# Patient Record
Sex: Female | Born: 1950 | Race: White | Hispanic: No | Marital: Married | Smoking: Former smoker
Health system: Southern US, Community
[De-identification: ages and names within clinical notes are randomized; demographics above are authoritative.]

## PROBLEM LIST (undated history)

## (undated) DIAGNOSIS — N179 Acute kidney failure, unspecified: Secondary | ICD-10-CM

## (undated) DIAGNOSIS — I10 Essential (primary) hypertension: Secondary | ICD-10-CM

## (undated) DIAGNOSIS — F419 Anxiety disorder, unspecified: Secondary | ICD-10-CM

## (undated) DIAGNOSIS — F32A Depression, unspecified: Secondary | ICD-10-CM

## (undated) DIAGNOSIS — N1330 Unspecified hydronephrosis: Secondary | ICD-10-CM

## (undated) DIAGNOSIS — C801 Malignant (primary) neoplasm, unspecified: Secondary | ICD-10-CM

## (undated) DIAGNOSIS — F329 Major depressive disorder, single episode, unspecified: Secondary | ICD-10-CM

## (undated) DIAGNOSIS — D649 Anemia, unspecified: Secondary | ICD-10-CM

## (undated) DIAGNOSIS — C50919 Malignant neoplasm of unspecified site of unspecified female breast: Secondary | ICD-10-CM

## (undated) HISTORY — DX: Anxiety disorder, unspecified: F41.9

## (undated) HISTORY — PX: TONSILLECTOMY: SUR1361

## (undated) HISTORY — DX: Acute kidney failure, unspecified: N17.9

## (undated) HISTORY — PX: APPENDECTOMY: SHX54

## (undated) HISTORY — DX: Malignant neoplasm of unspecified site of unspecified female breast: C50.919

## (undated) HISTORY — PX: SKIN BIOPSY: SHX1

## (undated) HISTORY — DX: Anemia, unspecified: D64.9

---

## 1975-06-18 HISTORY — PX: CHOLECYSTECTOMY: SHX55

## 2005-12-12 ENCOUNTER — Encounter: Admission: RE | Admit: 2005-12-12 | Discharge: 2005-12-12 | Payer: Self-pay | Admitting: Family Medicine

## 2005-12-12 ENCOUNTER — Encounter (INDEPENDENT_AMBULATORY_CARE_PROVIDER_SITE_OTHER): Payer: Self-pay | Admitting: Specialist

## 2005-12-12 ENCOUNTER — Encounter (INDEPENDENT_AMBULATORY_CARE_PROVIDER_SITE_OTHER): Payer: Self-pay | Admitting: Diagnostic Radiology

## 2005-12-20 ENCOUNTER — Encounter: Admission: RE | Admit: 2005-12-20 | Discharge: 2005-12-20 | Payer: Self-pay | Admitting: General Surgery

## 2005-12-27 ENCOUNTER — Encounter (INDEPENDENT_AMBULATORY_CARE_PROVIDER_SITE_OTHER): Payer: Self-pay | Admitting: Diagnostic Radiology

## 2005-12-27 ENCOUNTER — Encounter: Admission: RE | Admit: 2005-12-27 | Discharge: 2005-12-27 | Payer: Self-pay | Admitting: General Surgery

## 2005-12-30 ENCOUNTER — Ambulatory Visit (HOSPITAL_COMMUNITY): Admission: RE | Admit: 2005-12-30 | Discharge: 2005-12-30 | Payer: Self-pay | Admitting: General Surgery

## 2006-01-15 ENCOUNTER — Encounter (INDEPENDENT_AMBULATORY_CARE_PROVIDER_SITE_OTHER): Payer: Self-pay | Admitting: *Deleted

## 2006-01-15 ENCOUNTER — Ambulatory Visit (HOSPITAL_COMMUNITY): Admission: RE | Admit: 2006-01-15 | Discharge: 2006-01-16 | Payer: Self-pay | Admitting: General Surgery

## 2006-01-15 HISTORY — PX: MASTECTOMY MODIFIED RADICAL: SUR848

## 2006-01-29 ENCOUNTER — Ambulatory Visit: Payer: Self-pay | Admitting: Oncology

## 2006-01-29 LAB — COMPREHENSIVE METABOLIC PANEL
ALT: 13 U/L (ref 0–40)
AST: 11 U/L (ref 0–37)
Albumin: 4.4 g/dL (ref 3.5–5.2)
Alkaline Phosphatase: 83 U/L (ref 39–117)
Potassium: 3.9 mEq/L (ref 3.5–5.3)
Sodium: 138 mEq/L (ref 135–145)
Total Bilirubin: 0.7 mg/dL (ref 0.3–1.2)
Total Protein: 6.6 g/dL (ref 6.0–8.3)

## 2006-01-29 LAB — CBC WITH DIFFERENTIAL/PLATELET
EOS%: 1.9 % (ref 0.0–7.0)
HGB: 12 g/dL (ref 11.6–15.9)
MCH: 30.2 pg (ref 26.0–34.0)
MCV: 89 fL (ref 81.0–101.0)
MONO%: 6.9 % (ref 0.0–13.0)
NEUT#: 5.3 10*3/uL (ref 1.5–6.5)
RBC: 3.98 10*6/uL (ref 3.70–5.32)
RDW: 13.1 % (ref 11.3–14.5)
lymph#: 1.5 10*3/uL (ref 0.9–3.3)

## 2006-01-29 LAB — CANCER ANTIGEN 27.29: CA 27.29: 25 U/mL (ref 0–39)

## 2006-02-11 LAB — COMPREHENSIVE METABOLIC PANEL
Alkaline Phosphatase: 75 U/L (ref 39–117)
BUN: 14 mg/dL (ref 6–23)
Creatinine, Ser: 0.97 mg/dL (ref 0.40–1.20)
Glucose, Bld: 111 mg/dL — ABNORMAL HIGH (ref 70–99)
Sodium: 139 mEq/L (ref 135–145)
Total Bilirubin: 0.7 mg/dL (ref 0.3–1.2)

## 2006-02-11 LAB — CBC WITH DIFFERENTIAL/PLATELET
Basophils Absolute: 0 10*3/uL (ref 0.0–0.1)
Eosinophils Absolute: 0.1 10*3/uL (ref 0.0–0.5)
HCT: 35 % (ref 34.8–46.6)
LYMPH%: 25.6 % (ref 14.0–48.0)
MCV: 88.7 fL (ref 81.0–101.0)
MONO%: 9.4 % (ref 0.0–13.0)
NEUT#: 4 10*3/uL (ref 1.5–6.5)
NEUT%: 62.1 % (ref 39.6–76.8)
Platelets: 265 10*3/uL (ref 145–400)
RBC: 3.95 10*6/uL (ref 3.70–5.32)

## 2006-02-11 LAB — CANCER ANTIGEN 27.29: CA 27.29: 19 U/mL (ref 0–39)

## 2006-02-11 LAB — LACTATE DEHYDROGENASE: LDH: 142 U/L (ref 94–250)

## 2006-02-13 ENCOUNTER — Ambulatory Visit (HOSPITAL_COMMUNITY): Admission: RE | Admit: 2006-02-13 | Discharge: 2006-02-13 | Payer: Self-pay | Admitting: Oncology

## 2006-02-20 ENCOUNTER — Encounter (INDEPENDENT_AMBULATORY_CARE_PROVIDER_SITE_OTHER): Payer: Self-pay | Admitting: *Deleted

## 2006-02-20 ENCOUNTER — Ambulatory Visit: Admission: RE | Admit: 2006-02-20 | Discharge: 2006-02-20 | Payer: Self-pay | Admitting: Oncology

## 2006-02-24 LAB — CBC WITH DIFFERENTIAL/PLATELET
Basophils Absolute: 0.1 10*3/uL (ref 0.0–0.1)
HCT: 37.1 % (ref 34.8–46.6)
HGB: 12.7 g/dL (ref 11.6–15.9)
MONO#: 0.5 10*3/uL (ref 0.1–0.9)
NEUT#: 3.3 10*3/uL (ref 1.5–6.5)
NEUT%: 61.1 % (ref 39.6–76.8)
RDW: 12.1 % (ref 11.3–14.5)
WBC: 5.4 10*3/uL (ref 3.9–10.0)
lymph#: 1.4 10*3/uL (ref 0.9–3.3)

## 2006-02-25 ENCOUNTER — Ambulatory Visit: Payer: Self-pay | Admitting: Oncology

## 2006-02-28 ENCOUNTER — Ambulatory Visit (HOSPITAL_COMMUNITY): Admission: RE | Admit: 2006-02-28 | Discharge: 2006-02-28 | Payer: Self-pay | Admitting: Oncology

## 2006-03-04 LAB — CBC WITH DIFFERENTIAL/PLATELET
Basophils Absolute: 0.1 10*3/uL (ref 0.0–0.1)
Eosinophils Absolute: 0.1 10*3/uL (ref 0.0–0.5)
HCT: 36.1 % (ref 34.8–46.6)
HGB: 13 g/dL (ref 11.6–15.9)
LYMPH%: 8.4 % — ABNORMAL LOW (ref 14.0–48.0)
MCV: 85.8 fL (ref 81.0–101.0)
MONO#: 0.4 10*3/uL (ref 0.1–0.9)
MONO%: 1.6 % (ref 0.0–13.0)
NEUT#: 23.7 10*3/uL — ABNORMAL HIGH (ref 1.5–6.5)
NEUT%: 89.4 % — ABNORMAL HIGH (ref 39.6–76.8)
Platelets: 242 10*3/uL (ref 145–400)
WBC: 26.6 10*3/uL — ABNORMAL HIGH (ref 3.9–10.0)

## 2006-03-13 ENCOUNTER — Ambulatory Visit: Payer: Self-pay | Admitting: Oncology

## 2006-03-17 LAB — CBC WITH DIFFERENTIAL/PLATELET
BASO%: 1.6 % (ref 0.0–2.0)
Basophils Absolute: 0.1 10*3/uL (ref 0.0–0.1)
EOS%: 0.8 % (ref 0.0–7.0)
HCT: 32.9 % — ABNORMAL LOW (ref 34.8–46.6)
HGB: 11.4 g/dL — ABNORMAL LOW (ref 11.6–15.9)
LYMPH%: 24.8 % (ref 14.0–48.0)
MCH: 30.7 pg (ref 26.0–34.0)
MCHC: 34.7 g/dL (ref 32.0–36.0)
MCV: 88.6 fL (ref 81.0–101.0)
NEUT%: 62.7 % (ref 39.6–76.8)
Platelets: 290 10*3/uL (ref 145–400)

## 2006-04-14 LAB — CBC WITH DIFFERENTIAL/PLATELET
BASO%: 2.1 % — ABNORMAL HIGH (ref 0.0–2.0)
Basophils Absolute: 0.1 10*3/uL (ref 0.0–0.1)
EOS%: 0.8 % (ref 0.0–7.0)
HGB: 11.3 g/dL — ABNORMAL LOW (ref 11.6–15.9)
MCH: 30.9 pg (ref 26.0–34.0)
MCHC: 35.7 g/dL (ref 32.0–36.0)
MCV: 86.4 fL (ref 81.0–101.0)
MONO%: 25.5 % — ABNORMAL HIGH (ref 0.0–13.0)
NEUT%: 43.8 % (ref 39.6–76.8)
RDW: 13.2 % (ref 11.3–14.5)
lymph#: 1 10*3/uL (ref 0.9–3.3)

## 2006-04-24 ENCOUNTER — Ambulatory Visit: Payer: Self-pay | Admitting: Oncology

## 2006-04-28 LAB — CBC WITH DIFFERENTIAL/PLATELET
BASO%: 1.2 % (ref 0.0–2.0)
EOS%: 0.4 % (ref 0.0–7.0)
HCT: 27.6 % — ABNORMAL LOW (ref 34.8–46.6)
LYMPH%: 19.8 % (ref 14.0–48.0)
MCH: 31.5 pg (ref 26.0–34.0)
MCHC: 35.3 g/dL (ref 32.0–36.0)
NEUT%: 64.9 % (ref 39.6–76.8)
lymph#: 0.9 10*3/uL (ref 0.9–3.3)

## 2006-04-28 LAB — COMPREHENSIVE METABOLIC PANEL
ALT: 35 U/L (ref 0–35)
AST: 26 U/L (ref 0–37)
Chloride: 103 mEq/L (ref 96–112)
Creatinine, Ser: 0.96 mg/dL (ref 0.40–1.20)
Total Bilirubin: 0.8 mg/dL (ref 0.3–1.2)

## 2006-04-29 ENCOUNTER — Ambulatory Visit: Payer: Self-pay | Admitting: Oncology

## 2006-05-05 LAB — CBC WITH DIFFERENTIAL/PLATELET
Basophils Absolute: 0 10*3/uL (ref 0.0–0.1)
Eosinophils Absolute: 0 10*3/uL (ref 0.0–0.5)
HCT: 27.2 % — ABNORMAL LOW (ref 34.8–46.6)
LYMPH%: 28.3 % (ref 14.0–48.0)
MCV: 92.4 fL (ref 81.0–101.0)
MONO#: 1 10*3/uL — ABNORMAL HIGH (ref 0.1–0.9)
NEUT#: 2.7 10*3/uL (ref 1.5–6.5)
NEUT%: 51.4 % (ref 39.6–76.8)
Platelets: 147 10*3/uL (ref 145–400)
WBC: 5.2 10*3/uL (ref 3.9–10.0)

## 2006-05-12 LAB — CBC WITH DIFFERENTIAL/PLATELET
BASO%: 0.8 % (ref 0.0–2.0)
HCT: 28.1 % — ABNORMAL LOW (ref 34.8–46.6)
LYMPH%: 16.7 % (ref 14.0–48.0)
MCH: 31.9 pg (ref 26.0–34.0)
MCHC: 33.6 g/dL (ref 32.0–36.0)
MCV: 95 fL (ref 81.0–101.0)
MONO#: 0.7 10*3/uL (ref 0.1–0.9)
MONO%: 10 % (ref 0.0–13.0)
NEUT%: 72.2 % (ref 39.6–76.8)
Platelets: 126 10*3/uL — ABNORMAL LOW (ref 145–400)

## 2006-05-19 LAB — CBC WITH DIFFERENTIAL/PLATELET
BASO%: 0.6 % (ref 0.0–2.0)
Eosinophils Absolute: 0 10*3/uL (ref 0.0–0.5)
HCT: 29.7 % — ABNORMAL LOW (ref 34.8–46.6)
MCHC: 33.7 g/dL (ref 32.0–36.0)
MONO#: 0.3 10*3/uL (ref 0.1–0.9)
NEUT#: 3.3 10*3/uL (ref 1.5–6.5)
Platelets: 122 10*3/uL — ABNORMAL LOW (ref 145–400)
RBC: 3.04 10*6/uL — ABNORMAL LOW (ref 3.70–5.32)
WBC: 4.9 10*3/uL (ref 3.9–10.0)
lymph#: 1.2 10*3/uL (ref 0.9–3.3)

## 2006-05-19 LAB — COMPREHENSIVE METABOLIC PANEL
ALT: 14 U/L (ref 0–35)
Albumin: 3.8 g/dL (ref 3.5–5.2)
CO2: 24 mEq/L (ref 19–32)
Calcium: 8.4 mg/dL (ref 8.4–10.5)
Chloride: 101 mEq/L (ref 96–112)
Glucose, Bld: 133 mg/dL — ABNORMAL HIGH (ref 70–99)
Sodium: 138 mEq/L (ref 135–145)
Total Protein: 5.7 g/dL — ABNORMAL LOW (ref 6.0–8.3)

## 2006-05-19 LAB — VITAMIN B12: Vitamin B-12: 1987 pg/mL — ABNORMAL HIGH (ref 211–911)

## 2006-05-26 LAB — CBC WITH DIFFERENTIAL/PLATELET
BASO%: 0.5 % (ref 0.0–2.0)
EOS%: 2 % (ref 0.0–7.0)
HCT: 29.4 % — ABNORMAL LOW (ref 34.8–46.6)
MCH: 33.8 pg (ref 26.0–34.0)
MCHC: 33.8 g/dL (ref 32.0–36.0)
MONO#: 0.4 10*3/uL (ref 0.1–0.9)
NEUT%: 58.2 % (ref 39.6–76.8)
RBC: 2.94 10*6/uL — ABNORMAL LOW (ref 3.70–5.32)
RDW: 21.3 % — ABNORMAL HIGH (ref 11.3–14.5)
WBC: 3.5 10*3/uL — ABNORMAL LOW (ref 3.9–10.0)
lymph#: 1 10*3/uL (ref 0.9–3.3)

## 2006-06-04 ENCOUNTER — Ambulatory Visit: Admission: RE | Admit: 2006-06-04 | Discharge: 2006-08-14 | Payer: Self-pay | Admitting: Radiation Oncology

## 2006-06-11 ENCOUNTER — Ambulatory Visit: Payer: Self-pay | Admitting: Oncology

## 2006-06-12 LAB — CBC WITH DIFFERENTIAL/PLATELET
Basophils Absolute: 0 10*3/uL (ref 0.0–0.1)
Eosinophils Absolute: 0.1 10*3/uL (ref 0.0–0.5)
HGB: 10.7 g/dL — ABNORMAL LOW (ref 11.6–15.9)
MONO#: 0.4 10*3/uL (ref 0.1–0.9)
NEUT#: 2 10*3/uL (ref 1.5–6.5)
Platelets: 237 10*3/uL (ref 145–400)
RBC: 3.27 10*6/uL — ABNORMAL LOW (ref 3.70–5.32)
RDW: 13 % (ref 11.3–14.5)
WBC: 3.9 10*3/uL (ref 3.9–10.0)

## 2006-06-12 LAB — COMPREHENSIVE METABOLIC PANEL
ALT: 36 U/L — ABNORMAL HIGH (ref 0–35)
AST: 26 U/L (ref 0–37)
Calcium: 8.5 mg/dL (ref 8.4–10.5)
Chloride: 104 mEq/L (ref 96–112)
Creatinine, Ser: 0.88 mg/dL (ref 0.40–1.20)
Sodium: 142 mEq/L (ref 135–145)
Total Protein: 5.8 g/dL — ABNORMAL LOW (ref 6.0–8.3)

## 2006-07-03 LAB — CBC WITH DIFFERENTIAL/PLATELET
BASO%: 1.1 % (ref 0.0–2.0)
HCT: 34 % — ABNORMAL LOW (ref 34.8–46.6)
LYMPH%: 20.7 % (ref 14.0–48.0)
MCHC: 34.8 g/dL (ref 32.0–36.0)
MCV: 94.4 fL (ref 81.0–101.0)
MONO#: 0.4 10*3/uL (ref 0.1–0.9)
MONO%: 8.2 % (ref 0.0–13.0)
NEUT%: 66.6 % (ref 39.6–76.8)
Platelets: 259 10*3/uL (ref 145–400)
RBC: 3.6 10*6/uL — ABNORMAL LOW (ref 3.70–5.32)
WBC: 4.7 10*3/uL (ref 3.9–10.0)

## 2006-07-18 LAB — COMPREHENSIVE METABOLIC PANEL
ALT: 22 U/L (ref 0–35)
AST: 18 U/L (ref 0–37)
Alkaline Phosphatase: 82 U/L (ref 39–117)
BUN: 15 mg/dL (ref 6–23)
Creatinine, Ser: 1.02 mg/dL (ref 0.40–1.20)
Total Bilirubin: 0.8 mg/dL (ref 0.3–1.2)

## 2006-07-18 LAB — CANCER ANTIGEN 27.29: CA 27.29: 29 U/mL (ref 0–39)

## 2006-07-18 LAB — CBC WITH DIFFERENTIAL/PLATELET
BASO%: 0.7 % (ref 0.0–2.0)
EOS%: 5.5 % (ref 0.0–7.0)
HCT: 33.1 % — ABNORMAL LOW (ref 34.8–46.6)
LYMPH%: 18 % (ref 14.0–48.0)
MCH: 33.2 pg (ref 26.0–34.0)
MCHC: 35.5 g/dL (ref 32.0–36.0)
NEUT%: 62.6 % (ref 39.6–76.8)
Platelets: 192 10*3/uL (ref 145–400)
RBC: 3.55 10*6/uL — ABNORMAL LOW (ref 3.70–5.32)
WBC: 2.9 10*3/uL — ABNORMAL LOW (ref 3.9–10.0)
lymph#: 0.5 10*3/uL — ABNORMAL LOW (ref 0.9–3.3)

## 2006-07-21 ENCOUNTER — Ambulatory Visit: Payer: Self-pay | Admitting: Oncology

## 2006-07-22 ENCOUNTER — Ambulatory Visit (HOSPITAL_COMMUNITY): Admission: RE | Admit: 2006-07-22 | Discharge: 2006-07-22 | Payer: Self-pay | Admitting: Oncology

## 2006-07-29 ENCOUNTER — Ambulatory Visit: Admission: RE | Admit: 2006-07-29 | Discharge: 2006-07-29 | Payer: Self-pay | Admitting: Oncology

## 2006-07-29 ENCOUNTER — Encounter (INDEPENDENT_AMBULATORY_CARE_PROVIDER_SITE_OTHER): Payer: Self-pay | Admitting: Cardiology

## 2006-08-01 LAB — ESTRADIOL, ULTRA SENS: Estradiol, Ultra Sensitive: 13 pg/mL

## 2006-09-02 ENCOUNTER — Ambulatory Visit: Payer: Self-pay | Admitting: Oncology

## 2006-09-25 LAB — CBC WITH DIFFERENTIAL/PLATELET
BASO%: 0.9 % (ref 0.0–2.0)
EOS%: 4.5 % (ref 0.0–7.0)
HCT: 32.2 % — ABNORMAL LOW (ref 34.8–46.6)
LYMPH%: 19 % (ref 14.0–48.0)
MCH: 31 pg (ref 26.0–34.0)
MCHC: 34.9 g/dL (ref 32.0–36.0)
MONO#: 0.3 10*3/uL (ref 0.1–0.9)
NEUT%: 66.7 % (ref 39.6–76.8)
Platelets: 280 10*3/uL (ref 145–400)
RBC: 3.62 10*6/uL — ABNORMAL LOW (ref 3.70–5.32)
WBC: 3.8 10*3/uL — ABNORMAL LOW (ref 3.9–10.0)

## 2006-09-25 LAB — FOLLICLE STIMULATING HORMONE: FSH: 75.9 m[IU]/mL

## 2006-10-14 ENCOUNTER — Ambulatory Visit: Payer: Self-pay | Admitting: Oncology

## 2006-10-16 LAB — CBC WITH DIFFERENTIAL/PLATELET
BASO%: 0.9 % (ref 0.0–2.0)
EOS%: 6.8 % (ref 0.0–7.0)
HCT: 33.3 % — ABNORMAL LOW (ref 34.8–46.6)
MCH: 30.5 pg (ref 26.0–34.0)
MCHC: 34.3 g/dL (ref 32.0–36.0)
NEUT%: 46.7 % (ref 39.6–76.8)
RBC: 3.75 10*6/uL (ref 3.70–5.32)
lymph#: 0.7 10*3/uL — ABNORMAL LOW (ref 0.9–3.3)

## 2006-11-11 ENCOUNTER — Ambulatory Visit: Admission: RE | Admit: 2006-11-11 | Discharge: 2006-11-11 | Payer: Self-pay | Admitting: Oncology

## 2006-11-11 ENCOUNTER — Encounter: Payer: Self-pay | Admitting: Oncology

## 2006-12-15 ENCOUNTER — Encounter: Admission: RE | Admit: 2006-12-15 | Discharge: 2006-12-15 | Payer: Self-pay | Admitting: Oncology

## 2006-12-16 ENCOUNTER — Ambulatory Visit: Payer: Self-pay | Admitting: Oncology

## 2006-12-18 LAB — FOLLICLE STIMULATING HORMONE: FSH: 90.8 m[IU]/mL

## 2007-01-28 ENCOUNTER — Ambulatory Visit: Payer: Self-pay | Admitting: Oncology

## 2007-01-29 LAB — CBC WITH DIFFERENTIAL/PLATELET
Eosinophils Absolute: 0.2 10*3/uL (ref 0.0–0.5)
HCT: 33.7 % — ABNORMAL LOW (ref 34.8–46.6)
LYMPH%: 25.2 % (ref 14.0–48.0)
MONO#: 0.4 10*3/uL (ref 0.1–0.9)
NEUT#: 2.7 10*3/uL (ref 1.5–6.5)
NEUT%: 60.8 % (ref 39.6–76.8)
Platelets: 241 10*3/uL (ref 145–400)
RBC: 3.82 10*6/uL (ref 3.70–5.32)
WBC: 4.4 10*3/uL (ref 3.9–10.0)
lymph#: 1.1 10*3/uL (ref 0.9–3.3)

## 2007-01-30 LAB — FOLLICLE STIMULATING HORMONE: FSH: 99.5 m[IU]/mL

## 2007-02-09 ENCOUNTER — Ambulatory Visit (HOSPITAL_COMMUNITY): Admission: RE | Admit: 2007-02-09 | Discharge: 2007-02-09 | Payer: Self-pay | Admitting: Oncology

## 2007-02-09 LAB — CBC WITH DIFFERENTIAL/PLATELET
Basophils Absolute: 0 10*3/uL (ref 0.0–0.1)
Eosinophils Absolute: 0.2 10*3/uL (ref 0.0–0.5)
HGB: 11.9 g/dL (ref 11.6–15.9)
MCV: 89.1 fL (ref 81.0–101.0)
MONO#: 0.4 10*3/uL (ref 0.1–0.9)
MONO%: 8.3 % (ref 0.0–13.0)
NEUT#: 2.8 10*3/uL (ref 1.5–6.5)
RBC: 3.72 10*6/uL (ref 3.70–5.32)
RDW: 13.6 % (ref 11.3–14.5)
WBC: 4.6 10*3/uL (ref 3.9–10.0)
lymph#: 1.3 10*3/uL (ref 0.9–3.3)

## 2007-02-09 LAB — COMPREHENSIVE METABOLIC PANEL
Albumin: 4.5 g/dL (ref 3.5–5.2)
BUN: 16 mg/dL (ref 6–23)
CO2: 25 mEq/L (ref 19–32)
Calcium: 10 mg/dL (ref 8.4–10.5)
Chloride: 100 mEq/L (ref 96–112)
Glucose, Bld: 99 mg/dL (ref 70–99)
Potassium: 4.1 mEq/L (ref 3.5–5.3)
Sodium: 139 mEq/L (ref 135–145)
Total Protein: 7.3 g/dL (ref 6.0–8.3)

## 2007-02-09 LAB — CANCER ANTIGEN 27.29: CA 27.29: 25 U/mL (ref 0–39)

## 2007-02-09 LAB — FOLLICLE STIMULATING HORMONE: FSH: 96.1 m[IU]/mL

## 2007-02-10 ENCOUNTER — Ambulatory Visit: Admission: RE | Admit: 2007-02-10 | Discharge: 2007-02-10 | Payer: Self-pay | Admitting: Oncology

## 2007-02-10 ENCOUNTER — Encounter: Payer: Self-pay | Admitting: Oncology

## 2007-02-19 LAB — ESTRADIOL, ULTRA SENS: Estradiol, Ultra Sensitive: 5 pg/mL

## 2007-04-06 ENCOUNTER — Ambulatory Visit (HOSPITAL_BASED_OUTPATIENT_CLINIC_OR_DEPARTMENT_OTHER): Admission: RE | Admit: 2007-04-06 | Discharge: 2007-04-06 | Payer: Self-pay | Admitting: General Surgery

## 2007-08-05 ENCOUNTER — Ambulatory Visit: Payer: Self-pay | Admitting: Oncology

## 2007-08-10 LAB — CBC WITH DIFFERENTIAL/PLATELET
Basophils Absolute: 0 10*3/uL (ref 0.0–0.1)
EOS%: 3.7 % (ref 0.0–7.0)
Eosinophils Absolute: 0.2 10*3/uL (ref 0.0–0.5)
HGB: 12.9 g/dL (ref 11.6–15.9)
MONO#: 0.5 10*3/uL (ref 0.1–0.9)
NEUT#: 2.9 10*3/uL (ref 1.5–6.5)
RDW: 13.4 % (ref 11.3–14.5)
WBC: 5.3 10*3/uL (ref 3.9–10.0)
lymph#: 1.7 10*3/uL (ref 0.9–3.3)

## 2007-08-10 LAB — COMPREHENSIVE METABOLIC PANEL
AST: 20 U/L (ref 0–37)
Albumin: 4.4 g/dL (ref 3.5–5.2)
BUN: 14 mg/dL (ref 6–23)
CO2: 22 mEq/L (ref 19–32)
Calcium: 9.4 mg/dL (ref 8.4–10.5)
Chloride: 105 mEq/L (ref 96–112)
Glucose, Bld: 121 mg/dL — ABNORMAL HIGH (ref 70–99)
Potassium: 4 mEq/L (ref 3.5–5.3)

## 2007-08-25 ENCOUNTER — Ambulatory Visit: Payer: Self-pay | Admitting: Oncology

## 2007-09-07 LAB — ESTRADIOL, ULTRA SENS: Estradiol, Ultra Sensitive: 8 pg/mL

## 2007-12-17 ENCOUNTER — Encounter: Admission: RE | Admit: 2007-12-17 | Discharge: 2007-12-17 | Payer: Self-pay | Admitting: Oncology

## 2008-02-19 ENCOUNTER — Ambulatory Visit: Payer: Self-pay | Admitting: Oncology

## 2008-02-25 ENCOUNTER — Ambulatory Visit (HOSPITAL_COMMUNITY): Admission: RE | Admit: 2008-02-25 | Discharge: 2008-02-25 | Payer: Self-pay | Admitting: Oncology

## 2008-02-25 LAB — CBC WITH DIFFERENTIAL/PLATELET
Basophils Absolute: 0 10*3/uL (ref 0.0–0.1)
Eosinophils Absolute: 0.1 10*3/uL (ref 0.0–0.5)
HGB: 12.4 g/dL (ref 11.6–15.9)
MCV: 89.9 fL (ref 81.0–101.0)
MONO#: 0.3 10*3/uL (ref 0.1–0.9)
NEUT#: 2.3 10*3/uL (ref 1.5–6.5)
RDW: 13.9 % (ref 11.3–14.5)
WBC: 4 10*3/uL (ref 3.9–10.0)
lymph#: 1.2 10*3/uL (ref 0.9–3.3)

## 2008-02-25 LAB — COMPREHENSIVE METABOLIC PANEL
Albumin: 3.5 g/dL (ref 3.5–5.2)
BUN: 11 mg/dL (ref 6–23)
Calcium: 9.6 mg/dL (ref 8.4–10.5)
Chloride: 106 mEq/L (ref 96–112)
Glucose, Bld: 115 mg/dL — ABNORMAL HIGH (ref 70–99)
Potassium: 4.3 mEq/L (ref 3.5–5.3)
Sodium: 140 mEq/L (ref 135–145)
Total Protein: 6.5 g/dL (ref 6.0–8.3)

## 2008-02-25 LAB — CANCER ANTIGEN 27.29: CA 27.29: 21 U/mL (ref 0–39)

## 2008-09-01 ENCOUNTER — Ambulatory Visit: Payer: Self-pay | Admitting: Oncology

## 2008-09-23 LAB — CBC WITH DIFFERENTIAL/PLATELET
BASO%: 0.6 % (ref 0.0–2.0)
EOS%: 2.8 % (ref 0.0–7.0)
HCT: 39.2 % (ref 34.8–46.6)
MCH: 30.4 pg (ref 25.1–34.0)
MCHC: 34.3 g/dL (ref 31.5–36.0)
MONO#: 0.2 10*3/uL (ref 0.1–0.9)
RBC: 4.43 10*6/uL (ref 3.70–5.45)
RDW: 13.4 % (ref 11.2–14.5)
WBC: 4.9 10*3/uL (ref 3.9–10.3)
lymph#: 1.4 10*3/uL (ref 0.9–3.3)

## 2008-09-26 LAB — COMPREHENSIVE METABOLIC PANEL
ALT: 25 U/L (ref 0–35)
AST: 19 U/L (ref 0–37)
CO2: 25 mEq/L (ref 19–32)
Calcium: 9.8 mg/dL (ref 8.4–10.5)
Chloride: 101 mEq/L (ref 96–112)
Creatinine, Ser: 1.04 mg/dL (ref 0.40–1.20)
Potassium: 4.3 mEq/L (ref 3.5–5.3)
Sodium: 138 mEq/L (ref 135–145)
Total Protein: 6.9 g/dL (ref 6.0–8.3)

## 2008-10-02 LAB — ESTRADIOL, ULTRA SENS

## 2008-12-20 ENCOUNTER — Encounter: Admission: RE | Admit: 2008-12-20 | Discharge: 2008-12-20 | Payer: Self-pay | Admitting: Oncology

## 2009-02-21 ENCOUNTER — Ambulatory Visit: Payer: Self-pay | Admitting: Oncology

## 2009-02-23 LAB — CBC WITH DIFFERENTIAL/PLATELET
Basophils Absolute: 0 10*3/uL (ref 0.0–0.1)
EOS%: 1.8 % (ref 0.0–7.0)
HCT: 36.6 % (ref 34.8–46.6)
HGB: 12.6 g/dL (ref 11.6–15.9)
LYMPH%: 23.7 % (ref 14.0–49.7)
MCH: 30.8 pg (ref 25.1–34.0)
MCV: 89.6 fL (ref 79.5–101.0)
MONO%: 6.4 % (ref 0.0–14.0)
NEUT%: 67.4 % (ref 38.4–76.8)
Platelets: 214 10*3/uL (ref 145–400)
RDW: 13.3 % (ref 11.2–14.5)

## 2009-02-24 LAB — COMPREHENSIVE METABOLIC PANEL
AST: 16 U/L (ref 0–37)
Alkaline Phosphatase: 140 U/L — ABNORMAL HIGH (ref 39–117)
BUN: 20 mg/dL (ref 6–23)
Creatinine, Ser: 1.24 mg/dL — ABNORMAL HIGH (ref 0.40–1.20)
Total Bilirubin: 0.8 mg/dL (ref 0.3–1.2)

## 2009-02-24 LAB — VITAMIN D 25 HYDROXY (VIT D DEFICIENCY, FRACTURES): Vit D, 25-Hydroxy: 26 ng/mL — ABNORMAL LOW (ref 30–89)

## 2009-02-24 LAB — FOLLICLE STIMULATING HORMONE: FSH: 82.8 m[IU]/mL

## 2009-02-24 LAB — CANCER ANTIGEN 27.29: CA 27.29: 30 U/mL (ref 0–39)

## 2009-03-03 LAB — ESTRADIOL, ULTRA SENS

## 2009-08-24 ENCOUNTER — Ambulatory Visit: Payer: Self-pay | Admitting: Oncology

## 2009-08-28 LAB — CBC WITH DIFFERENTIAL/PLATELET
BASO%: 0.8 % (ref 0.0–2.0)
EOS%: 2.9 % (ref 0.0–7.0)
HCT: 39.1 % (ref 34.8–46.6)
LYMPH%: 38.1 % (ref 14.0–49.7)
MCH: 31.4 pg (ref 25.1–34.0)
MCHC: 34.4 g/dL (ref 31.5–36.0)
MONO%: 9.7 % (ref 0.0–14.0)
NEUT%: 48.5 % (ref 38.4–76.8)
Platelets: 243 10*3/uL (ref 145–400)
RBC: 4.28 10*6/uL (ref 3.70–5.45)

## 2009-08-28 LAB — COMPREHENSIVE METABOLIC PANEL
ALT: 21 U/L (ref 0–35)
AST: 17 U/L (ref 0–37)
Alkaline Phosphatase: 120 U/L — ABNORMAL HIGH (ref 39–117)
Creatinine, Ser: 1.01 mg/dL (ref 0.40–1.20)
Sodium: 139 mEq/L (ref 135–145)
Total Bilirubin: 0.8 mg/dL (ref 0.3–1.2)

## 2009-08-28 LAB — CANCER ANTIGEN 27.29: CA 27.29: 29 U/mL (ref 0–39)

## 2010-01-08 ENCOUNTER — Encounter: Admission: RE | Admit: 2010-01-08 | Discharge: 2010-01-08 | Payer: Self-pay | Admitting: Oncology

## 2010-02-23 ENCOUNTER — Ambulatory Visit: Payer: Self-pay | Admitting: Oncology

## 2010-07-08 ENCOUNTER — Encounter: Payer: Self-pay | Admitting: Oncology

## 2010-07-09 ENCOUNTER — Encounter: Payer: Self-pay | Admitting: Oncology

## 2010-08-29 ENCOUNTER — Encounter (HOSPITAL_BASED_OUTPATIENT_CLINIC_OR_DEPARTMENT_OTHER): Payer: PRIVATE HEALTH INSURANCE | Admitting: Oncology

## 2010-08-29 ENCOUNTER — Other Ambulatory Visit: Payer: Self-pay | Admitting: Oncology

## 2010-08-29 DIAGNOSIS — C50919 Malignant neoplasm of unspecified site of unspecified female breast: Secondary | ICD-10-CM

## 2010-08-29 DIAGNOSIS — Z170 Estrogen receptor positive status [ER+]: Secondary | ICD-10-CM

## 2010-08-29 LAB — CBC WITH DIFFERENTIAL/PLATELET
Basophils Absolute: 0 10*3/uL (ref 0.0–0.1)
Eosinophils Absolute: 0.2 10*3/uL (ref 0.0–0.5)
HCT: 36.4 % (ref 34.8–46.6)
HGB: 12.5 g/dL (ref 11.6–15.9)
MCV: 88.6 fL (ref 79.5–101.0)
MONO%: 11.3 % (ref 0.0–14.0)
NEUT#: 2.7 10*3/uL (ref 1.5–6.5)
NEUT%: 49.2 % (ref 38.4–76.8)
Platelets: 218 10*3/uL (ref 145–400)
RDW: 14.2 % (ref 11.2–14.5)

## 2010-08-30 LAB — COMPREHENSIVE METABOLIC PANEL
Albumin: 4.5 g/dL (ref 3.5–5.2)
Alkaline Phosphatase: 113 U/L (ref 39–117)
BUN: 11 mg/dL (ref 6–23)
Calcium: 9 mg/dL (ref 8.4–10.5)
Glucose, Bld: 72 mg/dL (ref 70–99)
Potassium: 3.7 mEq/L (ref 3.5–5.3)

## 2010-08-30 LAB — VITAMIN D 25 HYDROXY (VIT D DEFICIENCY, FRACTURES): Vit D, 25-Hydroxy: 11 ng/mL — ABNORMAL LOW (ref 30–89)

## 2010-09-04 ENCOUNTER — Other Ambulatory Visit: Payer: Self-pay | Admitting: Oncology

## 2010-09-04 ENCOUNTER — Encounter (HOSPITAL_BASED_OUTPATIENT_CLINIC_OR_DEPARTMENT_OTHER): Payer: PRIVATE HEALTH INSURANCE | Admitting: Oncology

## 2010-09-04 DIAGNOSIS — Z170 Estrogen receptor positive status [ER+]: Secondary | ICD-10-CM

## 2010-09-04 DIAGNOSIS — C50919 Malignant neoplasm of unspecified site of unspecified female breast: Secondary | ICD-10-CM

## 2010-09-04 DIAGNOSIS — Z853 Personal history of malignant neoplasm of breast: Secondary | ICD-10-CM

## 2010-09-04 DIAGNOSIS — Z9010 Acquired absence of unspecified breast and nipple: Secondary | ICD-10-CM

## 2010-10-01 ENCOUNTER — Other Ambulatory Visit: Payer: Self-pay | Admitting: Dermatology

## 2010-10-18 ENCOUNTER — Encounter (HOSPITAL_BASED_OUTPATIENT_CLINIC_OR_DEPARTMENT_OTHER): Payer: PRIVATE HEALTH INSURANCE | Admitting: Oncology

## 2010-10-18 ENCOUNTER — Other Ambulatory Visit: Payer: Self-pay | Admitting: Oncology

## 2010-10-18 DIAGNOSIS — C50919 Malignant neoplasm of unspecified site of unspecified female breast: Secondary | ICD-10-CM

## 2010-10-18 DIAGNOSIS — Z170 Estrogen receptor positive status [ER+]: Secondary | ICD-10-CM

## 2010-10-18 LAB — COMPREHENSIVE METABOLIC PANEL
ALT: 23 U/L (ref 0–35)
AST: 20 U/L (ref 0–37)
Albumin: 4.5 g/dL (ref 3.5–5.2)
Alkaline Phosphatase: 105 U/L (ref 39–117)
BUN: 14 mg/dL (ref 6–23)
CO2: 23 mEq/L (ref 19–32)
Calcium: 9 mg/dL (ref 8.4–10.5)
Chloride: 105 mEq/L (ref 96–112)
Creatinine, Ser: 0.91 mg/dL (ref 0.40–1.20)
Glucose, Bld: 111 mg/dL — ABNORMAL HIGH (ref 70–99)
Potassium: 4.1 mEq/L (ref 3.5–5.3)
Sodium: 139 mEq/L (ref 135–145)
Total Bilirubin: 0.7 mg/dL (ref 0.3–1.2)
Total Protein: 6.9 g/dL (ref 6.0–8.3)

## 2010-10-22 ENCOUNTER — Other Ambulatory Visit: Payer: Self-pay | Admitting: Oncology

## 2010-10-22 DIAGNOSIS — C50919 Malignant neoplasm of unspecified site of unspecified female breast: Secondary | ICD-10-CM

## 2010-10-30 NOTE — Op Note (Signed)
[name], Pugh               ACCOUNT NO.:  [id number]   MEDICAL RECORD NO.:  [id number]          PATIENT TYPE:  AMB   LOCATION:  DSC                          FACILITY:  [facility]   PHYSICIAN:  Angelia Mould. Derrell Lolling, M.D.DATE OF BIRTH:  July 28, 1950   DATE OF PROCEDURE:  04/06/2007  DATE OF DISCHARGE:  04/06/2007                               OPERATIVE REPORT   PREOPERATIVE DIAGNOSIS:  Cancer of the left breast.   POSTOPERATIVE DIAGNOSIS:  Cancer of the left breast.   OPERATION PERFORMED:  Removal of Port-A-Cath.   SURGEON:  Dr. [name].   OPERATIVE INDICATIONS:  This is a 60 year old white female who underwent  left modified radical mastectomy and insertion of Port-A-Cath on January 15, 2006.  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  She has had chemotherapy as well as adjuvant  radiation therapy.  She was referred back to me by [name] for  removal of port.  She has no evidence of recurrence at this time.  The  patient has been counseled as an outpatient regarding this procedure.   OPERATIVE TECHNIQUE:  The patient was brought to the operating room.  She was sedated and monitored by anesthesia department.  The right upper  anterior chest was prepped and draped in sterile fashion.  The patient  was identified as to correct patient and correct procedure and correct  site.  Intravenous antibiotics were given.  1% Xylocaine with  epinephrine was used as local infiltration anesthetic.  Transverse  incision was made in the right infraclavicular area, through the  previous scar.  Dissection was carried down through subcutaneous tissue  until I encountered the capsule around the port.  The capsule was  dissected away from the port and the catheter.  Two Prolene sutures  holding the port in place were cut and removed.  The port and the  catheter was then dissected away and removed intact.  The entire  catheter looked good.  The tip  of the catheter was smoothly rounded  suggesting complete removal.  The wound was irrigated with saline.  There  was no bleeding.  Subcutaneous tissue was closed with interrupted  sutures of 3-0 Vicryl.  Skin closed with a running subcuticular suture  of 4-0 Monocryl and Dermabond dressing.  The patient was taken to  recovery room in stable condition.  Estimated blood loss was about 5-10  mL.  Complications none.  Sponge and needle and instrument counts were  correct.      Angelia Mould. Derrell Lolling, M.D.  Electronically Signed     HMI/MEDQ  D:  04/06/2007  T:  04/07/2007  Job:  [id number]   cc:   Valentino Hue. Magrinat, M.D.

## 2010-11-01 ENCOUNTER — Encounter (HOSPITAL_COMMUNITY)
Admission: RE | Admit: 2010-11-01 | Discharge: 2010-11-01 | Disposition: A | Discharge status: Home / Self Care | Payer: PRIVATE HEALTH INSURANCE | Source: Ambulatory Visit | Attending: Oncology | Admitting: Oncology

## 2010-11-01 ENCOUNTER — Ambulatory Visit (HOSPITAL_COMMUNITY): Payer: PRIVATE HEALTH INSURANCE

## 2010-11-01 ENCOUNTER — Other Ambulatory Visit: Payer: Self-pay | Admitting: Oncology

## 2010-11-01 ENCOUNTER — Encounter (HOSPITAL_COMMUNITY): Payer: Self-pay

## 2010-11-01 ENCOUNTER — Ambulatory Visit (HOSPITAL_COMMUNITY)
Admission: RE | Admit: 2010-11-01 | Discharge: 2010-11-01 | Disposition: A | Discharge status: Home / Self Care | Payer: PRIVATE HEALTH INSURANCE | Source: Ambulatory Visit | Attending: Oncology | Admitting: Oncology

## 2010-11-01 DIAGNOSIS — J069 Acute upper respiratory infection, unspecified: Secondary | ICD-10-CM | POA: Insufficient documentation

## 2010-11-01 DIAGNOSIS — K7689 Other specified diseases of liver: Secondary | ICD-10-CM | POA: Insufficient documentation

## 2010-11-01 DIAGNOSIS — C7970 Secondary malignant neoplasm of unspecified adrenal gland: Secondary | ICD-10-CM | POA: Insufficient documentation

## 2010-11-01 DIAGNOSIS — C50919 Malignant neoplasm of unspecified site of unspecified female breast: Secondary | ICD-10-CM

## 2010-11-01 DIAGNOSIS — C786 Secondary malignant neoplasm of retroperitoneum and peritoneum: Secondary | ICD-10-CM | POA: Insufficient documentation

## 2010-11-01 HISTORY — DX: Malignant (primary) neoplasm, unspecified: C80.1

## 2010-11-01 MED ORDER — IOHEXOL 300 MG/ML  SOLN
80.0000 mL | Freq: Once | INTRAMUSCULAR | Status: AC | PRN
  Administered 2010-11-01: 80 mL via INTRAVENOUS

## 2010-11-01 MED ORDER — TECHNETIUM TC 99M MEDRONATE IV KIT: 25.0000 | PACK | Freq: Once | INTRAVENOUS | Status: DC | PRN

## 2010-11-05 ENCOUNTER — Other Ambulatory Visit: Payer: Self-pay | Admitting: Oncology

## 2010-11-05 ENCOUNTER — Encounter (HOSPITAL_COMMUNITY): Payer: Self-pay

## 2010-11-05 ENCOUNTER — Encounter (HOSPITAL_BASED_OUTPATIENT_CLINIC_OR_DEPARTMENT_OTHER): Payer: PRIVATE HEALTH INSURANCE | Admitting: Oncology

## 2010-11-05 ENCOUNTER — Encounter (HOSPITAL_COMMUNITY)
Admission: RE | Admit: 2010-11-05 | Discharge: 2010-11-05 | Disposition: A | Discharge status: Home / Self Care | Payer: PRIVATE HEALTH INSURANCE | Source: Ambulatory Visit | Attending: Oncology | Admitting: Oncology

## 2010-11-05 DIAGNOSIS — C50919 Malignant neoplasm of unspecified site of unspecified female breast: Secondary | ICD-10-CM

## 2010-11-05 DIAGNOSIS — E559 Vitamin D deficiency, unspecified: Secondary | ICD-10-CM

## 2010-11-05 DIAGNOSIS — Z170 Estrogen receptor positive status [ER+]: Secondary | ICD-10-CM

## 2010-11-05 LAB — CBC WITH DIFFERENTIAL/PLATELET
BASO%: 0.7 % (ref 0.0–2.0)
Basophils Absolute: 0 10*3/uL (ref 0.0–0.1)
HCT: 36.5 % (ref 34.8–46.6)
HGB: 12.5 g/dL (ref 11.6–15.9)
MCHC: 34.3 g/dL (ref 31.5–36.0)
MONO#: 0.4 10*3/uL (ref 0.1–0.9)
NEUT#: 2.5 10*3/uL (ref 1.5–6.5)
NEUT%: 56.4 % (ref 38.4–76.8)
WBC: 4.5 10*3/uL (ref 3.9–10.3)
lymph#: 1.4 10*3/uL (ref 0.9–3.3)

## 2010-11-05 LAB — CMP (CANCER CENTER ONLY)
ALT(SGPT): 35 U/L (ref 10–47)
CO2: 26 mEq/L (ref 18–33)
Calcium: 8.8 mg/dL (ref 8.0–10.3)
Chloride: 99 mEq/L (ref 98–108)
Creat: 0.8 mg/dl (ref 0.6–1.2)

## 2010-11-05 LAB — CA 125: CA 125: 9.2 U/mL (ref 0.0–30.2)

## 2010-11-05 LAB — CANCER ANTIGEN 27.29: CA 27.29: 54 U/mL — ABNORMAL HIGH (ref 0–39)

## 2010-11-05 MED ORDER — FLUDEOXYGLUCOSE F - 18 (FDG) INJECTION
17.6000 | Freq: Once | INTRAVENOUS | Status: AC | PRN
  Administered 2010-11-05: 17.6 via INTRAVENOUS

## 2010-11-05 MED ORDER — IOHEXOL 300 MG/ML  SOLN
100.0000 mL | Freq: Once | INTRAMUSCULAR | Status: AC | PRN
  Administered 2010-11-05: 100 mL via INTRAVENOUS

## 2010-11-06 NOTE — Op Note (Signed)
Rebecca Pugh, Rebecca Pugh               ACCOUNT NO.:  [id number]   MEDICAL RECORD NO.:  [id number]          PATIENT TYPE:  OIB   LOCATION:  2550                         FACILITY:  [facility]   PHYSICIAN:  Angelia Mould. Derrell Lolling, M.D.DATE OF BIRTH:  1950/07/18   DATE OF PROCEDURE:  01/15/2006  DATE OF DISCHARGE:                                 OPERATIVE REPORT   PREOPERATIVE DIAGNOSIS:  Carcinoma of the left breast.   POSTOPERATIVE DIAGNOSIS:  Carcinoma of the left breast.   OPERATION PERFORMED:  1.  Left modified radical mastectomy.  2.  Insertion of Bard port  venous vascular access device.   SURGEON:  Angelia Mould. Derrell Lolling, M.D.   FIRST ASSISTANT:  Leonie Man, M.D.   OPERATIVE INDICATIONS:  This is a 60 year old white female who was seen  recently because of a progressive mass in the lateral left breast or  anterior left axilla.  She has had mammograms, ultrasounds MRI and biopsies.  The imaging shows a 6 centimeter left axillary mass anteriorly, which has  been biopsied and shows metastatic mammary carcinoma ER and PR positive and  HER-2 negative.  MRI suggested a 7 mm left supraclavicular node and clumped  nodular enhancement in the central and superior breast consistent with  fairly diffuse breast cancer.  The right breast and right axilla looked  normal.  A PET scan showed that there was no abnormality outside of the  breast and left axilla, specifically the supraclavicular area looked normal.  Her case has been discussed in breast multidisciplinary conference and  specifically I have discussed it with Raymond Gurney C. Magrinat, M.D.  We do not  feel that neoadjuvant therapy will offer any benefit and [name] and I  felt that the best course at this time was to proceed with modified radical  mastectomy and insertion of a Port-A-Cath for the intended chemotherapy.  The patient has been told that she will probably need radiation therapy as  well.   OPERATIVE TECHNIQUE:  Following  induction of general endotracheal  anesthesia, the neck and left breast and left chest wall and left axilla  were prepped and draped in a sterile fashion.  A large mobile palpable mass  in the anterior left axilla or perhaps in the tail of Spence was palpable.  A transverse elliptical incision was made to excise the nipple and areolar  complex.  Skin flaps were raised superiorly to just below the clavicle,  medially to the parasternal area, inferiorly to the rectus sheath, and  laterally to the latissimus dorsi muscle.  In the left axilla we traversed  in the skin while raising the flaps where we we close to the palpable mass,  creating about a 2.0 cm skin incision which had to be closed with staples  later.  After all skin flaps were raised, we dissected the breast off of the  pectoralis major and minor muscle with electrocautery.  We then carefully  incised the clavipectoral fascia laterally.  Venous channels and a couple of  sensory nerves were controlled with metal clips and divided.  We were very  careful to get around  the large mass in the anterior left axilla, and we  were able to get all the way around this without involving any of the  margins of resection.  Dissection was carried up in the axilla until we  identified the left axillary vein.  Some venous tributaries to the axillary  vein were controlled with multiple metal clips and divided.  We dissected  out most of the axillary contents below the axillary vein between the chest  wall and the thoracodorsal neurovascular bundle.  We identified the long  thoracic nerve and we identified the thoracodorsal neurovascular bundle, and  these structures were preserved.  I felt that there were some enlarged,  probably involved lymph nodes in the axilla in addition to the large mass.  We swept all of the axillary contents out, removed them en bloc with the  breast specimen and sent it to the pathology lab for routine histology.  The  wound  was copiously irrigated with saline.  Hemostasis was excellent and it  had been achieved with electrocautery and metal clips.  Two 19-French Blake  drains were placed, one up into the axilla and anteriorly across the skin  flaps.  These were brought out through stab wounds inferolaterally, sutured  to the skin with nylon sutures and connected to suction bulbs.  The skin  incision was closed with skin staples.  The wound was cleansed, dried and  covered with a sterile towel.   We then changed our gowns and gloves and instruments and reprepped and  redraped the patient for the Port-A-Cath with the right arm at the side.   A right subclavian venipuncture was attempted.  I made 4 or 5 attempts and  got a little bit of blood return and one point but could not thread the  wire.  After 5 attempts I felt that I should abandon this approach.  I then  performed a right internal jugular venipuncture without difficulty, inserted  the guidewire into the superior vena cava under fluoroscopic guidance.  A  small transverse incision was made in the right neck at the wire insertion  site.  I made a transverse incision in the right anterior chest about 2.5 cm  below the right clavicle.  Dissection was carried down through subcutaneous  tissue to the pectoralis fascia.  I debrided a little bit of the  subcutaneous fat.  Using a tunneling device, I drew the port catheter  retrograde from the neck incision to the infraclavicular incision.  Using  the fluoroscopy I measured the length of the catheter and then cut the  catheter at an appropriate length, secured it to the port with a locking  device and then flushed it with heparinized saline.  The port was sutured to  the right pectoralis fascia with two interrupted sutures of 2-0 Prolene.  The port flushed easily.   With the patient back in Trendelenburg position, the dilator and peel-away sheath assembly was inserted over the guidewire into the central  venous  circulation.  The guidewire and dilator were removed and the catheter  inserted through the peel-away sheath into the superior vena cava and the  peel-away sheath removed.  The catheter flushed easily and had excellent  blood return and was flushed with heparinized saline.  Fluoroscopy was then  performed and confirmed that the tip of the catheter was in the superior  vena cava just above the right atrium.  There was no kinking or crimping of  the catheter anywhere along its course.  We flushed the port and the  catheter with concentrated heparin solution at this point.  The wounds were  irrigated with saline.  Hemostasis was excellent.  The subcutaneous tissue  was closed with interrupted sutures of 3-0 Vicryl.  Both the neck and the  right infraclavicular incision were closed with running sutures of 4-0  Monocryl and Steri-Strips.   Clean bandages were placed on the mastectomy incision, the Port-A-Cath  incision, and then a large bulky compressive bandage was placed using 6-inch  Ace wraps.  The patient tolerated the procedure well and was taken to the  recovery room in stable condition.  Estimated blood loss was about 200-250  mL.  Complications:  None.  Sponge, needle and instrument counts were  correct.      Angelia Mould. Derrell Lolling, M.D.  Electronically Signed     HMI/MEDQ  D:  01/15/2006  T:  01/15/2006  Job:  [id number]   cc:   Valentino Hue. Magrinat, M.D.  Norva Pavlov, M.D.

## 2010-11-08 ENCOUNTER — Encounter (HOSPITAL_BASED_OUTPATIENT_CLINIC_OR_DEPARTMENT_OTHER): Payer: PRIVATE HEALTH INSURANCE | Admitting: Oncology

## 2010-11-08 DIAGNOSIS — C50419 Malignant neoplasm of upper-outer quadrant of unspecified female breast: Secondary | ICD-10-CM

## 2010-11-08 DIAGNOSIS — Z170 Estrogen receptor positive status [ER+]: Secondary | ICD-10-CM

## 2010-11-13 ENCOUNTER — Other Ambulatory Visit: Payer: Self-pay | Admitting: Oncology

## 2010-11-13 DIAGNOSIS — E278 Other specified disorders of adrenal gland: Secondary | ICD-10-CM

## 2010-11-19 ENCOUNTER — Other Ambulatory Visit: Payer: Self-pay | Admitting: Oncology

## 2010-11-19 ENCOUNTER — Other Ambulatory Visit: Payer: Self-pay | Admitting: Diagnostic Radiology

## 2010-11-19 ENCOUNTER — Ambulatory Visit (HOSPITAL_COMMUNITY)
Admission: RE | Admit: 2010-11-19 | Discharge: 2010-11-19 | Disposition: A | Discharge status: Home / Self Care | Payer: PRIVATE HEALTH INSURANCE | Source: Ambulatory Visit | Attending: Oncology | Admitting: Oncology

## 2010-11-19 ENCOUNTER — Ambulatory Visit (HOSPITAL_COMMUNITY): Payer: PRIVATE HEALTH INSURANCE

## 2010-11-19 ENCOUNTER — Encounter (HOSPITAL_COMMUNITY): Payer: Self-pay

## 2010-11-19 DIAGNOSIS — C7970 Secondary malignant neoplasm of unspecified adrenal gland: Secondary | ICD-10-CM | POA: Insufficient documentation

## 2010-11-19 DIAGNOSIS — Z853 Personal history of malignant neoplasm of breast: Secondary | ICD-10-CM | POA: Insufficient documentation

## 2010-11-19 DIAGNOSIS — E278 Other specified disorders of adrenal gland: Secondary | ICD-10-CM

## 2010-11-19 DIAGNOSIS — Z79899 Other long term (current) drug therapy: Secondary | ICD-10-CM | POA: Insufficient documentation

## 2010-11-19 LAB — CBC
Hemoglobin: 12.6 g/dL (ref 12.0–15.0)
MCH: 28.3 pg (ref 26.0–34.0)
MCV: 87.4 fL (ref 78.0–100.0)
WBC: 5.3 10*3/uL (ref 4.0–10.5)

## 2010-11-29 ENCOUNTER — Encounter (HOSPITAL_BASED_OUTPATIENT_CLINIC_OR_DEPARTMENT_OTHER): Payer: PRIVATE HEALTH INSURANCE | Admitting: Oncology

## 2010-11-29 ENCOUNTER — Other Ambulatory Visit: Payer: Self-pay | Admitting: Oncology

## 2010-11-29 DIAGNOSIS — C50919 Malignant neoplasm of unspecified site of unspecified female breast: Secondary | ICD-10-CM

## 2010-11-29 DIAGNOSIS — Z170 Estrogen receptor positive status [ER+]: Secondary | ICD-10-CM

## 2010-11-29 DIAGNOSIS — C7970 Secondary malignant neoplasm of unspecified adrenal gland: Secondary | ICD-10-CM

## 2010-11-30 ENCOUNTER — Other Ambulatory Visit: Payer: Self-pay | Admitting: Oncology

## 2010-11-30 DIAGNOSIS — C50919 Malignant neoplasm of unspecified site of unspecified female breast: Secondary | ICD-10-CM

## 2010-12-04 ENCOUNTER — Ambulatory Visit (HOSPITAL_COMMUNITY)
Admission: RE | Admit: 2010-12-04 | Discharge: 2010-12-04 | Disposition: A | Discharge status: Home / Self Care | Payer: PRIVATE HEALTH INSURANCE | Source: Ambulatory Visit | Attending: Oncology | Admitting: Oncology

## 2010-12-04 DIAGNOSIS — C50919 Malignant neoplasm of unspecified site of unspecified female breast: Secondary | ICD-10-CM

## 2010-12-04 DIAGNOSIS — Z01818 Encounter for other preprocedural examination: Secondary | ICD-10-CM | POA: Insufficient documentation

## 2010-12-04 MED ORDER — GADOBENATE DIMEGLUMINE 529 MG/ML IV SOLN
20.0000 mL | Freq: Once | INTRAVENOUS | Status: AC | PRN
  Administered 2010-12-04: 20 mL via INTRAVENOUS

## 2010-12-05 ENCOUNTER — Ambulatory Visit (HOSPITAL_COMMUNITY)
Admission: RE | Admit: 2010-12-05 | Discharge: 2010-12-05 | Disposition: A | Discharge status: Home / Self Care | Payer: PRIVATE HEALTH INSURANCE | Source: Ambulatory Visit | Attending: Oncology | Admitting: Oncology

## 2010-12-05 ENCOUNTER — Other Ambulatory Visit: Payer: Self-pay | Admitting: Oncology

## 2010-12-05 DIAGNOSIS — C50919 Malignant neoplasm of unspecified site of unspecified female breast: Secondary | ICD-10-CM | POA: Insufficient documentation

## 2010-12-05 DIAGNOSIS — Z79899 Other long term (current) drug therapy: Secondary | ICD-10-CM | POA: Insufficient documentation

## 2010-12-05 HISTORY — PX: OTHER SURGICAL HISTORY: SHX169

## 2010-12-05 LAB — CBC
HCT: 39.5 % (ref 36.0–46.0)
MCV: 87.4 fL (ref 78.0–100.0)
RBC: 4.52 MIL/uL (ref 3.87–5.11)
WBC: 5.9 10*3/uL (ref 4.0–10.5)

## 2010-12-12 ENCOUNTER — Other Ambulatory Visit: Payer: Self-pay | Admitting: Oncology

## 2010-12-12 ENCOUNTER — Encounter (HOSPITAL_BASED_OUTPATIENT_CLINIC_OR_DEPARTMENT_OTHER): Payer: PRIVATE HEALTH INSURANCE | Admitting: Oncology

## 2010-12-12 DIAGNOSIS — Z170 Estrogen receptor positive status [ER+]: Secondary | ICD-10-CM

## 2010-12-12 DIAGNOSIS — C50419 Malignant neoplasm of upper-outer quadrant of unspecified female breast: Secondary | ICD-10-CM

## 2010-12-12 DIAGNOSIS — Z5111 Encounter for antineoplastic chemotherapy: Secondary | ICD-10-CM

## 2010-12-12 DIAGNOSIS — C50919 Malignant neoplasm of unspecified site of unspecified female breast: Secondary | ICD-10-CM

## 2010-12-12 LAB — CBC WITH DIFFERENTIAL/PLATELET
Basophils Absolute: 0 10*3/uL (ref 0.0–0.1)
EOS%: 3.1 % (ref 0.0–7.0)
HCT: 37.5 % (ref 34.8–46.6)
HGB: 12.6 g/dL (ref 11.6–15.9)
MCH: 29.3 pg (ref 25.1–34.0)
MCV: 87.2 fL (ref 79.5–101.0)
MONO%: 7.6 % (ref 0.0–14.0)
NEUT%: 54.6 % (ref 38.4–76.8)
Platelets: 212 10*3/uL (ref 145–400)

## 2011-01-02 ENCOUNTER — Encounter (HOSPITAL_BASED_OUTPATIENT_CLINIC_OR_DEPARTMENT_OTHER): Payer: PRIVATE HEALTH INSURANCE | Admitting: Oncology

## 2011-01-02 ENCOUNTER — Other Ambulatory Visit: Payer: Self-pay | Admitting: Oncology

## 2011-01-02 DIAGNOSIS — C50919 Malignant neoplasm of unspecified site of unspecified female breast: Secondary | ICD-10-CM

## 2011-01-02 DIAGNOSIS — C50419 Malignant neoplasm of upper-outer quadrant of unspecified female breast: Secondary | ICD-10-CM

## 2011-01-02 DIAGNOSIS — Z5111 Encounter for antineoplastic chemotherapy: Secondary | ICD-10-CM

## 2011-01-02 DIAGNOSIS — Z170 Estrogen receptor positive status [ER+]: Secondary | ICD-10-CM

## 2011-01-02 LAB — COMPREHENSIVE METABOLIC PANEL
ALT: 32 U/L (ref 0–35)
BUN: 14 mg/dL (ref 6–23)
CO2: 21 mEq/L (ref 19–32)
Creatinine, Ser: 1.08 mg/dL (ref 0.50–1.10)
Total Bilirubin: 0.8 mg/dL (ref 0.3–1.2)

## 2011-01-02 LAB — CBC WITH DIFFERENTIAL/PLATELET
Eosinophils Absolute: 0.2 10*3/uL (ref 0.0–0.5)
LYMPH%: 33 % (ref 14.0–49.7)
MCHC: 33.8 g/dL (ref 31.5–36.0)
MCV: 87.4 fL (ref 79.5–101.0)
MONO%: 5.1 % (ref 0.0–14.0)
NEUT#: 2.9 10*3/uL (ref 1.5–6.5)
Platelets: 213 10*3/uL (ref 145–400)
RBC: 4.3 10*6/uL (ref 3.70–5.45)
nRBC: 0 % (ref 0–0)

## 2011-01-02 LAB — CANCER ANTIGEN 27.29: CA 27.29: 33 U/mL (ref 0–39)

## 2011-01-10 ENCOUNTER — Ambulatory Visit: Payer: PRIVATE HEALTH INSURANCE

## 2011-01-10 ENCOUNTER — Other Ambulatory Visit: Payer: PRIVATE HEALTH INSURANCE

## 2011-01-16 ENCOUNTER — Ambulatory Visit
Admission: RE | Admit: 2011-01-16 | Discharge: 2011-01-16 | Disposition: A | Discharge status: Home / Self Care | Payer: PRIVATE HEALTH INSURANCE | Source: Ambulatory Visit | Attending: Oncology | Admitting: Oncology

## 2011-01-16 DIAGNOSIS — Z9010 Acquired absence of unspecified breast and nipple: Secondary | ICD-10-CM

## 2011-01-16 DIAGNOSIS — Z853 Personal history of malignant neoplasm of breast: Secondary | ICD-10-CM

## 2011-01-23 ENCOUNTER — Other Ambulatory Visit: Payer: Self-pay | Admitting: Oncology

## 2011-01-23 ENCOUNTER — Encounter (HOSPITAL_BASED_OUTPATIENT_CLINIC_OR_DEPARTMENT_OTHER): Payer: PRIVATE HEALTH INSURANCE | Admitting: Oncology

## 2011-01-23 DIAGNOSIS — Z5112 Encounter for antineoplastic immunotherapy: Secondary | ICD-10-CM

## 2011-01-23 DIAGNOSIS — Z170 Estrogen receptor positive status [ER+]: Secondary | ICD-10-CM

## 2011-01-23 DIAGNOSIS — R030 Elevated blood-pressure reading, without diagnosis of hypertension: Secondary | ICD-10-CM

## 2011-01-23 DIAGNOSIS — C50419 Malignant neoplasm of upper-outer quadrant of unspecified female breast: Secondary | ICD-10-CM

## 2011-01-23 DIAGNOSIS — C50919 Malignant neoplasm of unspecified site of unspecified female breast: Secondary | ICD-10-CM

## 2011-01-23 DIAGNOSIS — R7309 Other abnormal glucose: Secondary | ICD-10-CM

## 2011-01-23 LAB — CBC WITH DIFFERENTIAL/PLATELET
Basophils Absolute: 0 10*3/uL (ref 0.0–0.1)
Eosinophils Absolute: 0.2 10*3/uL (ref 0.0–0.5)
HCT: 37.7 % (ref 34.8–46.6)
HGB: 12.5 g/dL (ref 11.6–15.9)
MCH: 29.8 pg (ref 25.1–34.0)
MONO#: 0.2 10*3/uL (ref 0.1–0.9)
NEUT#: 3.9 10*3/uL (ref 1.5–6.5)
NEUT%: 75.7 % (ref 38.4–76.8)
WBC: 5.2 10*3/uL (ref 3.9–10.3)
lymph#: 0.8 10*3/uL — ABNORMAL LOW (ref 0.9–3.3)

## 2011-01-23 LAB — COMPREHENSIVE METABOLIC PANEL
ALT: 41 U/L — ABNORMAL HIGH (ref 0–35)
AST: 35 U/L (ref 0–37)
Calcium: 9.1 mg/dL (ref 8.4–10.5)
Chloride: 104 mEq/L (ref 96–112)
Creatinine, Ser: 1.13 mg/dL — ABNORMAL HIGH (ref 0.50–1.10)
Potassium: 4 mEq/L (ref 3.5–5.3)

## 2011-02-13 ENCOUNTER — Other Ambulatory Visit: Payer: Self-pay | Admitting: Oncology

## 2011-02-13 ENCOUNTER — Encounter (HOSPITAL_BASED_OUTPATIENT_CLINIC_OR_DEPARTMENT_OTHER): Payer: PRIVATE HEALTH INSURANCE | Admitting: Oncology

## 2011-02-13 DIAGNOSIS — Z170 Estrogen receptor positive status [ER+]: Secondary | ICD-10-CM

## 2011-02-13 DIAGNOSIS — C50919 Malignant neoplasm of unspecified site of unspecified female breast: Secondary | ICD-10-CM

## 2011-02-13 DIAGNOSIS — Z5112 Encounter for antineoplastic immunotherapy: Secondary | ICD-10-CM

## 2011-02-13 DIAGNOSIS — C50419 Malignant neoplasm of upper-outer quadrant of unspecified female breast: Secondary | ICD-10-CM

## 2011-02-13 LAB — CBC WITH DIFFERENTIAL/PLATELET
BASO%: 0.7 % (ref 0.0–2.0)
EOS%: 4.5 % (ref 0.0–7.0)
HCT: 36.9 % (ref 34.8–46.6)
LYMPH%: 27.9 % (ref 14.0–49.7)
MCH: 29.5 pg (ref 25.1–34.0)
MCHC: 33.6 g/dL (ref 31.5–36.0)
MONO#: 0.4 10*3/uL (ref 0.1–0.9)
NEUT%: 59.7 % (ref 38.4–76.8)
RBC: 4.2 10*6/uL (ref 3.70–5.45)
WBC: 5.6 10*3/uL (ref 3.9–10.3)
lymph#: 1.6 10*3/uL (ref 0.9–3.3)
nRBC: 0 % (ref 0–0)

## 2011-02-13 LAB — COMPREHENSIVE METABOLIC PANEL
ALT: 32 U/L (ref 0–35)
AST: 22 U/L (ref 0–37)
Alkaline Phosphatase: 98 U/L (ref 39–117)
BUN: 14 mg/dL (ref 6–23)
Chloride: 102 mEq/L (ref 96–112)
Creatinine, Ser: 0.91 mg/dL (ref 0.50–1.10)
Potassium: 4.1 mEq/L (ref 3.5–5.3)

## 2011-02-13 LAB — CANCER ANTIGEN 27.29: CA 27.29: 35 U/mL (ref 0–39)

## 2011-02-13 LAB — HEMOGLOBIN A1C: Hgb A1c MFr Bld: 6.3 % — ABNORMAL HIGH (ref <5.7)

## 2011-02-14 ENCOUNTER — Ambulatory Visit (HOSPITAL_COMMUNITY): Payer: PRIVATE HEALTH INSURANCE | Attending: Oncology

## 2011-02-20 ENCOUNTER — Other Ambulatory Visit: Payer: Self-pay | Admitting: Oncology

## 2011-02-20 ENCOUNTER — Encounter (HOSPITAL_BASED_OUTPATIENT_CLINIC_OR_DEPARTMENT_OTHER): Payer: PRIVATE HEALTH INSURANCE | Admitting: Oncology

## 2011-02-20 DIAGNOSIS — C50419 Malignant neoplasm of upper-outer quadrant of unspecified female breast: Secondary | ICD-10-CM

## 2011-02-20 DIAGNOSIS — C50919 Malignant neoplasm of unspecified site of unspecified female breast: Secondary | ICD-10-CM

## 2011-02-20 DIAGNOSIS — C773 Secondary and unspecified malignant neoplasm of axilla and upper limb lymph nodes: Secondary | ICD-10-CM

## 2011-02-20 DIAGNOSIS — R030 Elevated blood-pressure reading, without diagnosis of hypertension: Secondary | ICD-10-CM

## 2011-03-06 ENCOUNTER — Other Ambulatory Visit: Payer: Self-pay | Admitting: Oncology

## 2011-03-06 ENCOUNTER — Encounter (HOSPITAL_BASED_OUTPATIENT_CLINIC_OR_DEPARTMENT_OTHER): Payer: PRIVATE HEALTH INSURANCE | Admitting: Oncology

## 2011-03-06 DIAGNOSIS — Z170 Estrogen receptor positive status [ER+]: Secondary | ICD-10-CM

## 2011-03-06 DIAGNOSIS — Z5111 Encounter for antineoplastic chemotherapy: Secondary | ICD-10-CM

## 2011-03-06 DIAGNOSIS — C50419 Malignant neoplasm of upper-outer quadrant of unspecified female breast: Secondary | ICD-10-CM

## 2011-03-06 DIAGNOSIS — C50919 Malignant neoplasm of unspecified site of unspecified female breast: Secondary | ICD-10-CM

## 2011-03-06 LAB — COMPREHENSIVE METABOLIC PANEL
ALT: 31 U/L (ref 0–35)
AST: 26 U/L (ref 0–37)
Albumin: 3.9 g/dL (ref 3.5–5.2)
Alkaline Phosphatase: 85 U/L (ref 39–117)
BUN: 13 mg/dL (ref 6–23)
CO2: 27 mEq/L (ref 19–32)
Calcium: 9.2 mg/dL (ref 8.4–10.5)
Chloride: 103 mEq/L (ref 96–112)
Creatinine, Ser: 1 mg/dL (ref 0.50–1.10)
Glucose, Bld: 107 mg/dL — ABNORMAL HIGH (ref 70–99)
Potassium: 4.2 mEq/L (ref 3.5–5.3)
Sodium: 140 mEq/L (ref 135–145)
Total Bilirubin: 0.6 mg/dL (ref 0.3–1.2)
Total Protein: 6.5 g/dL (ref 6.0–8.3)

## 2011-03-06 LAB — CBC WITH DIFFERENTIAL/PLATELET
EOS%: 3.3 % (ref 0.0–7.0)
LYMPH%: 31.4 % (ref 14.0–49.7)
MCH: 29.6 pg (ref 25.1–34.0)
MCV: 88.3 fL (ref 79.5–101.0)
MONO%: 9.1 % (ref 0.0–14.0)
Platelets: 216 10*3/uL (ref 145–400)
RBC: 4.02 10*6/uL (ref 3.70–5.45)
RDW: 13.4 % (ref 11.2–14.5)
nRBC: 0 % (ref 0–0)

## 2011-03-06 LAB — CANCER ANTIGEN 27.29: CA 27.29: 44 U/mL — ABNORMAL HIGH (ref 0–39)

## 2011-03-27 ENCOUNTER — Other Ambulatory Visit: Payer: Self-pay | Admitting: Oncology

## 2011-03-27 ENCOUNTER — Encounter (HOSPITAL_BASED_OUTPATIENT_CLINIC_OR_DEPARTMENT_OTHER): Payer: PRIVATE HEALTH INSURANCE | Admitting: Oncology

## 2011-03-27 DIAGNOSIS — C50919 Malignant neoplasm of unspecified site of unspecified female breast: Secondary | ICD-10-CM

## 2011-03-27 DIAGNOSIS — Z5112 Encounter for antineoplastic immunotherapy: Secondary | ICD-10-CM

## 2011-03-27 DIAGNOSIS — Z5111 Encounter for antineoplastic chemotherapy: Secondary | ICD-10-CM

## 2011-03-27 DIAGNOSIS — C50419 Malignant neoplasm of upper-outer quadrant of unspecified female breast: Secondary | ICD-10-CM

## 2011-03-27 LAB — CBC WITH DIFFERENTIAL/PLATELET
Basophils Absolute: 0.1 10*3/uL (ref 0.0–0.1)
Eosinophils Absolute: 0.2 10*3/uL (ref 0.0–0.5)
HCT: 35 % (ref 34.8–46.6)
LYMPH%: 29 % (ref 14.0–49.7)
MCV: 88.2 fL (ref 79.5–101.0)
MONO%: 6.9 % (ref 0.0–14.0)
NEUT#: 2.3 10*3/uL (ref 1.5–6.5)
NEUT%: 58.5 % (ref 38.4–76.8)
Platelets: 193 10*3/uL (ref 145–400)
RBC: 3.97 10*6/uL (ref 3.70–5.45)

## 2011-03-27 LAB — COMPREHENSIVE METABOLIC PANEL
AST: 27 U/L (ref 0–37)
Albumin: 3.9 g/dL (ref 3.5–5.2)
BUN: 15 mg/dL (ref 6–23)
CO2: 23 mEq/L (ref 19–32)
Calcium: 9 mg/dL (ref 8.4–10.5)
Chloride: 103 mEq/L (ref 96–112)
Glucose, Bld: 189 mg/dL — ABNORMAL HIGH (ref 70–99)
Potassium: 3.9 mEq/L (ref 3.5–5.3)

## 2011-03-27 LAB — HEMOGLOBIN A1C: Hgb A1c MFr Bld: 6.5 % — ABNORMAL HIGH (ref <5.7)

## 2011-03-27 LAB — CANCER ANTIGEN 27.29: CA 27.29: 50 U/mL — ABNORMAL HIGH (ref 0–39)

## 2011-03-27 LAB — POCT HEMOGLOBIN-HEMACUE
Hemoglobin: 12.6
Operator id: 239491

## 2011-04-10 ENCOUNTER — Ambulatory Visit (HOSPITAL_COMMUNITY)
Admission: RE | Admit: 2011-04-10 | Discharge: 2011-04-10 | Disposition: A | Discharge status: Home / Self Care | Payer: PRIVATE HEALTH INSURANCE | Source: Ambulatory Visit | Attending: Oncology | Admitting: Oncology

## 2011-04-10 ENCOUNTER — Other Ambulatory Visit: Payer: Self-pay | Admitting: Oncology

## 2011-04-10 DIAGNOSIS — C801 Malignant (primary) neoplasm, unspecified: Secondary | ICD-10-CM

## 2011-04-10 DIAGNOSIS — Z9221 Personal history of antineoplastic chemotherapy: Secondary | ICD-10-CM | POA: Insufficient documentation

## 2011-04-10 DIAGNOSIS — K7689 Other specified diseases of liver: Secondary | ICD-10-CM | POA: Insufficient documentation

## 2011-04-10 DIAGNOSIS — C7970 Secondary malignant neoplasm of unspecified adrenal gland: Secondary | ICD-10-CM | POA: Insufficient documentation

## 2011-04-10 DIAGNOSIS — C772 Secondary and unspecified malignant neoplasm of intra-abdominal lymph nodes: Secondary | ICD-10-CM | POA: Insufficient documentation

## 2011-04-10 DIAGNOSIS — Z09 Encounter for follow-up examination after completed treatment for conditions other than malignant neoplasm: Secondary | ICD-10-CM

## 2011-04-10 DIAGNOSIS — Z923 Personal history of irradiation: Secondary | ICD-10-CM | POA: Insufficient documentation

## 2011-04-10 DIAGNOSIS — Z9089 Acquired absence of other organs: Secondary | ICD-10-CM | POA: Insufficient documentation

## 2011-04-10 DIAGNOSIS — Z79899 Other long term (current) drug therapy: Secondary | ICD-10-CM | POA: Insufficient documentation

## 2011-04-10 DIAGNOSIS — C50919 Malignant neoplasm of unspecified site of unspecified female breast: Secondary | ICD-10-CM | POA: Insufficient documentation

## 2011-04-10 DIAGNOSIS — N9489 Other specified conditions associated with female genital organs and menstrual cycle: Secondary | ICD-10-CM | POA: Insufficient documentation

## 2011-04-10 MED ORDER — IOHEXOL 300 MG/ML  SOLN
100.0000 mL | Freq: Once | INTRAMUSCULAR | Status: AC | PRN
  Administered 2011-04-10: 100 mL via INTRAVENOUS

## 2011-04-17 ENCOUNTER — Encounter (HOSPITAL_BASED_OUTPATIENT_CLINIC_OR_DEPARTMENT_OTHER): Payer: PRIVATE HEALTH INSURANCE | Admitting: Oncology

## 2011-04-17 ENCOUNTER — Other Ambulatory Visit: Payer: Self-pay | Admitting: Oncology

## 2011-04-17 DIAGNOSIS — Z5111 Encounter for antineoplastic chemotherapy: Secondary | ICD-10-CM

## 2011-04-17 DIAGNOSIS — C773 Secondary and unspecified malignant neoplasm of axilla and upper limb lymph nodes: Secondary | ICD-10-CM

## 2011-04-17 DIAGNOSIS — C7970 Secondary malignant neoplasm of unspecified adrenal gland: Secondary | ICD-10-CM

## 2011-04-17 DIAGNOSIS — C50419 Malignant neoplasm of upper-outer quadrant of unspecified female breast: Secondary | ICD-10-CM

## 2011-04-17 DIAGNOSIS — C50919 Malignant neoplasm of unspecified site of unspecified female breast: Secondary | ICD-10-CM

## 2011-04-17 DIAGNOSIS — Z5112 Encounter for antineoplastic immunotherapy: Secondary | ICD-10-CM

## 2011-04-17 LAB — CBC WITH DIFFERENTIAL/PLATELET
Basophils Absolute: 0.1 10*3/uL (ref 0.0–0.1)
HCT: 38.8 % (ref 34.8–46.6)
HGB: 13 g/dL (ref 11.6–15.9)
MONO#: 0.5 10*3/uL (ref 0.1–0.9)
NEUT%: 63.9 % (ref 38.4–76.8)
Platelets: 234 10*3/uL (ref 145–400)
WBC: 6.1 10*3/uL (ref 3.9–10.3)
lymph#: 1.5 10*3/uL (ref 0.9–3.3)

## 2011-04-18 ENCOUNTER — Other Ambulatory Visit: Payer: Self-pay | Admitting: Oncology

## 2011-04-18 DIAGNOSIS — C801 Malignant (primary) neoplasm, unspecified: Secondary | ICD-10-CM | POA: Insufficient documentation

## 2011-04-24 ENCOUNTER — Other Ambulatory Visit: Payer: Self-pay | Admitting: Oncology

## 2011-04-24 ENCOUNTER — Ambulatory Visit (HOSPITAL_BASED_OUTPATIENT_CLINIC_OR_DEPARTMENT_OTHER): Payer: PRIVATE HEALTH INSURANCE

## 2011-04-24 ENCOUNTER — Other Ambulatory Visit (HOSPITAL_BASED_OUTPATIENT_CLINIC_OR_DEPARTMENT_OTHER): Payer: PRIVATE HEALTH INSURANCE | Admitting: Lab

## 2011-04-24 VITALS — BP 158/72 | HR 74 | Temp 97.3°F

## 2011-04-24 DIAGNOSIS — C50919 Malignant neoplasm of unspecified site of unspecified female breast: Secondary | ICD-10-CM

## 2011-04-24 DIAGNOSIS — Z5111 Encounter for antineoplastic chemotherapy: Secondary | ICD-10-CM

## 2011-04-24 DIAGNOSIS — C7970 Secondary malignant neoplasm of unspecified adrenal gland: Secondary | ICD-10-CM

## 2011-04-24 DIAGNOSIS — C773 Secondary and unspecified malignant neoplasm of axilla and upper limb lymph nodes: Secondary | ICD-10-CM

## 2011-04-24 DIAGNOSIS — C801 Malignant (primary) neoplasm, unspecified: Secondary | ICD-10-CM

## 2011-04-24 LAB — COMPREHENSIVE METABOLIC PANEL
AST: 29 U/L (ref 0–37)
Albumin: 3.9 g/dL (ref 3.5–5.2)
Alkaline Phosphatase: 76 U/L (ref 39–117)
Glucose, Bld: 116 mg/dL — ABNORMAL HIGH (ref 70–99)
Potassium: 4.3 mEq/L (ref 3.5–5.3)
Sodium: 139 mEq/L (ref 135–145)
Total Bilirubin: 0.5 mg/dL (ref 0.3–1.2)
Total Protein: 6.6 g/dL (ref 6.0–8.3)

## 2011-04-24 LAB — CBC WITH DIFFERENTIAL/PLATELET
Basophils Absolute: 0.1 10*3/uL (ref 0.0–0.1)
Eosinophils Absolute: 0.2 10*3/uL (ref 0.0–0.5)
HCT: 35.8 % (ref 34.8–46.6)
HGB: 11.7 g/dL (ref 11.6–15.9)
LYMPH%: 23.6 % (ref 14.0–49.7)
MCV: 89.3 fL (ref 79.5–101.0)
MONO%: 5.8 % (ref 0.0–14.0)
NEUT#: 2.7 10*3/uL (ref 1.5–6.5)
NEUT%: 64.6 % (ref 38.4–76.8)
Platelets: 199 10*3/uL (ref 145–400)

## 2011-04-24 MED ORDER — SODIUM CHLORIDE 0.9 % IV SOLN
Freq: Once | INTRAVENOUS | Status: AC
  Administered 2011-04-24: 11:00:00 via INTRAVENOUS

## 2011-04-24 MED ORDER — LACTATED RINGERS IV SOLN
40.0000 mg/m2 | Freq: Once | INTRAVENOUS | Status: AC
  Administered 2011-04-24: 78 mg via INTRAVENOUS
  Filled 2011-04-24: qty 39

## 2011-04-24 MED ORDER — ONDANSETRON 16 MG/50ML IVPB (CHCC)
16.0000 mg | Freq: Once | INTRAVENOUS | Status: AC
  Administered 2011-04-24 (×2): 16 mg via INTRAVENOUS

## 2011-04-24 MED ORDER — HEPARIN SOD (PORK) LOCK FLUSH 100 UNIT/ML IV SOLN
500.0000 [IU] | Freq: Once | INTRAVENOUS | Status: AC | PRN
  Administered 2011-04-24: 500 [IU]
  Filled 2011-04-24: qty 5

## 2011-04-24 MED ORDER — FAMOTIDINE IN NACL 20-0.9 MG/50ML-% IV SOLN
20.0000 mg | Freq: Once | INTRAVENOUS | Status: AC
  Administered 2011-04-24: 20 mg via INTRAVENOUS

## 2011-04-24 MED ORDER — DEXAMETHASONE SODIUM PHOSPHATE 4 MG/ML IJ SOLN
20.0000 mg | Freq: Once | INTRAMUSCULAR | Status: AC
  Administered 2011-04-24 (×2): 20 mg via INTRAVENOUS

## 2011-04-24 MED ORDER — SODIUM CHLORIDE 0.9 % IJ SOLN
10.0000 mL | INTRAMUSCULAR | Status: DC | PRN
  Administered 2011-04-24: 10 mL
  Filled 2011-04-24: qty 10

## 2011-04-24 MED ORDER — DIPHENHYDRAMINE HCL 50 MG/ML IJ SOLN
50.0000 mg | Freq: Once | INTRAMUSCULAR | Status: AC
  Administered 2011-04-24 (×2): 50 mg via INTRAVENOUS

## 2011-04-25 ENCOUNTER — Telehealth: Payer: Self-pay | Admitting: *Deleted

## 2011-04-25 NOTE — Telephone Encounter (Signed)
Message copied by Augusto Garbe on Thu Apr 25, 2011  1:42 PM ------      Message from: Barbara Cower      Created: Wed Apr 24, 2011  4:46 PM      Regarding: FW: chemo follow-up call      Contact: [phone]                   ----- Message -----         From: Tylene Fantasia         Sent: 04/24/2011   4:13 PM           To: Arvilla Meres, RN, Trinidad Curet      Subject: chemo follow-up call                                     First time Ixempra.  [name]            Thanks            Morrie Sheldon

## 2011-04-25 NOTE — Telephone Encounter (Signed)
Called patient to f/u after first Ixempra chemotherapy.  Message left requesting a return call to [facility] Triage.

## 2011-04-29 ENCOUNTER — Telehealth: Payer: Self-pay | Admitting: *Deleted

## 2011-04-29 NOTE — Telephone Encounter (Signed)
Rec. Call from pt reporting that she is experiencing "bone pain" after her first tx with  ixabepilone. Pt states that she has been using Tylenol but it is not helping much. Will review this note with MD

## 2011-04-29 NOTE — Telephone Encounter (Signed)
Per MD, notified pt to "try Advil or Aleve" for pain. The pt knows to call this desk with if this does not work

## 2011-05-03 ENCOUNTER — Telehealth: Payer: Self-pay | Admitting: Oncology

## 2011-05-03 NOTE — Telephone Encounter (Addendum)
04/30/11 Faxed records to Rene Paci, RN @ [employer] [phone] with the change in treatment to Ixempra A2130.  Her phone number is [phone] ext. [phone]. 05/02/11 Fax from [employer] requesting additional information.  I left a message for someone to call me and let me know what they needed.  Ref #[id number]. 05/03/11 Call from Rise Paganini @ [employer] stating Ixempra [phone] every 2 weeks has been approved from 04/24/11 with no end date.

## 2011-05-06 ENCOUNTER — Other Ambulatory Visit: Payer: Self-pay | Admitting: Oncology

## 2011-05-06 ENCOUNTER — Other Ambulatory Visit (HOSPITAL_BASED_OUTPATIENT_CLINIC_OR_DEPARTMENT_OTHER): Payer: PRIVATE HEALTH INSURANCE | Admitting: Lab

## 2011-05-06 DIAGNOSIS — Z5111 Encounter for antineoplastic chemotherapy: Secondary | ICD-10-CM

## 2011-05-06 DIAGNOSIS — C50919 Malignant neoplasm of unspecified site of unspecified female breast: Secondary | ICD-10-CM

## 2011-05-06 DIAGNOSIS — C50419 Malignant neoplasm of upper-outer quadrant of unspecified female breast: Secondary | ICD-10-CM

## 2011-05-06 DIAGNOSIS — R030 Elevated blood-pressure reading, without diagnosis of hypertension: Secondary | ICD-10-CM

## 2011-05-06 LAB — CBC WITH DIFFERENTIAL/PLATELET
Eosinophils Absolute: 0.2 10*3/uL (ref 0.0–0.5)
HCT: 33.7 % — ABNORMAL LOW (ref 34.8–46.6)
LYMPH%: 37.7 % (ref 14.0–49.7)
MCHC: 32.9 g/dL (ref 31.5–36.0)
MCV: 88.7 fL (ref 79.5–101.0)
MONO#: 0.3 10*3/uL (ref 0.1–0.9)
MONO%: 10.8 % (ref 0.0–14.0)
NEUT#: 1.4 10*3/uL — ABNORMAL LOW (ref 1.5–6.5)
NEUT%: 45.2 % (ref 38.4–76.8)
Platelets: 235 10*3/uL (ref 145–400)
WBC: 3.2 10*3/uL — ABNORMAL LOW (ref 3.9–10.3)

## 2011-05-08 ENCOUNTER — Ambulatory Visit: Payer: PRIVATE HEALTH INSURANCE

## 2011-05-08 ENCOUNTER — Other Ambulatory Visit: Payer: PRIVATE HEALTH INSURANCE | Admitting: Lab

## 2011-05-08 NOTE — Telephone Encounter (Signed)
° °  ° °  ° ° ','<  More Detail >>       Emilee Hero         Sent:  Wed May 08, 2011 1:34 PM   To:  Emilee Hero            User: Emilee Hero             Patient: Rebecca Pugh  Type: Telephone Encounter            Encounter: 05/03/2011  04/30/11 Faxed records to Rene Paci, RN @ [employer] [phone] with the change in treatment to Ixempra U9811. Her phone number is [phone] ext. [phone]. 05/02/11 Fax from [employer] requesting additional information. I left a message for someone to call me and let me know what they needed. Ref #[id number]. 05/03/11 Call from Rise Paganini @ [employer] stating it has been approved.                Select Font Size      Small Medium Large Extra Extra Large             Rebecca Pugh  Description:  60 year old female  05/03/2011 11:26 AM Telephone Provider:  Lowella Dell, MD  MRN: [id number] Department:  [facility]                Call Documentation     Emilee Hero 05/03/2011 12:16 PM Signed  04/30/11 Faxed records to Rene Paci, RN @ [employer] [phone] with the change in treatment to Ixempra J9207. Her phone number is [phone] ext. [phone].  05/02/11 Fax from [employer] requesting addition information. I left a message for someone to call me and let me know what they needed. Ref #[id number].  05/03/11 Call  Emilee Hero 05/08/2011 1:34 PM Incomplete Revision  04/30/11 Faxed records to Rene Paci, RN @ [facility] [phone] with the change in treatment to Ixempra Z6010. Her phone number is [phone] ext. [phone].  05/02/11 Fax from [employer] requesting additional information. I left a message for someone to call me and let me know what they needed. Ref #[id number].  05/03/11 Call from Rise Paganini @ [employer] stating it has been approved.          Created by     Emilee Hero on 05/03/2011 11:26 AM

## 2011-05-10 ENCOUNTER — Telehealth: Payer: Self-pay | Admitting: *Deleted

## 2011-05-10 NOTE — Telephone Encounter (Signed)
PT. WAS SEEN AT AN URGENT CARE. SHE GAVE AN UPDATE AND A LIST OF MEDICATIONS PRESCRIBE. LEVAQUIN, PRO AIR INHALER, PREDNISONE, AND COUGH SYRUP. PT. WILL CALL Monday WITH ANOTHER UPDATE.

## 2011-05-10 NOTE — Telephone Encounter (Signed)
Pt called and stated she is out of town in [place], [place] and has head cold with cough that is unrelieved with OTC medications.  Pt is requesting rx for cough medication.   RN advised patient that MD is unable to call out of state medication.  Advised that patient visit urgent care.  Pt verbalized understanding.

## 2011-05-14 ENCOUNTER — Encounter: Payer: Self-pay | Admitting: *Deleted

## 2011-05-15 ENCOUNTER — Ambulatory Visit (HOSPITAL_BASED_OUTPATIENT_CLINIC_OR_DEPARTMENT_OTHER): Payer: PRIVATE HEALTH INSURANCE

## 2011-05-15 ENCOUNTER — Telehealth: Payer: Self-pay | Admitting: Oncology

## 2011-05-15 ENCOUNTER — Other Ambulatory Visit: Payer: Self-pay | Admitting: Oncology

## 2011-05-15 ENCOUNTER — Other Ambulatory Visit (HOSPITAL_BASED_OUTPATIENT_CLINIC_OR_DEPARTMENT_OTHER): Payer: PRIVATE HEALTH INSURANCE | Admitting: Lab

## 2011-05-15 ENCOUNTER — Ambulatory Visit (HOSPITAL_BASED_OUTPATIENT_CLINIC_OR_DEPARTMENT_OTHER): Payer: PRIVATE HEALTH INSURANCE | Admitting: Oncology

## 2011-05-15 VITALS — BP 130/71 | HR 81 | Temp 98.1°F | Ht 65.0 in | Wt 183.8 lb

## 2011-05-15 DIAGNOSIS — C50919 Malignant neoplasm of unspecified site of unspecified female breast: Secondary | ICD-10-CM

## 2011-05-15 DIAGNOSIS — C7970 Secondary malignant neoplasm of unspecified adrenal gland: Secondary | ICD-10-CM

## 2011-05-15 DIAGNOSIS — Z5111 Encounter for antineoplastic chemotherapy: Secondary | ICD-10-CM

## 2011-05-15 DIAGNOSIS — C801 Malignant (primary) neoplasm, unspecified: Secondary | ICD-10-CM

## 2011-05-15 DIAGNOSIS — C773 Secondary and unspecified malignant neoplasm of axilla and upper limb lymph nodes: Secondary | ICD-10-CM

## 2011-05-15 LAB — CBC WITH DIFFERENTIAL/PLATELET
Basophils Absolute: 0 10*3/uL (ref 0.0–0.1)
Eosinophils Absolute: 0 10*3/uL (ref 0.0–0.5)
HGB: 10.8 g/dL — ABNORMAL LOW (ref 11.6–15.9)
NEUT#: 9.7 10*3/uL — ABNORMAL HIGH (ref 1.5–6.5)
RDW: 14.5 % (ref 11.2–14.5)
lymph#: 2 10*3/uL (ref 0.9–3.3)

## 2011-05-15 LAB — COMPREHENSIVE METABOLIC PANEL
AST: 27 U/L (ref 0–37)
Albumin: 3.5 g/dL (ref 3.5–5.2)
Alkaline Phosphatase: 74 U/L (ref 39–117)
Potassium: 4.4 mEq/L (ref 3.5–5.3)
Sodium: 138 mEq/L (ref 135–145)
Total Protein: 6 g/dL (ref 6.0–8.3)

## 2011-05-15 MED ORDER — FAMOTIDINE IN NACL 20-0.9 MG/50ML-% IV SOLN
20.0000 mg | Freq: Once | INTRAVENOUS | Status: AC
  Administered 2011-05-15: 20 mg via INTRAVENOUS

## 2011-05-15 MED ORDER — HEPARIN SOD (PORK) LOCK FLUSH 100 UNIT/ML IV SOLN
500.0000 [IU] | Freq: Once | INTRAVENOUS | Status: AC | PRN
  Administered 2011-05-15: 500 [IU]
  Filled 2011-05-15: qty 5

## 2011-05-15 MED ORDER — DEXAMETHASONE SODIUM PHOSPHATE 4 MG/ML IJ SOLN
20.0000 mg | Freq: Once | INTRAMUSCULAR | Status: AC
  Administered 2011-05-15: 20 mg via INTRAVENOUS

## 2011-05-15 MED ORDER — ONDANSETRON 16 MG/50ML IVPB (CHCC)
16.0000 mg | Freq: Once | INTRAVENOUS | Status: AC
  Administered 2011-05-15: 16 mg via INTRAVENOUS

## 2011-05-15 MED ORDER — SODIUM CHLORIDE 0.9 % IV SOLN
Freq: Once | INTRAVENOUS | Status: AC
  Administered 2011-05-15: 12:00:00 via INTRAVENOUS

## 2011-05-15 MED ORDER — IXABEPILONE CHEMO INJECTION 45 MG
40.0000 mg/m2 | Freq: Once | INTRAVENOUS | Status: AC
  Administered 2011-05-15: 78 mg via INTRAVENOUS
  Filled 2011-05-15: qty 39

## 2011-05-15 MED ORDER — DIPHENHYDRAMINE HCL 25 MG PO CAPS: 25.0000 mg | ORAL_CAPSULE | Freq: Once | ORAL | Status: DC

## 2011-05-15 MED ORDER — SODIUM CHLORIDE 0.9 % IJ SOLN
10.0000 mL | INTRAMUSCULAR | Status: DC | PRN
  Administered 2011-05-15: 10 mL
  Filled 2011-05-15: qty 10

## 2011-05-15 MED ORDER — DIPHENHYDRAMINE HCL 50 MG/ML IJ SOLN
50.0000 mg | Freq: Once | INTRAMUSCULAR | Status: AC
  Administered 2011-05-15: 50 mg via INTRAVENOUS

## 2011-05-15 MED ORDER — TRASTUZUMAB CHEMO INJECTION 440 MG
6.0000 mg/kg | Freq: Once | INTRAVENOUS | Status: AC
  Administered 2011-05-15: 504 mg via INTRAVENOUS
  Filled 2011-05-15: qty 24

## 2011-05-15 MED ORDER — ACETAMINOPHEN 325 MG PO TABS
650.0000 mg | ORAL_TABLET | Freq: Once | ORAL | Status: AC
  Administered 2011-05-15: 650 mg via ORAL

## 2011-05-15 NOTE — Telephone Encounter (Signed)
gv pt appt for dec-jan2013 °

## 2011-05-15 NOTE — Progress Notes (Signed)
ID: [name]   Interval History: Rebecca Pugh returns today with her husband Rebecca Pugh for followup of her breast cancer. Today is day 1 cycle 2 of ixabepilone. She did fairly well with cycle 1, with minimal nausea, some achiness lasting up to 5 days (relieved with naproxen), and minimal peripheral neuropathy, with a little feeling of dullness in her fingertips which has since completely resolved. She N. Rebecca Pugh visited her relatives in [place] over the Thanksgiving holidays and enjoyed that.  ROS:  A detailed review of systems was otherwise stable. She does describe herself is moderately fatigued. She has had some runny nose and sinus issues. She has had a productive cough and received antibiotics for this while visiting [place] over the holidays. She has had no diarrhea or constipation problems, no rash fever or or bleeding. The detailed review of systems was otherwise noncontributory.  Medications: I have reviewed the patient's current medications.  Current Outpatient Prescriptions  Medication Sig Dispense Refill  . calcium citrate (CALCITRATE - DOSED IN MG ELEMENTAL CALCIUM) 950 MG tablet Take 1 tablet by mouth daily.        . cholecalciferol (VITAMIN D) 1000 UNITS tablet Take 3,000 Units by mouth daily.        . lapatinib (TYKERB) 250 MG tablet Take 1,500 mg by mouth daily.        Marland Kitchen LORazepam (ATIVAN) 0.5 MG tablet Take 0.5 mg by mouth 2 (two) times daily as needed.        . tamoxifen (NOLVADEX) 20 MG tablet Take 20 mg by mouth daily.        Marland Kitchen venlafaxine (EFFEXOR-XR) 75 MG 24 hr capsule Take 75 mg by mouth daily.         No current facility-administered medications for this visit.   Facility-Administered Medications Ordered in Other Visits  Medication Dose Route Frequency Provider Last Rate Last Dose  . 0.9 %  sodium chloride infusion   Intravenous Once Lowella Dell, MD      . acetaminophen (TYLENOL) tablet 650 mg  650 mg Oral Once Lowella Dell, MD   650 mg at 05/15/11 1215  .  dexamethasone (DECADRON) injection 20 mg  20 mg Intravenous Once Lowella Dell, MD   20 mg at 05/15/11 1215  . diphenhydrAMINE (BENADRYL) capsule 25 mg  25 mg Oral Once Lowella Dell, MD      . diphenhydrAMINE (BENADRYL) injection 50 mg  50 mg Intravenous Once Lowella Dell, MD   50 mg at 05/15/11 1215  . famotidine (PEPCID) IVPB 20 mg  20 mg Intravenous Once Lowella Dell, MD   20 mg at 05/15/11 1245  . heparin lock flush 100 unit/mL  500 Units Intracatheter Once PRN Lowella Dell, MD   500 Units at 05/15/11 1658  . ixabepilone (IXEMPRA) 78 mg in lactated ringers 250 mL chemo infusion  40 mg/m2 (Order-Specific) Intravenous Once Lowella Dell, MD   78 mg at 05/15/11 1355  . ondansetron (ZOFRAN) IVPB 16 mg  16 mg Intravenous Once Lowella Dell, MD   16 mg at 05/15/11 1214  . sodium chloride 0.9 % injection 10 mL  10 mL Intracatheter PRN Lowella Dell, MD   10 mL at 05/15/11 1658  . trastuzumab (HERCEPTIN) 504 mg in sodium chloride 0.9 % 250 mL chemo infusion  6 mg/kg (Order-Specific) Intravenous Once Lowella Dell, MD   504 mg at 05/15/11 1316     Objective: Vital signs in last 24 hours: BP  130/71  Pulse 81  Temp(Src) 98.1 F (36.7 C) (Oral)  Ht 5\' 5"  (1.651 m)  Wt 183 lb 12.8 oz (83.371 kg)  BMI 30.59 kg/m2   Physical Exam:    Sclerae unicteric  Oropharynx clear  No peripheral adenopathy  Lungs clear -- no rales or rhonchi  Heart regular rate and rhythm  Abdomen benign  MSK no focal spinal tenderness, no peripheral edema  Neuro nonfocal  Breast exam: Right breast no suspicious findings. Left breast status post modified radical mastectomy. No evidence of local recurrence.  Lab Results: Her CA 27-29 which had continued to rise despite the earlier treatment and peaked at 49 early November, has started to drop and is now 45.  CMP   Lab 05/15/11 1004  NA 138  K 4.4  CL 102  CO2 27  BUN 25*  CREATININE 1.04  CALCIUM 8.8  PROT 6.0  BILITOT  0.5  ALKPHOS 74  ALT 36*  AST 27  GLUCOSE 102*     CBC Lab Results  Component Value Date   WBC 11.8* 05/15/2011   HGB 10.8* 05/15/2011   HCT 32.0* 05/15/2011   MCV 89.9 05/15/2011   PLT 274 05/15/2011    Studies/Results:     No results found.  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   Plan: She will receive cycle 2 of ixabepilone today. I am not making any changes to her doses but did suggest she start nonsteroidals earlier instead of waiting until the fourth or fifth day of treatment, to taking better care of the aches and pains associated with this therapy. We will see her on a Q. three-week basis, on the day of treatment, and when I see her in January I will set her up for restaging studies after her fourth cycle of ixabepilone. She will need a repeat echo at that time as well.  Rebecca Pugh continues to be somewhat discouraged. I have explained to her we have many treatments for her including of course the new Tandem anti- HER-2 combination drug  which  we expect will be approved early next year. This is not a curable malignancy and there will be remissions and relapses. If we do get a good response from the current treatment, however, I would vote for an addition four cycles (6 months in all) before switching to fulvestrant (with continuing anti-HER-2 therapy.  )MAGRINAT,GUSTAV C 05/15/2011

## 2011-05-20 ENCOUNTER — Other Ambulatory Visit: Payer: Self-pay | Admitting: Oncology

## 2011-05-20 DIAGNOSIS — C50919 Malignant neoplasm of unspecified site of unspecified female breast: Secondary | ICD-10-CM

## 2011-05-21 ENCOUNTER — Telehealth: Payer: Self-pay | Admitting: *Deleted

## 2011-05-21 DIAGNOSIS — C50919 Malignant neoplasm of unspecified site of unspecified female breast: Secondary | ICD-10-CM

## 2011-05-21 MED ORDER — HYDROCODONE-ACETAMINOPHEN 5-500 MG PO TABS: 2.0000 | ORAL_TABLET | Freq: Three times a day (TID) | ORAL | Status: AC | PRN

## 2011-05-21 NOTE — Telephone Encounter (Signed)
Pt called to state onset of severe bilateral leg and back pain occurring last night " so bad I didn't get any sleep '  Per discussion 2nd cycle of Xempra given 11/28 on q 3 week dosing. Pt had some discomfort with 1st cycle but was relieved with advil.  Above symptoms discussed and prescription for stronger pain medication will be called to pharmacy.

## 2011-05-29 ENCOUNTER — Ambulatory Visit: Payer: PRIVATE HEALTH INSURANCE

## 2011-05-29 ENCOUNTER — Other Ambulatory Visit: Payer: PRIVATE HEALTH INSURANCE | Admitting: Lab

## 2011-06-05 ENCOUNTER — Encounter: Payer: Self-pay | Admitting: Physician Assistant

## 2011-06-05 ENCOUNTER — Ambulatory Visit: Payer: PRIVATE HEALTH INSURANCE

## 2011-06-05 ENCOUNTER — Telehealth: Payer: Self-pay | Admitting: Physician Assistant

## 2011-06-05 ENCOUNTER — Ambulatory Visit (HOSPITAL_BASED_OUTPATIENT_CLINIC_OR_DEPARTMENT_OTHER): Payer: PRIVATE HEALTH INSURANCE

## 2011-06-05 ENCOUNTER — Ambulatory Visit (HOSPITAL_BASED_OUTPATIENT_CLINIC_OR_DEPARTMENT_OTHER): Payer: PRIVATE HEALTH INSURANCE | Admitting: Physician Assistant

## 2011-06-05 ENCOUNTER — Other Ambulatory Visit (HOSPITAL_BASED_OUTPATIENT_CLINIC_OR_DEPARTMENT_OTHER): Payer: PRIVATE HEALTH INSURANCE | Admitting: Lab

## 2011-06-05 ENCOUNTER — Telehealth: Payer: Self-pay | Admitting: Oncology

## 2011-06-05 VITALS — BP 160/84 | HR 75 | Temp 97.5°F | Ht 65.0 in | Wt 177.9 lb

## 2011-06-05 DIAGNOSIS — Z9010 Acquired absence of unspecified breast and nipple: Secondary | ICD-10-CM

## 2011-06-05 DIAGNOSIS — C50919 Malignant neoplasm of unspecified site of unspecified female breast: Secondary | ICD-10-CM

## 2011-06-05 DIAGNOSIS — C801 Malignant (primary) neoplasm, unspecified: Secondary | ICD-10-CM

## 2011-06-05 DIAGNOSIS — Z5111 Encounter for antineoplastic chemotherapy: Secondary | ICD-10-CM

## 2011-06-05 DIAGNOSIS — Z923 Personal history of irradiation: Secondary | ICD-10-CM

## 2011-06-05 DIAGNOSIS — Z79899 Other long term (current) drug therapy: Secondary | ICD-10-CM

## 2011-06-05 LAB — COMPREHENSIVE METABOLIC PANEL
ALT: 28 U/L (ref 0–35)
AST: 21 U/L (ref 0–37)
Alkaline Phosphatase: 96 U/L (ref 39–117)
BUN: 14 mg/dL (ref 6–23)
Chloride: 104 mEq/L (ref 96–112)
Creatinine, Ser: 0.93 mg/dL (ref 0.50–1.10)
Total Bilirubin: 0.6 mg/dL (ref 0.3–1.2)

## 2011-06-05 LAB — CBC WITH DIFFERENTIAL/PLATELET
Eosinophils Absolute: 0.4 10*3/uL (ref 0.0–0.5)
HCT: 33 % — ABNORMAL LOW (ref 34.8–46.6)
LYMPH%: 23.3 % (ref 14.0–49.7)
MCHC: 33.3 g/dL (ref 31.5–36.0)
MCV: 86.6 fL (ref 79.5–101.0)
MONO%: 14.5 % — ABNORMAL HIGH (ref 0.0–14.0)
NEUT#: 2.6 10*3/uL (ref 1.5–6.5)
NEUT%: 53.6 % (ref 38.4–76.8)
Platelets: 337 10*3/uL (ref 145–400)
RBC: 3.81 10*6/uL (ref 3.70–5.45)
nRBC: 0 % (ref 0–0)

## 2011-06-05 MED ORDER — SODIUM CHLORIDE 0.9 % IV SOLN: Freq: Once | INTRAVENOUS | Status: DC

## 2011-06-05 MED ORDER — DIPHENHYDRAMINE HCL 50 MG/ML IJ SOLN
50.0000 mg | Freq: Once | INTRAMUSCULAR | Status: AC
  Administered 2011-06-05: 50 mg via INTRAVENOUS

## 2011-06-05 MED ORDER — SODIUM CHLORIDE 0.9 % IJ SOLN
10.0000 mL | INTRAMUSCULAR | Status: DC | PRN
  Administered 2011-06-05: 10 mL
  Filled 2011-06-05: qty 10

## 2011-06-05 MED ORDER — LACTATED RINGERS IV SOLN
75.0000 mg | Freq: Once | INTRAVENOUS | Status: AC
  Administered 2011-06-05: 75 mg via INTRAVENOUS
  Filled 2011-06-05: qty 37.5

## 2011-06-05 MED ORDER — ONDANSETRON 16 MG/50ML IVPB (CHCC)
16.0000 mg | Freq: Once | INTRAVENOUS | Status: AC
  Administered 2011-06-05: 16 mg via INTRAVENOUS

## 2011-06-05 MED ORDER — ACETAMINOPHEN 325 MG PO TABS
650.0000 mg | ORAL_TABLET | Freq: Once | ORAL | Status: AC
  Administered 2011-06-05: 650 mg via ORAL

## 2011-06-05 MED ORDER — TRASTUZUMAB CHEMO INJECTION 440 MG
6.0000 mg/kg | Freq: Once | INTRAVENOUS | Status: AC
  Administered 2011-06-05: 504 mg via INTRAVENOUS
  Filled 2011-06-05: qty 24

## 2011-06-05 MED ORDER — FAMOTIDINE IN NACL 20-0.9 MG/50ML-% IV SOLN
20.0000 mg | Freq: Once | INTRAVENOUS | Status: AC
  Administered 2011-06-05: 20 mg via INTRAVENOUS

## 2011-06-05 MED ORDER — HEPARIN SOD (PORK) LOCK FLUSH 100 UNIT/ML IV SOLN
500.0000 [IU] | Freq: Once | INTRAVENOUS | Status: AC | PRN
  Administered 2011-06-05: 500 [IU]
  Filled 2011-06-05: qty 5

## 2011-06-05 MED ORDER — DEXAMETHASONE SODIUM PHOSPHATE 4 MG/ML IJ SOLN
20.0000 mg | Freq: Once | INTRAMUSCULAR | Status: AC
  Administered 2011-06-05: 20 mg via INTRAVENOUS

## 2011-06-05 NOTE — Telephone Encounter (Signed)
Gv pt appt for dec-jan2013 ° °

## 2011-06-05 NOTE — Telephone Encounter (Signed)
Spoke with patient RE: CMET results today.  Looks good.  K+ on lower end of normal.  Encourage patient to stay on supplement and keep herself well hydrated following chemo.  Will recheck labs next week, 06/13/11.  Patient voiced understanding of the above.

## 2011-06-05 NOTE — Patient Instructions (Signed)
Patient discharged home with no complaints; patient aware of next appointment.

## 2011-06-05 NOTE — Progress Notes (Signed)
ID: Rebecca Pugh   Interval History: Rebecca Pugh returns today with her husband Rebecca Pugh for followup of her metastatic breast cancer. Today is day 1 cycle 3 of ixabepilone, given along with trastuzumab, in addition to daily lapatinib.   The patient had very poor tolerance of cycle 2. Her primary complaints are fatigue, taste aversion, joint pains, and muscle cramping. She is extremely tired, and tells me it was difficult to walk even 100 feet without resting for several days following the last dose. She is certain that she became dehydrated, and she was unable to eat or drink anything for a few days following treatment. She was also having diarrhea for which she took Imodium. I have cautioned her to let us know immediately if this happens again, and I offered her IV fluids following treatment. Basically, Rebecca Pugh tells me that everything tastes bad, and she simply can't make herself eat. She has found that "smart water" tastes good, and is trying to drink that now. Not only did she have joint pain which was treated with hydrocodone/APAP, but she began to have severe cramps in her hands and feet, possibly related to hypokalemia. She is on a potassium supplement.  At this point she is eating a little more, but has lost 6 or 7 pounds since her visit here 3 weeks ago. She has a little queasiness on occasion, but no frank nausea or emesis. The diarrhea has resolved at this point. She's had no signs of abnormal bleeding, or blood in the stool. No rashes or skin changes. Her fingers tingling on occasion, but this comes and goes, and is not affecting her day-to-day activity. No mouth ulcers or oral sensitivity. No problems swallowing.  The patient is obviously anxious on presentation today, and although ready to proceed with her third dose of ixabepilone today, she is very concerned about not being able to tolerate this regimen.   A detailed review of systems is otherwise noncontributory as noted below.  Review of  Systems: Constitutional:  fatigued, weight loss and no fever Eyes: negative [id number] Cardiovascular: positive for - shortness of breath negative for - chest pain, edema or orthopnea Respiratory: positive for - shortness of breath negative for - cough, hemoptysis, orthopnea or pleuritic pain Neurological: positive for - slight intermittent numbness/tingling in fingers Dermatological: negative Gastrointestinal: positive for - appetite loss and diarrhea negative for - abdominal pain, blood in stools, nausea/vomiting or swallowing difficulty/pain Genito-Urinary: no dysuria, trouble voiding, or hematuria Hematological and Lymphatic: negative Breast: negative Musculoskeletal: positive for - joint pain and muscular weakness Remaining ROS negative.    Medications: I have reviewed the patient's current medications.  Current Outpatient Prescriptions  Medication Sig Dispense Refill  . calcium citrate (CALCITRATE - DOSED IN MG ELEMENTAL CALCIUM) 950 MG tablet Take 1 tablet by mouth daily.        . diphenhydrAMINE (SOMINEX) 25 MG tablet Take 25 mg by mouth at bedtime as needed.        . hydrocodone-acetaminophen (LORCET-HD) 5-500 MG per capsule Take 1 capsule by mouth every 6 (six) hours as needed.        . lapatinib (TYKERB) 250 MG tablet Take 1,500 mg by mouth daily.        Marland Kitchen loperamide (IMODIUM) 2 MG capsule Take 2 mg by mouth 4 (four) times daily as needed.        Marland Kitchen LORazepam (ATIVAN) 0.5 MG tablet Take 0.5 mg by mouth 2 (two) times daily as needed.        Marland Kitchen  potassium chloride (KLOR-CON) 20 MEQ packet Take 20 mEq by mouth 2 (two) times daily.        . prochlorperazine (COMPAZINE) 10 MG tablet Take 10 mg by mouth every 6 (six) hours as needed.        . venlafaxine (EFFEXOR-XR) 75 MG 24 hr capsule TAKE ONE CAPSULE BY MOUTH EVERY DAY  30 capsule  6  . cholecalciferol (VITAMIN D) 1000 UNITS tablet Take 3,000 Units by mouth daily.           Physical Exam: Filed Vitals:   06/05/11 1053    BP: 160/84  Pulse: 75  Temp: 97.5 F (36.4 C)   HEENT:  Sclerae anicteric, conjunctivae pink.  Oropharynx clear, pink and moist. No mucositis or candidiasis.   Nodes:  No cervical, supraclavicular, or axillary lymphadenopathy palpated.  Breast Exam:  Deferred   Lungs:  Clear to auscultation bilaterally with no crackles, rhonchi, or wheezes.  Breath sounds are slightly diminished bibasilar, greater on the right than the left. It should she's here in Heart:  Regular rate and rhythm.   Abdomen:  Soft, nontender.  Positive bowel sounds.  No organomegaly or masses palpated.   Musculoskeletal:  No focal spinal tenderness to palpation.  Extremities:  Benign.  No peripheral edema or cyanosis.   Skin:  Benign.   Neuro:  Nonfocal.   Lab Results:  Her CA 27-29 which had continued to rise despite the earlier treatment and peaked at 62 early November, has started to drop and is now 87.  CMP  pending  CBC Lab Results  Component Value Date   WBC 4.9 06/05/2011   HGB 11.0* 06/05/2011   HCT 33.0* 06/05/2011   MCV 86.6 06/05/2011   PLT 337 06/05/2011    Studies/Results:     No results found.  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   Plan: The patient will proceed to treatment today as scheduled for day 1 cycle 3 of ixabepilone with trastuzumab. We will recheck her metabolic panel, specifically her potassium and calcium in light of the diarrhea and associated muscle cramping. I have encouraged her to keep herself well hydrated, and to let us know if she is unable to do so. At this point she declines supportive IV fluids following treatment, but we can certainly try to schedule her for those as needed. Also, I have asked her to contact us if she has severe diarrhea again. At that point we will likely need to hold her lapatinib until it resolves.  Patient will return next week on December 27 for labs alone. She will see [name] in 3 weeks on January 9. This  plan is to order restaging studies and an echocardiogram at that visit after 4 cycles of this regimen.  According to Dr. Darrall Dears previous notes, if we do get a good response from the current treatment (and the patient is able to tolerate it) he would suggest an additional 4 cycles, for a total of 6 months before switching to Faslodex, and continuing on HER-2/neu therapy.   The patient and her husband both voice understanding and agreement with our plan. They know to call with any changes or problems.   Lycan Davee, PA-C 06/05/2011

## 2011-06-13 ENCOUNTER — Other Ambulatory Visit (HOSPITAL_BASED_OUTPATIENT_CLINIC_OR_DEPARTMENT_OTHER): Payer: PRIVATE HEALTH INSURANCE | Admitting: Lab

## 2011-06-13 DIAGNOSIS — C50919 Malignant neoplasm of unspecified site of unspecified female breast: Secondary | ICD-10-CM

## 2011-06-13 LAB — COMPREHENSIVE METABOLIC PANEL
Albumin: 3.8 g/dL (ref 3.5–5.2)
Alkaline Phosphatase: 93 U/L (ref 39–117)
BUN: 20 mg/dL (ref 6–23)
CO2: 23 mEq/L (ref 19–32)
Calcium: 9.8 mg/dL (ref 8.4–10.5)
Chloride: 98 mEq/L (ref 96–112)
Glucose, Bld: 242 mg/dL — ABNORMAL HIGH (ref 70–99)
Potassium: 4.5 mEq/L (ref 3.5–5.3)
Sodium: 134 mEq/L — ABNORMAL LOW (ref 135–145)
Total Protein: 7.2 g/dL (ref 6.0–8.3)

## 2011-06-13 LAB — CBC WITH DIFFERENTIAL/PLATELET
Basophils Absolute: 0 10*3/uL (ref 0.0–0.1)
Eosinophils Absolute: 0.1 10*3/uL (ref 0.0–0.5)
HGB: 11.9 g/dL (ref 11.6–15.9)
MCV: 87.7 fL (ref 79.5–101.0)
MONO#: 0.1 10*3/uL (ref 0.1–0.9)
MONO%: 5.1 % (ref 0.0–14.0)
NEUT#: 1.2 10*3/uL — ABNORMAL LOW (ref 1.5–6.5)
RBC: 3.98 10*6/uL (ref 3.70–5.45)
RDW: 14.2 % (ref 11.2–14.5)
WBC: 2.4 10*3/uL — ABNORMAL LOW (ref 3.9–10.3)
lymph#: 1 10*3/uL (ref 0.9–3.3)

## 2011-06-14 ENCOUNTER — Telehealth: Payer: Self-pay | Admitting: *Deleted

## 2011-06-14 NOTE — Telephone Encounter (Signed)
Pt left message for AB/PA stating diarrhea is continuing and she will continue to hold the [place] today and over the WE.  She will resume post WE if diarrhea subsides.  AB/PA made aware of above.

## 2011-06-26 ENCOUNTER — Ambulatory Visit (HOSPITAL_BASED_OUTPATIENT_CLINIC_OR_DEPARTMENT_OTHER): Payer: PRIVATE HEALTH INSURANCE

## 2011-06-26 ENCOUNTER — Other Ambulatory Visit (HOSPITAL_BASED_OUTPATIENT_CLINIC_OR_DEPARTMENT_OTHER): Payer: PRIVATE HEALTH INSURANCE | Admitting: Lab

## 2011-06-26 ENCOUNTER — Ambulatory Visit (HOSPITAL_BASED_OUTPATIENT_CLINIC_OR_DEPARTMENT_OTHER): Payer: PRIVATE HEALTH INSURANCE | Admitting: Oncology

## 2011-06-26 VITALS — BP 169/89 | HR 80 | Temp 97.4°F | Ht 65.0 in | Wt 173.6 lb

## 2011-06-26 DIAGNOSIS — Z5112 Encounter for antineoplastic immunotherapy: Secondary | ICD-10-CM

## 2011-06-26 DIAGNOSIS — Z5111 Encounter for antineoplastic chemotherapy: Secondary | ICD-10-CM

## 2011-06-26 DIAGNOSIS — C7970 Secondary malignant neoplasm of unspecified adrenal gland: Secondary | ICD-10-CM

## 2011-06-26 DIAGNOSIS — C773 Secondary and unspecified malignant neoplasm of axilla and upper limb lymph nodes: Secondary | ICD-10-CM

## 2011-06-26 DIAGNOSIS — C50919 Malignant neoplasm of unspecified site of unspecified female breast: Secondary | ICD-10-CM

## 2011-06-26 DIAGNOSIS — C801 Malignant (primary) neoplasm, unspecified: Secondary | ICD-10-CM

## 2011-06-26 LAB — CBC WITH DIFFERENTIAL/PLATELET
EOS%: 1.4 % (ref 0.0–7.0)
Eosinophils Absolute: 0.1 10*3/uL (ref 0.0–0.5)
HGB: 10.6 g/dL — ABNORMAL LOW (ref 11.6–15.9)
MCV: 87.5 fL (ref 79.5–101.0)
MONO%: 8.5 % (ref 0.0–14.0)
NEUT#: 5.6 10*3/uL (ref 1.5–6.5)
RBC: 3.69 10*6/uL — ABNORMAL LOW (ref 3.70–5.45)
RDW: 15.2 % — ABNORMAL HIGH (ref 11.2–14.5)
lymph#: 1.6 10*3/uL (ref 0.9–3.3)
nRBC: 0 % (ref 0–0)

## 2011-06-26 MED ORDER — LACTATED RINGERS IV SOLN
75.0000 mg | Freq: Once | INTRAVENOUS | Status: AC
  Administered 2011-06-26: 75 mg via INTRAVENOUS
  Filled 2011-06-26: qty 37.5

## 2011-06-26 MED ORDER — DIPHENHYDRAMINE HCL 50 MG/ML IJ SOLN
50.0000 mg | Freq: Once | INTRAMUSCULAR | Status: AC
  Administered 2011-06-26: 50 mg via INTRAVENOUS

## 2011-06-26 MED ORDER — SODIUM CHLORIDE 0.9 % IJ SOLN
10.0000 mL | INTRAMUSCULAR | Status: DC | PRN
  Administered 2011-06-26: 10 mL
  Filled 2011-06-26: qty 10

## 2011-06-26 MED ORDER — ONDANSETRON 16 MG/50ML IVPB (CHCC)
16.0000 mg | Freq: Once | INTRAVENOUS | Status: AC
  Administered 2011-06-26: 16 mg via INTRAVENOUS

## 2011-06-26 MED ORDER — SODIUM CHLORIDE 0.9 % IV SOLN
Freq: Once | INTRAVENOUS | Status: AC
  Administered 2011-06-26: 12:00:00 via INTRAVENOUS

## 2011-06-26 MED ORDER — HEPARIN SOD (PORK) LOCK FLUSH 100 UNIT/ML IV SOLN
500.0000 [IU] | Freq: Once | INTRAVENOUS | Status: AC | PRN
  Administered 2011-06-26: 500 [IU]
  Filled 2011-06-26: qty 5

## 2011-06-26 MED ORDER — DEXAMETHASONE SODIUM PHOSPHATE 4 MG/ML IJ SOLN
20.0000 mg | Freq: Once | INTRAMUSCULAR | Status: AC
  Administered 2011-06-26: 20 mg via INTRAVENOUS

## 2011-06-26 MED ORDER — FAMOTIDINE IN NACL 20-0.9 MG/50ML-% IV SOLN
20.0000 mg | Freq: Once | INTRAVENOUS | Status: AC
  Administered 2011-06-26: 20 mg via INTRAVENOUS

## 2011-06-26 MED ORDER — ACETAMINOPHEN 325 MG PO TABS
650.0000 mg | ORAL_TABLET | Freq: Once | ORAL | Status: AC
  Administered 2011-06-26: 650 mg via ORAL

## 2011-06-26 MED ORDER — TRASTUZUMAB CHEMO INJECTION 440 MG
6.0000 mg/kg | Freq: Once | INTRAVENOUS | Status: AC
  Administered 2011-06-26: 504 mg via INTRAVENOUS
  Filled 2011-06-26: qty 24

## 2011-06-26 MED ORDER — DIPHENHYDRAMINE HCL 25 MG PO CAPS: 25.0000 mg | ORAL_CAPSULE | Freq: Once | ORAL | Status: DC

## 2011-06-26 NOTE — Progress Notes (Signed)
Patient ID: Rebecca Pugh, female   DOB: 01-30-1951, 61 y.o.   MRN: [id number] ID: [name]   Interval History: Rebecca Pugh returns today with her husband Gene for followup of her metastatic breast cancer. She did okay over the holidays, eating some, not doing any cooking or cleaning. "There were taking good care of me". Donnamarie Poag has a training trip to [place] in the last week of this month and they would like [place] to change the treatment dates accordingly. This is discussed further below.  Review of Systems: Overall she is tolerating the treatments without major issues, but they are a struggle. They'll lapatinib causes or diarrhea, and when she tries to take 6 tablets daily she ends up not taking it every third day or so as a result. I think she will do better taking 4 tablets daily and we will see whether she can tolerate that long-term. The ixabepilone causes her to be significantly fatigued for about 5 days,after Olivia Mackie starts to feel better, but never really well. 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 It is certainly grade 1.  Recently she had a "cold," without fever, purulent sputum, shortness of breath, hemoptysis, or pleurisy. She has significant taste per version and some nausea but no vomiting. She feels forgetful and a little bit on the depressed side. Otherwise a detailed review of systems was noncontributory  Medications: I have reviewed the patient's current medications.  Current Outpatient Prescriptions  Medication Sig Dispense Refill  . calcium citrate (CALCITRATE - DOSED IN MG ELEMENTAL CALCIUM) 950 MG tablet Take 1 tablet by mouth daily.        . cholecalciferol (VITAMIN D) 1000 UNITS tablet Take 3,000 Units by mouth daily.        . diphenhydrAMINE (SOMINEX) 25 MG tablet Take 25 mg by mouth at bedtime as needed.        . hydrocodone-acetaminophen (LORCET-HD) 5-500 MG per capsule Take  1 capsule by mouth every 6 (six) hours as needed.        . lapatinib (TYKERB) 250 MG tablet Take 1,500 mg by mouth daily.        Marland Kitchen loperamide (IMODIUM) 2 MG capsule Take 2 mg by mouth 4 (four) times daily as needed.        Marland Kitchen LORazepam (ATIVAN) 0.5 MG tablet Take 0.5 mg by mouth 2 (two) times daily as needed.        . potassium chloride (KLOR-CON) 20 MEQ packet Take 20 mEq by mouth 2 (two) times daily.        . prochlorperazine (COMPAZINE) 10 MG tablet Take 10 mg by mouth every 6 (six) hours as needed.        . venlafaxine (EFFEXOR-XR) 75 MG 24 hr capsule TAKE ONE CAPSULE BY MOUTH EVERY DAY  30 capsule  6     Physical Exam: Filed Vitals:   06/26/11 1015  BP: 169/89  Pulse: 80  Temp: 97.4 F (36.3 C)   HEENT:  Sclerae anicteric, conjunctivae pink.  Oropharynx clear, pink and moist. No mucositis or candidiasis.   Nodes:  No cervical, supraclavicular, or axillary lymphadenopathy palpated.  Breast Exam:  Right breast no suspicious findings. Left breast status post mastectomy. There is no evidence of local recurrence Lungs:  Clear to auscultation bilaterally with no crackles, rhonchi, or wheezes.  Breath sounds are slightly diminished bibasilar, greater on the right than the left. It should she's here in  Heart:  Regular rate and rhythm.   Abdomen:  Soft, nontender.  Positive bowel sounds.  No organomegaly or masses palpated.   Musculoskeletal:  No focal spinal tenderness to palpation.  Extremities:  Benign.  No peripheral edema or cyanosis.   Skin:  Benign.   Neuro:  Nonfocal.   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   Plan: She will be treated today, and the only change making her treatment is dropping the lapatinib to 4 tablets daily. We're going to obtain labwork ADN at the end of this month, including a cell search. She will see me again every 6, and we will do CT scans of the chest abdomen and pelvis prior to that visit. I am hopeful that at that point we will be able to switch from ixabepilone to Faslodex, while continuing the trastuzumab every 3 weeks and the lapatinib daily. Because of their trip to [place], the next trastuzumab will be given February 6 and I will see her that same day. She will also have an echocardiogram under Dr. Gala Romney prior to that visit  Lacretia Tindall C, PA-C 06/26/2011

## 2011-07-03 ENCOUNTER — Other Ambulatory Visit (HOSPITAL_COMMUNITY): Payer: PRIVATE HEALTH INSURANCE

## 2011-07-10 ENCOUNTER — Ambulatory Visit (HOSPITAL_COMMUNITY)
Admission: RE | Admit: 2011-07-10 | Discharge: 2011-07-10 | Disposition: A | Discharge status: Home / Self Care | Payer: PRIVATE HEALTH INSURANCE | Source: Ambulatory Visit | Attending: Internal Medicine | Admitting: Internal Medicine

## 2011-07-10 ENCOUNTER — Encounter (HOSPITAL_COMMUNITY): Payer: Self-pay

## 2011-07-10 VITALS — BP 162/84 | HR 82 | Wt 170.2 lb

## 2011-07-10 DIAGNOSIS — I10 Essential (primary) hypertension: Secondary | ICD-10-CM | POA: Insufficient documentation

## 2011-07-10 DIAGNOSIS — I080 Rheumatic disorders of both mitral and aortic valves: Secondary | ICD-10-CM | POA: Insufficient documentation

## 2011-07-10 DIAGNOSIS — C50919 Malignant neoplasm of unspecified site of unspecified female breast: Secondary | ICD-10-CM

## 2011-07-10 DIAGNOSIS — I379 Nonrheumatic pulmonary valve disorder, unspecified: Secondary | ICD-10-CM | POA: Insufficient documentation

## 2011-07-10 DIAGNOSIS — I059 Rheumatic mitral valve disease, unspecified: Secondary | ICD-10-CM

## 2011-07-10 HISTORY — DX: Essential (primary) hypertension: I10

## 2011-07-10 HISTORY — DX: Depression, unspecified: F32.A

## 2011-07-10 HISTORY — DX: Major depressive disorder, single episode, unspecified: F32.9

## 2011-07-10 MED ORDER — LOSARTAN POTASSIUM 50 MG PO TABS: 50.0000 mg | ORAL_TABLET | Freq: Every day | ORAL | Status: DC

## 2011-07-10 NOTE — Assessment & Plan Note (Addendum)
Have discussed the role of the Heart Failure clinic and answered all questions.  Her last echo shows preserved EF and lateral S' velocity is good will repeat echo today as it has been 3 months.  Her next herceptin therapy (q3 weeks) is on 07/24/11 and will call her with results of echo.  At this time feel that she will do well on herceptin therapy as she tolerated it in the past.  At this time it is important to keep her BP under tighter control.  Will add losartan 50 mg daily.  She is to call if she experiences any side effects.  Will review echo today and schedule repeat in 3 months.      Patient seen and examined with Ulyess Blossom PA-C. We discussed all aspects of the encounter. I agree with the assessment and plan as stated above.  Doing well. No evidence of Herceptin cardiotoxicity. Will repeat echo today. Start Losartan for HTN (unable to tolerate ACE-I due to cough). Continue Herceptin.

## 2011-07-10 NOTE — Assessment & Plan Note (Signed)
As above, add losartan.

## 2011-07-10 NOTE — Progress Notes (Signed)
Referring Physician: [name] Primary Care: [name]  Primary Cardiologist: none  HPI: Mrs. Dowson is a 61 y.o. female with history of stage IV breast cancer initially diagnosed in 2007.  She underwent 4 cycles of adjuvant paclitaxel/carboplatin/trastuzumab (completed 04/2006) and trastuzumab continued for 1 year.  She had postmastectomy radiation completed 07/2006.  Anastrozole began in 07/2006.  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  Started on tamoxifen, lapatinib, and trastuzumab between June and October of 2012, with progression.  She is now on ixabepilone, lapatinib, and trastuzumab beginning November of 2012, with fair tolerance.  She has completed four cycles of ixabepilone/lapatinib and will remain on herceptin indefinitely.    Echos: 11/2010: EF 60-65% with poor windows to evaluate lateral S' 03/2011: EF60-65% with lateral S' peaks of 11.7, 12.18  She has been referred by [name] for co-management while she is on herceptin therapy.  She is currently doing fairly well with therapy.  She has increased fatigue after therapy.  She denies SOB/orthopnea/PND or edema.  She denies CP/syncope.  She was scheduled for echo on 1/16 but was confused and thought the echo was today (we have scheduled one for this afternoon).  Her BP is high today and she says she has been on an antihypertensive in the past but it made her cough so it was discontinued.     Review of Systems: [y] = yes, [ ]  = no   General: Weight gain [ ] ; Weight loss [ ] ; Anorexia [ ] ; Fatigue [ y]; Fever [ ] ; Chills [ ] ; Weakness [ ]   Cardiac: Chest pain/pressure [ ] ; Resting SOB [ ] ; Exertional SOB [ ] ; Orthopnea [ ] ;  Pedal Edema [ ] ; Palpitations [ ] ; Syncope [ ] ; Presyncope [ ] ; Paroxysmal nocturnal dyspnea[ ]   Pulmonary: Cough [ ] ; Wheezing[ ] ; Hemoptysis[ ] ; Sputum [ ] ; Snoring [ ]   GI: Vomiting[ ] ; Dysphagia[ ] ; Melena[ ] ; Hematochezia [ ] ; Heartburn[ ] ; Abdominal pain [ ] ; Constipation [ ] ; Diarrhea [ ] ; BRBPR [ ]   GU: Hematuria[ ] ; Dysuria [ ] ; Nocturia[ ]   Vascular: Pain in legs with walking [ ] ; Pain in feet with lying flat [ ] ; Non-healing sores [ ] ; Stroke [ ] ; TIA [ ] ; Slurred speech [ ] ;  Neuro: Headaches[ ] ; Vertigo[ ] ; Seizures[ ] ; Paresthesias[ ] ;Blurred vision [ ] ; Diplopia [ ] ; Vision changes [ ]   Ortho/Skin: Arthritis [ ] ; Joint pain [ ] ; Muscle pain [ ] ; Joint swelling [ ] ; Back Pain [ ] ; Rash [ ]   Psych: Depression[y ]; Anxiety[ ]   Heme: Bleeding problems [ ] ; Clotting disorders [ ] ; Anemia [ ]   Endocrine: Diabetes [ ] ; Thyroid dysfunction[ ]    Past Medical History  Diagnosis Date  . breast ca dx'd 01/15/2006    chemo/xrt comp  . Hypertension   . Depression     Current Outpatient Prescriptions  Medication Sig Dispense Refill  . calcium citrate (CALCITRATE - DOSED IN MG ELEMENTAL CALCIUM) 950 MG tablet Take 1 tablet by mouth daily.        . cholecalciferol (VITAMIN D) 1000 UNITS tablet Take 3,000 Units by mouth daily.        . diphenhydrAMINE (BENADRYL) 25 MG tablet Take 25 mg  by mouth every 6 (six) hours as needed.      . hydrocodone-acetaminophen (LORCET-HD) 5-500 MG per capsule Take 1 capsule by mouth every 6 (six) hours as needed.        . lapatinib (TYKERB) 250 MG tablet Take 1,500 mg by mouth daily.        Marland Kitchen loperamide (IMODIUM) 2 MG capsule Take 2 mg by mouth 4 (four) times daily as needed.        Marland Kitchen LORazepam (ATIVAN) 0.5 MG tablet Take 0.5 mg by mouth 2 (two) times daily as needed.        . potassium chloride (KLOR-CON) 20 MEQ packet Take 20 mEq by mouth 2 (two) times daily.        . prochlorperazine (COMPAZINE) 10 MG tablet Take 10 mg by mouth every 6 (six) hours as needed.         . venlafaxine (EFFEXOR-XR) 75 MG 24 hr capsule TAKE ONE CAPSULE BY MOUTH EVERY DAY  30 capsule  6  . losartan (COZAAR) 50 MG tablet Take 1 tablet (50 mg total) by mouth daily.  30 tablet  6    No Known Allergies  History   Social History  . Marital Status: Married    Spouse Name: N/A    Number of Children: 2  . Years of Education: N/A   Occupational History  . Not on file.   Social History Main Topics  . Smoking status: Former Games developer  . Smokeless tobacco: Former Neurosurgeon    Quit date: 06/17/1992  . Alcohol Use:   . Drug Use: No  . Sexually Active:    Other Topics Concern  . Not on file   Social History Narrative  . No narrative on file   She is retired.  No family history on file. No coronary or vascular disease.    PHYSICAL EXAM: Filed Vitals:   07/10/11 0905  BP: 162/84  Pulse: 82  Weight: 170 lb 4 oz (77.225 kg)  SpO2: 98%    General:  Well appearing. No respiratory difficulty HEENT: normal Neck: supple. no JVD. Carotids 2+ bilat; no bruits. No lymphadenopathy or thryomegaly appreciated. Cor: PMI nondisplaced. Regular rate & rhythm. No rubs, gallops or murmurs. Lungs: clear Abdomen: soft, nontender, nondistended. No hepatosplenomegaly. No bruits or masses. Good bowel sounds. Extremities: no cyanosis, clubbing, rash, edema Neuro: alert & oriented x 3, cranial nerves grossly intact. moves all 4 extremities w/o difficulty. Affect pleasant.   ASSESSMENT & PLAN:

## 2011-07-10 NOTE — Patient Instructions (Signed)
Add losartan 50 mg daily to your medications.  Your physician has requested that you have an echocardiogram. Echocardiography is a painless test that uses sound waves to create images of your heart. It provides your doctor with information about the size and shape of your heart and how well your heart's chambers and valves are working. This procedure takes approximately one hour. There are no restrictions for this procedure.  Will have echo today.  Follow up 3 months with echo.

## 2011-07-12 ENCOUNTER — Other Ambulatory Visit: Payer: PRIVATE HEALTH INSURANCE | Admitting: Lab

## 2011-07-16 ENCOUNTER — Other Ambulatory Visit: Payer: Self-pay | Admitting: *Deleted

## 2011-07-22 ENCOUNTER — Ambulatory Visit (HOSPITAL_COMMUNITY)
Admission: RE | Admit: 2011-07-22 | Discharge: 2011-07-22 | Disposition: A | Discharge status: Home / Self Care | Payer: PRIVATE HEALTH INSURANCE | Source: Ambulatory Visit | Attending: Oncology | Admitting: Oncology

## 2011-07-22 ENCOUNTER — Other Ambulatory Visit: Payer: PRIVATE HEALTH INSURANCE

## 2011-07-22 ENCOUNTER — Encounter (HOSPITAL_COMMUNITY): Payer: Self-pay

## 2011-07-22 DIAGNOSIS — M51379 Other intervertebral disc degeneration, lumbosacral region without mention of lumbar back pain or lower extremity pain: Secondary | ICD-10-CM | POA: Insufficient documentation

## 2011-07-22 DIAGNOSIS — K7689 Other specified diseases of liver: Secondary | ICD-10-CM | POA: Insufficient documentation

## 2011-07-22 DIAGNOSIS — M5137 Other intervertebral disc degeneration, lumbosacral region: Secondary | ICD-10-CM | POA: Insufficient documentation

## 2011-07-22 DIAGNOSIS — R599 Enlarged lymph nodes, unspecified: Secondary | ICD-10-CM | POA: Insufficient documentation

## 2011-07-22 DIAGNOSIS — C50919 Malignant neoplasm of unspecified site of unspecified female breast: Secondary | ICD-10-CM | POA: Insufficient documentation

## 2011-07-22 DIAGNOSIS — C7970 Secondary malignant neoplasm of unspecified adrenal gland: Secondary | ICD-10-CM | POA: Insufficient documentation

## 2011-07-22 LAB — CBC WITH DIFFERENTIAL/PLATELET
BASO%: 0.9 % (ref 0.0–2.0)
Eosinophils Absolute: 0.1 10*3/uL (ref 0.0–0.5)
MCHC: 33.9 g/dL (ref 31.5–36.0)
MCV: 88.6 fL (ref 79.5–101.0)
MONO#: 0.5 10*3/uL (ref 0.1–0.9)
MONO%: 6.3 % (ref 0.0–14.0)
NEUT#: 5.4 10*3/uL (ref 1.5–6.5)
RBC: 3.67 10*6/uL — ABNORMAL LOW (ref 3.70–5.45)
RDW: 16.6 % — ABNORMAL HIGH (ref 11.2–14.5)
WBC: 7.6 10*3/uL (ref 3.9–10.3)

## 2011-07-22 LAB — COMPREHENSIVE METABOLIC PANEL
ALT: 23 U/L (ref 0–35)
Albumin: 4.3 g/dL (ref 3.5–5.2)
Alkaline Phosphatase: 95 U/L (ref 39–117)
Glucose, Bld: 139 mg/dL — ABNORMAL HIGH (ref 70–99)
Potassium: 4.2 mEq/L (ref 3.5–5.3)
Sodium: 135 mEq/L (ref 135–145)
Total Protein: 6.1 g/dL (ref 6.0–8.3)

## 2011-07-22 MED ORDER — IOHEXOL 300 MG/ML  SOLN
100.0000 mL | Freq: Once | INTRAMUSCULAR | Status: AC | PRN
  Administered 2011-07-22: 100 mL via INTRAVENOUS

## 2011-07-23 LAB — CELL SEARCH FOR BREAST CANCER

## 2011-07-24 ENCOUNTER — Ambulatory Visit (HOSPITAL_BASED_OUTPATIENT_CLINIC_OR_DEPARTMENT_OTHER): Payer: PRIVATE HEALTH INSURANCE | Admitting: Oncology

## 2011-07-24 VITALS — BP 150/85 | HR 89 | Temp 98.1°F | Ht 65.0 in | Wt 171.0 lb

## 2011-07-24 DIAGNOSIS — C50919 Malignant neoplasm of unspecified site of unspecified female breast: Secondary | ICD-10-CM

## 2011-07-24 NOTE — Progress Notes (Signed)
ID: Rebecca Pugh  DOB: [birth year]  MR#: [id number]  CSN#: [id number]   Interval History:   Rebecca Pugh returns today with her husband Gene for followup of her breast cancer. She started ixabepilone in November. She has tolerated it moderately well. There have been problems with fatigue, hair loss, and especially peripheral neuropathy. Her fingers and toes burn addressed and they aren't with activity. She has dropped a few things, I can't on screw capsule, and so on. This is really beginning to affect her activities of daily living and we are therefore stopping the ixabepilone  ROS:  A detailed review of systems was otherwise noncontributory. She has mild urinary leakage issues, which are not new. There have been no symptoms of congestive heart failure and she had her repeat echo late January showing a well preserved ejection fraction. She has no significant skin or bowel movement changes from the lapatinib, which she now takes at a lower dose (4 tablets daily). Otherwise there have been no fevers, rash, bleeding, significant weight loss, unexplained fatigue, unusual headaches, visual changes, cough, phlegm production, or pain.  No Known Allergies  Current Outpatient Prescriptions  Medication Sig Dispense Refill  . calcium citrate (CALCITRATE - DOSED IN MG ELEMENTAL CALCIUM) 950 MG tablet Take 1 tablet by mouth daily.        . cholecalciferol (VITAMIN D) 1000 UNITS tablet Take 3,000 Units by mouth daily.        . diphenhydrAMINE (BENADRYL) 25 MG tablet Take 25 mg by mouth every 6 (six) hours as needed.      . lapatinib (TYKERB) 250 MG tablet Take 1,500 mg by mouth daily.        Marland Kitchen loperamide (IMODIUM) 2 MG capsule Take 2 mg by mouth 4 (four) times daily as needed.        Marland Kitchen LORazepam (ATIVAN) 0.5 MG tablet Take 0.5 mg by mouth 2 (two) times daily as needed.        Marland Kitchen losartan (COZAAR) 50 MG tablet Take 1 tablet (50 mg total) by mouth daily.  30 tablet  6  . potassium chloride (KLOR-CON) 20 MEQ packet Take 20  mEq by mouth 2 (two) times daily.        . prochlorperazine (COMPAZINE) 10 MG tablet Take 10 mg by mouth every 6 (six) hours as needed.        . venlafaxine (EFFEXOR-XR) 75 MG 24 hr capsule TAKE ONE CAPSULE BY MOUTH EVERY DAY  30 capsule  6  . hydrocodone-acetaminophen (LORCET-HD) 5-500 MG per capsule Take 1 capsule by mouth every 6 (six) hours as needed.         PAST MEDICAL HISTORY:  Significant for remote tonsillectomy, remote cholecystectomy, status post appendectomy, multiple skin biopsies for what the patient said were simple keratoses, and status post port placement.    FAMILY HISTORY: The patient has no information regarding her father.  She was brought up by her stepfather.  The patient's mother died at the age of 39 with bladder cancer.  The patient has one sister who is in good health.  There is no history of breast or ovarian cancer in the family to the patient's knowledge.    GYNECOLOGIC HISTORY:  She is GX, P2.  The patient is having regular periods although the last one was on 12-10-05, so she missed a period in July, possibly because of all the current stress.  She does get occasional hot flashes, so she may be entering the perimenopausal period.    SOCIAL HISTORY:  She and her husband, Gene, have been married 10 years.  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  She has two children, a daughter, [name], who lives in [place] and is a Investment banker, corporate, and a son,  [name], also in [place], who works for [employer].  She has two grandchildren and three step-grandchildren from her own two children, [name] and [name], who are from her first marriage.  Gene has one daughter from his first marriage and he has grandchildren through her. The patient was brought up as a Catholic but currently is not practicing.  She and Gene are attending a [employer].    Objective: Middle-aged white woman who  appears comfortable  Filed Vitals:   07/24/11 0933  BP: 150/85  Pulse: 89  Temp: 98.1 F (36.7 C)    BMI: Body mass index is 28.46 kg/(m^2).   ECOG FS: 1  Physical Exam:   Sclerae unicteric  Oropharynx clear  No peripheral adenopathy  Lungs clear -- no rales or rhonchi  Heart regular rate and rhythm  Abdomen benign  MSK no focal spinal tenderness, no peripheral edema  Neuro nonfocal  Breast exam: Right breast no suspicious findings. Left breast status post mastectomy no evidence of local recurrence.  Lab Results: The CA 27-29 has dropped from 61 to 47. A CellSearch shows 0 circulating tumor cells.     Chemistry      Component Value Date/Time   NA 135 07/22/2011 0911   NA 142 11/05/2010 1036   K 4.2 07/22/2011 0911   K 4.5 11/05/2010 1036   CL 102 07/22/2011 0911   CL 99 11/05/2010 1036   CO2 24 07/22/2011 0911   CO2 26 11/05/2010 1036   BUN 11 07/22/2011 0911   BUN 14 11/05/2010 1036   CREATININE 0.85 07/22/2011 0911   CREATININE 0.8 11/05/2010 1036      Component Value Date/Time   CALCIUM 9.3 07/22/2011 0911   CALCIUM 8.8 11/05/2010 1036   ALKPHOS 95 07/22/2011 0911   ALKPHOS 106* 11/05/2010 1036   AST 18 07/22/2011 0911   AST 27 11/05/2010 1036   ALT 23 07/22/2011 0911   BILITOT 0.8 07/22/2011 0911   BILITOT 0.80 11/05/2010 1036       Lab Results  Component Value Date   WBC 7.6 07/22/2011   HGB 11.0* 07/22/2011   HCT 32.5* 07/22/2011   MCV 88.6 07/22/2011   PLT 267 07/22/2011   NEUTROABS 5.4 07/22/2011    Studies/Results:  07/22/2011  *RADIOLOGY REPORT*  Clinical Data:  Metastatic left breast cancer.  Left mastectomy. Ongoing chemotherapy.  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  CT CHEST, ABDOMEN AND PELVIS WITH CONTRAST  Technique:  Multidetector CT imaging of the chest, abdomen and pelvis was performed following the standard protocol during bolus administration of intravenous contrast.  Contrast: OMNIPAQUE IOHEXOL 300 MG/ML IV SOLN  Comparison:  Multiple exams, including 11/05/2010   CT CHEST  Findings:  No pathologic adenopathy in the chest.  Radiation therapy related scarring anteriorly in the left upper lobe noted.  Port-A-Cath tip is in the SVC.  The faint interstitial accentuation of the right upper lobe is reduced compared the prior exam.  No definite osseous metastatic lesions in the chest noted.  IMPRESSION:  1.  No findings of metastatic disease in the chest.  CT ABDOMEN AND PELVIS  Findings:  Left adrenal mass was previously 4.9  x 3.9 cm and currently measures 2.3 x 2.7 cm.  Left periaortic adenopathy noted with a node just below the left renal vein measuring 2.6 x 1.7 cm (formerly 2.3 x 1.7 cm).  A lower left periaortic lymph node has a short axis diameter of 1 cm on image 71 (formerly 0.7 cm).  Diffuse hepatic steatosis noted with a 0.9 cm enhancing lesion in the lateral segment left hepatic lobe on image 48 of series 2.  The lesion in the dome of the right hepatic lobe is less conspicuous and only faintly appreciated on image 49 of series 2.  The lesion inferiorly in the right hepatic lobe measures 2.8 x 1.8 cm on image 68 of series 2, essentially stable from 11/05/2010; nine of these lesions were demonstrably hypermetabolic on the most recent PET CT.  The kidneys, spleen, and pancreas appear normal.  No pathologic pelvic adenopathy noted. The uterus and adnexa appear unremarkable.  This protrusion and calcification noted at L4-5 with mild posterior osseous ridging and degenerative loss of intervertebral disc height at L5-S1.  The kidneys appear unremarkable, as do the proximal ureters.  IMPRESSION:  1.  Mixed appearance, with significantly reduced size of the left adrenal mass but with mild increase in prominence of the adjacent left periaortic adenopathy. 2.  Stable hepatic steatosis with enhancing liver lesions probably representing hemangiomas. 3.  Lower lumbar degenerative disc disease.  Original Report Authenticated By: Dellia Cloud, M.D.   Assessment:  61 year old  [facility] woman with stage IV breast cancer.   1. 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 2. Status post 4 cycles of adjuvant paclitaxel, carboplatin and trastuzumab completed November 2007. 3. Postmastectomy radiation completed February 2008. 4. Anastrozole begun February 2008 (the trastuzumab also was continued to complete a year), continued until June 2012.   5. 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  6. On tamoxifen/lapatinib/trastuzumab between June 2012 and October 2012.  7. Ixempra/lapatinib/trastuzumab started October 2012   Plan: I believe the ixabepilone is working well, and it has reduced the size of the left adrenal gland by approximately 50%. The periaortic adenopathy as noted above is essentially stable. However we really can't continue this drug because of the peripheral neuropathy issue.  Accordingly we are going to start fulvestrant. She will receive a first dose on October 13. We are going to continue the trastuzumab, which we will give on a every 4 week basis, and also the lapatinib which she is taking at about 1 g (4 tablets) daily. The goal is for continuing disease control, though of course we will be happy there is further shrinkage of the measurable disease.  We will follow her CA 27-29 on a monthly basis and we will repeat a CellSearch and CT scans in 3-4 months. Note that she will be out of 10 between April 8 and April 17, so she will have a Faslodex dose slightly earlier than normally scheduled on April 5.   Shelda Truby C 07/24/2011

## 2011-07-29 ENCOUNTER — Other Ambulatory Visit: Payer: Self-pay | Admitting: Oncology

## 2011-07-31 ENCOUNTER — Other Ambulatory Visit (HOSPITAL_BASED_OUTPATIENT_CLINIC_OR_DEPARTMENT_OTHER): Payer: PRIVATE HEALTH INSURANCE | Admitting: Lab

## 2011-07-31 ENCOUNTER — Ambulatory Visit: Payer: PRIVATE HEALTH INSURANCE

## 2011-07-31 ENCOUNTER — Other Ambulatory Visit: Payer: PRIVATE HEALTH INSURANCE

## 2011-07-31 ENCOUNTER — Ambulatory Visit (HOSPITAL_BASED_OUTPATIENT_CLINIC_OR_DEPARTMENT_OTHER): Payer: PRIVATE HEALTH INSURANCE

## 2011-07-31 VITALS — BP 117/72 | HR 81 | Temp 97.4°F

## 2011-07-31 DIAGNOSIS — C50419 Malignant neoplasm of upper-outer quadrant of unspecified female breast: Secondary | ICD-10-CM

## 2011-07-31 DIAGNOSIS — Z5111 Encounter for antineoplastic chemotherapy: Secondary | ICD-10-CM

## 2011-07-31 DIAGNOSIS — C50919 Malignant neoplasm of unspecified site of unspecified female breast: Secondary | ICD-10-CM

## 2011-07-31 LAB — CBC WITH DIFFERENTIAL/PLATELET
BASO%: 0.8 % (ref 0.0–2.0)
EOS%: 6.6 % (ref 0.0–7.0)
LYMPH%: 21.2 % (ref 14.0–49.7)
MCH: 29.2 pg (ref 25.1–34.0)
MCHC: 32.8 g/dL (ref 31.5–36.0)
MCV: 89.2 fL (ref 79.5–101.0)
MONO#: 0.5 10*3/uL (ref 0.1–0.9)
MONO%: 6.6 % (ref 0.0–14.0)
NEUT%: 64.8 % (ref 38.4–76.8)
Platelets: 245 10*3/uL (ref 145–400)
RBC: 3.97 10*6/uL (ref 3.70–5.45)
WBC: 7.8 10*3/uL (ref 3.9–10.3)
nRBC: 0 % (ref 0–0)

## 2011-07-31 MED ORDER — HEPARIN SOD (PORK) LOCK FLUSH 100 UNIT/ML IV SOLN
500.0000 [IU] | Freq: Once | INTRAVENOUS | Status: AC | PRN
  Administered 2011-07-31: 500 [IU]
  Filled 2011-07-31: qty 5

## 2011-07-31 MED ORDER — SODIUM CHLORIDE 0.9 % IJ SOLN
10.0000 mL | INTRAMUSCULAR | Status: DC | PRN
  Administered 2011-07-31: 10 mL
  Filled 2011-07-31: qty 10

## 2011-07-31 MED ORDER — ACETAMINOPHEN 325 MG PO TABS
650.0000 mg | ORAL_TABLET | Freq: Once | ORAL | Status: AC
  Administered 2011-07-31: 650 mg via ORAL

## 2011-07-31 MED ORDER — TRASTUZUMAB CHEMO INJECTION 440 MG
8.0000 mg/kg | Freq: Once | INTRAVENOUS | Status: AC
  Administered 2011-07-31: 672 mg via INTRAVENOUS
  Filled 2011-07-31: qty 32

## 2011-07-31 MED ORDER — FULVESTRANT 250 MG/5ML IM SOLN
500.0000 mg | INTRAMUSCULAR | Status: DC
  Administered 2011-07-31: 500 mg via INTRAMUSCULAR
  Filled 2011-07-31: qty 10

## 2011-08-06 ENCOUNTER — Other Ambulatory Visit: Payer: Self-pay | Admitting: Certified Registered Nurse Anesthetist

## 2011-08-08 ENCOUNTER — Other Ambulatory Visit: Payer: Self-pay | Admitting: Oncology

## 2011-08-13 ENCOUNTER — Other Ambulatory Visit: Payer: Self-pay | Admitting: Oncology

## 2011-08-13 ENCOUNTER — Encounter: Payer: Self-pay | Admitting: Oncology

## 2011-08-14 ENCOUNTER — Other Ambulatory Visit (HOSPITAL_BASED_OUTPATIENT_CLINIC_OR_DEPARTMENT_OTHER): Payer: PRIVATE HEALTH INSURANCE | Admitting: Lab

## 2011-08-14 ENCOUNTER — Ambulatory Visit (HOSPITAL_BASED_OUTPATIENT_CLINIC_OR_DEPARTMENT_OTHER): Payer: PRIVATE HEALTH INSURANCE | Admitting: Physician Assistant

## 2011-08-14 ENCOUNTER — Other Ambulatory Visit: Payer: Self-pay | Admitting: Physician Assistant

## 2011-08-14 ENCOUNTER — Telehealth: Payer: Self-pay | Admitting: *Deleted

## 2011-08-14 ENCOUNTER — Encounter: Payer: Self-pay | Admitting: Physician Assistant

## 2011-08-14 VITALS — BP 131/77 | HR 85 | Temp 98.5°F | Ht 65.0 in | Wt 171.8 lb

## 2011-08-14 DIAGNOSIS — R030 Elevated blood-pressure reading, without diagnosis of hypertension: Secondary | ICD-10-CM

## 2011-08-14 DIAGNOSIS — F419 Anxiety disorder, unspecified: Secondary | ICD-10-CM

## 2011-08-14 DIAGNOSIS — Z5111 Encounter for antineoplastic chemotherapy: Secondary | ICD-10-CM

## 2011-08-14 DIAGNOSIS — C50919 Malignant neoplasm of unspecified site of unspecified female breast: Secondary | ICD-10-CM

## 2011-08-14 DIAGNOSIS — Z170 Estrogen receptor positive status [ER+]: Secondary | ICD-10-CM

## 2011-08-14 LAB — CBC WITH DIFFERENTIAL/PLATELET
BASO%: 0.6 % (ref 0.0–2.0)
EOS%: 6.8 % (ref 0.0–7.0)
MCH: 30.1 pg (ref 25.1–34.0)
MCHC: 33.4 g/dL (ref 31.5–36.0)
MCV: 90 fL (ref 79.5–101.0)
MONO%: 5.7 % (ref 0.0–14.0)
RDW: 15.6 % — ABNORMAL HIGH (ref 11.2–14.5)
lymph#: 1.3 10*3/uL (ref 0.9–3.3)

## 2011-08-14 MED ORDER — LORAZEPAM 0.5 MG PO TABS: 0.5000 mg | ORAL_TABLET | Freq: Two times a day (BID) | ORAL | Status: DC | PRN

## 2011-08-14 MED ORDER — FULVESTRANT 250 MG/5ML IM SOLN
500.0000 mg | INTRAMUSCULAR | Status: DC
  Administered 2011-08-14: 500 mg via INTRAMUSCULAR
  Filled 2011-08-14: qty 10

## 2011-08-14 NOTE — Progress Notes (Signed)
ID: Rebecca Pugh  DOB: [birth year]  MR#: [id number]  CSN#: [id number]   Interval History:   Rebecca Pugh returns today for followup of her metastatic breast carcinoma. She's currently receiving fulvestrant injections, received her first injection 2 weeks ago on February 13, and is due for her next loading dose today. She's been receiving trastuzumab, currently given every 4 weeks to coordinate with her injections. She continues on lapatinib at 1 g (4 tablets) daily.  Tamicka is tolerating this combination well. She tells me she had no problems or complaints at all following her first injection of fulvestrant 2 weeks ago. She's had no hot flashes. No increased vaginal dryness. No increased joint pain. She does have some intermittent diarrhea associated with the lapatinib, but finds that one dose of Imodium takes care of the problem. This does not happen every day. She's had no blood in the stool. She denies any problems with nausea or emesis. No increased shortness of breath. No chest pain, pressure, or palpitations.  A detailed review of systems is otherwise noncontributory as noted below.  Review of Systems: Constitutional:  no weight loss, fever, night sweats and feels well Eyes: negative NWG:[id number] Cardiovascular: no chest pain or dyspnea on exertion Respiratory: no cough, shortness of breath, or wheezing Neurological: no TIA or stroke symptoms Dermatological: negative Gastrointestinal: no abdominal pain, nausea, change in bowel habits, or black or bloody stools Genito-Urinary: no dysuria, trouble voiding, or hematuria Hematological and Lymphatic: negative Breast: negative Musculoskeletal: negative Remaining ROS negative.    No Known Allergies  Current Outpatient Prescriptions  Medication Sig Dispense Refill  . calcium citrate (CALCITRATE - DOSED IN MG ELEMENTAL CALCIUM) 950 MG tablet Take 1 tablet by mouth daily.        . cholecalciferol (VITAMIN D) 1000 UNITS tablet Take 3,000 Units by mouth  daily.        . diphenhydrAMINE (BENADRYL) 25 MG tablet Take 25 mg by mouth every 6 (six) hours as needed.      . hydrocodone-acetaminophen (LORCET-HD) 5-500 MG per capsule Take 1 capsule by mouth every 6 (six) hours as needed.        . lapatinib (TYKERB) 250 MG tablet Take 1,000 mg by mouth daily.       Marland Kitchen loperamide (IMODIUM) 2 MG capsule Take 2 mg by mouth 4 (four) times daily as needed.        . loratadine (CLARITIN) 10 MG tablet Take 10 mg by mouth daily.      Marland Kitchen LORazepam (ATIVAN) 0.5 MG tablet Take 1 tablet (0.5 mg total) by mouth 2 (two) times daily as needed.  30 tablet  0  . losartan (COZAAR) 50 MG tablet Take 1 tablet (50 mg total) by mouth daily.  30 tablet  6  . potassium chloride (KLOR-CON) 20 MEQ packet Take 20 mEq by mouth 2 (two) times daily.        . prochlorperazine (COMPAZINE) 10 MG tablet Take 10 mg by mouth every 6 (six) hours as needed.        . venlafaxine (EFFEXOR-XR) 75 MG 24 hr capsule TAKE ONE CAPSULE BY MOUTH EVERY DAY  30 capsule  6   Current Facility-Administered Medications  Medication Dose Route Frequency Provider Last Rate Last Dose  . fulvestrant (FASLODEX) injection 500 mg  500 mg Intramuscular Q14 Days Lowella Dell, MD       PAST MEDICAL HISTORY:  Significant for remote tonsillectomy, remote cholecystectomy, status post appendectomy, multiple skin biopsies for what the patient said were simple  keratoses, and status post port placement.    FAMILY HISTORY: The patient has no information regarding her father.  She was brought up by her stepfather.  The patient's mother died at the age of 9 with bladder cancer.  The patient has one sister who is in good health.  There is no history of breast or ovarian cancer in the family to the patient's knowledge.    GYNECOLOGIC HISTORY:  She is GX, P2.  The patient is having regular periods although the last one was on 12-10-05, so she missed a period in July, possibly because of all the current stress.  She does get  occasional hot flashes, so she may be entering the perimenopausal period.    SOCIAL HISTORY:  She and her husband, Gene, have been married 10 years.  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  She has two children, a daughter, [name], who lives in [place] and is a Investment banker, corporate, and a son,  [name], also in [place], who works for [employer].  She has two grandchildren and three step-grandchildren from her own two children, [name] and [name], who are from her first marriage.  Gene has one daughter from his first marriage and he has grandchildren through her. The patient was brought up as a Catholic but currently is not practicing.  She and Gene are attending a [employer].    Objective: Middle-aged white woman who appears comfortable  Filed Vitals:   08/14/11 1115  BP: 131/77  Pulse: 85  Temp: 98.5 F (36.9 C)    BMI: Body mass index is 28.59 kg/(m^2).   ECOG FS: 1  HEENT:  Sclerae anicteric, conjunctivae pink.  Oropharynx clear.  Nodes:  No cervical, supraclavicular, or axillary lymphadenopathy palpated.  Breast Exam:  Right breast is unremarkable, with no suspicious nodularities, skin changes, or nipple inversion. Patient is status post left mastectomy, no nodularity or skin changes. No evidence of local recurrence.  Lungs:  Clear to auscultation bilaterally.  No crackles, rhonchi, or wheezes.   Heart:  Regular rate and rhythm.   Abdomen:  Soft, nontender.  Positive bowel sounds.  No organomegaly or masses palpated.   Musculoskeletal:  No focal spinal tenderness to palpation.  Extremities:  Benign.  No peripheral edema or cyanosis.   Skin:  Benign.   Neuro:  Nonfocal.   Lab Results: The CA 27-29 dropped from 61 in November 2012 to 47 on 07/22/2011. Today's results are pending.  A CellSearch in late January shows 0 circulating tumor cells.     Chemistry      Component Value Date/Time     NA 135 07/22/2011 0911   NA 142 11/05/2010 1036   K 4.2 07/22/2011 0911   K 4.5 11/05/2010 1036   CL 102 07/22/2011 0911   CL 99 11/05/2010 1036   CO2 24 07/22/2011 0911   CO2 26 11/05/2010 1036   BUN 11 07/22/2011 0911   BUN 14 11/05/2010 1036   CREATININE 0.85 07/22/2011 0911   CREATININE 0.8 11/05/2010 1036      Component Value Date/Time   CALCIUM 9.3 07/22/2011 0911   CALCIUM 8.8 11/05/2010 1036   ALKPHOS 95 07/22/2011 0911   ALKPHOS 106* 11/05/2010 1036   AST 18 07/22/2011 0911   AST 27 11/05/2010 1036   ALT 23 07/22/2011 0911   BILITOT 0.8 07/22/2011 0911   BILITOT 0.80 11/05/2010 1036  Lab Results  Component Value Date   WBC 5.3 08/14/2011   HGB 11.3* 08/14/2011   HCT 33.9* 08/14/2011   MCV 90.0 08/14/2011   PLT 283 08/14/2011   NEUTROABS 3.3 08/14/2011    Studies/Results:  Most recent echocardiogram on 07/10/2011 showed an EF of 55-60%.   Assessment:  61 year old [facility] woman with stage IV breast cancer.   1. 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 2. Status post 4 cycles of adjuvant paclitaxel, carboplatin and trastuzumab completed November 2007. 3. Postmastectomy radiation completed February 2008. 4. Anastrozole begun February 2008 (the trastuzumab also was continued to complete a year), continued until June 2012.   5. 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  6. On tamoxifen/lapatinib/trastuzumab between June 2012 and October 2012.  7. Ixempra/lapatinib/trastuzumab started October 2012, the Ixempra discontinued in February 2013 due to peripheral neuropathy. 8. Continuing on lapatinib/trastuzumab with the addition of fulvestrant, first injections given on 07/31/2011. Lapatinib given at a dose of 1 g (4 tablets) daily, and trastuzumab given every 4 weeks to coordinate with injection  appointments.   Plan:   Little receive her next injection of fulvestrant today. She'll return in 2 weeks, March 13, for both fulvestrant and trastuzumab. She is planning a trip in early April, and we'll, little early on April 5, again for her fulvestrant and trastuzumab. She'll then see [name] on May 1 to resume her q. 28 day schedule. Prior to that appointment, she'll also have her repeat echocardiogram.  We will recheck Lita's CellSearch in early May, and when she sees [name] they will discuss the possibility of repeating her CT scans at that time. In the meanwhile we will continue to follow her CA 27. 29 on a monthly basis.  Patient voices understanding and agreement with our plan, and will call with any changes or problems prior to her next scheduled appointment.  Lind Ausley 08/14/2011

## 2011-08-14 NOTE — Telephone Encounter (Signed)
gave patient appointment for [phone] and 10-2011 printed out calendar and gave to the patient

## 2011-08-15 LAB — COMPREHENSIVE METABOLIC PANEL
AST: 21 U/L (ref 0–37)
Albumin: 4.4 g/dL (ref 3.5–5.2)
Alkaline Phosphatase: 100 U/L (ref 39–117)
BUN: 16 mg/dL (ref 6–23)
Glucose, Bld: 84 mg/dL (ref 70–99)
Potassium: 4.4 mEq/L (ref 3.5–5.3)
Sodium: 142 mEq/L (ref 135–145)
Total Bilirubin: 0.6 mg/dL (ref 0.3–1.2)
Total Protein: 6.4 g/dL (ref 6.0–8.3)

## 2011-08-16 ENCOUNTER — Encounter: Payer: Self-pay | Admitting: Oncology

## 2011-08-16 NOTE — Progress Notes (Signed)
Received letter from [facility] written by [name]  explaining the rationale used to choose the drugs for [name]. Once reviewed per Val,  [name] asked me to forward to [name]. Mailed letter today.

## 2011-08-16 NOTE — Progress Notes (Signed)
Disregard previous note. Skip stated Lew needs to review letter as well. Rebecca Pugh is schedule to return on Monday. Will hold letter for Lew to review once approve will mail to patient.

## 2011-08-19 ENCOUNTER — Encounter: Payer: Self-pay | Admitting: Oncology

## 2011-08-19 NOTE — Progress Notes (Signed)
Lew reviewed letter written by [name] and agreed. Mailed letter to Ms. Goar today.

## 2011-08-28 ENCOUNTER — Ambulatory Visit (HOSPITAL_BASED_OUTPATIENT_CLINIC_OR_DEPARTMENT_OTHER): Payer: PRIVATE HEALTH INSURANCE

## 2011-08-28 ENCOUNTER — Other Ambulatory Visit (HOSPITAL_BASED_OUTPATIENT_CLINIC_OR_DEPARTMENT_OTHER): Payer: PRIVATE HEALTH INSURANCE | Admitting: Lab

## 2011-08-28 VITALS — BP 140/82 | HR 72 | Temp 98.4°F

## 2011-08-28 DIAGNOSIS — C50919 Malignant neoplasm of unspecified site of unspecified female breast: Secondary | ICD-10-CM

## 2011-08-28 DIAGNOSIS — C50419 Malignant neoplasm of upper-outer quadrant of unspecified female breast: Secondary | ICD-10-CM

## 2011-08-28 DIAGNOSIS — Z5112 Encounter for antineoplastic immunotherapy: Secondary | ICD-10-CM

## 2011-08-28 DIAGNOSIS — Z5111 Encounter for antineoplastic chemotherapy: Secondary | ICD-10-CM

## 2011-08-28 LAB — COMPREHENSIVE METABOLIC PANEL
BUN: 14 mg/dL (ref 6–23)
CO2: 26 mEq/L (ref 19–32)
Calcium: 9.3 mg/dL (ref 8.4–10.5)
Chloride: 102 mEq/L (ref 96–112)
Creatinine, Ser: 0.89 mg/dL (ref 0.50–1.10)
Glucose, Bld: 127 mg/dL — ABNORMAL HIGH (ref 70–99)
Total Bilirubin: 0.7 mg/dL (ref 0.3–1.2)

## 2011-08-28 LAB — CBC WITH DIFFERENTIAL/PLATELET
Eosinophils Absolute: 0.4 10*3/uL (ref 0.0–0.5)
HCT: 36.6 % (ref 34.8–46.6)
LYMPH%: 26.8 % (ref 14.0–49.7)
MCV: 88.4 fL (ref 79.5–101.0)
MONO#: 0.4 10*3/uL (ref 0.1–0.9)
MONO%: 6.5 % (ref 0.0–14.0)
NEUT#: 3.9 10*3/uL (ref 1.5–6.5)
NEUT%: 59.8 % (ref 38.4–76.8)
Platelets: 257 10*3/uL (ref 145–400)
RBC: 4.14 10*6/uL (ref 3.70–5.45)
WBC: 6.5 10*3/uL (ref 3.9–10.3)

## 2011-08-28 MED ORDER — FULVESTRANT 250 MG/5ML IM SOLN
500.0000 mg | INTRAMUSCULAR | Status: DC
  Administered 2011-08-28: 500 mg via INTRAMUSCULAR
  Filled 2011-08-28: qty 10

## 2011-08-28 MED ORDER — SODIUM CHLORIDE 0.9 % IJ SOLN
10.0000 mL | INTRAMUSCULAR | Status: DC | PRN
  Administered 2011-08-28: 10 mL
  Filled 2011-08-28: qty 10

## 2011-08-28 MED ORDER — DIPHENHYDRAMINE HCL 25 MG PO CAPS: 25.0000 mg | ORAL_CAPSULE | Freq: Once | ORAL | Status: DC

## 2011-08-28 MED ORDER — ACETAMINOPHEN 325 MG PO TABS
650.0000 mg | ORAL_TABLET | Freq: Once | ORAL | Status: AC
  Administered 2011-08-28: 650 mg via ORAL

## 2011-08-28 MED ORDER — TRASTUZUMAB CHEMO INJECTION 440 MG
8.0000 mg/kg | Freq: Once | INTRAVENOUS | Status: AC
  Administered 2011-08-28: 672 mg via INTRAVENOUS
  Filled 2011-08-28: qty 32

## 2011-08-28 MED ORDER — HEPARIN SOD (PORK) LOCK FLUSH 100 UNIT/ML IV SOLN
500.0000 [IU] | Freq: Once | INTRAVENOUS | Status: AC | PRN
  Administered 2011-08-28: 500 [IU]
  Filled 2011-08-28: qty 5

## 2011-08-28 MED ORDER — SODIUM CHLORIDE 0.9 % IV SOLN
Freq: Once | INTRAVENOUS | Status: AC
  Administered 2011-08-28: 10:00:00 via INTRAVENOUS

## 2011-09-20 ENCOUNTER — Ambulatory Visit: Payer: PRIVATE HEALTH INSURANCE

## 2011-09-20 ENCOUNTER — Other Ambulatory Visit (HOSPITAL_BASED_OUTPATIENT_CLINIC_OR_DEPARTMENT_OTHER): Payer: PRIVATE HEALTH INSURANCE | Admitting: Lab

## 2011-09-20 ENCOUNTER — Ambulatory Visit (HOSPITAL_BASED_OUTPATIENT_CLINIC_OR_DEPARTMENT_OTHER): Payer: PRIVATE HEALTH INSURANCE

## 2011-09-20 VITALS — BP 161/89 | HR 73 | Temp 98.9°F

## 2011-09-20 DIAGNOSIS — C7970 Secondary malignant neoplasm of unspecified adrenal gland: Secondary | ICD-10-CM

## 2011-09-20 DIAGNOSIS — Z5111 Encounter for antineoplastic chemotherapy: Secondary | ICD-10-CM

## 2011-09-20 DIAGNOSIS — C773 Secondary and unspecified malignant neoplasm of axilla and upper limb lymph nodes: Secondary | ICD-10-CM

## 2011-09-20 DIAGNOSIS — C50919 Malignant neoplasm of unspecified site of unspecified female breast: Secondary | ICD-10-CM

## 2011-09-20 LAB — CBC WITH DIFFERENTIAL/PLATELET
BASO%: 1 % (ref 0.0–2.0)
EOS%: 5.6 % (ref 0.0–7.0)
HCT: 36.4 % (ref 34.8–46.6)
LYMPH%: 29 % (ref 14.0–49.7)
MCH: 29.2 pg (ref 25.1–34.0)
MCHC: 33.2 g/dL (ref 31.5–36.0)
NEUT%: 58.2 % (ref 38.4–76.8)
RBC: 4.15 10*6/uL (ref 3.70–5.45)
lymph#: 1.5 10*3/uL (ref 0.9–3.3)

## 2011-09-20 LAB — COMPREHENSIVE METABOLIC PANEL
ALT: 23 U/L (ref 0–35)
AST: 20 U/L (ref 0–37)
CO2: 30 mEq/L (ref 19–32)
Calcium: 9.4 mg/dL (ref 8.4–10.5)
Chloride: 102 mEq/L (ref 96–112)
Creatinine, Ser: 0.97 mg/dL (ref 0.50–1.10)
Potassium: 4 mEq/L (ref 3.5–5.3)
Sodium: 139 mEq/L (ref 135–145)
Total Protein: 6.6 g/dL (ref 6.0–8.3)

## 2011-09-20 LAB — CANCER ANTIGEN 27.29: CA 27.29: 28 U/mL (ref 0–39)

## 2011-09-20 MED ORDER — SODIUM CHLORIDE 0.9 % IV SOLN
Freq: Once | INTRAVENOUS | Status: AC
  Administered 2011-09-20: 10:00:00 via INTRAVENOUS

## 2011-09-20 MED ORDER — HEPARIN SOD (PORK) LOCK FLUSH 100 UNIT/ML IV SOLN
500.0000 [IU] | Freq: Once | INTRAVENOUS | Status: AC | PRN
  Administered 2011-09-20: 500 [IU]
  Filled 2011-09-20: qty 5

## 2011-09-20 MED ORDER — ACETAMINOPHEN 325 MG PO TABS
650.0000 mg | ORAL_TABLET | Freq: Once | ORAL | Status: AC
  Administered 2011-09-20: 650 mg via ORAL

## 2011-09-20 MED ORDER — TRASTUZUMAB CHEMO INJECTION 440 MG
8.0000 mg/kg | Freq: Once | INTRAVENOUS | Status: AC
  Administered 2011-09-20: 672 mg via INTRAVENOUS
  Filled 2011-09-20: qty 32

## 2011-09-20 MED ORDER — SODIUM CHLORIDE 0.9 % IJ SOLN
10.0000 mL | INTRAMUSCULAR | Status: DC | PRN
  Administered 2011-09-20: 10 mL
  Filled 2011-09-20: qty 10

## 2011-09-20 MED ORDER — FULVESTRANT 250 MG/5ML IM SOLN
500.0000 mg | INTRAMUSCULAR | Status: DC
  Administered 2011-09-20: 500 mg via INTRAMUSCULAR
  Filled 2011-09-20: qty 10

## 2011-10-08 ENCOUNTER — Ambulatory Visit (HOSPITAL_COMMUNITY)
Admission: RE | Admit: 2011-10-08 | Discharge: 2011-10-08 | Disposition: A | Discharge status: Home / Self Care | Payer: PRIVATE HEALTH INSURANCE | Source: Ambulatory Visit | Attending: Internal Medicine | Admitting: Internal Medicine

## 2011-10-08 ENCOUNTER — Ambulatory Visit (HOSPITAL_COMMUNITY)
Admission: RE | Admit: 2011-10-08 | Discharge: 2011-10-08 | Disposition: A | Discharge status: Home / Self Care | Payer: PRIVATE HEALTH INSURANCE | Source: Ambulatory Visit | Attending: Physician Assistant | Admitting: Physician Assistant

## 2011-10-08 VITALS — BP 136/54 | HR 63 | Wt 174.8 lb

## 2011-10-08 DIAGNOSIS — I059 Rheumatic mitral valve disease, unspecified: Secondary | ICD-10-CM

## 2011-10-08 DIAGNOSIS — C50919 Malignant neoplasm of unspecified site of unspecified female breast: Secondary | ICD-10-CM | POA: Insufficient documentation

## 2011-10-08 DIAGNOSIS — I10 Essential (primary) hypertension: Secondary | ICD-10-CM

## 2011-10-08 DIAGNOSIS — Z79899 Other long term (current) drug therapy: Secondary | ICD-10-CM | POA: Insufficient documentation

## 2011-10-08 NOTE — Progress Notes (Signed)
Referring Physician: [name] Primary Care: [name]  Primary Cardiologist: none  HPI: Rebecca Pugh is a 61 y.o. female with history of stage IV breast cancer initially diagnosed in 2007.  She underwent 4 cycles of adjuvant paclitaxel/carboplatin/trastuzumab (completed 04/2006) and trastuzumab continued for 1 year.  She had postmastectomy radiation completed 07/2006.  Anastrozole began in 07/2006.  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  Started on tamoxifen, lapatinib, and trastuzumab between June and October of 2012, with progression.  She is now on ixabepilone, lapatinib, and trastuzumab beginning November of 2012, with fair tolerance.  She has completed four cycles of ixabepilone/lapatinib and will remain on herceptin indefinitely.    Echos: 11/2010: EF 60-65% with poor windows to evaluate lateral S' 03/2011: EF60-65% with lateral S' peaks of 10.1 07/10/11: EF 55-60% lateral s' 9.6 10/08/11: EF 60%, lateral s' 9.6  She returns for follow up today.  She feels good.  She has just returned from vacation from [place].   She denies SOB/orthopnea/PND or edema.  She denies CP/syncope.  Continues on herceptin q3 weeks  Review of Systems: All pertinent positives and negatives as in HPI, otherwise negative.     Past Medical History  Diagnosis Date  . Hypertension   . Depression   . breast ca dx'd 01/15/2006    chemo/xrt comp    Current Outpatient Prescriptions  Medication Sig Dispense Refill  . calcium citrate (CALCITRATE - DOSED IN MG ELEMENTAL CALCIUM) 950 MG tablet Take 1 tablet by mouth daily.        . cholecalciferol (VITAMIN D) 1000 UNITS tablet Take 3,000 Units by mouth daily.         . diphenhydrAMINE (BENADRYL) 25 MG tablet Take 25 mg by mouth every 6 (six) hours as needed.      . hydrocodone-acetaminophen (LORCET-HD) 5-500 MG per capsule Take 1 capsule by mouth every 6 (six) hours as needed.        . lapatinib (TYKERB) 250 MG tablet Take 1,000 mg by mouth daily.       Marland Kitchen loperamide (IMODIUM) 2 MG capsule Take 2 mg by mouth 4 (four) times daily as needed.        . loratadine (CLARITIN) 10 MG tablet Take 10 mg by mouth daily.      Marland Kitchen LORazepam (ATIVAN) 0.5 MG tablet Take 1 tablet (0.5 mg total) by mouth 2 (two) times daily as needed.  30 tablet  0  . losartan (COZAAR) 50 MG tablet Take 1 tablet (50 mg total) by mouth daily.  30 tablet  6  . potassium chloride (KLOR-CON) 20 MEQ packet Take 20 mEq by mouth 2 (two) times daily.        . prochlorperazine (COMPAZINE) 10 MG tablet Take 10 mg by mouth every 6 (six) hours as needed.        . venlafaxine (EFFEXOR-XR) 75 MG 24 hr capsule TAKE ONE CAPSULE BY MOUTH EVERY DAY  30 capsule  6    No Known Allergies   PHYSICAL EXAM: Filed Vitals:   10/08/11 1344  BP: 136/54  Pulse: 63  Weight: 174 lb 12 oz (79.266 kg)  SpO2: 97%   General:  Well appearing. No respiratory difficulty HEENT:  normal Neck: supple. no JVD. Carotids 2+ bilat; no bruits. No lymphadenopathy or thryomegaly appreciated. Cor: PMI nondisplaced. Regular rate & rhythm. No rubs, gallops or murmurs. Lungs: clear Abdomen: soft, nontender, nondistended. No hepatosplenomegaly. No bruits or masses. Good bowel sounds. Extremities: no cyanosis, clubbing, rash, edema Neuro: alert & oriented x 3, cranial nerves grossly intact. moves all 4 extremities w/o difficulty. Affect pleasant.   ASSESSMENT & PLAN:

## 2011-10-08 NOTE — Progress Notes (Signed)
  Echocardiogram 2D Echocardiogram has been performed.  TIENA, MANANSALA A 10/08/2011, 11:51 AM

## 2011-10-08 NOTE — Assessment & Plan Note (Addendum)
Reviewed echos in full.  EF and lateral s' remain stable.  Continue herceptin q3 weeks.  Follow up in 3 months with repeat echo.  Patient seen and examined with Ulyess Blossom, PA-C. We discussed all aspects of the encounter. I agree with the assessment and plan as stated above.  I reviewed echos personally. All parameters stable. No evidence of HF on exam. Continue Herceptin.

## 2011-10-08 NOTE — Assessment & Plan Note (Addendum)
Continue losartan.   Attending: BP looks good. Continue losartan.

## 2011-10-16 ENCOUNTER — Telehealth: Payer: Self-pay | Admitting: *Deleted

## 2011-10-16 ENCOUNTER — Other Ambulatory Visit: Payer: Self-pay | Admitting: *Deleted

## 2011-10-16 ENCOUNTER — Encounter: Payer: Self-pay | Admitting: Oncology

## 2011-10-16 ENCOUNTER — Ambulatory Visit (HOSPITAL_BASED_OUTPATIENT_CLINIC_OR_DEPARTMENT_OTHER): Payer: PRIVATE HEALTH INSURANCE | Admitting: Oncology

## 2011-10-16 ENCOUNTER — Ambulatory Visit (HOSPITAL_BASED_OUTPATIENT_CLINIC_OR_DEPARTMENT_OTHER): Payer: PRIVATE HEALTH INSURANCE

## 2011-10-16 ENCOUNTER — Telehealth: Payer: Self-pay | Admitting: Oncology

## 2011-10-16 ENCOUNTER — Other Ambulatory Visit (HOSPITAL_BASED_OUTPATIENT_CLINIC_OR_DEPARTMENT_OTHER): Payer: PRIVATE HEALTH INSURANCE | Admitting: Lab

## 2011-10-16 VITALS — BP 162/79 | HR 79 | Temp 97.7°F | Ht 65.0 in | Wt 176.3 lb

## 2011-10-16 DIAGNOSIS — Z5111 Encounter for antineoplastic chemotherapy: Secondary | ICD-10-CM

## 2011-10-16 DIAGNOSIS — C50919 Malignant neoplasm of unspecified site of unspecified female breast: Secondary | ICD-10-CM

## 2011-10-16 DIAGNOSIS — C773 Secondary and unspecified malignant neoplasm of axilla and upper limb lymph nodes: Secondary | ICD-10-CM

## 2011-10-16 DIAGNOSIS — B029 Zoster without complications: Secondary | ICD-10-CM

## 2011-10-16 DIAGNOSIS — C7970 Secondary malignant neoplasm of unspecified adrenal gland: Secondary | ICD-10-CM

## 2011-10-16 LAB — CBC WITH DIFFERENTIAL/PLATELET
BASO%: 1 % (ref 0.0–2.0)
EOS%: 5.2 % (ref 0.0–7.0)
LYMPH%: 32 % (ref 14.0–49.7)
MCH: 29.5 pg (ref 25.1–34.0)
MCHC: 34 g/dL (ref 31.5–36.0)
MCV: 86.8 fL (ref 79.5–101.0)
MONO%: 6.6 % (ref 0.0–14.0)
NEUT#: 2.8 10*3/uL (ref 1.5–6.5)
RBC: 4.24 10*6/uL (ref 3.70–5.45)
RDW: 13.6 % (ref 11.2–14.5)

## 2011-10-16 LAB — COMPREHENSIVE METABOLIC PANEL
AST: 19 U/L (ref 0–37)
Albumin: 3.8 g/dL (ref 3.5–5.2)
Alkaline Phosphatase: 96 U/L (ref 39–117)
BUN: 16 mg/dL (ref 6–23)
Potassium: 4 mEq/L (ref 3.5–5.3)
Sodium: 137 mEq/L (ref 135–145)

## 2011-10-16 LAB — CANCER ANTIGEN 27.29: CA 27.29: 23 U/mL (ref 0–39)

## 2011-10-16 MED ORDER — TRASTUZUMAB CHEMO INJECTION 440 MG
8.0000 mg/kg | Freq: Once | INTRAVENOUS | Status: AC
  Administered 2011-10-16: 672 mg via INTRAVENOUS
  Filled 2011-10-16: qty 32

## 2011-10-16 MED ORDER — FULVESTRANT 250 MG/5ML IM SOLN
500.0000 mg | INTRAMUSCULAR | Status: DC
  Administered 2011-10-16: 500 mg via INTRAMUSCULAR
  Filled 2011-10-16: qty 10

## 2011-10-16 MED ORDER — HEPARIN SOD (PORK) LOCK FLUSH 100 UNIT/ML IV SOLN
500.0000 [IU] | Freq: Once | INTRAVENOUS | Status: AC | PRN
  Administered 2011-10-16: 500 [IU]
  Filled 2011-10-16: qty 5

## 2011-10-16 MED ORDER — ACETAMINOPHEN 325 MG PO TABS
650.0000 mg | ORAL_TABLET | Freq: Once | ORAL | Status: AC
  Administered 2011-10-16: 650 mg via ORAL

## 2011-10-16 MED ORDER — VALACYCLOVIR HCL 1 G PO TABS: 1000.0000 mg | ORAL_TABLET | Freq: Every day | ORAL | Status: DC

## 2011-10-16 MED ORDER — SODIUM CHLORIDE 0.9 % IJ SOLN
10.0000 mL | INTRAMUSCULAR | Status: DC | PRN
  Administered 2011-10-16: 10 mL
  Filled 2011-10-16: qty 10

## 2011-10-16 MED ORDER — ACYCLOVIR 400 MG PO TABS: 400.0000 mg | ORAL_TABLET | Freq: Every day | ORAL | Status: AC

## 2011-10-16 MED ORDER — TRAMADOL HCL 50 MG PO TABS: 50.0000 mg | ORAL_TABLET | Freq: Four times a day (QID) | ORAL | Status: AC | PRN

## 2011-10-16 MED ORDER — SODIUM CHLORIDE 0.9 % IV SOLN
Freq: Once | INTRAVENOUS | Status: AC
  Administered 2011-10-16: 11:00:00 via INTRAVENOUS

## 2011-10-16 NOTE — Progress Notes (Signed)
ID: Rebecca Pugh   DOB: 1950-07-17  MR#: [id number]  HYQ#:[id number]  HISTORY OF PRESENT ILLNESS:  INTERVAL HISTORY: [name] returns today with her husband Gene for a followup of her last cancer. They have just returned from a trip to [place], where he had business and she visited 6 grandchildren.  REVIEW OF SYSTEMS: 3 days ago she developed a rash in her left flank and also left upper arm medially, consistent with shingles. This is itchy and painful. 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 She is still having numbness in her fingers and toes, and sometimes tingling and pain as well. Otherwise a detailed review of systems today was noncontributory  PAST MEDICAL HISTORY: Past Medical History  Diagnosis Date  . Hypertension   . Depression   . breast ca dx'd 01/15/2006    chemo/xrt comp  PAST MEDICAL HISTORY: Significant for remote tonsillectomy, remote cholecystectomy, status post appendectomy, multiple skin biopsies for what the patient said were simple keratoses, and status post port placement.  FAMILY HISTORY: The patient has no information regarding her father. She was brought up by her stepfather. The patient's mother died at the age of 32 with bladder cancer. The patient has one sister who is in good health. There is no history of breast or ovarian cancer in the family to the patient's knowledge.   GYNECOLOGIC HISTORY: She is GX, P2. She was premenopausal at the time of her initial breast cancer diagnosis  SOCIAL HISTORY: She and her husband, Gene, have been married 10 years. 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 She has two children, a daughter, Rebecca Pugh, who lives in [place] and is a Investment banker, corporate, and a son, Rebecca Pugh, also in [place], who works for [employer]. She has two grandchildren and three step-grandchildren from her own two children,  [name] and Rebecca Pugh, who are from her first marriage. Gene has one daughter from his first marriage and he has grandchildren through her. The patient was brought up as a Catholic but currently is not practicing. She and Gene are attending a [employer].      ADVANCED DIRECTIVES:  HEALTH MAINTENANCE: History  Substance Use Topics  . Smoking status: Former Games developer  . Smokeless tobacco: Former Neurosurgeon    Quit date: 06/17/1992  . Alcohol Use:      Colonoscopy:  PAP:  Bone density:  Lipid panel:  No Known Allergies  Current Outpatient Prescriptions  Medication Sig Dispense Refill  . calcium citrate (CALCITRATE - DOSED IN MG ELEMENTAL CALCIUM) 950 MG tablet Take 1 tablet by mouth daily.        . cholecalciferol (VITAMIN D) 1000 UNITS tablet Take 3,000 Units by mouth daily.        . diphenhydrAMINE (BENADRYL) 25 MG tablet Take 25 mg by mouth every 6 (six) hours as needed.      . hydrocodone-acetaminophen (LORCET-HD) 5-500 MG per capsule Take 1 capsule by mouth every 6 (six) hours as needed.        . lapatinib (TYKERB) 250 MG tablet Take 1,000 mg by mouth daily.       Marland Kitchen loperamide (IMODIUM) 2 MG capsule Take 2 mg by mouth 4 (four) times daily as needed.        . loratadine (CLARITIN) 10 MG tablet Take 10 mg by mouth daily.      Marland Kitchen LORazepam (ATIVAN)  0.5 MG tablet Take 1 tablet (0.5 mg total) by mouth 2 (two) times daily as needed.  30 tablet  0  . losartan (COZAAR) 50 MG tablet Take 1 tablet (50 mg total) by mouth daily.  30 tablet  6  . potassium chloride (KLOR-CON) 20 MEQ packet Take 20 mEq by mouth 2 (two) times daily.        . prochlorperazine (COMPAZINE) 10 MG tablet Take 10 mg by mouth every 6 (six) hours as needed.        . venlafaxine (EFFEXOR-XR) 75 MG 24 hr capsule TAKE ONE CAPSULE BY MOUTH EVERY DAY  30 capsule  6        OBJECTIVE: Middle-aged white woman in no acute distress Filed Vitals:   10/16/11 0922  BP: 162/79  Pulse: 79  Temp: 97.7 F (36.5 C)     Body mass  index is 29.34 kg/(m^2).    ECOG FS: 1  Sclerae unicteric Oropharynx clear No peripheral adenopathy Lungs no rales or rhonchi Heart regular rate and rhythm Abd benign MSK no focal spinal tenderness, no peripheral edema Neuro: nonfocal Breasts: The right breast is unremarkable. The left breast is status post mastectomy. There is no evidence of local recurrence. Skin: She has a palpable erythematous partly vesicular rash in the left flank, in a dermatomal distribution. There is a similar rash in the left upper arm. Photographs of this is being placed in the chart  LAB RESULTS: [facility] February 2013 was 0. Her CA 27-29 is now normal at 72. Lab Results  Component Value Date   WBC 5.2 10/16/2011   NEUTROABS 2.8 10/16/2011   HGB 12.5 10/16/2011   HCT 36.8 10/16/2011   MCV 86.8 10/16/2011   PLT 234 10/16/2011      Chemistry      Component Value Date/Time   NA 139 09/20/2011 0859   NA 142 11/05/2010 1036   K 4.0 09/20/2011 0859   K 4.5 11/05/2010 1036   CL 102 09/20/2011 0859   CL 99 11/05/2010 1036   CO2 30 09/20/2011 0859   CO2 26 11/05/2010 1036   BUN 17 09/20/2011 0859   BUN 14 11/05/2010 1036   CREATININE 0.97 09/20/2011 0859   CREATININE 0.8 11/05/2010 1036      Component Value Date/Time   CALCIUM 9.4 09/20/2011 0859   CALCIUM 8.8 11/05/2010 1036   ALKPHOS 99 09/20/2011 0859   ALKPHOS 106* 11/05/2010 1036   AST 20 09/20/2011 0859   AST 27 11/05/2010 1036   ALT 23 09/20/2011 0859   BILITOT 0.3 09/20/2011 0859   BILITOT 0.80 11/05/2010 1036       Lab Results  Component Value Date   LABCA2 28 09/20/2011    No components found with this basename: [id number]    No results found for this basename: INR:1;PROTIME:1 in the last 168 hours  Urinalysis No results found for this basename: colorurine, appearanceur, labspec, phurine, glucoseu, hgbur, bilirubinur, ketonesur, proteinur, urobilinogen, nitrite, leukocytesur    STUDIES: Echocardiogram April 2013 shows a well preserved ejection  fraction.  ASSESSMENT: 61 year old [place] woman with stage IV breast cancer.  1. 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 2. Status post 4 cycles of adjuvant paclitaxel, carboplatin and trastuzumab completed November 2007. 3. Postmastectomy radiation completed February 2008. 4. Anastrozole begun February 2008 (the trastuzumab also was continued to complete a year), continued until June 2012.  5. 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  6. On tamoxifen/lapatinib/trastuzumab between June 2012 and October 2012.  7. Ixempra/lapatinib/trastuzumab started October 2012, the Ixempra discontinued in February 2013 due to peripheral neuropathy. 8. Continuing on lapatinib/trastuzumab with the addition of fulvestrant, first injections given on 07/31/2011. Lapatinib given at a dose of 1 g (4 tablets) daily, and trastuzumab given every 4 weeks to coordinate with injection appointments.  PLAN: As far as the shingles is concerned, I wrote her for Valtrex 1 g daily for the next week, and then we will start acyclovir year 400 mg daily for prophylaxis. She will use tramadol 50 mg 4 times a day as needed for pain.  As far as the breast cancer goes, she is doing terrific clinically, and we are going to continue the current treatment another of 4-6 months at least. I she will see [place] on an every 2 month basis. I am not planning to repeat scans unless there is a rise in her CA 2729, or new clinical symptoms. She will need a repeat echocardiogram in August.  Of note that she is going on a new insurance program in July, and it is may present a significant financial burden on her. She will discuss this with our financial counselors.   Nabeeha Badertscher C    10/16/2011

## 2011-10-16 NOTE — Patient Instructions (Signed)
[facility] Discharge Instructions for Patients Receiving Chemotherapy  Today you received the following chemotherapy agents Herceptin.   If you develop nausea and vomiting that is not controlled by your nausea medication, call the clinic. If it is after clinic hours your family physician or the after hours number for the clinic or go to the Emergency Department.   BELOW ARE SYMPTOMS THAT SHOULD BE REPORTED IMMEDIATELY:  *FEVER GREATER THAN 100.5 F  *CHILLS WITH OR WITHOUT FEVER  NAUSEA AND VOMITING THAT IS NOT CONTROLLED WITH YOUR NAUSEA MEDICATION  *UNUSUAL SHORTNESS OF BREATH  *UNUSUAL BRUISING OR BLEEDING  TENDERNESS IN MOUTH AND THROAT WITH OR WITHOUT PRESENCE OF ULCERS  *URINARY PROBLEMS  *BOWEL PROBLEMS  UNUSUAL RASH Items with * indicate a potential emergency and should be followed up as soon as possible.  One of the nurses will contact you 24 hours after your treatment. Please let the nurse know about any problems that you may have experienced. Feel free to call the clinic you have any questions or concerns. The clinic phone number is [phone].   I have been informed and understand all the instructions given to me. I know to contact the clinic, my physician, or go to the Emergency Department if any problems should occur. I do not have any questions at this time, but understand that I may call the clinic during office hours   should I have any questions or need assistance in obtaining follow up care.    __________________________________________  _____________  __________ Signature of Patient or Authorized Representative            Date                   Time    __________________________________________ Nurse's Signature   

## 2011-10-16 NOTE — Progress Notes (Signed)
Spoke with Tammy today to assist in finding some assistance for patient for Tykerb, contacted Beth with [employer] for getting patient assistance, I will be contacting patient today to go over what I have learned.

## 2011-10-16 NOTE — Telephone Encounter (Signed)
Called and spoke with Rebecca Pugh today, she wanted to set up some time to come in and speak with me. The appointment would include her and her husband. She set it up for May 16 th in the morning. We will be discussing the drug assistance for Tykerb.

## 2011-10-16 NOTE — Telephone Encounter (Signed)
gave patient appointment for 11-13-2011 thru 02-05-2012 emailed Marcelino Duster to set up treatment on 10-16-2011 printed out calendar with just the lab appointments on it

## 2011-10-18 LAB — CELL SEARCH FOR BREAST CANCER

## 2011-10-31 ENCOUNTER — Encounter: Payer: Self-pay | Admitting: Oncology

## 2011-10-31 ENCOUNTER — Telehealth: Payer: Self-pay | Admitting: Oncology

## 2011-10-31 NOTE — Progress Notes (Signed)
Patient came by today per our telephone conversation, I gave patient the application for [employer] for assistance with drug Tykerb. Patient and husband stated that they will fill out and bring back on next visit. Lanora Manis also came and talk with patient. I wanted to make sure I was taking patient in the right direction with the assistance on Tykerb.

## 2011-10-31 NOTE — Telephone Encounter (Signed)
gve the pt her may-sept 2013 appt calendars

## 2011-11-13 ENCOUNTER — Other Ambulatory Visit: Payer: PRIVATE HEALTH INSURANCE | Admitting: Lab

## 2011-11-14 ENCOUNTER — Other Ambulatory Visit (HOSPITAL_BASED_OUTPATIENT_CLINIC_OR_DEPARTMENT_OTHER): Payer: PRIVATE HEALTH INSURANCE | Admitting: Lab

## 2011-11-14 ENCOUNTER — Ambulatory Visit (HOSPITAL_BASED_OUTPATIENT_CLINIC_OR_DEPARTMENT_OTHER): Payer: PRIVATE HEALTH INSURANCE

## 2011-11-14 ENCOUNTER — Encounter: Payer: Self-pay | Admitting: Oncology

## 2011-11-14 VITALS — BP 179/91 | HR 69 | Temp 97.8°F

## 2011-11-14 DIAGNOSIS — C7970 Secondary malignant neoplasm of unspecified adrenal gland: Secondary | ICD-10-CM

## 2011-11-14 DIAGNOSIS — Z5112 Encounter for antineoplastic immunotherapy: Secondary | ICD-10-CM

## 2011-11-14 DIAGNOSIS — C50919 Malignant neoplasm of unspecified site of unspecified female breast: Secondary | ICD-10-CM

## 2011-11-14 DIAGNOSIS — C773 Secondary and unspecified malignant neoplasm of axilla and upper limb lymph nodes: Secondary | ICD-10-CM

## 2011-11-14 LAB — CBC WITH DIFFERENTIAL/PLATELET
BASO%: 0.9 % (ref 0.0–2.0)
LYMPH%: 34.1 % (ref 14.0–49.7)
MCH: 29.5 pg (ref 25.1–34.0)
MCHC: 34.2 g/dL (ref 31.5–36.0)
MCV: 86.4 fL (ref 79.5–101.0)
MONO%: 5.4 % (ref 0.0–14.0)
NEUT%: 55.1 % (ref 38.4–76.8)
Platelets: 257 10*3/uL (ref 145–400)
RBC: 4.2 10*6/uL (ref 3.70–5.45)

## 2011-11-14 LAB — COMPREHENSIVE METABOLIC PANEL
ALT: 22 U/L (ref 0–35)
Alkaline Phosphatase: 84 U/L (ref 39–117)
Creatinine, Ser: 0.92 mg/dL (ref 0.50–1.10)
Glucose, Bld: 173 mg/dL — ABNORMAL HIGH (ref 70–99)
Sodium: 138 mEq/L (ref 135–145)
Total Bilirubin: 0.7 mg/dL (ref 0.3–1.2)
Total Protein: 6.1 g/dL (ref 6.0–8.3)

## 2011-11-14 MED ORDER — TRASTUZUMAB CHEMO INJECTION 440 MG
8.0000 mg/kg | Freq: Once | INTRAVENOUS | Status: AC
  Administered 2011-11-14: 672 mg via INTRAVENOUS
  Filled 2011-11-14: qty 32

## 2011-11-14 MED ORDER — SODIUM CHLORIDE 0.9 % IJ SOLN
10.0000 mL | INTRAMUSCULAR | Status: DC | PRN
  Administered 2011-11-14: 10 mL
  Filled 2011-11-14: qty 10

## 2011-11-14 MED ORDER — ACETAMINOPHEN 325 MG PO TABS
650.0000 mg | ORAL_TABLET | Freq: Once | ORAL | Status: AC
  Administered 2011-11-14: 650 mg via ORAL

## 2011-11-14 MED ORDER — FULVESTRANT 250 MG/5ML IM SOLN
500.0000 mg | INTRAMUSCULAR | Status: DC
  Administered 2011-11-14: 500 mg via INTRAMUSCULAR
  Filled 2011-11-14: qty 10

## 2011-11-14 MED ORDER — HEPARIN SOD (PORK) LOCK FLUSH 100 UNIT/ML IV SOLN
500.0000 [IU] | Freq: Once | INTRAVENOUS | Status: AC | PRN
  Administered 2011-11-14: 500 [IU]
  Filled 2011-11-14: qty 5

## 2011-11-14 MED ORDER — SODIUM CHLORIDE 0.9 % IV SOLN
Freq: Once | INTRAVENOUS | Status: AC
  Administered 2011-11-14: 12:00:00 via INTRAVENOUS

## 2011-11-14 MED ORDER — DIPHENHYDRAMINE HCL 25 MG PO CAPS: 25.0000 mg | ORAL_CAPSULE | Freq: Once | ORAL | Status: DC

## 2011-11-14 NOTE — Progress Notes (Signed)
Patient came by and brought her application back for Tykerb [employer] completed what I needed and have given to nurse to have [name] sign.

## 2011-12-11 ENCOUNTER — Other Ambulatory Visit (HOSPITAL_BASED_OUTPATIENT_CLINIC_OR_DEPARTMENT_OTHER): Payer: PRIVATE HEALTH INSURANCE | Admitting: Lab

## 2011-12-11 ENCOUNTER — Ambulatory Visit (HOSPITAL_BASED_OUTPATIENT_CLINIC_OR_DEPARTMENT_OTHER): Payer: PRIVATE HEALTH INSURANCE

## 2011-12-11 ENCOUNTER — Ambulatory Visit (HOSPITAL_BASED_OUTPATIENT_CLINIC_OR_DEPARTMENT_OTHER): Payer: PRIVATE HEALTH INSURANCE | Admitting: Physician Assistant

## 2011-12-11 ENCOUNTER — Telehealth: Payer: Self-pay | Admitting: Oncology

## 2011-12-11 ENCOUNTER — Telehealth: Payer: Self-pay | Admitting: *Deleted

## 2011-12-11 ENCOUNTER — Encounter: Payer: Self-pay | Admitting: Physician Assistant

## 2011-12-11 VITALS — BP 137/79 | HR 80 | Temp 97.9°F | Ht 65.0 in | Wt 177.4 lb

## 2011-12-11 DIAGNOSIS — Z5111 Encounter for antineoplastic chemotherapy: Secondary | ICD-10-CM

## 2011-12-11 DIAGNOSIS — F329 Major depressive disorder, single episode, unspecified: Secondary | ICD-10-CM | POA: Insufficient documentation

## 2011-12-11 DIAGNOSIS — C7970 Secondary malignant neoplasm of unspecified adrenal gland: Secondary | ICD-10-CM

## 2011-12-11 DIAGNOSIS — C773 Secondary and unspecified malignant neoplasm of axilla and upper limb lymph nodes: Secondary | ICD-10-CM

## 2011-12-11 DIAGNOSIS — C50919 Malignant neoplasm of unspecified site of unspecified female breast: Secondary | ICD-10-CM

## 2011-12-11 DIAGNOSIS — Z1231 Encounter for screening mammogram for malignant neoplasm of breast: Secondary | ICD-10-CM

## 2011-12-11 LAB — COMPREHENSIVE METABOLIC PANEL
Albumin: 4 g/dL (ref 3.5–5.2)
Alkaline Phosphatase: 91 U/L (ref 39–117)
CO2: 25 mEq/L (ref 19–32)
Calcium: 9.4 mg/dL (ref 8.4–10.5)
Chloride: 105 mEq/L (ref 96–112)
Glucose, Bld: 100 mg/dL — ABNORMAL HIGH (ref 70–99)
Potassium: 4.4 mEq/L (ref 3.5–5.3)
Sodium: 138 mEq/L (ref 135–145)
Total Protein: 6.4 g/dL (ref 6.0–8.3)

## 2011-12-11 LAB — CBC WITH DIFFERENTIAL/PLATELET
Eosinophils Absolute: 0.2 10*3/uL (ref 0.0–0.5)
MONO#: 0.3 10*3/uL (ref 0.1–0.9)
NEUT#: 2.4 10*3/uL (ref 1.5–6.5)
RBC: 4.06 10*6/uL (ref 3.70–5.45)
RDW: 13.6 % (ref 11.2–14.5)
WBC: 4.4 10*3/uL (ref 3.9–10.3)
lymph#: 1.5 10*3/uL (ref 0.9–3.3)

## 2011-12-11 LAB — CANCER ANTIGEN 27.29: CA 27.29: 32 U/mL (ref 0–39)

## 2011-12-11 MED ORDER — FLUOXETINE HCL 20 MG PO CAPS: 20.0000 mg | ORAL_CAPSULE | Freq: Every day | ORAL | Status: DC

## 2011-12-11 MED ORDER — FULVESTRANT 250 MG/5ML IM SOLN
500.0000 mg | INTRAMUSCULAR | Status: DC
  Administered 2011-12-11: 500 mg via INTRAMUSCULAR
  Filled 2011-12-11: qty 10

## 2011-12-11 MED ORDER — VENLAFAXINE HCL ER 37.5 MG PO CP24: 37.5000 mg | ORAL_CAPSULE | Freq: Every day | ORAL | Status: DC

## 2011-12-11 MED ORDER — SODIUM CHLORIDE 0.9 % IJ SOLN
10.0000 mL | INTRAMUSCULAR | Status: DC | PRN
  Administered 2011-12-11: 10 mL
  Filled 2011-12-11: qty 10

## 2011-12-11 MED ORDER — SODIUM CHLORIDE 0.9 % IV SOLN
Freq: Once | INTRAVENOUS | Status: AC
  Administered 2011-12-11: 11:00:00 via INTRAVENOUS

## 2011-12-11 MED ORDER — HEPARIN SOD (PORK) LOCK FLUSH 100 UNIT/ML IV SOLN
500.0000 [IU] | Freq: Once | INTRAVENOUS | Status: AC | PRN
  Administered 2011-12-11: 500 [IU]
  Filled 2011-12-11: qty 5

## 2011-12-11 MED ORDER — TRASTUZUMAB CHEMO INJECTION 440 MG
8.0000 mg/kg | Freq: Once | INTRAVENOUS | Status: AC
  Administered 2011-12-11: 672 mg via INTRAVENOUS
  Filled 2011-12-11: qty 32

## 2011-12-11 MED ORDER — ACETAMINOPHEN 325 MG PO TABS
650.0000 mg | ORAL_TABLET | Freq: Once | ORAL | Status: AC
  Administered 2011-12-11: 650 mg via ORAL

## 2011-12-11 NOTE — Telephone Encounter (Signed)
gve the pt her sept 2013 appt calendar along with the appt for the echo and with dr benshimon. Pt is aware her chemo appt will be added for sept. Sent michelle a staff message

## 2011-12-11 NOTE — Progress Notes (Signed)
ID: Rebecca Pugh   DOB: [birth year]  MR#: [id number]  JXB#:[id number]  HISTORY OF PRESENT ILLNESS: The patient and her husband, Gene, moved back to this area recently from [facility] (Gene actually is a native of [place] and incidentally remains a [employer] fan).  As part of moving boxes, Rebecca Pugh felt something under her left arm.  She brought it to [name] attention and although the patient states that she has always had some cyclical swelling of her left axilla during menstruation, [name] set the patient up for evaluation at [facility].  On 12-12-05 the patient had a mammogram and ultrasound there and these showed no mass, distortion or microcalcifications in either breast however, in the left axilla there was a large macrolobulated mass and just above that there was a slightly prominent lymph node which did demonstrate a fatty hilum.  Physically on exam the mass was mobile and nontender and measured approximately 6.5 cm.  The ultrasound showed two small hypoechoic nodules in the left breast 3 cm. from the nipple measuring 7 mm. each and felt to be most likely benign however, the axillary mass, of course, was quite suspicious and biopsy was performed the same day.  This showed ([id number] and [id number]) invasive breast cancer which was strongly ER positive at 76%, moderately PR positive at 25% with a very high proliferation marker at 66%.  HER-2/neu was 1+ and FISH showed no amplification with a ratio of 1.1.    With this information, the patient was referred to Dr. [name] and on 12-20-05 the patient had bilateral breast MRI's.  In the left breast there was a 5.8 cm. axillary mass and several abnormally enlarged lymph nodes.  There was a 7 mm. left supraclavicular lymph node and the two nodules in the breast that had been noted by ultrasound were also noted by MRI measuring 1.1 and 0.7 cm.  In the right breast there were no abnormalities noted.  MR guided biopsies of the left breast  masses were obtained on 12-27-05 and both showed high grade ductal carcinoma.  There was evidence of lymphovascular invasion and high grade ductal carcinoma in situ as well ([id number]).    [name] discussed the situation with me and we felt it would be useful to have a PET scan prior to definitive surgery.  This was obtained on 12-30-05 and showed the primary axillary mass to have an SUV of 8.5.  The lymph nodes in the left axilla had SUV's in the 2.2 range, small focus of increased activity in the left breast had an SUV of 2.3.  There was also skin thickening with mildly increased diffuse FDG uptake within the skin suggesting an inflammatory breast cancer.  The neck, abdomen and  pelvis were negative.  In the lung windows, there were several scattered small areas of focal nodular density in the upper lobes with compressive atelectasis and this was felt to be benign.  Chest x-ray on 01-09-06 was negative.  With this information, [name] proceeded, after appropriate discussion, to left modified radical mastectomy on 01-15-06 and the insertion of a BARD port at the same time.  The final pathology ([id number]) showed the maximum tumor size to be 5 cm.  Margins were ample.  There was evidence of lymphovascular invasion.  This infiltrating ductal carcinoma was a grade 3 of 3 and 11 out of 14 lymph nodes were involved.  There was evidence of extracapsular extension.    The patient received 4 cycles of adjuvant  paclitaxel/carboplatin/trastuzumab which was completed in November 2007 and was followed by postmastectomy radiation. Trastuzumab was continued for one year. Radiation was completed in February 2008 at which time anastrozole was started. 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 Additional treatment plan is as detailed below.  INTERVAL HISTORY: Rebecca Pugh returns today  for followup of her metastatic breast carcinoma. She is due for her next monthly injection of fulvestrant and her next monthly infusion of trastuzumab.  Rebecca Pugh just returned from [facility] where her grandson graduated from high school. She had a nice trip, and overall is feeling well. She admits, but it has been a stressful couple of months. She had a fall from they're worse and was evaluated at the emergency room where she was diagnosed with a concussion. Fortunately, she's had no problems with headaches. They also had to put down her dog recently. Understandably, all of this has been very emotional. Rebecca Pugh is currently on Effexor 75 mg, but feels that she did much better on Prozac previously. (She was switched from Prozac to Effexor when on tamoxifen last year.) She wonders if she could possibly switch back to Prozac.  I will mention that although she admits to depression and some mild anxiety, she denies suicidal ideations.  REVIEW OF SYSTEMS: Rebecca Pugh has had no recent illnesses and denies fevers or chills. She continues to have frequent bowel movements while on the lapatinib, but denies any frank diarrhea. Basically, she finds that she has to have a bowel movement almost immediately after eating a meal. She's had no blood or mucus in the stool. No abdominal cramping. No nausea or emesis. She also denies any cough, shortness of breath, or chest pain. No orthopnea and no peripheral swelling. No increased peripheral neuropathy. No abnormal headaches, dizziness, or change in vision. No skin changes or abnormal bleeding.  Otherwise a detailed review of systems today was noncontributory  PAST MEDICAL HISTORY: Past Medical History  Diagnosis Date  . Hypertension   . Depression   . breast ca dx'd 01/15/2006    chemo/xrt comp  PAST MEDICAL HISTORY: Significant for remote tonsillectomy, remote cholecystectomy, status post appendectomy, multiple skin biopsies for what the patient said were simple keratoses, and  status post port placement.  FAMILY HISTORY: The patient has no information regarding her father. She was brought up by her stepfather. The patient's mother died at the age of 6 with bladder cancer. The patient has one sister who is in good health. There is no history of breast or ovarian cancer in the family to the patient's knowledge.   GYNECOLOGIC HISTORY: She is GX, P2. She was premenopausal at the time of her initial breast cancer diagnosis  SOCIAL HISTORY: She and her husband, Gene, have been married 10 years. 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 She has two children, a daughter, [name], who lives in [place] and is a Investment banker, corporate, and a son, [name], also in [place], who works for [employer]. She has two grandchildren and three step-grandchildren from her own two children, [name] and [name], who are from her first marriage. Gene has one daughter from his first marriage and he has grandchildren through her. The patient was brought up as a Catholic but currently is not practicing. She and Gene are attending a [employer].  ADVANCED DIRECTIVES:  HEALTH MAINTENANCE: History  Substance Use Topics  . Smoking status: Former Games developer  . Smokeless tobacco: Former Neurosurgeon    Quit date: 06/17/1992  . Alcohol Use:      Colonoscopy:  PAP:  Bone density: August 2012, "Normal"  Lipid panel:  No Known Allergies  Current Outpatient Prescriptions  Medication Sig Dispense Refill  . calcium citrate (CALCITRATE - DOSED IN MG ELEMENTAL CALCIUM) 950 MG tablet Take 1 tablet by mouth daily.        . cholecalciferol (VITAMIN D) 1000 UNITS tablet Take 3,000 Units by mouth daily.        . diphenhydrAMINE (BENADRYL) 25 MG tablet Take 25 mg by mouth every 6 (six) hours as needed.      Marland Kitchen FLUoxetine (PROZAC) 20 MG capsule Take 1 capsule (20 mg total) by mouth daily.  30 capsule  11  .  hydrocodone-acetaminophen (LORCET-HD) 5-500 MG per capsule Take 1 capsule by mouth every 6 (six) hours as needed.        . lapatinib (TYKERB) 250 MG tablet Take 1,000 mg by mouth daily.       Marland Kitchen loperamide (IMODIUM) 2 MG capsule Take 2 mg by mouth 4 (four) times daily as needed.        . loratadine (CLARITIN) 10 MG tablet Take 10 mg by mouth daily.      Marland Kitchen LORazepam (ATIVAN) 0.5 MG tablet Take 1 tablet (0.5 mg total) by mouth 2 (two) times daily as needed.  30 tablet  0  . losartan (COZAAR) 50 MG tablet Take 1 tablet (50 mg total) by mouth daily.  30 tablet  6  . potassium chloride (KLOR-CON) 20 MEQ packet Take 20 mEq by mouth 2 (two) times daily.        . prochlorperazine (COMPAZINE) 10 MG tablet Take 10 mg by mouth every 6 (six) hours as needed.        . valACYclovir (VALTREX) 1000 MG tablet Take 1 tablet (1,000 mg total) by mouth daily.  7 tablet  0  . venlafaxine (EFFEXOR-XR) 75 MG 24 hr capsule TAKE ONE CAPSULE BY MOUTH EVERY DAY  30 capsule  6  . venlafaxine XR (EFFEXOR-XR) 37.5 MG 24 hr capsule Take 1 capsule (37.5 mg total) by mouth daily.  30 capsule  1        OBJECTIVE: Middle-aged white woman in no acute distress Filed Vitals:   12/11/11 0934  BP: 137/79  Pulse: 80  Temp: 97.9 F (36.6 C)     Body mass index is 29.52 kg/(m^2).    ECOG FS: 1 Filed Weights   12/11/11 0934  Weight: 177 lb 6.4 oz (80.468 kg)   Physical Exam: HEENT:  Sclerae anicteric, conjunctivae pink.  Oropharynx clear.     Nodes:  No cervical, supraclavicular, or axillary lymphadenopathy palpated.  Breast Exam:  Deferred   Lungs:  Clear to auscultation bilaterally.  No crackles, rhonchi, or wheezes.   Heart:  Regular rate and rhythm.   Abdomen:  Soft, nontender.  Positive bowel sounds.  No organomegaly or masses palpated.   Musculoskeletal:  No focal spinal tenderness to palpation.  Extremities:  Benign.  No peripheral edema or cyanosis.   Skin:  Benign.   Neuro:  Nonfocal. Alert and oriented  x3.    LAB RESULTS: [facility] February 2013 was 0. Her CA 27-29 is now normal at 54. Lab Results  Component Value Date   WBC 4.4 12/11/2011   NEUTROABS 2.4 12/11/2011   HGB  12.1 12/11/2011   HCT 35.6 12/11/2011   MCV 87.7 12/11/2011   PLT 232 12/11/2011      Chemistry      Component Value Date/Time   NA 138 11/14/2011 1102   NA 142 11/05/2010 1036   K 4.1 11/14/2011 1102   K 4.5 11/05/2010 1036   CL 103 11/14/2011 1102   CL 99 11/05/2010 1036   CO2 25 11/14/2011 1102   CO2 26 11/05/2010 1036   BUN 12 11/14/2011 1102   BUN 14 11/05/2010 1036   CREATININE 0.92 11/14/2011 1102   CREATININE 0.8 11/05/2010 1036      Component Value Date/Time   CALCIUM 9.0 11/14/2011 1102   CALCIUM 8.8 11/05/2010 1036   ALKPHOS 84 11/14/2011 1102   ALKPHOS 106* 11/05/2010 1036   AST 18 11/14/2011 1102   AST 27 11/05/2010 1036   ALT 22 11/14/2011 1102   BILITOT 0.7 11/14/2011 1102   BILITOT 0.80 11/05/2010 1036       Lab Results  Component Value Date   LABCA2 25 11/14/2011   Both CMET and CA27.29 were repeated today, 12/11/2011, results pending.   STUDIES: Echocardiogram April 2013 shows a well preserved ejection fraction.  Patient is due for repeat echocardiogram in July.  Right screening mammogram is also due in August of this year.   ASSESSMENT: 61 year old Rebecca Pugh woman with stage IV breast cancer.  1. 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 2. Status post 4 cycles of adjuvant paclitaxel, carboplatin and trastuzumab completed November 2007. 3. Postmastectomy radiation completed February 2008. 4. Anastrozole begun February 2008 (the trastuzumab also was continued to complete a year), continued until June 2012.  5. 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  6. On tamoxifen/lapatinib/trastuzumab between June  2012 and October 2012.  7. Ixempra/lapatinib/trastuzumab started October 2012, the Ixempra discontinued in February 2013 due to peripheral neuropathy. 8. Continuing on lapatinib/trastuzumab with the addition of fulvestrant, first injections given on 07/31/2011. Lapatinib given at a dose of 1 g (4 tablets) daily, and trastuzumab given every 4 weeks to coordinate with injection appointments.  PLAN:  Rebecca Pugh will continue with her current regimen, specifically lapatinib/trastuzumab, and fulvestrant. She'll receive her next monthly injection of fulvestrant, along with her next monthly infusion of trastuzumab today.  The plan is to continue the current treatment another of 4-6 months at least. She will see [place] on an every 2 month basis, and is scheduled to see [name] again in September.  We are not planning to repeat scans unless there is a rise in her CA 2729, or new clinical symptoms. She is being scheduled for her next echocardiogram followed by an appointment with Dr. Gala Romney in July.  We did spend time today discussing Rebecca Pugh's antidepressants. She will slowly taper off of the venlafaxine, then will start back on fluoxetine 20 mg daily. She was given written instructions, as well as a calendar with a tapering schedule. I have written her prescriptions for both venlafaxine, 37.5 mg, and fluoxetine 20 mg which she will begin any few weeks.  Rebecca Pugh voices understanding and agreement with our plan and will call with any changes or problems.    Kendall Arnell    12/11/2011

## 2011-12-11 NOTE — Patient Instructions (Signed)
Pt tolerated chemo and hormonal injection without problems.   Pt aware of next return appts.   Pt was stable at discharge by self via ambulation. Pt declined Benadryl for premed prior to Herceptin.   Zollie Scale, PA notified.  OK to leave Benadryl off today.

## 2011-12-11 NOTE — Telephone Encounter (Signed)
gve the pt her mammo appt

## 2011-12-11 NOTE — Telephone Encounter (Signed)
Per staff message I have scheduled appt. JMW 

## 2012-01-08 ENCOUNTER — Other Ambulatory Visit (HOSPITAL_BASED_OUTPATIENT_CLINIC_OR_DEPARTMENT_OTHER): Payer: Medicare Other | Admitting: Lab

## 2012-01-08 ENCOUNTER — Ambulatory Visit (HOSPITAL_BASED_OUTPATIENT_CLINIC_OR_DEPARTMENT_OTHER): Payer: Medicare Other

## 2012-01-08 VITALS — BP 145/75 | HR 59 | Temp 97.7°F

## 2012-01-08 DIAGNOSIS — C50919 Malignant neoplasm of unspecified site of unspecified female breast: Secondary | ICD-10-CM

## 2012-01-08 DIAGNOSIS — Z5112 Encounter for antineoplastic immunotherapy: Secondary | ICD-10-CM

## 2012-01-08 DIAGNOSIS — Z5111 Encounter for antineoplastic chemotherapy: Secondary | ICD-10-CM

## 2012-01-08 LAB — COMPREHENSIVE METABOLIC PANEL
ALT: 27 U/L (ref 0–35)
CO2: 25 mEq/L (ref 19–32)
Calcium: 9.4 mg/dL (ref 8.4–10.5)
Chloride: 104 mEq/L (ref 96–112)
Creatinine, Ser: 0.91 mg/dL (ref 0.50–1.10)
Glucose, Bld: 100 mg/dL — ABNORMAL HIGH (ref 70–99)
Total Protein: 6.7 g/dL (ref 6.0–8.3)

## 2012-01-08 LAB — CBC WITH DIFFERENTIAL/PLATELET
Basophils Absolute: 0.1 10*3/uL (ref 0.0–0.1)
Eosinophils Absolute: 0.2 10*3/uL (ref 0.0–0.5)
HCT: 35.8 % (ref 34.8–46.6)
HGB: 12.1 g/dL (ref 11.6–15.9)
LYMPH%: 29.1 % (ref 14.0–49.7)
MCV: 87.7 fL (ref 79.5–101.0)
MONO#: 0.3 10*3/uL (ref 0.1–0.9)
NEUT#: 2.6 10*3/uL (ref 1.5–6.5)
Platelets: 224 10*3/uL (ref 145–400)
RBC: 4.08 10*6/uL (ref 3.70–5.45)
WBC: 4.5 10*3/uL (ref 3.9–10.3)
nRBC: 0 % (ref 0–0)

## 2012-01-08 MED ORDER — HEPARIN SOD (PORK) LOCK FLUSH 100 UNIT/ML IV SOLN
500.0000 [IU] | Freq: Once | INTRAVENOUS | Status: AC | PRN
  Administered 2012-01-08: 500 [IU]
  Filled 2012-01-08: qty 5

## 2012-01-08 MED ORDER — SODIUM CHLORIDE 0.9 % IV SOLN
Freq: Once | INTRAVENOUS | Status: AC
  Administered 2012-01-08: 10:00:00 via INTRAVENOUS

## 2012-01-08 MED ORDER — ACETAMINOPHEN 325 MG PO TABS
650.0000 mg | ORAL_TABLET | Freq: Once | ORAL | Status: AC
  Administered 2012-01-08: 650 mg via ORAL

## 2012-01-08 MED ORDER — FULVESTRANT 250 MG/5ML IM SOLN
500.0000 mg | INTRAMUSCULAR | Status: DC
  Administered 2012-01-08: 500 mg via INTRAMUSCULAR
  Filled 2012-01-08: qty 10

## 2012-01-08 MED ORDER — DIPHENHYDRAMINE HCL 25 MG PO CAPS: 25.0000 mg | ORAL_CAPSULE | Freq: Once | ORAL | Status: DC

## 2012-01-08 MED ORDER — TRASTUZUMAB CHEMO INJECTION 440 MG
8.0000 mg/kg | Freq: Once | INTRAVENOUS | Status: AC
  Administered 2012-01-08: 672 mg via INTRAVENOUS
  Filled 2012-01-08: qty 32

## 2012-01-08 MED ORDER — SODIUM CHLORIDE 0.9 % IJ SOLN
10.0000 mL | INTRAMUSCULAR | Status: DC | PRN
  Administered 2012-01-08: 10 mL
  Filled 2012-01-08: qty 10

## 2012-01-08 NOTE — Patient Instructions (Signed)
[facility] Discharge Instructions for Patients Receiving Chemotherapy  Today you received the following chemotherapy agents Herceptin To help prevent nausea and vomiting after your treatment, we encourage you to take your nausea medication as prescribed.  If you develop nausea and vomiting that is not controlled by your nausea medication, call the clinic. If it is after clinic hours your family physician or the after hours number for the clinic or go to the Emergency Department.   BELOW ARE SYMPTOMS THAT SHOULD BE REPORTED IMMEDIATELY:  *FEVER GREATER THAN 100.5 F  *CHILLS WITH OR WITHOUT FEVER  NAUSEA AND VOMITING THAT IS NOT CONTROLLED WITH YOUR NAUSEA MEDICATION  *UNUSUAL SHORTNESS OF BREATH  *UNUSUAL BRUISING OR BLEEDING  TENDERNESS IN MOUTH AND THROAT WITH OR WITHOUT PRESENCE OF ULCERS  *URINARY PROBLEMS  *BOWEL PROBLEMS  UNUSUAL RASH Items with * indicate a potential emergency and should be followed up as soon as possible.  One of the nurses will contact you 24 hours after your treatment. Please let the nurse know about any problems that you may have experienced. Feel free to call the clinic you have any questions or concerns. The clinic phone number is [phone].   I have been informed and understand all the instructions given to me. I know to contact the clinic, my physician, or go to the Emergency Department if any problems should occur. I do not have any questions at this time, but understand that I may call the clinic during office hours   should I have any questions or need assistance in obtaining follow up care.    __________________________________________  _____________  __________ Signature of Patient or Authorized Representative            Date                   Time    __________________________________________ Nurse's Signature    

## 2012-01-09 ENCOUNTER — Ambulatory Visit (HOSPITAL_COMMUNITY): Admission: RE | Admit: 2012-01-09 | Payer: Medicare Other | Source: Ambulatory Visit

## 2012-01-09 ENCOUNTER — Ambulatory Visit (HOSPITAL_COMMUNITY)
Admission: RE | Admit: 2012-01-09 | Discharge: 2012-01-09 | Disposition: A | Discharge status: Home / Self Care | Payer: Medicare Other | Source: Ambulatory Visit | Attending: Physician Assistant | Admitting: Physician Assistant

## 2012-01-09 DIAGNOSIS — C50919 Malignant neoplasm of unspecified site of unspecified female breast: Secondary | ICD-10-CM

## 2012-01-09 DIAGNOSIS — Z9010 Acquired absence of unspecified breast and nipple: Secondary | ICD-10-CM | POA: Insufficient documentation

## 2012-01-09 DIAGNOSIS — Z09 Encounter for follow-up examination after completed treatment for conditions other than malignant neoplasm: Secondary | ICD-10-CM | POA: Insufficient documentation

## 2012-01-09 DIAGNOSIS — C7970 Secondary malignant neoplasm of unspecified adrenal gland: Secondary | ICD-10-CM | POA: Insufficient documentation

## 2012-01-09 DIAGNOSIS — I517 Cardiomegaly: Secondary | ICD-10-CM | POA: Insufficient documentation

## 2012-01-09 DIAGNOSIS — I10 Essential (primary) hypertension: Secondary | ICD-10-CM | POA: Insufficient documentation

## 2012-01-09 NOTE — Progress Notes (Signed)
  Echocardiogram 2D Echocardiogram has been performed.  Cathie Beams 01/09/2012, 2:03 PM

## 2012-01-11 LAB — CELL SEARCH FOR BREAST CANCER

## 2012-01-15 ENCOUNTER — Ambulatory Visit (HOSPITAL_COMMUNITY)
Admission: RE | Admit: 2012-01-15 | Discharge: 2012-01-15 | Disposition: A | Discharge status: Home / Self Care | Payer: Medicare Other | Source: Ambulatory Visit | Attending: Internal Medicine | Admitting: Internal Medicine

## 2012-01-15 ENCOUNTER — Encounter (HOSPITAL_COMMUNITY): Payer: Self-pay

## 2012-01-15 VITALS — BP 120/68 | HR 72 | Ht 64.0 in | Wt 182.1 lb

## 2012-01-15 DIAGNOSIS — I10 Essential (primary) hypertension: Secondary | ICD-10-CM | POA: Insufficient documentation

## 2012-01-15 DIAGNOSIS — C50919 Malignant neoplasm of unspecified site of unspecified female breast: Secondary | ICD-10-CM | POA: Insufficient documentation

## 2012-01-15 NOTE — Progress Notes (Signed)
Referring Physician: [name] Primary Care: [name]  Primary Cardiologist: none  HPI: Rebecca Pugh is a 61 y.o. female with history of stage IV breast cancer initially diagnosed in 2007.  She underwent 4 cycles of adjuvant paclitaxel/carboplatin/trastuzumab (completed 04/2006) and trastuzumab continued for 1 year.  She had postmastectomy radiation completed 07/2006.  Anastrozole began in 07/2006.  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  Started on tamoxifen, lapatinib, and trastuzumab between June and October of 2012, with progression.  She is now on ixabepilone, lapatinib, and trastuzumab beginning November of 2012, with fair tolerance.  She has completed four cycles of ixabepilone/lapatinib and will remain on herceptin indefinitely.    Echos: 11/2010: EF 60-65% with poor windows to evaluate lateral S' 03/2011: EF60-65% with lateral S' peaks of 10.1 07/10/11: EF 55-60% lateral s' 9.6 10/08/11: EF 60%, lateral s' 9.6 01/09/12: EF 60-65%, lateral s' 11.5, 11.08  She returns for follow up today.  She is doing well.  She continues on herceptin q3 weeks.  She denies SOB/orthopnea/PND or edema.  She denies CP/syncope.    Review of Systems: All pertinent positives and negatives as in HPI, otherwise negative.     Past Medical History  Diagnosis Date  . Hypertension   . Depression   . breast ca dx'd 01/15/2006    chemo/xrt comp    Current Outpatient Prescriptions  Medication Sig Dispense Refill  . calcium citrate (CALCITRATE - DOSED IN MG ELEMENTAL CALCIUM) 950 MG tablet Take 1 tablet by mouth daily.        . cholecalciferol (VITAMIN D) 1000 UNITS tablet Take 3,000 Units by mouth daily.         . diphenhydrAMINE (BENADRYL) 25 MG tablet Take 25 mg by mouth every 6 (six) hours as needed.      Marland Kitchen FLUoxetine (PROZAC) 20 MG capsule Take 1 capsule (20 mg total) by mouth daily.  30 capsule  11  . hydrocodone-acetaminophen (LORCET-HD) 5-500 MG per capsule Take 1 capsule by mouth every 6 (six) hours as needed.        . lapatinib (TYKERB) 250 MG tablet Take 1,000 mg by mouth daily.       Marland Kitchen loperamide (IMODIUM) 2 MG capsule Take 2 mg by mouth 4 (four) times daily as needed.        . loratadine (CLARITIN) 10 MG tablet Take 10 mg by mouth daily.      Marland Kitchen LORazepam (ATIVAN) 0.5 MG tablet Take 1 tablet (0.5 mg total) by mouth 2 (two) times daily as needed.  30 tablet  0  . losartan (COZAAR) 50 MG tablet Take 1 tablet (50 mg total) by mouth daily.  30 tablet  6  . potassium chloride (KLOR-CON) 20 MEQ packet Take 20 mEq by mouth 2 (two) times daily.        . prochlorperazine (COMPAZINE) 10 MG tablet Take 10 mg by mouth every 6 (six) hours as needed.          No Known Allergies   PHYSICAL EXAM: Filed Vitals:   01/15/12 1416  BP: 120/68  Pulse: 72  Height: 5\' 4"  (1.626 m)  Weight: 182 lb 1.9 oz (82.609 kg)  SpO2: 98%   General:  Well appearing. No respiratory difficulty HEENT: normal Neck: supple. no JVD. Carotids 2+ bilat; no bruits. No lymphadenopathy or thryomegaly appreciated. Cor: PMI nondisplaced. Regular rate & rhythm. No rubs, gallops or murmurs. Lungs: clear Abdomen: soft, nontender, nondistended. No hepatosplenomegaly. No bruits or masses. Good bowel sounds. Extremities: no cyanosis, clubbing, rash, edema Neuro: alert & oriented x 3, cranial nerves grossly intact. moves all 4 extremities w/o difficulty. Affect pleasant.   ASSESSMENT & PLAN:

## 2012-01-15 NOTE — Assessment & Plan Note (Signed)
Reviewed echo with patient.  EF ok.  Continue herceptin therapy.  Repeat echo in 3 months.

## 2012-01-15 NOTE — Assessment & Plan Note (Signed)
Controlled, continue losartan

## 2012-01-15 NOTE — Patient Instructions (Signed)
Follow up 3 months with echo

## 2012-01-16 ENCOUNTER — Other Ambulatory Visit: Payer: Self-pay | Admitting: Oncology

## 2012-01-20 ENCOUNTER — Ambulatory Visit: Payer: PRIVATE HEALTH INSURANCE

## 2012-01-31 ENCOUNTER — Encounter (HOSPITAL_COMMUNITY): Payer: Self-pay | Admitting: *Deleted

## 2012-01-31 ENCOUNTER — Inpatient Hospital Stay (HOSPITAL_COMMUNITY): Payer: Medicare Other

## 2012-01-31 ENCOUNTER — Encounter (HOSPITAL_COMMUNITY): Payer: Self-pay | Admitting: Anesthesiology

## 2012-01-31 ENCOUNTER — Encounter (HOSPITAL_COMMUNITY)
Admission: AD | Disposition: A | Discharge status: Home Health | Payer: Self-pay | Source: Other Acute Inpatient Hospital | Attending: Orthopedic Surgery

## 2012-01-31 ENCOUNTER — Inpatient Hospital Stay (HOSPITAL_COMMUNITY): Payer: Medicare Other | Admitting: Anesthesiology

## 2012-01-31 ENCOUNTER — Inpatient Hospital Stay (HOSPITAL_COMMUNITY)
Admission: AD | Admit: 2012-01-31 | Discharge: 2012-02-03 | DRG: 494 | Disposition: A | Discharge status: Home Health | Payer: Medicare Other | Source: Other Acute Inpatient Hospital | Attending: Orthopedic Surgery | Admitting: Orthopedic Surgery

## 2012-01-31 DIAGNOSIS — Z9010 Acquired absence of unspecified breast and nipple: Secondary | ICD-10-CM

## 2012-01-31 DIAGNOSIS — F3289 Other specified depressive episodes: Secondary | ICD-10-CM | POA: Diagnosis present

## 2012-01-31 DIAGNOSIS — C50919 Malignant neoplasm of unspecified site of unspecified female breast: Secondary | ICD-10-CM

## 2012-01-31 DIAGNOSIS — F32A Depression, unspecified: Secondary | ICD-10-CM

## 2012-01-31 DIAGNOSIS — S82853A Displaced trimalleolar fracture of unspecified lower leg, initial encounter for closed fracture: Secondary | ICD-10-CM | POA: Diagnosis present

## 2012-01-31 DIAGNOSIS — Z9221 Personal history of antineoplastic chemotherapy: Secondary | ICD-10-CM

## 2012-01-31 DIAGNOSIS — F329 Major depressive disorder, single episode, unspecified: Secondary | ICD-10-CM | POA: Diagnosis present

## 2012-01-31 DIAGNOSIS — W19XXXA Unspecified fall, initial encounter: Secondary | ICD-10-CM | POA: Diagnosis present

## 2012-01-31 DIAGNOSIS — Z853 Personal history of malignant neoplasm of breast: Secondary | ICD-10-CM

## 2012-01-31 DIAGNOSIS — I10 Essential (primary) hypertension: Secondary | ICD-10-CM | POA: Diagnosis present

## 2012-01-31 DIAGNOSIS — Z79899 Other long term (current) drug therapy: Secondary | ICD-10-CM

## 2012-01-31 DIAGNOSIS — Z87891 Personal history of nicotine dependence: Secondary | ICD-10-CM

## 2012-01-31 DIAGNOSIS — Z923 Personal history of irradiation: Secondary | ICD-10-CM

## 2012-01-31 DIAGNOSIS — F419 Anxiety disorder, unspecified: Secondary | ICD-10-CM

## 2012-01-31 HISTORY — PX: ORIF ANKLE FRACTURE: SHX5408

## 2012-01-31 SURGERY — OPEN REDUCTION INTERNAL FIXATION (ORIF) ANKLE FRACTURE
Anesthesia: General | Site: Ankle | Laterality: Right | Wound class: Clean

## 2012-01-31 MED ORDER — HYDROMORPHONE HCL PF 1 MG/ML IJ SOLN
0.2500 mg | INTRAMUSCULAR | Status: DC | PRN
  Administered 2012-01-31 – 2012-02-01 (×5): 0.5 mg via INTRAVENOUS
  Filled 2012-01-31: qty 1

## 2012-01-31 MED ORDER — MORPHINE SULFATE 2 MG/ML IJ SOLN
2.0000 mg | INTRAMUSCULAR | Status: DC | PRN
  Administered 2012-01-31: 2 mg via INTRAVENOUS
  Filled 2012-01-31: qty 1

## 2012-01-31 MED ORDER — LACTATED RINGERS IV SOLN: INTRAVENOUS | Status: DC

## 2012-01-31 MED ORDER — SODIUM CHLORIDE 0.9 % IV SOLN
INTRAVENOUS | Status: DC
  Administered 2012-01-31: 19:00:00 via INTRAVENOUS

## 2012-01-31 MED ORDER — FENTANYL CITRATE 0.05 MG/ML IJ SOLN
INTRAMUSCULAR | Status: DC | PRN
  Administered 2012-01-31 (×2): 50 ug via INTRAVENOUS

## 2012-01-31 MED ORDER — EPHEDRINE SULFATE 50 MG/ML IJ SOLN
INTRAMUSCULAR | Status: DC | PRN
  Administered 2012-01-31 (×3): 5 mg via INTRAVENOUS

## 2012-01-31 MED ORDER — CEFAZOLIN SODIUM-DEXTROSE 2-3 GM-% IV SOLR
2.0000 g | INTRAVENOUS | Status: AC
  Administered 2012-01-31: 2 g via INTRAVENOUS
  Filled 2012-01-31: qty 50

## 2012-01-31 MED ORDER — LIDOCAINE HCL (CARDIAC) 20 MG/ML IV SOLN
INTRAVENOUS | Status: DC | PRN
  Administered 2012-01-31: 100 mg via INTRAVENOUS

## 2012-01-31 MED ORDER — KETOROLAC TROMETHAMINE 30 MG/ML IJ SOLN
15.0000 mg | Freq: Once | INTRAMUSCULAR | Status: AC | PRN
  Administered 2012-02-01: 30 mg via INTRAVENOUS

## 2012-01-31 MED ORDER — DEXAMETHASONE SODIUM PHOSPHATE 10 MG/ML IJ SOLN
INTRAMUSCULAR | Status: DC | PRN
  Administered 2012-01-31: 10 mg via INTRAVENOUS

## 2012-01-31 MED ORDER — NEOSTIGMINE METHYLSULFATE 1 MG/ML IJ SOLN
INTRAMUSCULAR | Status: DC | PRN
  Administered 2012-01-31: 4 mg via INTRAVENOUS

## 2012-01-31 MED ORDER — MIDAZOLAM HCL 5 MG/5ML IJ SOLN
INTRAMUSCULAR | Status: DC | PRN
  Administered 2012-01-31: 2 mg via INTRAVENOUS

## 2012-01-31 MED ORDER — SUCCINYLCHOLINE CHLORIDE 20 MG/ML IJ SOLN
INTRAMUSCULAR | Status: DC | PRN
  Administered 2012-01-31: 100 mg via INTRAVENOUS

## 2012-01-31 MED ORDER — 0.9 % SODIUM CHLORIDE (POUR BTL) OPTIME
TOPICAL | Status: DC | PRN
  Administered 2012-01-31: 1000 mL

## 2012-01-31 MED ORDER — GLYCOPYRROLATE 0.2 MG/ML IJ SOLN
INTRAMUSCULAR | Status: DC | PRN
  Administered 2012-01-31: .6 mg via INTRAVENOUS
  Administered 2012-01-31: 0.4 mg via INTRAVENOUS

## 2012-01-31 MED ORDER — ONDANSETRON HCL 4 MG/2ML IJ SOLN
INTRAMUSCULAR | Status: DC | PRN
  Administered 2012-01-31: 4 mg via INTRAVENOUS

## 2012-01-31 MED ORDER — LACTATED RINGERS IV SOLN
INTRAVENOUS | Status: DC | PRN
  Administered 2012-01-31: 21:00:00 via INTRAVENOUS

## 2012-01-31 MED ORDER — ROCURONIUM BROMIDE 100 MG/10ML IV SOLN
INTRAVENOUS | Status: DC | PRN
  Administered 2012-01-31: 20 mg via INTRAVENOUS

## 2012-01-31 MED ORDER — METHOCARBAMOL 100 MG/ML IJ SOLN
500.0000 mg | Freq: Four times a day (QID) | INTRAVENOUS | Status: DC | PRN
  Administered 2012-02-01: 500 mg via INTRAVENOUS
  Filled 2012-01-31: qty 5

## 2012-01-31 MED ORDER — METHOCARBAMOL 500 MG PO TABS
500.0000 mg | ORAL_TABLET | Freq: Four times a day (QID) | ORAL | Status: DC | PRN
  Administered 2012-02-01 – 2012-02-03 (×7): 500 mg via ORAL
  Filled 2012-01-31 (×7): qty 1

## 2012-01-31 MED ORDER — PROMETHAZINE HCL 25 MG/ML IJ SOLN: 6.2500 mg | INTRAMUSCULAR | Status: DC | PRN

## 2012-01-31 MED ORDER — PROPOFOL 10 MG/ML IV EMUL
INTRAVENOUS | Status: DC | PRN
  Administered 2012-01-31: 200 mg via INTRAVENOUS

## 2012-01-31 MED ORDER — CEFAZOLIN SODIUM-DEXTROSE 2-3 GM-% IV SOLR
2.0000 g | Freq: Once | INTRAVENOUS | Status: DC
  Filled 2012-01-31: qty 50

## 2012-01-31 MED ORDER — ACETAMINOPHEN 10 MG/ML IV SOLN
INTRAVENOUS | Status: DC | PRN
  Administered 2012-01-31: 1000 mg via INTRAVENOUS

## 2012-01-31 SURGICAL SUPPLY — 56 items
BAG SPEC THK2 15X12 ZIP CLS (MISCELLANEOUS)
BAG ZIPLOCK 12X15 (MISCELLANEOUS) ×1 IMPLANT
BANDAGE ELASTIC 4 VELCRO ST LF (GAUZE/BANDAGES/DRESSINGS) ×1 IMPLANT
BANDAGE ELASTIC 6 VELCRO ST LF (GAUZE/BANDAGES/DRESSINGS) ×2 IMPLANT
BIT DRILL 2.5X2.75 QC CALB (BIT) ×1 IMPLANT
CLOTH BEACON ORANGE TIMEOUT ST (SAFETY) ×2 IMPLANT
CUFF TOURN SGL QUICK 34 (TOURNIQUET CUFF) ×2
CUFF TRNQT CYL 34X4X40X1 (TOURNIQUET CUFF) ×1 IMPLANT
DRAPE C-ARM 42X72 X-RAY (DRAPES) ×2 IMPLANT
DRAPE ORTHO SPLIT 77X108 STRL (DRAPES) ×2
DRAPE SURG ORHT 6 SPLT 77X108 (DRAPES) IMPLANT
DRAPE U-SHAPE 47X51 STRL (DRAPES) ×2 IMPLANT
DRSG ADAPTIC 3X8 NADH LF (GAUZE/BANDAGES/DRESSINGS) ×2 IMPLANT
DRSG PAD ABDOMINAL 8X10 ST (GAUZE/BANDAGES/DRESSINGS) ×3 IMPLANT
DURAPREP 26ML APPLICATOR (WOUND CARE) ×2 IMPLANT
ELECT REM PT RETURN 9FT ADLT (ELECTROSURGICAL) ×2
ELECTRODE REM PT RTRN 9FT ADLT (ELECTROSURGICAL) ×1 IMPLANT
GLOVE BIO SURGEON STRL SZ7.5 (GLOVE) ×1 IMPLANT
GLOVE BIO SURGEON STRL SZ8 (GLOVE) ×2 IMPLANT
GLOVE SURG SS PI 6.5 STRL IVOR (GLOVE) ×3 IMPLANT
GLOVE SURG SS PI 8.5 STRL IVOR (GLOVE) ×2
GLOVE SURG SS PI 8.5 STRL STRW (GLOVE) IMPLANT
GOWN STRL NON-REIN LRG LVL3 (GOWN DISPOSABLE) ×4 IMPLANT
GOWN STRL REIN XL XLG (GOWN DISPOSABLE) ×2 IMPLANT
K-WIRE ACE 1.6X6 (WIRE) ×4
KIT BASIN OR (CUSTOM PROCEDURE TRAY) ×2 IMPLANT
KWIRE ACE 1.6X6 (WIRE) IMPLANT
MANIFOLD NEPTUNE II (INSTRUMENTS) ×2 IMPLANT
NS IRRIG 1000ML POUR BTL (IV SOLUTION) ×2 IMPLANT
PACK LOWER EXTREMITY WL (CUSTOM PROCEDURE TRAY) ×2 IMPLANT
PAD CAST 4YDX4 CTTN HI CHSV (CAST SUPPLIES) ×2 IMPLANT
PADDING CAST ABS 4INX4YD NS (CAST SUPPLIES) ×1
PADDING CAST ABS 6INX4YD NS (CAST SUPPLIES) ×2
PADDING CAST ABS COTTON 4X4 ST (CAST SUPPLIES) IMPLANT
PADDING CAST ABS COTTON 6X4 NS (CAST SUPPLIES) IMPLANT
PADDING CAST COTTON 4X4 STRL (CAST SUPPLIES)
PADDING CAST COTTON 6X4 STRL (CAST SUPPLIES) ×1 IMPLANT
PLATE ACE 100DEG 7HOLE (Plate) ×1 IMPLANT
POSITIONER SURGICAL ARM (MISCELLANEOUS) ×2 IMPLANT
SCREW ACE CAN 4.0 50M (Screw) ×1 IMPLANT
SCREW CORTICAL 3.5MM 14MM (Screw) ×4 IMPLANT
SCREW NLOCK CANC HEX 4X16 (Screw) ×1 IMPLANT
SCREW NLOCK CANC HEX 4X20 (Screw) ×2 IMPLANT
SCREW NLOCK CANC HEX 4X20 FIB (Screw) IMPLANT
SPONGE GAUZE 4X4 12PLY (GAUZE/BANDAGES/DRESSINGS) ×2 IMPLANT
STAPLER VISISTAT 35W (STAPLE) ×1 IMPLANT
STRIP CLOSURE SKIN 1/2X4 (GAUZE/BANDAGES/DRESSINGS) ×1 IMPLANT
SUT MNCRL AB 4-0 PS2 18 (SUTURE) ×1 IMPLANT
SUT VIC AB 1 CT1 27 (SUTURE) ×4
SUT VIC AB 1 CT1 27XBRD ANTBC (SUTURE) ×2 IMPLANT
SUT VIC AB 2-0 CT1 27 (SUTURE) ×2
SUT VIC AB 2-0 CT1 TAPERPNT 27 (SUTURE) ×1 IMPLANT
TOWEL OR 17X26 10 PK STRL BLUE (TOWEL DISPOSABLE) ×4 IMPLANT
TRAY FOLEY CATH 14FRSI W/METER (CATHETERS) ×1 IMPLANT
WASHER FLAT ACE (Orthopedic Implant) ×1 IMPLANT
WASHER PLAIN FLAT ACE NS 3PK (Orthopedic Implant) IMPLANT

## 2012-01-31 NOTE — Transfer of Care (Signed)
Immediate Anesthesia Transfer of Care Note  Patient: Rebecca Pugh  Procedure(s) Performed: Procedure(s) (LRB): OPEN REDUCTION INTERNAL FIXATION (ORIF) ANKLE FRACTURE (Right)  Patient Location: PACU  Anesthesia Type: General  Level of Consciousness: awake, alert , oriented and patient cooperative  Airway & Oxygen Therapy: Patient Spontanous Breathing and Patient connected to face mask oxygen  Post-op Assessment: Report given to PACU RN, Post -op Vital signs reviewed and stable and Patient moving all extremities  Post vital signs: Reviewed and stable  Complications: No apparent anesthesia complications

## 2012-01-31 NOTE — Brief Op Note (Signed)
01/31/2012  11:15 PM  PATIENT:  Rebecca Pugh  61 y.o. female  PRE-OPERATIVE DIAGNOSIS:  fractured right ankle- trimalleolar  POST-OPERATIVE DIAGNOSIS:  fractured right ankle-trimalleolar  PROCEDURE:  Procedure(s) (LRB): OPEN REDUCTION INTERNAL FIXATION (ORIF) ANKLE FRACTURE (Right)  SURGEON:  Surgeon(s) and Role:    * Loanne Drilling, MD - Primary  PHYSICIAN ASSISTANT:   ASSISTANTS: Dimitri Ped, PA-C   ANESTHESIA:   general  EBL:  Total I/O In: 1500 [I.V.:1500] Out: 550 [Urine:550]  COUNTS:  YES  TOURNIQUET:   Total Tourniquet Time Documented: Thigh (Right) - 45 minutes  DICTATION: .Other Dictation: Dictation Number [id number]  PLAN OF CARE: Admit to inpatient   PATIENT DISPOSITION:  PACU - hemodynamically stable.

## 2012-01-31 NOTE — Interval H&P Note (Signed)
History and Physical Interval Note:  01/31/2012 9:17 PM  Rebecca Pugh  has presented today for surgery, with the diagnosis of fractured right ankle  The various methods of treatment have been discussed with the patient and family. After consideration of risks, benefits and other options for treatment, the patient has consented to  Procedure(s) (LRB): OPEN REDUCTION INTERNAL FIXATION (ORIF) ANKLE FRACTURE (Right) as a surgical intervention .  The patient's history has been reviewed, patient examined, no change in status, stable for surgery.  I have reviewed the patient's chart and labs.  Questions were answered to the patient's satisfaction.     Loanne Drilling

## 2012-01-31 NOTE — Anesthesia Preprocedure Evaluation (Addendum)
Anesthesia Evaluation  Patient identified by MRN, date of birth, ID band Patient awake    Reviewed: Allergy & Precautions, H&P , NPO status , Patient's Chart, lab work & pertinent test results  Airway Mallampati: II TM Distance: <3 FB Neck ROM: Full    Dental No notable dental hx.    Pulmonary neg pulmonary ROS,  breath sounds clear to auscultation  Pulmonary exam normal       Cardiovascular hypertension, Pt. on medications Rhythm:Regular Rate:Normal     Neuro/Psych negative neurological ROS  negative psych ROS   GI/Hepatic negative GI ROS, Neg liver ROS,   Endo/Other  negative endocrine ROS  Renal/GU negative Renal ROS  negative genitourinary   Musculoskeletal negative musculoskeletal ROS (+)   Abdominal   Peds negative pediatric ROS (+)  Hematology negative hematology ROS (+)   Anesthesia Other Findings   Reproductive/Obstetrics negative OB ROS                           Anesthesia Physical Anesthesia Plan  ASA: II  Anesthesia Plan: General   Post-op Pain Management:    Induction: Intravenous  Airway Management Planned: LMA and Oral ETT  Additional Equipment:   Intra-op Plan:   Post-operative Plan: Extubation in OR  Informed Consent: I have reviewed the patients History and Physical, chart, labs and discussed the procedure including the risks, benefits and alternatives for the proposed anesthesia with the patient or authorized representative who has indicated his/her understanding and acceptance.   Dental advisory given  Plan Discussed with: CRNA and Surgeon  Anesthesia Plan Comments:         Anesthesia Quick Evaluation

## 2012-01-31 NOTE — H&P (Signed)
Rebecca Pugh is an 61 y.o. female.   Chief Complaint: right ankle pain HPI: The patient is a 61 year old female who presents with the chief complaint of right ankle. She reports that this morning she was at home when she stepped on a hickory acorn. She slipped and fell, rolling over the right ankle. She reports that she immediately had pain and swelling. She also looked at the ankle and said that "it was flopped to the side". She was transported to [facility]. X-rays were done showing bimalleolar fracture with dislocation. The ED doctor there attempted to reduce the ankle twice. She was subsequently transported to [facility]. She denies numbness and tingling in the foot. No pain in the right knee. She denies history of previous ankle injury on the right. She did have a calcaneal fracture on the left treated by Dr. Lestine Box about two years ago. She has a history of breast cancer with a left mastectomy. She has no history of heart or lung disease but is currently on a caner medication that could have adverse effects on the heart. She has an echo every 3 months, all of which have been normal.   Past Medical History  Diagnosis Date  . Hypertension   . Depression   . breast ca dx'd 01/15/2006    chemo/xrt comp    Past Surgical History  Procedure Date  . Breast surgery 2007    left mastectomy  . Cholecystectomy   . Appendectomy     History reviewed. No pertinent family history. Social History:  reports that she quit smoking about 17 years ago. She has never used smokeless tobacco. She reports that she drinks alcohol. She reports that she does not use illicit drugs.  Allergies: No Known Allergies  Medications Prior to Admission  Medication Sig Dispense Refill  . calcium citrate (CALCITRATE - DOSED IN MG ELEMENTAL CALCIUM) 950 MG tablet Take 1 tablet by mouth daily.        . cholecalciferol (VITAMIN D) 1000 UNITS tablet Take 3,000 Units by mouth daily.        Marland Kitchen FLUoxetine (PROZAC) 20 MG  capsule Take 1 capsule (20 mg total) by mouth daily.  30 capsule  11  . lapatinib (TYKERB) 250 MG tablet Take 750 mg by mouth daily.       Marland Kitchen loperamide (IMODIUM) 2 MG capsule Take 2 mg by mouth 4 (four) times daily as needed. diarrhea      . LORazepam (ATIVAN) 0.5 MG tablet Take 1 tablet (0.5 mg total) by mouth 2 (two) times daily as needed.  30 tablet  0  . losartan (COZAAR) 50 MG tablet Take 1 tablet (50 mg total) by mouth daily.  30 tablet  6  . potassium chloride (KLOR-CON) 20 MEQ packet Take 20 mEq by mouth daily.          Review of Systems  Constitutional: Negative.   HENT: Negative.  Negative for neck pain.   Eyes: Negative.   Respiratory: Negative.   Cardiovascular: Negative.   Gastrointestinal: Negative.   Genitourinary: Negative.   Musculoskeletal: Positive for joint pain and falls. Negative for myalgias and back pain.  Skin: Negative.   Neurological: Negative.   Endo/Heme/Allergies: Negative.   Psychiatric/Behavioral: Negative.     Blood pressure 125/72, pulse 53, temperature 97.9 F (36.6 C), temperature source Oral, resp. rate 18, height 5\' 4"  (1.626 m), weight 80.74 kg (178 lb), SpO2 97.00%. Physical Exam  Constitutional: She is oriented to person, place, and time.  She appears well-developed and well-nourished. No distress.  HENT:  Head: Normocephalic and atraumatic.  Right Ear: External ear normal.  Left Ear: External ear normal.  Nose: Nose normal.  Mouth/Throat: Oropharynx is clear and moist.  Eyes: Conjunctivae and EOM are normal. Right eye exhibits no discharge. Left eye exhibits no discharge.  Neck: Normal range of motion. Neck supple. No tracheal deviation present. No thyromegaly present.  Cardiovascular: Normal rate, regular rhythm, normal heart sounds and intact distal pulses.   No murmur heard. Pulses:      Dorsalis pedis pulses are 2+ on the right side, and 2+ on the left side.  Respiratory: Effort normal and breath sounds normal. No respiratory  distress. She has no wheezes. She has no rales. She exhibits no tenderness.  GI: Soft. Bowel sounds are normal. She exhibits no distension and no mass. There is no tenderness.  Musculoskeletal:       Right knee: Normal.       Left knee: Normal.       Right ankle: She exhibits decreased range of motion, swelling and ecchymosis. tenderness. Lateral malleolus and medial malleolus tenderness found.       Left ankle: Normal.  Lymphadenopathy:    She has no cervical adenopathy.  Neurological: She is alert and oriented to person, place, and time. No sensory deficit.  Skin: No rash noted. She is not diaphoretic. No erythema.  Psychiatric: She has a normal mood and affect. Her behavior is normal. Judgment and thought content normal.     Assessment/Plan Bimalleolar ankle fracture, right ankle Will start NS at 75. Patient has been NPO all day and will continue that. Give morphine 2mg  IV for pain. Will take patient to OR tonight for ORIF right ankle.   CONSTABLE, AMBER LAUREN 01/31/2012, 6:15 PM   I have seen and examined the patient. She had a mechanical fall earlier today sustaining a trimalleolar right ankle fracture. I have discussed treatment options with her and she opts for surgical fixation. We discussed the procedure risks potential complications and rehab course and she elects to proceed.

## 2012-01-31 NOTE — Preoperative (Signed)
Beta Blockers   Reason not to administer Beta Blockers:Not Applicable 

## 2012-02-01 LAB — MRSA PCR SCREENING: MRSA by PCR: POSITIVE — AB

## 2012-02-01 LAB — BASIC METABOLIC PANEL
BUN: 13 mg/dL (ref 6–23)
GFR calc Af Amer: 73 mL/min — ABNORMAL LOW (ref >90)
GFR calc non Af Amer: 63 mL/min — ABNORMAL LOW (ref >90)
Potassium: 4.5 mEq/L (ref 3.5–5.1)
Sodium: 136 mEq/L (ref 135–145)

## 2012-02-01 MED ORDER — LOPERAMIDE HCL 2 MG PO CAPS: 2.0000 mg | ORAL_CAPSULE | Freq: Four times a day (QID) | ORAL | Status: DC | PRN

## 2012-02-01 MED ORDER — METOCLOPRAMIDE HCL 10 MG PO TABS: 5.0000 mg | ORAL_TABLET | Freq: Three times a day (TID) | ORAL | Status: DC | PRN

## 2012-02-01 MED ORDER — POTASSIUM CHLORIDE 20 MEQ PO PACK
20.0000 meq | PACK | Freq: Every day | ORAL | Status: DC
  Filled 2012-02-01: qty 1

## 2012-02-01 MED ORDER — ENOXAPARIN SODIUM 30 MG/0.3ML ~~LOC~~ SOLN
30.0000 mg | Freq: Two times a day (BID) | SUBCUTANEOUS | Status: DC
  Administered 2012-02-01 – 2012-02-03 (×5): 30 mg via SUBCUTANEOUS
  Filled 2012-02-01 (×6): qty 0.3

## 2012-02-01 MED ORDER — FLEET ENEMA 7-19 GM/118ML RE ENEM: 1.0000 | ENEMA | Freq: Once | RECTAL | Status: AC | PRN

## 2012-02-01 MED ORDER — MORPHINE SULFATE 2 MG/ML IJ SOLN
1.0000 mg | INTRAMUSCULAR | Status: DC | PRN
  Administered 2012-02-01: 2 mg via INTRAVENOUS
  Filled 2012-02-01: qty 1

## 2012-02-01 MED ORDER — LOSARTAN POTASSIUM 50 MG PO TABS
50.0000 mg | ORAL_TABLET | Freq: Every day | ORAL | Status: DC
  Administered 2012-02-01 – 2012-02-03 (×3): 50 mg via ORAL
  Filled 2012-02-01 (×3): qty 1

## 2012-02-01 MED ORDER — LAPATINIB DITOSYLATE 250 MG PO TABS
750.0000 mg | ORAL_TABLET | Freq: Every day | ORAL | Status: DC
  Administered 2012-02-01 – 2012-02-03 (×3): 750 mg via ORAL
  Filled 2012-02-01: qty 3

## 2012-02-01 MED ORDER — CEFAZOLIN SODIUM 1-5 GM-% IV SOLN
1.0000 g | Freq: Four times a day (QID) | INTRAVENOUS | Status: AC
  Administered 2012-02-01 (×4): 1 g via INTRAVENOUS
  Filled 2012-02-01 (×3): qty 50

## 2012-02-01 MED ORDER — DOCUSATE SODIUM 100 MG PO CAPS: 100.0000 mg | ORAL_CAPSULE | Freq: Two times a day (BID) | ORAL | Status: DC

## 2012-02-01 MED ORDER — ONDANSETRON HCL 4 MG/2ML IJ SOLN: 4.0000 mg | Freq: Four times a day (QID) | INTRAMUSCULAR | Status: DC | PRN

## 2012-02-01 MED ORDER — POLYETHYLENE GLYCOL 3350 17 G PO PACK: 17.0000 g | PACK | Freq: Every day | ORAL | Status: DC | PRN

## 2012-02-01 MED ORDER — OXYCODONE HCL 5 MG PO TABS
5.0000 mg | ORAL_TABLET | ORAL | Status: DC | PRN
  Administered 2012-02-01: 5 mg via ORAL
  Administered 2012-02-01 – 2012-02-03 (×13): 10 mg via ORAL
  Filled 2012-02-01: qty 2
  Filled 2012-02-01: qty 1
  Filled 2012-02-01 (×12): qty 2

## 2012-02-01 MED ORDER — POTASSIUM CHLORIDE CRYS ER 20 MEQ PO TBCR
20.0000 meq | EXTENDED_RELEASE_TABLET | Freq: Every day | ORAL | Status: DC
  Administered 2012-02-01 – 2012-02-03 (×3): 20 meq via ORAL
  Filled 2012-02-01 (×2): qty 1

## 2012-02-01 MED ORDER — LORAZEPAM 0.5 MG PO TABS: 0.5000 mg | ORAL_TABLET | Freq: Two times a day (BID) | ORAL | Status: DC | PRN

## 2012-02-01 MED ORDER — METOCLOPRAMIDE HCL 5 MG/ML IJ SOLN: 5.0000 mg | Freq: Three times a day (TID) | INTRAMUSCULAR | Status: DC | PRN

## 2012-02-01 MED ORDER — BISACODYL 10 MG RE SUPP: 10.0000 mg | Freq: Every day | RECTAL | Status: DC | PRN

## 2012-02-01 MED ORDER — ONDANSETRON HCL 4 MG PO TABS: 4.0000 mg | ORAL_TABLET | Freq: Four times a day (QID) | ORAL | Status: DC | PRN

## 2012-02-01 MED ORDER — FLUOXETINE HCL 20 MG PO CAPS
20.0000 mg | ORAL_CAPSULE | Freq: Every day | ORAL | Status: DC
  Administered 2012-02-01 – 2012-02-03 (×3): 20 mg via ORAL
  Filled 2012-02-01 (×3): qty 1

## 2012-02-01 MED ORDER — TRAMADOL HCL 50 MG PO TABS
50.0000 mg | ORAL_TABLET | Freq: Four times a day (QID) | ORAL | Status: DC | PRN
  Administered 2012-02-02: 50 mg via ORAL
  Filled 2012-02-01: qty 1

## 2012-02-01 MED ORDER — SODIUM CHLORIDE 0.9 % IV SOLN: INTRAVENOUS | Status: DC

## 2012-02-01 NOTE — Progress Notes (Signed)
Rebecca Pugh  MRN: [id number] DOB/Age: Aug 08, 1950 61 y.o. Physician: Jacquelyne Balint Procedure: Procedure(s) (LRB): OPEN REDUCTION INTERNAL FIXATION (ORIF) ANKLE FRACTURE (Right)     Subjective: Pain controlled but hasn't been OOB yet, foley still in place. States has husband at home who works but went through similar treatment with calcaneus fracture 2 years ago on oppsite side  Vital Signs Temp:  [97.9 F (36.6 C)-98.4 F (36.9 C)] 98.1 F (36.7 C) (08/17 0424) Pulse Rate:  [53-77] 68  (08/17 0424) Resp:  [14-21] 14  (08/17 0424) BP: (115-159)/(60-75) 115/64 mmHg (08/17 0424) SpO2:  [95 %-100 %] 99 % (08/17 0424) Weight:  [80.74 kg (178 lb)] 80.74 kg (178 lb) (08/16 1550)  Lab Results No results found for this basename: WBC:2,HGB:2,HCT:2,PLT:2 in the last 72 hours BMET  Basename 02/01/12 0500  NA 136  K 4.5  CL 102  CO2 25  GLUCOSE 180*  BUN 13  CREATININE 0.97  CALCIUM 8.6   INR  Date Value Range Status  11/19/2010 1.01  0.00 - 1.49 Final     Exam Cam walker in place. Moves toes well, senastion intact        Plan OOB today DC foley,O2, heplock IV DC planning for tomorrow  [facility] for Dr.Kevin Supple 02/01/2012, 9:21 AM

## 2012-02-01 NOTE — Progress Notes (Signed)
Cm spoke with patient concerning dc planning. Per pt choice [facility] to provide [facility] services upon discharge. Patient's spouse present at bedside during interview. Patient states arrangements being made for assistance at home. Pt request RW, 3N1. [facility] notified of [facility] & DME referral. Md orders, demographics, H/P  faxed to [facility] at [phone]. No other needs specified at this time.   Leonie Green [phone]

## 2012-02-01 NOTE — Evaluation (Signed)
Occupational Therapy Evaluation Patient Details Name: Rebecca Pugh MRN: [id number] DOB: 10/04/1950 Today's Date: 02/01/2012 Time: 4098-1191 OT Time Calculation (min): 23 min  OT Assessment / Plan / Recommendation Clinical Impression  Pt doing well POD 1 s/p ORIF R ankle. Skilled OT indicated to maximize independence with BADLs to supervision level in prep for safe d/c home. Pt will likely not need f/u at d/c.    OT Assessment  Patient needs continued OT Services    Follow Up Recommendations  No OT follow up;Home health OT (based on progress)    Barriers to Discharge      Equipment Recommendations  Other (comment) (TBD)    Recommendations for Other Services    Frequency  Min 2X/week    Precautions / Restrictions Restrictions Weight Bearing Restrictions: Yes RLE Weight Bearing: Non weight bearing   Pertinent Vitals/Pain     ADL  Grooming: Simulated;Set up Where Assessed - Grooming: Unsupported sitting Upper Body Bathing: Simulated;Set up Where Assessed - Upper Body Bathing: Unsupported sitting Lower Body Bathing: Simulated;Minimal assistance Where Assessed - Lower Body Bathing: Supported sit to stand Upper Body Dressing: Simulated;Set up Where Assessed - Upper Body Dressing: Unsupported sitting Lower Body Dressing: Simulated;Minimal assistance Where Assessed - Lower Body Dressing: Supported sit to stand Toilet Transfer: Simulated;Min guard Statistician Method: Sit to Barista: Other (comment) Nurse, children's) Toileting - Clothing Manipulation and Hygiene: Simulated;Minimal assistance Where Assessed - Toileting Clothing Manipulation and Hygiene: Standing ADL Comments: Pt mobilizing well despite NWB status.    OT Diagnosis: Generalized weakness  OT Problem List: Decreased activity tolerance;Decreased knowledge of use of DME or AE;Pain OT Treatment Interventions: Self-care/ADL training;Therapeutic activities;DME and/or AE instruction;Patient/family  education   OT Goals Acute Rehab OT Goals OT Goal Formulation: With patient Time For Goal Achievement: 02/08/12 Potential to Achieve Goals: Good ADL Goals Pt Will Perform Grooming: with supervision;Standing at sink ADL Goal: Grooming - Progress: Goal set today Pt Will Perform Lower Body Bathing: with supervision;Sit to stand from chair;Sit to stand from bed ADL Goal: Lower Body Bathing - Progress: Goal set today Pt Will Perform Lower Body Dressing: with supervision;Sit to stand from bed;Sit to stand from chair (including CAM boot.) ADL Goal: Lower Body Dressing - Progress: Goal set today Pt Will Transfer to Toilet: with supervision;Ambulation;Regular height toilet;Stand pivot transfer ADL Goal: Toilet Transfer - Progress: Goal set today Pt Will Perform Toileting - Clothing Manipulation: with supervision;Standing ADL Goal: Toileting - Clothing Manipulation - Progress: Goal set today Pt Will Perform Toileting - Hygiene: with supervision;Sit to stand from 3-in-1/toilet ADL Goal: Toileting - Hygiene - Progress: Goal set today Pt Will Perform Tub/Shower Transfer: Tub transfer;Ambulation ADL Goal: Tub/Shower Transfer - Progress: Goal set today  Visit Information  Last OT Received On: 02/01/12 Assistance Needed: +1 PT/OT Co-Evaluation/Treatment: Yes    Subjective Data  Subjective: I broke my heel 2 years ago on the other foot. Patient Stated Goal: Not asked   Prior Functioning  Vision/Perception  Home Living Lives With: Spouse Available Help at Discharge: Available PRN/intermittently;Family Type of Home: House Home Access: Level entry Home Layout: Two level Alternate Level Stairs-Number of Steps: 24 Alternate Level Stairs-Rails: Right;Left;Can reach both Bathroom Shower/Tub: Engineer, manufacturing systems: Standard Bathroom Accessibility: Yes How Accessible: Accessible via walker Home Adaptive Equipment: Shower chair without back Prior Function Level of Independence:  Independent Able to Take Stairs?: Yes Driving: Yes Vocation: On disability Communication Communication: No difficulties Dominant Hand: Right      Cognition  Overall Cognitive Status:  Appears within functional limits for tasks assessed/performed Arousal/Alertness: Awake/alert Orientation Level: Appears intact for tasks assessed Behavior During Session: [facility] for tasks performed    Extremity/Trunk Assessment Right Upper Extremity Assessment RUE ROM/Strength/Tone: [facility] for tasks assessed Left Upper Extremity Assessment LUE ROM/Strength/Tone: Deficits LUE ROM/Strength/Tone Deficits: 4-/5 grossly from L mastectomy   Mobility Bed Mobility Bed Mobility: Supine to Sit Supine to Sit: 4: Min assist;HOB elevated;With rails Details for Bed Mobility Assistance: Min VCs for hand placement, sequencing and technique. Transfers Transfers: Sit to Stand;Stand to Sit Sit to Stand: 4: Min guard;With upper extremity assist;From bed;From elevated surface Stand to Sit: 4: Min guard;With upper extremity assist;To chair/3-in-1;With armrests Details for Transfer Assistance: Min VCs for hand placement.   Exercise    Balance    End of Session OT - End of Session Equipment Utilized During Treatment: Gait belt Activity Tolerance: Patient tolerated treatment well Patient left: in chair;with call bell/phone within reach  GO     Rebecca Pugh A OTR/L [phone] 02/01/2012, 11:26 AM

## 2012-02-01 NOTE — Op Note (Signed)
Rebecca Pugh, Rebecca Pugh NO.:  [id number]  MEDICAL RECORD NO.:  [id number]  LOCATION:  1609                         FACILITY:  [facility]  PHYSICIAN:  Ollen Gross, M.D.    DATE OF BIRTH:  11/24/1950  DATE OF PROCEDURE:  01/31/2012 DATE OF DISCHARGE:                              OPERATIVE REPORT   PREOPERATIVE DIAGNOSIS:  Right trimalleolar ankle fracture.  POSTOPERATIVE DIAGNOSIS:  Right trimalleolar ankle fracture.  PROCEDURE:  Open reduction and internal fixation of right trimalleolar ankle fracture.  SURGEON:  Ollen Gross, M.D.  ASSISTANT:  Dimitri Ped, PA-C.  ANESTHESIA:  General.  ESTIMATED BLOOD LOSS:  Minimal.  DRAINS:  None.  TOURNIQUET TIME:  45 minutes at 200 mmHg.  COMPLICATIONS:  None.  CONDITION:  Stable to recovery.  CLINICAL NOTE:  Rebecca Pugh is a 61 year old female, who had a fall in her yard earlier today sustaining a trimalleolar ankle fracture and dislocation.  She was seen at [facility], reduced and splinted. We have taken care of her in the past, and she was subsequent transferred to [facility] for operative management of this fracture.  PROCEDURE IN DETAIL:  After successful administration of general anesthetic, tourniquet was placed on the right thigh.  Right lower extremity was prepped and draped in usual sterile fashion.  Extremities was wrapped in Esmarch.  Tourniquet was inflated to 300 mmHg.  I started the incision laterally and  the skin was cut with #10 blade through subcutaneous tissue and dissected down to the lateral malleolus.  There was comminution at the fracture site.  Reduced fracture, but it would not come down to anatomic reduction.  I then made my incision medially which is a vertical incision over the medial malleolus down to the tip of the malleolus.  The malleolus was trapped in the joint.  I freed the malleolus up and then the lateral side reduced perfectly.  I held it with a clamp.   Due to the comminution we cannot place the lag screw plate.  I placed a 1/3rd tibial plate with 7 holes, and configured it to the lateral malleolus and placed 4 cortical screws proximal and 2 cancellous screws distal to the fracture.  It was very stable in anatomic reduction.  We then were able to reduce the medial malleolus. There was some comminution at the fracture site.  The comminuted fragment was placed back piecemeal into the fracture and we configured it back to normal configuration.  The Guidepins for the 4-0 cancellous cannulated screws were then passed with the tip of the malleolus across the fracture site and proximally into the tibia.  A 15-mm cancellous screws were passed over the Guidepin with a washer on the posterior screw and no washer anteriorly.  This led to excellent anatomic reduction of the malleolus.  On the lateral view, the posterior malleolus also reduced anatomically.  Multiple fluoro spots were taken and was very happy with the reduction.  The talus is now centered in the tibial plafond.  Wound was then copiously irrigated with saline solution.  Subcu was closed with interrupted 2-0 Vicryl.  Tourniquet was released in total time of 45 minutes.  Skin was closed with  staples. Incisions cleaned and dried, and bulky sterile dressing applied.  She was placed into a CAM walker.  Awakened and transferred to recovery in stable condition.  Please note the surgical assistant was a medical necessity for this procedure.  Surgical assistant was necessary to retract the vital neurovascular and tendinous structures while the fracture was reduced and hardware was placed.     Ollen Gross, M.D.     FA/MEDQ  D:  01/31/2012  T:  02/01/2012  Job:  [id number]

## 2012-02-01 NOTE — Progress Notes (Signed)
Report to susan rn

## 2012-02-01 NOTE — Evaluation (Signed)
Physical Therapy Evaluation Patient Details Name: Rebecca Pugh MRN: [id number] DOB: [birth year] Today's Date: 02/01/2012 Time: 4098-1191 PT Time Calculation (min): 22 min  PT Assessment / Plan / Recommendation Clinical Impression  61 y.o. female with R ankle fx, POD #1 for ORIF. Pt doing well for POD #1, she transfered bed to chair with min/guard assist. Good progress expected. HHPT and RW recommended. Noted h/o of L mastectomy and L calcaneous fx.     PT Assessment  Patient needs continued PT services    Follow Up Recommendations  Home health PT    Barriers to Discharge None      Equipment Recommendations  Rolling walker with 5" wheels    Recommendations for Other Services OT consult   Frequency Min 6X/week    Precautions / Restrictions Restrictions Weight Bearing Restrictions: Yes RLE Weight Bearing: Non weight bearing   Pertinent Vitals/Pain *4/10 R ankle RLE elevated, pt premedicated**      Mobility  Bed Mobility Bed Mobility: Supine to Sit Supine to Sit: 4: Min assist;HOB elevated;With rails Details for Bed Mobility Assistance: Min VCs for hand placement, sequencing and technique. Transfers Sit to Stand: 4: Min guard;With upper extremity assist;From bed;From elevated surface Stand to Sit: 4: Min guard;With upper extremity assist;To chair/3-in-1;With armrests Details for Transfer Assistance: Min VCs for hand placement.    Exercises     PT Diagnosis: Difficulty walking;Acute pain  PT Problem List: Pain;Decreased mobility;Decreased activity tolerance PT Treatment Interventions: DME instruction;Gait training;Stair training;Functional mobility training;Therapeutic exercise;Patient/family education   PT Goals Acute Rehab PT Goals PT Goal Formulation: With patient Time For Goal Achievement: 02/04/12 Potential to Achieve Goals: Good Pt will go Supine/Side to Sit: with modified independence;with rail;with HOB 0 degrees PT Goal: Supine/Side to Sit - Progress: Goal set  today Pt will go Sit to Stand: with supervision;with upper extremity assist PT Goal: Sit to Stand - Progress: Goal set today Pt will Ambulate: 16 - 50 feet;with supervision;with rolling walker PT Goal: Ambulate - Progress: Goal set today Pt will Go Up / Down Stairs: 3-5 stairs;with rolling walker PT Goal: Up/Down Stairs - Progress: Goal set today  Visit Information  Last PT Received On: 02/01/12 Assistance Needed: +1 PT/OT Co-Evaluation/Treatment: Yes    Subjective Data  Subjective: That wasn't so bad. (getting OOB) Patient Stated Goal: return to independence with mobility   Prior Functioning  Home Living Lives With: Spouse Available Help at Discharge: Available PRN/intermittently;Family Type of Home: House Home Access: Level entry Home Layout: Two level Alternate Level Stairs-Number of Steps: 24 -pt states she's been sitting to ascend/descend flight of stairs Alternate Level Stairs-Rails: Right;Left;Can reach both Bathroom Shower/Tub: Engineer, manufacturing systems: Standard Bathroom Accessibility: Yes How Accessible: Accessible via walker Home Adaptive Equipment: Shower chair without back Prior Function Level of Independence: Independent Able to Take Stairs?: Yes Driving: Yes Vocation: On disability Communication Communication: No difficulties Dominant Hand: Right    Cognition  Overall Cognitive Status: Appears within functional limits for tasks assessed/performed Arousal/Alertness: Awake/alert Orientation Level: Appears intact for tasks assessed Behavior During Session: [facility] for tasks performed    Extremity/Trunk Assessment Right Upper Extremity Assessment RUE ROM/Strength/Tone: [facility] for tasks assessed Left Upper Extremity Assessment LUE ROM/Strength/Tone: Deficits LUE ROM/Strength/Tone Deficits: 4-/5 grossly from L mastectomy Right Lower Extremity Assessment RLE ROM/Strength/Tone: Deficits;Unable to fully assess;Due to precautions;Due to pain (R foot in Cam  boot) RLE ROM/Strength/Tone Deficits: knee ext 3/5, hip flexion -3/5, ankle NT Left Lower Extremity Assessment LLE ROM/Strength/Tone: Within functional levels LLE Sensation: WFL -  Light Touch LLE Coordination: WFL - gross/fine motor Trunk Assessment Trunk Assessment: Normal   Balance Balance Balance Assessed: Yes Static Sitting Balance Static Sitting - Balance Support: Feet supported;No upper extremity supported Static Sitting - Level of Assistance: 7: Independent Static Sitting - Comment/# of Minutes: 4  End of Session PT - End of Session Equipment Utilized During Treatment: Gait belt (R cam boot) Activity Tolerance: Patient tolerated treatment well Patient left: in chair;with call bell/phone within reach Nurse Communication: Mobility status  GP     Ralene Bathe Kistler 02/01/2012, 11:33 AM  [phone]

## 2012-02-02 NOTE — Plan of Care (Signed)
Problem: Phase III Progression Outcomes Goal: Activity at appropriate level-compared to baseline (UP IN CHAIR FOR HEMODIALYSIS)  Outcome: Not Progressing Limited by pain

## 2012-02-02 NOTE — Anesthesia Postprocedure Evaluation (Signed)
  Anesthesia Post-op Note  Patient: Rebecca Pugh  Procedure(s) Performed: Procedure(s) (LRB): OPEN REDUCTION INTERNAL FIXATION (ORIF) ANKLE FRACTURE (Right)  Patient Location: PACU  Anesthesia Type: General  Level of Consciousness: awake and alert   Airway and Oxygen Therapy: Patient Spontanous Breathing  Post-op Pain: mild  Post-op Assessment: Post-op Vital signs reviewed, Patient's Cardiovascular Status Stable, Respiratory Function Stable, Patent Airway and No signs of Nausea or vomiting  Post-op Vital Signs: stable  Complications: No apparent anesthesia complications

## 2012-02-02 NOTE — Progress Notes (Signed)
Subjective:    2 Days Post-Op Procedure(s) (LRB): OPEN REDUCTION INTERNAL FIXATION (ORIF) ANKLE FRACTURE (Right) Patient reports pain as 5 on 0-10 scale.    Objective: Vital signs in last 24 hours: Temp:  [98.3 F (36.8 C)-99.5 F (37.5 C)] 98.3 F (36.8 C) (08/18 0515) Pulse Rate:  [62-80] 62  (08/18 0515) Resp:  [18-20] 20  (08/18 0515) BP: (118-135)/(68-79) 135/79 mmHg (08/18 0515) SpO2:  [96 %-98 %] 98 % (08/18 0515)  Intake/Output from previous day: 08/17 0701 - 08/18 0700 In: -  Out: 1125 [Urine:1125] Intake/Output this shift:    No results found for this basename: HGB:5 in the last 72 hours No results found for this basename: WBC:2,RBC:2,HCT:2,PLT:2 in the last 72 hours  Basename 02/01/12 0500  NA 136  K 4.5  CL 102  CO2 25  BUN 13  CREATININE 0.97  GLUCOSE 180*  CALCIUM 8.6   No results found for this basename: LABPT:2,INR:2 in the last 72 hours  Neurologically intact Neurovascular intact Sensation intact distally Compartment soft  Assessment/Plan: 2 Days Post-Op Procedure(s) (LRB): OPEN REDUCTION INTERNAL FIXATION (ORIF) ANKLE FRACTURE (Right) Up with therapy. Elevate leg more today. Was OOB a lot yesterday.  Elvie Maines C 02/02/2012, 7:39 AM

## 2012-02-02 NOTE — Progress Notes (Signed)
Patient within 5 minutes of getting back to bed from bathroom. C/O feeling hot and anxious. Placed cool cloth on patients forehead, turned room temp down.  VSS- 147/74 98.3 ax, 98%, 77P, 20 R. Explained to patient that increased fluids may help. Pt admitted that she had been drinking less so her trips to the bathroom would be less frequent. Patient verbalized that her anxiety level was decreasing.  Repositioned patient and told to call if complaints worsened. Will continue to monitor closely. Shalaine Payson [place], [place] 02/02/2012 8:56 PM

## 2012-02-02 NOTE — Progress Notes (Signed)
PT Cancellation Note  Treatment cancelled today due to medical issues with patient which prohibited therapy. Pt reports muscle spasms at present and doesn't feel she can participate in PT. RN notified. Will follow.  Ralene Bathe Kistler 02/02/2012, 1:07 PM

## 2012-02-02 NOTE — Progress Notes (Signed)
OT Note:  Pt feels she overdid it yesterday and has pain. States she was able to get herself dressed.  Will check back tomorrow for any further OT education needs.  [place], OTR/L [phone] 02/02/2012

## 2012-02-02 NOTE — Progress Notes (Signed)
Physical Therapy Treatment Patient Details Name: Rebecca Pugh MRN: [id number] DOB: March 12, 1951 Today's Date: 02/02/2012 Time: 1000-1010 PT Time Calculation (min): 10 min  PT Assessment / Plan / Recommendation Comments on Treatment Session  Activity tolerance limited by increased pain today. Bed to chair with RW and min A, gait deferred due to pain. Pt tolerated only minimal ther ex with RLE.     Follow Up Recommendations  Home health PT    Barriers to Discharge        Equipment Recommendations  Rolling walker with 5" wheels    Recommendations for Other Services OT consult  Frequency Min 6X/week   Plan Discharge plan remains appropriate;Frequency remains appropriate    Precautions / Restrictions Restrictions Weight Bearing Restrictions: Yes RLE Weight Bearing: Non weight bearing (no WB status in chart, awaiting WB orders)   Pertinent Vitals/Pain *7/10 R ankle, pt premedicated, RLE elevated**    Mobility  Bed Mobility Bed Mobility: Supine to Sit Supine to Sit: 4: Min assist;HOB elevated;With rails Details for Bed Mobility Assistance: min A to support RLE Transfers Transfers: Stand Pivot Transfers Sit to Stand: 4: Min guard;With upper extremity assist;From bed;From elevated surface Stand to Sit: 4: Min guard;With upper extremity assist;To chair/3-in-1;With armrests Stand Pivot Transfers: 4: Min guard Details for Transfer Assistance: with RW bed to recliner Ambulation/Gait Ambulation/Gait Assistance: Not tested (comment)    Exercises General Exercises - Lower Extremity Hip ABduction/ADduction: AAROM;Right;Supine;5 reps (tolerance limited by pain)   PT Diagnosis:    PT Problem List:   PT Treatment Interventions:     PT Goals Acute Rehab PT Goals PT Goal Formulation: With patient Time For Goal Achievement: 02/04/12 Potential to Achieve Goals: Good Pt will go Supine/Side to Sit: with modified independence;with rail;with HOB 0 degrees PT Goal: Supine/Side to Sit -  Progress: Progressing toward goal Pt will go Sit to Stand: with supervision;with upper extremity assist PT Goal: Sit to Stand - Progress: Progressing toward goal Pt will Ambulate: 16 - 50 feet;with supervision;with rolling walker PT Goal: Ambulate - Progress: Not progressing (limited by pain) Pt will Go Up / Down Stairs: 3-5 stairs;with rolling walker  Visit Information  Last PT Received On: 02/02/12 Assistance Needed: +1    Subjective Data  Subjective: I think I overdid it yesterday. I felt ok during the day but then had a lot of pain last night. I want to take it easier today. Patient Stated Goal: decrease pain   Cognition  Overall Cognitive Status: Appears within functional limits for tasks assessed/performed Arousal/Alertness: Awake/alert Orientation Level: Appears intact for tasks assessed Behavior During Session: [facility] for tasks performed    Balance  Balance Balance Assessed: Yes Static Sitting Balance Static Sitting - Balance Support: Feet supported;No upper extremity supported Static Sitting - Level of Assistance: 7: Independent Static Sitting - Comment/# of Minutes: 2  End of Session PT - End of Session Equipment Utilized During Treatment: Gait belt (R cam boot) Activity Tolerance: Patient limited by pain Patient left: in chair;with call bell/phone within reach Nurse Communication: Mobility status   GP     Ralene Bathe Kistler 02/02/2012, 10:16 AM [phone]

## 2012-02-03 ENCOUNTER — Telehealth: Payer: Self-pay | Admitting: Emergency Medicine

## 2012-02-03 ENCOUNTER — Other Ambulatory Visit: Payer: Self-pay | Admitting: Oncology

## 2012-02-03 ENCOUNTER — Telehealth: Payer: Self-pay | Admitting: *Deleted

## 2012-02-03 MED ORDER — METHOCARBAMOL 500 MG PO TABS: 500.0000 mg | ORAL_TABLET | Freq: Four times a day (QID) | ORAL | Status: AC | PRN

## 2012-02-03 MED ORDER — TRAMADOL HCL 50 MG PO TABS: 50.0000 mg | ORAL_TABLET | Freq: Four times a day (QID) | ORAL | Status: AC | PRN

## 2012-02-03 MED ORDER — ASPIRIN EC 325 MG PO TBEC: 325.0000 mg | DELAYED_RELEASE_TABLET | Freq: Every day | ORAL | Status: AC

## 2012-02-03 MED ORDER — OXYCODONE HCL 5 MG PO TABS: 5.0000 mg | ORAL_TABLET | ORAL | Status: AC | PRN

## 2012-02-03 NOTE — Progress Notes (Signed)
Physical Therapy Treatment Patient Details Name: Rebecca Pugh MRN: [id number] DOB: Sep 06, 1950 Today's Date: 02/03/2012 Time: 4098-1191 PT Time Calculation (min): 15 min  PT Assessment / Plan / Recommendation Comments on Treatment Session  Pt doing better today, however still limited by decreased activity tolerance.  Ready for D/C.     Follow Up Recommendations  Home health PT    Barriers to Discharge        Equipment Recommendations  Rolling walker with 5" wheels    Recommendations for Other Services    Frequency Min 6X/week   Plan Discharge plan remains appropriate;Frequency remains appropriate    Precautions / Restrictions Precautions Required Braces or Orthoses: Other Brace/Splint (Cam boot) Restrictions Weight Bearing Restrictions: Yes RLE Weight Bearing: Non weight bearing   Pertinent Vitals/Pain 4/10    Mobility  Bed Mobility Bed Mobility: Supine to Sit Supine to Sit: 4: Min guard Details for Bed Mobility Assistance: min/guard for RLE out of bed.  Pt demos good UE technique to self assist trunk/hips to EOB.  Transfers Sit to Stand: With upper extremity assist;From bed;From elevated surface;5: Supervision Stand to Sit: 5: Supervision;With upper extremity assist;With armrests;To chair/3-in-1 Details for Transfer Assistance: Min cues for hand placement and safety.  Ambulation/Gait Ambulation/Gait Assistance: 5: Supervision Ambulation Distance (Feet): 50 Feet Assistive device: Rolling walker Ambulation/Gait Assistance Details: Min cues for sequencing/technique with RW and for upright posture.  Pt had to take several standing rest breaks due to fatigue.  Gait Pattern: Step-to pattern Stairs:  (Pt states she will do "butt scooting" to do stairs)    Exercises General Exercises - Lower Extremity Hip ABduction/ADduction: AAROM;Right;10 reps Straight Leg Raises: AAROM;Right;10 reps   PT Diagnosis:    PT Problem List:   PT Treatment Interventions:     PT Goals Acute  Rehab PT Goals PT Goal Formulation: With patient Time For Goal Achievement: 02/04/12 Potential to Achieve Goals: Good Pt will go Supine/Side to Sit: with modified independence;with rail;with HOB 0 degrees PT Goal: Supine/Side to Sit - Progress: Progressing toward goal Pt will go Sit to Stand: with supervision;with upper extremity assist PT Goal: Sit to Stand - Progress: Met Pt will Ambulate: 16 - 50 feet;with supervision;with rolling walker PT Goal: Ambulate - Progress: Met Pt will Go Up / Down Stairs: 3-5 stairs;with rolling walker PT Goal: Up/Down Stairs - Progress: Partly met  Visit Information  Last PT Received On: 02/03/12 Assistance Needed: +1    Subjective Data  Subjective: My pain is much better today.  Patient Stated Goal: decrease pain   Cognition  Overall Cognitive Status: Appears within functional limits for tasks assessed/performed Arousal/Alertness: Awake/alert Orientation Level: Appears intact for tasks assessed Behavior During Session: [facility] for tasks performed    Balance     End of Session PT - End of Session Activity Tolerance: Patient limited by fatigue Patient left: in chair;with call bell/phone within reach Nurse Communication: Mobility status   Pugh     Rebecca Pugh, Rebecca Mattes 02/03/2012, 9:09 AM

## 2012-02-03 NOTE — Progress Notes (Signed)
Pt to d/c home. AVS reviewed. Pt capable of verbalizing medications and follow-up appointments. Remains hemodynamically stable. No signs and symptoms of distress. Educated pt to return to ER in the case of SOB, dizziness, or chest pain.   

## 2012-02-03 NOTE — Progress Notes (Addendum)
Occupational Therapy Treatment Patient Details Name: Rebecca Pugh MRN: [id number] DOB: 1950/08/09 Today's Date: 02/03/2012 Time: 4098-1191 OT Time Calculation (min): 13 min  OT Assessment / Plan / Recommendation Comments on Treatment Session Pt making steady progress towards goals. Pt did state she was given a BSC for over the toilet. Pt states she will also have assist for donning and doffing the CAM boot.    Follow Up Recommendations  No OT follow up    Barriers to Discharge       Equipment Recommendations  Rolling walker with 5" wheels    Recommendations for Other Services    Frequency Min 2X/week   Plan Discharge plan remains appropriate    Precautions / Restrictions Precautions Required Braces or Orthoses: Other Brace/Splint Other Brace/Splint: CAM boot Restrictions Weight Bearing Restrictions: Yes RLE Weight Bearing: Non weight bearing   Pertinent Vitals/Pain     ADL  Lower Body Dressing: Performed;Min guard Toilet Transfer: Performed;Supervision/safety Toilet Transfer Method: Sit to Barista: Raised toilet seat with arms (or 3-in-1 over toilet) Toileting - Clothing Manipulation and Hygiene: Performed;Supervision/safety Where Assessed - Toileting Clothing Manipulation and Hygiene: Sit to stand from 3-in-1 or toilet ADL Comments: Educated pt how to don/doff cam boot. Pt did require mod A.    OT Diagnosis:    OT Problem List:   OT Treatment Interventions:     OT Goals ADL Goals ADL Goal: Lower Body Dressing - Progress: Progressing toward goals Pt Will Transfer to Toilet: with modified independence ADL Goal: Toilet Transfer - Progress: Updated due to goal met Pt Will Perform Toileting - Clothing Manipulation: with modified independence ADL Goal: Toileting - Clothing Manipulation - Progress: Updated due to goal met Pt Will Perform Toileting - Hygiene: with modified independence;Sit to stand from 3-in-1/toilet ADL Goal: Toileting - Hygiene -  Progress: Updated due to goal met  Visit Information  Last OT Received On: 02/03/12 Assistance Needed: +1    Subjective Data  Subjective: They gave me a BSC for home.   Prior Functioning       Cognition  Overall Cognitive Status: Appears within functional limits for tasks assessed/performed Arousal/Alertness: Awake/alert Orientation Level: Appears intact for tasks assessed Behavior During Session: [facility] for tasks performed    Mobility  Transfers Sit to Stand: 5: Supervision;With upper extremity assist;From bed;From chair/3-in-1;With armrests Stand to Sit: 5: Supervision;With upper extremity assist;To bed Details for Transfer Assistance: Min VCs for hand placement and safety.   Exercises   Balance    End of Session OT - End of Session Activity Tolerance: Patient tolerated treatment well Patient left: in bed;with call bell/phone within reach  GO     Brandon Wiechman A OTR/L [phone] 02/03/2012, 11:23 AM

## 2012-02-03 NOTE — Progress Notes (Signed)
CARE MANAGEMENT NOTE 02/03/2012  Patient:  Rebecca Pugh, Rebecca Pugh   Account Number:  [id number]  Date Initiated:  02/01/2012  Documentation initiated by:  DAVIS,TYMEEKA  Subjective/Objective Assessment:   61 yo female admitted s/p OPEN REDUCTION INTERNAL FIXATION (ORIF) ANKLE FRACTURE (Right). PTA pt independently lived with spouse.     Action/Plan:   home when stable   Anticipated DC Date:  02/02/2012   Anticipated DC Plan:  HOME W HOME HEALTH SERVICES  In-house referral  NA      DC Planning Services  CM consult      PAC Choice  DURABLE MEDICAL EQUIPMENT  HOME HEALTH   Choice offered to / List presented to:  C-1 Patient   DME arranged  3-N-1  Levan Hurst      DME agency  [facility]     [facility] arranged  HH-1 RN      [facility] agency  [facility]   Status of service:  Completed, signed off Medicare Important Message given?  NA - LOS <3 / Initial given by admissions (If response is "NO", the following Medicare IM given date fields will be blank) Date Medicare IM given:   Date Additional Medicare IM given:    Discharge Disposition:  HOME W HOME HEALTH SERVICES  Per UR Regulation:  Reviewed for med. necessity/level of care/duration of stay  I Comments:  02/03/2012 [place], [place] BSN CCM [phone] DME has been delivered to patient's room. Plans for discharge today. [facility] notified of anticipated discharge.. HH services to start tomorrow.

## 2012-02-03 NOTE — Progress Notes (Signed)
Subjective: 3 Days Post-Op Procedure(s) (LRB): OPEN REDUCTION INTERNAL FIXATION (ORIF) ANKLE FRACTURE (Right) Patient reports pain as mild.   Feels much better today and is ready to go home  Objective: Vital signs in last 24 hours: Temp:  [98.5 F (36.9 C)-99.8 F (37.7 C)] 99.8 F (37.7 C) (08/19 0610) Pulse Rate:  [61-70] 70  (08/19 0610) Resp:  [18-20] 20  (08/19 0610) BP: (122-145)/(78-79) 145/79 mmHg (08/19 0610) SpO2:  [91 %-94 %] 91 % (08/19 0610)  Intake/Output from previous day: 08/18 0701 - 08/19 0700 In: 480 [P.O.:480] Out: 325 [Urine:325] Intake/Output this shift:    No results found for this basename: HGB:5 in the last 72 hours No results found for this basename: WBC:2,RBC:2,HCT:2,PLT:2 in the last 72 hours  Basename 02/01/12 0500  NA 136  K 4.5  CL 102  CO2 25  BUN 13  CREATININE 0.97  GLUCOSE 180*  CALCIUM 8.6   No results found for this basename: LABPT:2,INR:2 in the last 72 hours  Neurologically intact Sensation intact distally Intact pulses distally Dorsiflexion/Plantar flexion intact Incision: dressing C/D/I No cellulitis present Compartment soft  Assessment/Plan: 3 Days Post-Op Procedure(s) (LRB): OPEN REDUCTION INTERNAL FIXATION (ORIF) ANKLE FRACTURE (Right) Up with therapy D/C IV fluids Discharge home with home health Aspirin for DVT prophylaxis  [place] Huberty V 02/03/2012, 7:15 AM

## 2012-02-03 NOTE — Telephone Encounter (Signed)
Patient called to inform us that she is in the hospital due to a recent emergency surgery. She is concerned about her appointments and chemo scheduled for this week. Dr Darnelle Catalan aware and will call patient with further directions.

## 2012-02-03 NOTE — Telephone Encounter (Signed)
Per staff message I have scheduled appt. JMW 

## 2012-02-03 NOTE — Telephone Encounter (Signed)
per orders moved from 02-05-2012 to 02-12-2012 left voice message to inform the patient sent michelle email to move the patient treatment to 02-12-2012

## 2012-02-05 ENCOUNTER — Other Ambulatory Visit: Payer: PRIVATE HEALTH INSURANCE | Admitting: Lab

## 2012-02-05 ENCOUNTER — Ambulatory Visit: Payer: PRIVATE HEALTH INSURANCE

## 2012-02-05 ENCOUNTER — Encounter (HOSPITAL_COMMUNITY): Payer: Self-pay | Admitting: Orthopedic Surgery

## 2012-02-10 NOTE — Discharge Summary (Signed)
Physician Discharge Summary   Patient ID: Tinea Nobile MRN: [id number] DOB/AGE: Jan 24, 1951 61 y.o.  Admit date: 01/31/2012 Discharge date: 02/03/2012  Primary Diagnosis: Right trimalleolar ankle fracture   Admission Diagnoses:  Past Medical History  Diagnosis Date  . Hypertension   . Depression   . breast ca dx'd 01/15/2006    chemo/xrt comp   Discharge Diagnoses:   Principal Problem:  *Fracture of ankle, trimalleolar, closed  Procedure: Procedure(s) (LRB): OPEN REDUCTION INTERNAL FIXATION (ORIF) ANKLE FRACTURE (Right)   Consults: None  HPI: Rebecca Pugh is a 61 year old female, who had a fall in  her yard earlier today sustaining a trimalleolar ankle fracture and  dislocation. She was seen at [facility], reduced and splinted.  We have taken care of her in the past, and she was subsequent  transferred to [facility] for operative management of this  fracture.      Laboratory Data: Appointment on 01/08/2012  Component Date Value Range Status  . WBC 01/08/2012 4.5  3.9 - 10.3 10e3/uL Final  . NEUT# 01/08/2012 2.6  1.5 - 6.5 10e3/uL Final  . HGB 01/08/2012 12.1  11.6 - 15.9 g/dL Final  . HCT 69/62/9528 35.8  34.8 - 46.6 % Final  . Platelets 01/08/2012 224  145 - 400 10e3/uL Final  . MCV 01/08/2012 87.7  79.5 - 101.0 fL Final  . MCH 01/08/2012 29.7  25.1 - 34.0 pg Final  . MCHC 01/08/2012 33.8  31.5 - 36.0 g/dL Final  . RBC 41/32/4401 4.08  3.70 - 5.45 10e6/uL Final  . RDW 01/08/2012 13.9  11.2 - 14.5 % Final  . lymph# 01/08/2012 1.3  0.9 - 3.3 10e3/uL Final  . MONO# 01/08/2012 0.3  0.1 - 0.9 10e3/uL Final  . Eosinophils Absolute 01/08/2012 0.2  0.0 - 0.5 10e3/uL Final  . Basophils Absolute 01/08/2012 0.1  0.0 - 0.1 10e3/uL Final  . NEUT% 01/08/2012 57.5  38.4 - 76.8 % Final  . LYMPH% 01/08/2012 29.1  14.0 - 49.7 % Final  . MONO% 01/08/2012 7.3  0.0 - 14.0 % Final  . EOS% 01/08/2012 4.6  0.0 - 7.0 % Final  . BASO% 01/08/2012 1.5  0.0 - 2.0 % Final    . nRBC 01/08/2012 0  0 - 0 % Final  . Sodium 01/08/2012 139  135 - 145 mEq/L Final  . Potassium 01/08/2012 4.5  3.5 - 5.3 mEq/L Final  . Chloride 01/08/2012 104  96 - 112 mEq/L Final  . CO2 01/08/2012 25  19 - 32 mEq/L Final  . Glucose, Bld 01/08/2012 100* 70 - 99 mg/dL Final  . BUN 02/72/5366 14  6 - 23 mg/dL Final  . Creatinine, Ser 01/08/2012 0.91  0.50 - 1.10 mg/dL Final  . Total Bilirubin 01/08/2012 0.9  0.3 - 1.2 mg/dL Final  . Alkaline Phosphatase 01/08/2012 88  39 - 117 U/L Final  . AST 01/08/2012 24  0 - 37 U/L Final  . ALT 01/08/2012 27  0 - 35 U/L Final  . Total Protein 01/08/2012 6.7  6.0 - 8.3 g/dL Final  . Albumin 44/08/4740 4.2  3.5 - 5.2 g/dL Final  . Calcium 59/56/3875 9.4  8.4 - 10.5 mg/dL Final  . CA 64.33 29/51/8841 34  0 - 39 U/mL Final  . CellSearch(R ) 01/08/2012 REPORT   Final   Comment: CELLSEARCH(R) CIRCULATING TUMOR CELLS, BREASTTESTS:       CellSearch(R) Circ. Tumor Cells, Breast (1)----------FINAL INTERPRETATION----------  RESULT     UNITSCTCs detected by [facility]       11        CTC/7.5 mL whole                           bloodCirculating tumor cells (CTCs) were detected by the CellSearch method.Reference range: See belowCTCs counts greater than 5 have been reported to  predict shorterprogression-free survival (PFS) and overall (OS) in metastatic breastcancer.  The table                           below lists median PFS and OS based on CTCs countsat baseline and at follow-up.        Number of CTCs                PFS (months)    OS (months)Breast:At all time points <5                7.2             22.6Baseline <5; at last draw >=5        5.9                                       10.6Baseline >= 5; at last draw <5       6.1             19.8At all time points >=5 1.8              4.1References: Veridex product insert, [id number], Rev 3 (August 21, 2006).Methodology: The CellSearch Circulating Tumor Cell Kit is aFDA-cleared                            immunocytologic method intended for the enumeration ofCTCs of epithelial origin in whole blood.  Images of the identifiedCTCs are reviewed by a pathologist prior to sign-out.Resulted by RAFAEL A. PEREZ, M.D.Electronic Signature on [phone]  . Results Received 01/08/2012 01/11/12   Final   Reference lab accession: [id number] EC   No results found for this basename: HGB:5 in the last 72 hours No results found for this basename: WBC:2,RBC:2,HCT:2,PLT:2 in the last 72 hours No results found for this basename: NA:2,K:2,CL:2,CO2:2,BUN:2,CREATININE:2,GLUCOSE:2,CALCIUM:2 in the last 72 hours No results found for this basename: LABPT:2,INR:2 in the last 72 hours  X-Rays:Dg Ankle Complete Right  01/31/2012  *RADIOLOGY REPORT*  Clinical Data: Ankle fracture.  RIGHT ANKLE - COMPLETE 3+ VIEW  Comparison: Same day.  Findings: These images demonstrate internal fixation of distal fibular fracture as well as fixation of medial malleolus.  Good alignment of fracture components is noted.  IMPRESSION: Status post internal fixation of bilateral malleolar fractures.  Original Report Authenticated By: Venita Sheffield., M.D.   Dg C-arm 1-60 Min-no Report  01/31/2012  CLINICAL DATA: fractured ankle   C-ARM 1-60 MINUTES  Fluoroscopy was utilized by the requesting physician.  No radiographic  interpretation.      EKG:No orders found for this or any previous visit.   Hospital Course: Rebecca Pugh is a 61 year old female, who had a fall in her yard earlier on the day of admission sustaining a trimalleolar ankle fracture and dislocation. She was seen at [facility], reduced and splinted. We have taken care of her in the past, and she  was subsequent transferred to [facility] for operative management of this fracture.  After successful administration of general anesthetic, she underwent the surgery well.  Transferred to PACU and then to the floor for postop care.  She was given PO and IV meds for  postop pain. On day one, OOB today with therapy.   DC foley,O2, heplock IV.  On day two, she reported pain as 5 on 0-10 scale.  Up with therapy. Elevate leg more today. Was OOB a lot the day before. By day three, patient reported pain as mild.  Felt much better today and was ready to go home  Discharge Medications: Prior to Admission medications   Medication Sig Start Date End Date Taking? Authorizing Provider  calcium citrate (CALCITRATE - DOSED IN MG ELEMENTAL CALCIUM) 950 MG tablet Take 1 tablet by mouth daily.     Yes Historical Provider, MD  cholecalciferol (VITAMIN D) 1000 UNITS tablet Take 3,000 Units by mouth daily.     Yes Historical Provider, MD  FLUoxetine (PROZAC) 20 MG capsule Take 1 capsule (20 mg total) by mouth daily. 12/11/11 12/10/12 Yes [name] Allegra Grana, PA  lapatinib (TYKERB) 250 MG tablet Take 750 mg by mouth daily.    Yes Historical Provider, MD  loperamide (IMODIUM) 2 MG capsule Take 2 mg by mouth 4 (four) times daily as needed. diarrhea   Yes Historical Provider, MD  LORazepam (ATIVAN) 0.5 MG tablet Take 1 tablet (0.5 mg total) by mouth 2 (two) times daily as needed. 08/14/11  Yes [name] Allegra Grana, PA  losartan (COZAAR) 50 MG tablet Take 1 tablet (50 mg total) by mouth daily. 07/10/11 07/09/12 Yes Hadassah Pais, PA  potassium chloride (KLOR-CON) 20 MEQ packet Take 20 mEq by mouth daily.    Yes Lowella Dell, MD  aspirin EC 325 MG tablet Take 1 tablet (325 mg total) by mouth daily. 02/03/12 02/13/12  Loanne Drilling, MD  methocarbamol (ROBAXIN) 500 MG tablet Take 1 tablet (500 mg total) by mouth every 6 (six) hours as needed. 02/03/12 02/13/12  Loanne Drilling, MD  oxyCODONE (OXY IR/ROXICODONE) 5 MG immediate release tablet Take 1-2 tablets (5-10 mg total) by mouth every 3 (three) hours as needed. 02/03/12 02/13/12  Loanne Drilling, MD  traMADol (ULTRAM) 50 MG tablet Take 1-2 tablets (50-100 mg total) by mouth every 6 (six) hours as needed. 02/03/12 02/13/12  Loanne Drilling, MD    Diet:  Regular diet Activity:TDWB Follow-up:in 2 weeks Disposition - Home Discharged Condition: fair   Discharge Orders    Future Appointments: Provider: Department: Dept Phone: Center:   02/12/2012 9:15 AM Beverely Pace Shumate [facility] [phone] None   02/12/2012 9:45 AM [facility] [phone] None   03/02/2012 9:30 AM Windell Hummingbird [facility] [phone] None   03/02/2012 10:00 AM Lowella Dell, MD [facility] [phone] None   03/02/2012 11:00 AM [facility] [phone] None   03/05/2012 11:40 AM [facility] [phone] [facility]     Future Orders Please Complete By Expires   Diet - low sodium heart healthy      Call MD / Call 911      Comments:   If you experience chest pain or shortness of breath, CALL 911 and be transported to the hospital emergency room.  If you develope a fever above 101 F, pus (white drainage) or increased drainage or redness at the wound, or calf pain, call your surgeon's office.  Constipation Prevention      Comments:   Drink plenty of fluids.  Prune juice may be helpful.  You may use a stool softener, such as Colace (over the counter) 100 mg twice a day.  Use MiraLax (over the counter) for constipation as needed.   Increase activity slowly as tolerated      Discharge instructions      Comments:   Keep your boot on at all times. May loosen it twice a day to move your toes and ankle  Take a 325 mg Aspirin daily for the next 6 weeks     Medication List  As of 02/10/2012 10:35 AM   TAKE these medications         aspirin EC 325 MG tablet   Take 1 tablet (325 mg total) by mouth daily.      calcium citrate 950 MG tablet   Commonly known as: CALCITRATE - dosed in mg elemental calcium   Take 1 tablet by mouth daily.      cholecalciferol 1000 UNITS tablet   Commonly known as: VITAMIN D   Take 3,000 Units by mouth daily.      FLUoxetine 20 MG capsule   Commonly  known as: PROZAC   Take 1 capsule (20 mg total) by mouth daily.      lapatinib 250 MG tablet   Commonly known as: TYKERB   Take 750 mg by mouth daily.      loperamide 2 MG capsule   Commonly known as: IMODIUM   Take 2 mg by mouth 4 (four) times daily as needed. diarrhea      LORazepam 0.5 MG tablet   Commonly known as: ATIVAN   Take 1 tablet (0.5 mg total) by mouth 2 (two) times daily as needed.      losartan 50 MG tablet   Commonly known as: COZAAR   Take 1 tablet (50 mg total) by mouth daily.      methocarbamol 500 MG tablet   Commonly known as: ROBAXIN   Take 1 tablet (500 mg total) by mouth every 6 (six) hours as needed.      oxyCODONE 5 MG immediate release tablet   Commonly known as: Oxy IR/ROXICODONE   Take 1-2 tablets (5-10 mg total) by mouth every 3 (three) hours as needed.      potassium chloride 20 MEQ packet   Commonly known as: KLOR-CON   Take 20 mEq by mouth daily.      traMADol 50 MG tablet   Commonly known as: ULTRAM   Take 1-2 tablets (50-100 mg total) by mouth every 6 (six) hours as needed.           Follow-up Information    Follow up with Loanne Drilling, MD. Schedule an appointment as soon as possible for a visit in 2 weeks. (Call tomorrow to make the appointment)    Contact information:   [facility] [address], [address] [place] [address] [phone]          Signed: Patrica Duel 02/10/2012, 10:35 AM

## 2012-02-12 ENCOUNTER — Other Ambulatory Visit: Payer: Self-pay | Admitting: Physician Assistant

## 2012-02-12 ENCOUNTER — Other Ambulatory Visit (HOSPITAL_BASED_OUTPATIENT_CLINIC_OR_DEPARTMENT_OTHER): Payer: Medicare Other | Admitting: Lab

## 2012-02-12 ENCOUNTER — Ambulatory Visit (HOSPITAL_BASED_OUTPATIENT_CLINIC_OR_DEPARTMENT_OTHER): Payer: Medicare Other

## 2012-02-12 DIAGNOSIS — C7970 Secondary malignant neoplasm of unspecified adrenal gland: Secondary | ICD-10-CM

## 2012-02-12 DIAGNOSIS — Z5112 Encounter for antineoplastic immunotherapy: Secondary | ICD-10-CM

## 2012-02-12 DIAGNOSIS — C50919 Malignant neoplasm of unspecified site of unspecified female breast: Secondary | ICD-10-CM

## 2012-02-12 DIAGNOSIS — C773 Secondary and unspecified malignant neoplasm of axilla and upper limb lymph nodes: Secondary | ICD-10-CM

## 2012-02-12 LAB — CANCER ANTIGEN 27.29: CA 27.29: 41 U/mL — ABNORMAL HIGH (ref 0–39)

## 2012-02-12 LAB — CBC WITH DIFFERENTIAL/PLATELET
BASO%: 0.8 % (ref 0.0–2.0)
Basophils Absolute: 0.1 10*3/uL (ref 0.0–0.1)
EOS%: 4.3 % (ref 0.0–7.0)
HGB: 11 g/dL — ABNORMAL LOW (ref 11.6–15.9)
MCH: 29.4 pg (ref 25.1–34.0)
RBC: 3.74 10*6/uL (ref 3.70–5.45)
RDW: 13.4 % (ref 11.2–14.5)
lymph#: 1.4 10*3/uL (ref 0.9–3.3)
nRBC: 0 % (ref 0–0)

## 2012-02-12 LAB — COMPREHENSIVE METABOLIC PANEL (CC13)
ALT: 29 U/L (ref 0–55)
AST: 14 U/L (ref 5–34)
Alkaline Phosphatase: 117 U/L (ref 40–150)
Sodium: 136 mEq/L (ref 136–145)
Total Bilirubin: 0.7 mg/dL (ref 0.20–1.20)
Total Protein: 6.3 g/dL — ABNORMAL LOW (ref 6.4–8.3)

## 2012-02-12 MED ORDER — DIPHENHYDRAMINE HCL 25 MG PO CAPS: 25.0000 mg | ORAL_CAPSULE | Freq: Once | ORAL | Status: DC

## 2012-02-12 MED ORDER — FULVESTRANT 250 MG/5ML IM SOLN
500.0000 mg | INTRAMUSCULAR | Status: DC
  Administered 2012-02-12: 500 mg via INTRAMUSCULAR
  Filled 2012-02-12: qty 10

## 2012-02-12 MED ORDER — TRASTUZUMAB CHEMO INJECTION 440 MG
6.0000 mg/kg | Freq: Once | INTRAVENOUS | Status: AC
  Administered 2012-02-12: 504 mg via INTRAVENOUS
  Filled 2012-02-12: qty 24

## 2012-02-12 MED ORDER — SODIUM CHLORIDE 0.9 % IV SOLN
Freq: Once | INTRAVENOUS | Status: AC
  Administered 2012-02-12: 10:00:00 via INTRAVENOUS

## 2012-02-12 MED ORDER — ACETAMINOPHEN 325 MG PO TABS
650.0000 mg | ORAL_TABLET | Freq: Once | ORAL | Status: AC
  Administered 2012-02-12: 650 mg via ORAL

## 2012-02-12 MED ORDER — HEPARIN SOD (PORK) LOCK FLUSH 100 UNIT/ML IV SOLN
500.0000 [IU] | Freq: Once | INTRAVENOUS | Status: AC | PRN
  Administered 2012-02-12: 500 [IU]
  Filled 2012-02-12: qty 5

## 2012-02-12 MED ORDER — SODIUM CHLORIDE 0.9 % IJ SOLN
10.0000 mL | INTRAMUSCULAR | Status: DC | PRN
  Administered 2012-02-12: 10 mL
  Filled 2012-02-12: qty 10

## 2012-02-12 NOTE — Patient Instructions (Addendum)
[facility] Discharge Instructions for Patients Receiving Chemotherapy  Today you received the following chemotherapy agents Herceptin  To help prevent nausea and vomiting after your treatment, we encourage you to take your nausea medication Begin taking it at 7 pm and take it as often as prescribed for the next 24 to 72 hours.   If you develop nausea and vomiting that is not controlled by your nausea medication, call the clinic. If it is after clinic hours your family physician or the after hours number for the clinic or go to the Emergency Department.   BELOW ARE SYMPTOMS THAT SHOULD BE REPORTED IMMEDIATELY:  *FEVER GREATER THAN 100.5 F  *CHILLS WITH OR WITHOUT FEVER  NAUSEA AND VOMITING THAT IS NOT CONTROLLED WITH YOUR NAUSEA MEDICATION  *UNUSUAL SHORTNESS OF BREATH  *UNUSUAL BRUISING OR BLEEDING  TENDERNESS IN MOUTH AND THROAT WITH OR WITHOUT PRESENCE OF ULCERS  *URINARY PROBLEMS  *BOWEL PROBLEMS  UNUSUAL RASH Items with * indicate a potential emergency and should be followed up as soon as possible.  One of the nurses will contact you 24 hours after your treatment. Please let the nurse know about any problems that you may have experienced. Feel free to call the clinic you have any questions or concerns. The clinic phone number is [phone].   I have been informed and understand all the instructions given to me. I know to contact the clinic, my physician, or go to the Emergency Department if any problems should occur. I do not have any questions at this time, but understand that I may call the clinic during office hours   should I have any questions or need assistance in obtaining follow up care.    __________________________________________  _____________  __________ Signature of Patient or Authorized Representative            Date                   Time    __________________________________________ Nurse's Signature    

## 2012-02-13 ENCOUNTER — Ambulatory Visit: Payer: Self-pay

## 2012-03-02 ENCOUNTER — Other Ambulatory Visit (HOSPITAL_BASED_OUTPATIENT_CLINIC_OR_DEPARTMENT_OTHER): Payer: Medicare Other | Admitting: Lab

## 2012-03-02 ENCOUNTER — Ambulatory Visit (HOSPITAL_BASED_OUTPATIENT_CLINIC_OR_DEPARTMENT_OTHER): Payer: Medicare Other

## 2012-03-02 ENCOUNTER — Telehealth: Payer: Self-pay | Admitting: *Deleted

## 2012-03-02 ENCOUNTER — Ambulatory Visit (HOSPITAL_BASED_OUTPATIENT_CLINIC_OR_DEPARTMENT_OTHER): Payer: Medicare Other | Admitting: Oncology

## 2012-03-02 ENCOUNTER — Other Ambulatory Visit: Payer: Medicare Other | Admitting: Lab

## 2012-03-02 VITALS — BP 153/68 | HR 84 | Temp 98.6°F | Resp 20 | Ht 64.0 in | Wt 182.4 lb

## 2012-03-02 DIAGNOSIS — C50919 Malignant neoplasm of unspecified site of unspecified female breast: Secondary | ICD-10-CM

## 2012-03-02 DIAGNOSIS — C773 Secondary and unspecified malignant neoplasm of axilla and upper limb lymph nodes: Secondary | ICD-10-CM

## 2012-03-02 DIAGNOSIS — C7970 Secondary malignant neoplasm of unspecified adrenal gland: Secondary | ICD-10-CM

## 2012-03-02 DIAGNOSIS — Z5112 Encounter for antineoplastic immunotherapy: Secondary | ICD-10-CM

## 2012-03-02 LAB — CBC WITH DIFFERENTIAL/PLATELET
BASO%: 0.8 % (ref 0.0–2.0)
LYMPH%: 24.6 % (ref 14.0–49.7)
MCHC: 33.4 g/dL (ref 31.5–36.0)
MCV: 87.4 fL (ref 79.5–101.0)
MONO%: 5.1 % (ref 0.0–14.0)
NEUT%: 64.8 % (ref 38.4–76.8)
Platelets: 232 10*3/uL (ref 145–400)
RBC: 3.73 10*6/uL (ref 3.70–5.45)

## 2012-03-02 LAB — COMPREHENSIVE METABOLIC PANEL (CC13)
ALT: 20 U/L (ref 0–55)
AST: 16 U/L (ref 5–34)
BUN: 17 mg/dL (ref 7.0–26.0)
CO2: 25 mEq/L (ref 22–29)
Creatinine: 0.9 mg/dL (ref 0.6–1.1)
Total Bilirubin: 1.2 mg/dL (ref 0.20–1.20)

## 2012-03-02 MED ORDER — ACETAMINOPHEN 325 MG PO TABS
650.0000 mg | ORAL_TABLET | Freq: Once | ORAL | Status: AC
  Administered 2012-03-02: 650 mg via ORAL

## 2012-03-02 MED ORDER — SODIUM CHLORIDE 0.9 % IV SOLN
Freq: Once | INTRAVENOUS | Status: AC
  Administered 2012-03-02: 11:00:00 via INTRAVENOUS

## 2012-03-02 MED ORDER — SODIUM CHLORIDE 0.9 % IJ SOLN
10.0000 mL | INTRAMUSCULAR | Status: DC | PRN
  Administered 2012-03-02: 10 mL
  Filled 2012-03-02: qty 10

## 2012-03-02 MED ORDER — HEPARIN SOD (PORK) LOCK FLUSH 100 UNIT/ML IV SOLN
500.0000 [IU] | Freq: Once | INTRAVENOUS | Status: AC | PRN
  Administered 2012-03-02: 500 [IU]
  Filled 2012-03-02: qty 5

## 2012-03-02 MED ORDER — TRASTUZUMAB CHEMO INJECTION 440 MG
8.0000 mg/kg | Freq: Once | INTRAVENOUS | Status: AC
  Administered 2012-03-02: 672 mg via INTRAVENOUS
  Filled 2012-03-02: qty 32

## 2012-03-02 NOTE — Patient Instructions (Addendum)
[facility] Discharge Instructions for Patients Receiving Chemotherapy  Today you received the following chemotherapy agents herceptin   If you develop nausea and vomiting that is not controlled by your nausea medication, call the clinic. If it is after clinic hours your family physician or the after hours number for the clinic or go to the Emergency Department.   BELOW ARE SYMPTOMS THAT SHOULD BE REPORTED IMMEDIATELY:  *FEVER GREATER THAN 100.5 F  *CHILLS WITH OR WITHOUT FEVER  NAUSEA AND VOMITING THAT IS NOT CONTROLLED WITH YOUR NAUSEA MEDICATION  *UNUSUAL SHORTNESS OF BREATH  *UNUSUAL BRUISING OR BLEEDING  TENDERNESS IN MOUTH AND THROAT WITH OR WITHOUT PRESENCE OF ULCERS  *URINARY PROBLEMS  *BOWEL PROBLEMS  UNUSUAL RASH Items with * indicate a potential emergency and should be followed up as soon as possible.  One of the nurses will contact you 24 hours after your treatment. Please let the nurse know about any problems that you may have experienced. Feel free to call the clinic you have any questions or concerns. The clinic phone number is [phone].   I have been informed and understand all the instructions given to me. I know to contact the clinic, my physician, or go to the Emergency Department if any problems should occur. I do not have any questions at this time, but understand that I may call the clinic during office hours   should I have any questions or need assistance in obtaining follow up care.    __________________________________________  _____________  __________ Signature of Patient or Authorized Representative            Date                   Time    __________________________________________ Nurse's Signature    

## 2012-03-02 NOTE — Telephone Encounter (Signed)
Per staff message and POF i have scheduled appts.  JMW  

## 2012-03-02 NOTE — Telephone Encounter (Signed)
herceptin today and every 28 days x 6; see berry nov 11; labs every treatment; fulvestrant 9/23 (one time only)  [facility] email to set up patient's treatment

## 2012-03-02 NOTE — Progress Notes (Signed)
ID: Rebecca Pugh   DOB: 1950/07/08  MR#: [id number]  JXB#:[id number]  HISTORY OF PRESENT ILLNESS: The patient and her husband, Rebecca Pugh, moved back to this area recently from [facility] (Rebecca Pugh actually is a native of [place] and incidentally remains a [employer] fan).  As part of moving boxes, Rebecca Pugh felt something under her left arm.  She brought it to Dr. Lovena Pugh attention and although the patient states that she has always had some cyclical swelling of her left axilla during menstruation, Dr. Lalla Pugh set the patient up for evaluation at [facility].  On 12-12-05 the patient had a mammogram and ultrasound there and these showed no mass, distortion or microcalcifications in either breast however, in the left axilla there was a large macrolobulated mass and just above that there was a slightly prominent lymph node which did demonstrate a fatty hilum.  Physically on exam the mass was mobile and nontender and measured approximately 6.5 cm.  The ultrasound showed two small hypoechoic nodules in the left breast 3 cm. from the nipple measuring 7 mm. each and felt to be most likely benign however, the axillary mass, of course, was quite suspicious and biopsy was performed the same day.  This showed ([id number] and [id number]) invasive breast cancer which was strongly ER positive at 76%, moderately PR positive at 25% with a very high proliferation marker at 66%.  HER-2/neu was 1+ and FISH showed no amplification with a ratio of 1.1.    With this information, the patient was referred to Dr. Claud Pugh and on 12-20-05 the patient had bilateral breast MRI's.  In the left breast there was a 5.8 cm. axillary mass and several abnormally enlarged lymph nodes.  There was a 7 mm. left supraclavicular lymph node and the two nodules in the breast that had been noted by ultrasound were also noted by MRI measuring 1.1 and 0.7 cm.  In the right breast there were no abnormalities noted.  MR guided biopsies of the left breast  masses were obtained on 12-27-05 and both showed high grade ductal carcinoma.  There was evidence of lymphovascular invasion and high grade ductal carcinoma in situ as well ([id number]).    Rebecca Pugh discussed the situation with me and we felt it would be useful to have a PET scan prior to definitive surgery.  This was obtained on 12-30-05 and showed the primary axillary mass to have an SUV of 8.5.  The lymph nodes in the left axilla had SUV's in the 2.2 range, small focus of increased activity in the left breast had an SUV of 2.3.  There was also skin thickening with mildly increased diffuse FDG uptake within the skin suggesting an inflammatory breast cancer.  The neck, abdomen and  pelvis were negative.  In the lung windows, there were several scattered small areas of focal nodular density in the upper lobes with compressive atelectasis and this was felt to be benign.  Chest x-ray on 01-09-06 was negative.  With this information, Rebecca Pugh proceeded, after appropriate discussion, to left modified radical mastectomy on 01-15-06 and the insertion of a BARD port at the same time.  The final pathology ([id number]) showed the maximum tumor size to be 5 cm.  Margins were ample.  There was evidence of lymphovascular invasion.  This infiltrating ductal carcinoma was a grade 3 of 3 and 11 out of 14 lymph nodes were involved.  There was evidence of extracapsular extension.    The patient received 4 cycles of adjuvant  paclitaxel/carboplatin/trastuzumab which was completed in November 2007 and was followed by postmastectomy radiation. Trastuzumab was continued for one year. Radiation was completed in February 2008 at which time anastrozole was started. 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 Additional treatment plan is as detailed below.  INTERVAL HISTORY: Rebecca Pugh returns today  for followup of her metastatic breast carcinoma. Since the last visit here she had her right ankle surgery. She has been wearing a boot on the right, and cannot yet their weight on her right leg.  REVIEW OF SYSTEMS: She tells me her last fulvestrant shot was extremely painful, and later in bed for about 2 days. She thinks this is due to the fact that she had the ankle surgery and has to wear the boot. She tells me that when Rebecca Pugh gives her the shot it doesn't hurt, and that has unfortunately is not here today. Aside from these issues, she is somewhat anxious and depressed, but doing quite well overall otherwise and planning a trip to let us they guess with her husband where they will be of quite busy with some business issues. A detailed review of systems today was otherwise entirely noncontributory.  PAST MEDICAL HISTORY: Past Medical History  Diagnosis Date  . Hypertension   . Depression   . breast ca dx'd 01/15/2006    chemo/xrt comp  remote tonsillectomy, remote cholecystectomy, status post appendectomy, multiple skin biopsies for what the patient said were simple keratoses, and status post port placement.  FAMILY HISTORY: The patient has no information regarding her father. She was brought up by her stepfather. The patient's mother died at the age of 61 with bladder cancer. The patient has one sister who is in good health. There is no history of breast or ovarian cancer in the family to the patient's knowledge.   GYNECOLOGIC HISTORY: She is GX, P2. She was premenopausal at the time of her initial breast cancer diagnosis  SOCIAL HISTORY: She and her husband, Rebecca Pugh, have been married 10 years. 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 She has two children, a daughter, Rebecca Pugh, who lives in [place] and is a Investment banker, corporate, and a son, Rebecca Pugh, also in [place], who works for [employer]. She has two  grandchildren and three step-grandchildren from her own two children, [name] and Rebecca Pugh, who are from her first marriage. Rebecca Pugh has one daughter from his first marriage and he has grandchildren through her. The patient was brought up as a Catholic but currently is not practicing. She and Rebecca Pugh are attending a [employer].      ADVANCED DIRECTIVES:  HEALTH MAINTENANCE: History  Substance Use Topics  . Smoking status: Former Smoker -- 20 years    Quit date: 01/31/1995  . Smokeless tobacco: Never Used  . Alcohol Use: Yes     occasionl     Colonoscopy:  PAP:  Bone density: August 2012, "Normal"  Lipid panel:  No Known Allergies  Current Outpatient Prescriptions  Medication Sig Dispense Refill  . calcium citrate (CALCITRATE - DOSED IN MG ELEMENTAL CALCIUM) 950 MG tablet Take 1 tablet by mouth daily.        . cholecalciferol (VITAMIN D) 1000 UNITS tablet Take 3,000 Units by mouth daily.        Marland Kitchen FLUoxetine (PROZAC) 20 MG capsule Take 1  capsule (20 mg total) by mouth daily.  30 capsule  11  . lapatinib (TYKERB) 250 MG tablet Take 750 mg by mouth daily.       Marland Kitchen loperamide (IMODIUM) 2 MG capsule Take 2 mg by mouth 4 (four) times daily as needed. diarrhea      . LORazepam (ATIVAN) 0.5 MG tablet Take 1 tablet (0.5 mg total) by mouth 2 (two) times daily as needed.  30 tablet  0  . losartan (COZAAR) 50 MG tablet Take 1 tablet (50 mg total) by mouth daily.  30 tablet  6  . potassium chloride (KLOR-CON) 20 MEQ packet Take 20 mEq by mouth daily.          OBJECTIVE: Middle-aged white woman with her right leg elevated and her right ankle in a boot Filed Vitals:   03/02/12 0938  BP: 153/68  Pulse: 84  Temp: 98.6 F (37 C)  Resp: 20     Body mass index is 31.31 kg/(m^2).    ECOG FS: 1 Filed Weights   03/02/12 0938  Weight: 182 lb 6.4 oz (82.736 kg)   Physical Exam: HEENT:  Sclerae anicteric.  Oropharynx clear.     Nodes:  No cervical, supraclavicular, or axillary lymphadenopathy  palpated.  Breast Exam:  The right breast is unremarkable; the left breast is status post modified radical mastectomy, with no evidence of local recurrence. Lungs:  Clear to auscultation bilaterally.  No crackles, rhonchi, or wheezes.   Heart:  Regular rate and rhythm.   Abdomen:  Soft, nontender.  Positive bowel sounds.  No organomegaly or masses palpated.   Musculoskeletal:  No focal spinal tenderness to palpation.  Extremities:  Right ankle boot in place.   Neuro:  Nonfocal. Alert and oriented x3.    LAB RESULTS:  Lab Results  Component Value Date   WBC 4.9 03/02/2012   NEUTROABS 3.2 03/02/2012   HGB 10.9* 03/02/2012   HCT 32.6* 03/02/2012   MCV 87.4 03/02/2012   PLT 232 03/02/2012      Chemistry      Component Value Date/Time   NA 136 02/12/2012 0913   NA 136 02/01/2012 0500   NA 142 11/05/2010 1036   K 4.0 02/12/2012 0913   K 4.5 02/01/2012 0500   K 4.5 11/05/2010 1036   CL 101 02/12/2012 0913   CL 102 02/01/2012 0500   CL 99 11/05/2010 1036   CO2 26 02/12/2012 0913   CO2 25 02/01/2012 0500   CO2 26 11/05/2010 1036   BUN 15.0 02/12/2012 0913   BUN 13 02/01/2012 0500   BUN 14 11/05/2010 1036   CREATININE 1.0 02/12/2012 0913   CREATININE 0.97 02/01/2012 0500   CREATININE 0.8 11/05/2010 1036      Component Value Date/Time   CALCIUM 8.6 02/01/2012 0500   CALCIUM 8.8 11/05/2010 1036   ALKPHOS 117 02/12/2012 0913   ALKPHOS 88 01/08/2012 0916   ALKPHOS 106* 11/05/2010 1036   AST 14 02/12/2012 0913   AST 24 01/08/2012 0916   AST 27 11/05/2010 1036   ALT 29 02/12/2012 0913   ALT 27 01/08/2012 0916   BILITOT 0.70 02/12/2012 0913   BILITOT 0.9 01/08/2012 0916   BILITOT 0.80 11/05/2010 1036       Lab Results  Component Value Date   LABCA2 41* 02/12/2012    STUDIES: Echocardiogram July 2013 continues to show a well preserved ejection fraction   ASSESSMENT: 61 year old [place] woman with stage IV breast cancer.  1. Status post  left modified radical mastectomy, August 2009, for a 5-cm  invasive ductal carcinoma, grade 3, involving 11/14 lymph nodes, triple positive. 2. Status post 4 cycles of adjuvant paclitaxel, carboplatin and trastuzumab completed November 2007. 3. Postmastectomy radiation completed February 2008. 4. Anastrozole begun February 2008 (the trastuzumab also was continued to complete a year), continued until June 2012.  5. 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  6. On tamoxifen/lapatinib/trastuzumab between June 2012 and October 2012.  7. Ixempra/lapatinib/trastuzumab started October 2012, the Ixempra discontinued in February 2013 due to peripheral neuropathy. 8. Continuing on lapatinib/trastuzumab with the addition of fulvestrant, first injections given on 07/31/2011. Lapatinib given at a dose of 1 g (4 tablets) daily, and trastuzumab given at 8 mg/kg every 4 weeks to coordinate with injection appointments.  PLAN:  The minimal increase in her CA 27-29 is not likely to be significant, and may be related to her recent surgery. This needs only followup, and we are rechecking her labs every month. She will receive trastuzumab today and every 28 days, and I have entered those orders all the way through January. She understands we're using is slightly "out-of-the-box" dose so she doesn't have to compare so frequently. We will continue to check echocardiograms every 3 months, the next one being due in October.  She really did not want to receive her fulvestrant today, because nurse Rebecca Pugh was not here to get it. We have moved to fulvestrant this 1 time 2 weeks from today, but she will receive it again in 4 weeks from today, 3 weeks after the next dose.  Been diagnosed to call for any problems that may develop before the next visit. If there is a further increase in the CA 27-29 I we will proceed to restaging studies.  MAGRINAT,GUSTAV C    03/02/2012

## 2012-03-03 ENCOUNTER — Encounter: Payer: Self-pay | Admitting: Oncology

## 2012-03-03 LAB — CANCER ANTIGEN 27.29: CA 27.29: 35 U/mL (ref 0–39)

## 2012-03-03 NOTE — Progress Notes (Signed)
Faxed application and requested info to [employer] for copay assistance.  Will call pt informing her of the transaction and advise her to follow up with them.

## 2012-03-05 ENCOUNTER — Ambulatory Visit: Payer: Self-pay

## 2012-03-09 ENCOUNTER — Other Ambulatory Visit: Payer: Medicare Other | Admitting: Lab

## 2012-03-09 ENCOUNTER — Ambulatory Visit (HOSPITAL_BASED_OUTPATIENT_CLINIC_OR_DEPARTMENT_OTHER): Payer: Medicare Other

## 2012-03-09 ENCOUNTER — Other Ambulatory Visit: Payer: Self-pay | Admitting: Physician Assistant

## 2012-03-09 VITALS — BP 160/89 | HR 69 | Temp 97.9°F | Resp 20

## 2012-03-09 DIAGNOSIS — C7970 Secondary malignant neoplasm of unspecified adrenal gland: Secondary | ICD-10-CM

## 2012-03-09 DIAGNOSIS — Z5111 Encounter for antineoplastic chemotherapy: Secondary | ICD-10-CM

## 2012-03-09 DIAGNOSIS — C773 Secondary and unspecified malignant neoplasm of axilla and upper limb lymph nodes: Secondary | ICD-10-CM

## 2012-03-09 DIAGNOSIS — C50919 Malignant neoplasm of unspecified site of unspecified female breast: Secondary | ICD-10-CM

## 2012-03-09 MED ORDER — FULVESTRANT 250 MG/5ML IM SOLN
500.0000 mg | INTRAMUSCULAR | Status: DC
  Administered 2012-03-09: 500 mg via INTRAMUSCULAR
  Filled 2012-03-09: qty 10

## 2012-03-12 ENCOUNTER — Other Ambulatory Visit: Payer: Self-pay | Admitting: *Deleted

## 2012-03-12 MED ORDER — LAPATINIB DITOSYLATE 250 MG PO TABS: 750.0000 mg | ORAL_TABLET | Freq: Every day | ORAL | Status: DC

## 2012-03-30 ENCOUNTER — Other Ambulatory Visit (HOSPITAL_BASED_OUTPATIENT_CLINIC_OR_DEPARTMENT_OTHER): Payer: Medicare Other | Admitting: Lab

## 2012-03-30 ENCOUNTER — Ambulatory Visit (HOSPITAL_BASED_OUTPATIENT_CLINIC_OR_DEPARTMENT_OTHER): Payer: Medicare Other

## 2012-03-30 VITALS — BP 156/53 | HR 75 | Temp 98.0°F | Resp 20

## 2012-03-30 DIAGNOSIS — Z5112 Encounter for antineoplastic immunotherapy: Secondary | ICD-10-CM

## 2012-03-30 DIAGNOSIS — C773 Secondary and unspecified malignant neoplasm of axilla and upper limb lymph nodes: Secondary | ICD-10-CM

## 2012-03-30 DIAGNOSIS — C50919 Malignant neoplasm of unspecified site of unspecified female breast: Secondary | ICD-10-CM

## 2012-03-30 DIAGNOSIS — Z5111 Encounter for antineoplastic chemotherapy: Secondary | ICD-10-CM

## 2012-03-30 LAB — CBC WITH DIFFERENTIAL/PLATELET
Basophils Absolute: 0.1 10*3/uL (ref 0.0–0.1)
HCT: 33.3 % — ABNORMAL LOW (ref 34.8–46.6)
HGB: 11.1 g/dL — ABNORMAL LOW (ref 11.6–15.9)
MONO#: 0.3 10*3/uL (ref 0.1–0.9)
NEUT#: 3.8 10*3/uL (ref 1.5–6.5)
NEUT%: 63.4 % (ref 38.4–76.8)
WBC: 6 10*3/uL (ref 3.9–10.3)
lymph#: 1.5 10*3/uL (ref 0.9–3.3)

## 2012-03-30 LAB — COMPREHENSIVE METABOLIC PANEL (CC13)
ALT: 19 U/L (ref 0–55)
Albumin: 3.5 g/dL (ref 3.5–5.0)
Alkaline Phosphatase: 98 U/L (ref 40–150)
CO2: 22 mEq/L (ref 22–29)
Glucose: 115 mg/dl — ABNORMAL HIGH (ref 70–99)
Potassium: 3.8 mEq/L (ref 3.5–5.1)
Sodium: 138 mEq/L (ref 136–145)
Total Protein: 6.6 g/dL (ref 6.4–8.3)

## 2012-03-30 MED ORDER — SODIUM CHLORIDE 0.9 % IJ SOLN
10.0000 mL | INTRAMUSCULAR | Status: DC | PRN
  Administered 2012-03-30: 10 mL
  Filled 2012-03-30: qty 10

## 2012-03-30 MED ORDER — ACETAMINOPHEN 325 MG PO TABS
650.0000 mg | ORAL_TABLET | Freq: Once | ORAL | Status: AC
  Administered 2012-03-30: 650 mg via ORAL

## 2012-03-30 MED ORDER — SODIUM CHLORIDE 0.9 % IV SOLN
Freq: Once | INTRAVENOUS | Status: AC
  Administered 2012-03-30: 10:00:00 via INTRAVENOUS

## 2012-03-30 MED ORDER — HEPARIN SOD (PORK) LOCK FLUSH 100 UNIT/ML IV SOLN
500.0000 [IU] | Freq: Once | INTRAVENOUS | Status: AC | PRN
  Administered 2012-03-30: 500 [IU]
  Filled 2012-03-30: qty 5

## 2012-03-30 MED ORDER — TRASTUZUMAB CHEMO INJECTION 440 MG
8.0000 mg/kg | Freq: Once | INTRAVENOUS | Status: AC
  Administered 2012-03-30: 672 mg via INTRAVENOUS
  Filled 2012-03-30: qty 32

## 2012-03-30 MED ORDER — FULVESTRANT 250 MG/5ML IM SOLN
500.0000 mg | INTRAMUSCULAR | Status: DC
  Administered 2012-03-30: 500 mg via INTRAMUSCULAR
  Filled 2012-03-30: qty 10

## 2012-03-30 NOTE — Patient Instructions (Addendum)
[facility] Discharge Instructions for Patients Receiving Chemotherapy  Today you received the following chemotherapy agents Herceptin and Faslodex To help prevent nausea and vomiting after your treatment, we encourage you to take your nausea medication   Take it as often as prescribed.   If you develop nausea and vomiting that is not controlled by your nausea medication, call the clinic. If it is after clinic hours your family physician or the after hours number for the clinic or go to the Emergency Department.   BELOW ARE SYMPTOMS THAT SHOULD BE REPORTED IMMEDIATELY:  *FEVER GREATER THAN 100.5 F  *CHILLS WITH OR WITHOUT FEVER  NAUSEA AND VOMITING THAT IS NOT CONTROLLED WITH YOUR NAUSEA MEDICATION  *UNUSUAL SHORTNESS OF BREATH  *UNUSUAL BRUISING OR BLEEDING  TENDERNESS IN MOUTH AND THROAT WITH OR WITHOUT PRESENCE OF ULCERS  *URINARY PROBLEMS  *BOWEL PROBLEMS  UNUSUAL RASH Items with * indicate a potential emergency and should be followed up as soon as possible.  If this is your first treatment one of the nurses will contact you 24 hours after your treatment. Please let the nurse know about any problems that you may have experienced. Feel free to call the clinic you have any questions or concerns. The clinic phone number is [phone].   I have been informed and understand all the instructions given to me. I know to contact the clinic, my physician, or go to the Emergency Department if any problems should occur. I do not have any questions at this time, but understand that I may call the clinic during office hours   should I have any questions or need assistance in obtaining follow up care.    __________________________________________  _____________  __________ Signature of Patient or Authorized Representative            Date                   Time    __________________________________________ Nurse's Signature

## 2012-03-31 ENCOUNTER — Ambulatory Visit
Admission: RE | Admit: 2012-03-31 | Discharge: 2012-03-31 | Disposition: A | Discharge status: Home / Self Care | Payer: Medicare Other | Source: Ambulatory Visit | Attending: Physician Assistant | Admitting: Physician Assistant

## 2012-03-31 DIAGNOSIS — Z1231 Encounter for screening mammogram for malignant neoplasm of breast: Secondary | ICD-10-CM

## 2012-03-31 LAB — CANCER ANTIGEN 27.29: CA 27.29: 54 U/mL — ABNORMAL HIGH (ref 0–39)

## 2012-04-07 ENCOUNTER — Encounter (HOSPITAL_COMMUNITY): Payer: Self-pay | Admitting: Cardiology

## 2012-04-11 ENCOUNTER — Other Ambulatory Visit: Payer: Self-pay | Admitting: Oncology

## 2012-04-27 ENCOUNTER — Other Ambulatory Visit (HOSPITAL_BASED_OUTPATIENT_CLINIC_OR_DEPARTMENT_OTHER): Payer: Medicare Other | Admitting: Lab

## 2012-04-27 ENCOUNTER — Ambulatory Visit (HOSPITAL_BASED_OUTPATIENT_CLINIC_OR_DEPARTMENT_OTHER): Payer: Medicare Other | Admitting: Physician Assistant

## 2012-04-27 ENCOUNTER — Other Ambulatory Visit: Payer: Medicare Other | Admitting: Lab

## 2012-04-27 ENCOUNTER — Encounter: Payer: Self-pay | Admitting: Physician Assistant

## 2012-04-27 ENCOUNTER — Telehealth: Payer: Self-pay | Admitting: *Deleted

## 2012-04-27 ENCOUNTER — Ambulatory Visit (HOSPITAL_BASED_OUTPATIENT_CLINIC_OR_DEPARTMENT_OTHER): Payer: Medicare Other

## 2012-04-27 VITALS — BP 129/71 | HR 82 | Temp 98.4°F | Resp 20 | Ht 64.0 in | Wt 184.6 lb

## 2012-04-27 DIAGNOSIS — Z5111 Encounter for antineoplastic chemotherapy: Secondary | ICD-10-CM

## 2012-04-27 DIAGNOSIS — C773 Secondary and unspecified malignant neoplasm of axilla and upper limb lymph nodes: Secondary | ICD-10-CM

## 2012-04-27 DIAGNOSIS — C50919 Malignant neoplasm of unspecified site of unspecified female breast: Secondary | ICD-10-CM

## 2012-04-27 DIAGNOSIS — Z5112 Encounter for antineoplastic immunotherapy: Secondary | ICD-10-CM

## 2012-04-27 DIAGNOSIS — C7970 Secondary malignant neoplasm of unspecified adrenal gland: Secondary | ICD-10-CM

## 2012-04-27 LAB — CBC WITH DIFFERENTIAL/PLATELET
Basophils Absolute: 0.1 10*3/uL (ref 0.0–0.1)
EOS%: 4.6 % (ref 0.0–7.0)
Eosinophils Absolute: 0.3 10*3/uL (ref 0.0–0.5)
HCT: 35.2 % (ref 34.8–46.6)
HGB: 11.6 g/dL (ref 11.6–15.9)
MCH: 28.9 pg (ref 25.1–34.0)
NEUT#: 4.5 10*3/uL (ref 1.5–6.5)
NEUT%: 64.4 % (ref 38.4–76.8)
RDW: 13.9 % (ref 11.2–14.5)
lymph#: 1.6 10*3/uL (ref 0.9–3.3)

## 2012-04-27 LAB — COMPREHENSIVE METABOLIC PANEL (CC13)
Albumin: 3.4 g/dL — ABNORMAL LOW (ref 3.5–5.0)
Alkaline Phosphatase: 100 U/L (ref 40–150)
BUN: 17 mg/dL (ref 7.0–26.0)
CO2: 25 mEq/L (ref 22–29)
Calcium: 9 mg/dL (ref 8.4–10.4)
Chloride: 108 mEq/L — ABNORMAL HIGH (ref 98–107)
Glucose: 145 mg/dl — ABNORMAL HIGH (ref 70–99)
Potassium: 4.2 mEq/L (ref 3.5–5.1)
Sodium: 140 mEq/L (ref 136–145)
Total Protein: 6.6 g/dL (ref 6.4–8.3)

## 2012-04-27 MED ORDER — TRASTUZUMAB CHEMO INJECTION 440 MG
8.0000 mg/kg | Freq: Once | INTRAVENOUS | Status: AC
  Administered 2012-04-27: 672 mg via INTRAVENOUS
  Filled 2012-04-27: qty 32

## 2012-04-27 MED ORDER — SODIUM CHLORIDE 0.9 % IJ SOLN
10.0000 mL | INTRAMUSCULAR | Status: DC | PRN
  Administered 2012-04-27: 10 mL
  Filled 2012-04-27: qty 10

## 2012-04-27 MED ORDER — HEPARIN SOD (PORK) LOCK FLUSH 100 UNIT/ML IV SOLN
500.0000 [IU] | Freq: Once | INTRAVENOUS | Status: AC | PRN
  Administered 2012-04-27: 500 [IU]
  Filled 2012-04-27: qty 5

## 2012-04-27 MED ORDER — SODIUM CHLORIDE 0.9 % IV SOLN
Freq: Once | INTRAVENOUS | Status: AC
  Administered 2012-04-27: 20 mL via INTRAVENOUS

## 2012-04-27 MED ORDER — ACETAMINOPHEN 325 MG PO TABS
650.0000 mg | ORAL_TABLET | Freq: Once | ORAL | Status: AC
  Administered 2012-04-27: 650 mg via ORAL

## 2012-04-27 MED ORDER — DIPHENHYDRAMINE HCL 25 MG PO CAPS: 25.0000 mg | ORAL_CAPSULE | Freq: Once | ORAL | Status: DC

## 2012-04-27 MED ORDER — FULVESTRANT 250 MG/5ML IM SOLN
500.0000 mg | INTRAMUSCULAR | Status: DC
  Administered 2012-04-27: 500 mg via INTRAMUSCULAR
  Filled 2012-04-27: qty 10

## 2012-04-27 NOTE — Telephone Encounter (Signed)
Made patient appointment for echo and with [email or web address]

## 2012-04-27 NOTE — Progress Notes (Signed)
ID: Rebecca Pugh   DOB: 1950/11/19  MR#: [id number]  YNW#:[id number]  HISTORY OF PRESENT ILLNESS: The patient and her husband, Rebecca Pugh, moved back to this area recently from [facility] (Rebecca Pugh actually is a native of [place] and incidentally remains a [employer] fan).  As part of moving boxes, Rebecca Pugh felt something under her left arm.  She brought it to [name] attention and although the patient states that she has always had some cyclical swelling of her left axilla during menstruation, [name] set the patient up for evaluation at [facility].  On 12-12-05 the patient had a mammogram and ultrasound there and these showed no mass, distortion or microcalcifications in either breast however, in the left axilla there was a large macrolobulated mass and just above that there was a slightly prominent lymph node which did demonstrate a fatty hilum.  Physically on exam the mass was mobile and nontender and measured approximately 6.5 cm.  The ultrasound showed two small hypoechoic nodules in the left breast 3 cm. from the nipple measuring 7 mm. each and felt to be most likely benign however, the axillary mass, of course, was quite suspicious and biopsy was performed the same day.  This showed ([id number] and [id number]) invasive breast cancer which was strongly ER positive at 76%, moderately PR positive at 25% with a very high proliferation marker at 66%.  HER-2/neu was 1+ and FISH showed no amplification with a ratio of 1.1.    With this information, the patient was referred to Dr. [name] and on 12-20-05 the patient had bilateral breast MRI's.  In the left breast there was a 5.8 cm. axillary mass and several abnormally enlarged lymph nodes.  There was a 7 mm. left supraclavicular lymph node and the two nodules in the breast that had been noted by ultrasound were also noted by MRI measuring 1.1 and 0.7 cm.  In the right breast there were no abnormalities noted.  MR guided biopsies of the left breast  masses were obtained on 12-27-05 and both showed high grade ductal carcinoma.  There was evidence of lymphovascular invasion and high grade ductal carcinoma in situ as well ([id number]).    [name] discussed the situation with me and we felt it would be useful to have a PET scan prior to definitive surgery.  This was obtained on 12-30-05 and showed the primary axillary mass to have an SUV of 8.5.  The lymph nodes in the left axilla had SUV's in the 2.2 range, small focus of increased activity in the left breast had an SUV of 2.3.  There was also skin thickening with mildly increased diffuse FDG uptake within the skin suggesting an inflammatory breast cancer.  The neck, abdomen and  pelvis were negative.  In the lung windows, there were several scattered small areas of focal nodular density in the upper lobes with compressive atelectasis and this was felt to be benign.  Chest x-ray on 01-09-06 was negative.  With this information, [name] proceeded, after appropriate discussion, to left modified radical mastectomy on 01-15-06 and the insertion of a BARD port at the same time.  The final pathology ([id number]) showed the maximum tumor size to be 5 cm.  Margins were ample.  There was evidence of lymphovascular invasion.  This infiltrating ductal carcinoma was a grade 3 of 3 and 11 out of 14 lymph nodes were involved.  There was evidence of extracapsular extension.    The patient received 4 cycles of adjuvant  paclitaxel/carboplatin/trastuzumab which was completed in November 2007 and was followed by postmastectomy radiation. Trastuzumab was continued for one year. Radiation was completed in February 2008 at which time anastrozole was started. 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 Additional treatment plan is as detailed below.  INTERVAL HISTORY: Rebecca Pugh returns today  for followup of her metastatic breast carcinoma. She is due for her next q. 28 day dose of fulvestrant and trastuzumab. She continues on lapatinib.  Rebecca Pugh continues to heal from her right ankle surgery. She is out of the boot now and is putting more weight on the right leg. It still causes her some discomfort at times. It has been a "long ordeal".   REVIEW OF SYSTEMS: Rebecca Pugh  denies any fevers or chills. She has no significant hot flashes. Other than the ankle pain, she denies any new or unusual myalgias or arthralgias and has had no peripheral swelling. She's eating and drinking well with no nausea or change in bowel habits. She denies any increased cough, shortness of breath, or orthopnea. She's had no chest pain or palpitations. No abnormal headaches or dizziness.  A detailed review of systems is otherwise stable and noncontributory.  PAST MEDICAL HISTORY: Past Medical History  Diagnosis Date  . Hypertension   . Depression   . breast ca dx'd 01/15/2006    chemo/xrt comp  remote tonsillectomy, remote cholecystectomy, status post appendectomy, multiple skin biopsies for what the patient said were simple keratoses, and status post port placement.  FAMILY HISTORY: The patient has no information regarding her father. She was brought up by her stepfather. The patient's mother died at the age of 36 with bladder cancer. The patient has one sister who is in good health. There is no history of breast or ovarian cancer in the family to the patient's knowledge.   GYNECOLOGIC HISTORY: She is GX, P2. She was premenopausal at the time of her initial breast cancer diagnosis  SOCIAL HISTORY: She and her husband, Rebecca Pugh, have been married 10 years. 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 She has two children, a daughter, Rebecca Pugh, who lives in [place] and is a Investment banker, corporate, and a son, Rebecca Pugh, also in [place], who works for  [employer]. She has two grandchildren and three step-grandchildren from her own two children, Rebecca Pugh and Rebecca Pugh, who are from her first marriage. Rebecca Pugh has one daughter from his first marriage and he has grandchildren through her. The patient was brought up as a Catholic but currently is not practicing. She and Rebecca Pugh are attending a [employer].      ADVANCED DIRECTIVES:  HEALTH MAINTENANCE: History  Substance Use Topics  . Smoking status: Former Smoker -- 20 years    Quit date: 01/31/1995  . Smokeless tobacco: Never Used  . Alcohol Use: Yes     Comment: occasionl     Colonoscopy:  PAP:  Bone density: August 2012, "Normal"  Lipid panel:  No Known Allergies  Current Outpatient Prescriptions  Medication Sig Dispense Refill  . calcium citrate (CALCITRATE - DOSED IN MG ELEMENTAL CALCIUM) 950 MG tablet Take 1 tablet by mouth daily.        . cholecalciferol (VITAMIN D) 1000 UNITS tablet Take 3,000 Units by mouth daily.        Marland Kitchen FLUoxetine (PROZAC) 20 MG capsule  Take 1 capsule (20 mg total) by mouth daily.  30 capsule  11  . lapatinib (TYKERB) 250 MG tablet Take 3 tablets (750 mg total) by mouth daily.  270 tablet  3  . loperamide (IMODIUM) 2 MG capsule Take 2 mg by mouth 4 (four) times daily as needed. diarrhea      . LORazepam (ATIVAN) 0.5 MG tablet Take 1 tablet (0.5 mg total) by mouth 2 (two) times daily as needed.  30 tablet  0  . losartan (COZAAR) 50 MG tablet Take 1 tablet (50 mg total) by mouth daily.  30 tablet  6  . methocarbamol (ROBAXIN) 500 MG tablet       . oxyCODONE (OXY IR/ROXICODONE) 5 MG immediate release tablet       . potassium chloride (KLOR-CON) 20 MEQ packet Take 20 mEq by mouth daily.       . traMADol (ULTRAM) 50 MG tablet        No current facility-administered medications for this visit.   Facility-Administered Medications Ordered in Other Visits  Medication Dose Route Frequency Provider Last Rate Last Dose  . [COMPLETED] 0.9 %  sodium chloride  infusion   Intravenous Once Lowella Dell, MD   20 mL at 04/27/12 1036  . [COMPLETED] acetaminophen (TYLENOL) tablet 650 mg  650 mg Oral Once Lowella Dell, MD   650 mg at 04/27/12 1037  . diphenhydrAMINE (BENADRYL) capsule 25 mg  25 mg Oral Once Lowella Dell, MD      . fulvestrant (FASLODEX) injection 500 mg  500 mg Intramuscular Q30 days Lowella Dell, MD   500 mg at 04/27/12 1159  . [COMPLETED] heparin lock flush 100 unit/mL  500 Units Intracatheter Once PRN Lowella Dell, MD   500 Units at 04/27/12 1149  . sodium chloride 0.9 % injection 10 mL  10 mL Intracatheter PRN Lowella Dell, MD   10 mL at 04/27/12 1149  . [COMPLETED] trastuzumab (HERCEPTIN) 672 mg in sodium chloride 0.9 % 250 mL chemo infusion  8 mg/kg (Order-Specific) Intravenous Once Lowella Dell, MD   672 mg at 04/27/12 1110     OBJECTIVE: Middle-aged white woman who appears comfortable and in no acute distress. She is walking with a limp secondary to injury to the right ankle Filed Vitals:   04/27/12 0922  BP: 129/71  Pulse: 82  Temp: 98.4 F (36.9 C)  Resp: 20     Body mass index is 31.69 kg/(m^2).    ECOG FS: 1 Filed Weights   04/27/12 0922  Weight: 184 lb 9.6 oz (83.734 kg)   Physical Exam: HEENT:  Sclerae anicteric.  Oropharynx clear.     Nodes:  No cervical, supraclavicular, or axillary lymphadenopathy palpated.  Breast Exam:  Deferred. No axillary adenopathy  Lungs:  Clear to auscultation bilaterally.  No crackles, rhonchi, or wheezes.   Heart:  Regular rate and rhythm.   Abdomen:  Soft, nontender.  Positive bowel sounds.  Musculoskeletal:  No focal spinal tenderness to palpation.  Extremities:   nonpitting edema in the right ankle. No additional peripheral edema noted   Neuro:  Nonfocal. Alert and oriented x3.    LAB RESULTS:  Lab Results  Component Value Date   WBC 7.0 04/27/2012   NEUTROABS 4.5 04/27/2012   HGB 11.6 04/27/2012   HCT 35.2 04/27/2012   MCV 87.8 04/27/2012     PLT 256 04/27/2012      Chemistry      Component Value Date/Time  NA 140 04/27/2012 0859   NA 136 02/01/2012 0500   NA 142 11/05/2010 1036   K 4.2 04/27/2012 0859   K 4.5 02/01/2012 0500   K 4.5 11/05/2010 1036   CL 108* 04/27/2012 0859   CL 102 02/01/2012 0500   CL 99 11/05/2010 1036   CO2 25 04/27/2012 0859   CO2 25 02/01/2012 0500   CO2 26 11/05/2010 1036   BUN 17.0 04/27/2012 0859   BUN 13 02/01/2012 0500   BUN 14 11/05/2010 1036   CREATININE 1.0 04/27/2012 0859   CREATININE 0.97 02/01/2012 0500   CREATININE 0.8 11/05/2010 1036      Component Value Date/Time   CALCIUM 9.0 04/27/2012 0859   CALCIUM 8.6 02/01/2012 0500   CALCIUM 8.8 11/05/2010 1036   ALKPHOS 100 04/27/2012 0859   ALKPHOS 88 01/08/2012 0916   ALKPHOS 106* 11/05/2010 1036   AST 20 04/27/2012 0859   AST 24 01/08/2012 0916   AST 27 11/05/2010 1036   ALT 22 04/27/2012 0859   ALT 27 01/08/2012 0916   BILITOT 1.01 04/27/2012 0859   BILITOT 0.9 01/08/2012 0916   BILITOT 0.80 11/05/2010 1036       Lab Results  Component Value Date   LABCA2 54* 03/30/2012    STUDIES: Echocardiogram July 2013 continues to show a well preserved ejection fraction   ASSESSMENT: 61 year old [place] woman with stage IV breast cancer.  1. 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 2. Status post 4 cycles of adjuvant paclitaxel, carboplatin and trastuzumab completed November 2007. 3. Postmastectomy radiation completed February 2008. 4. Anastrozole begun February 2008 (the trastuzumab also was continued to complete a year), continued until June 2012.  5. 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  6. On tamoxifen/lapatinib/trastuzumab between June 2012 and October 2012.  7. Ixempra/lapatinib/trastuzumab started October 2012, the Ixempra discontinued  in February 2013 due to peripheral neuropathy. 8. Continuing on lapatinib/trastuzumab with the addition of fulvestrant, first injections given on 07/31/2011. Lapatinib given at a dose of 1 g (4 tablets) daily, and trastuzumab given at 8 mg/kg every 4 weeks to coordinate with injection appointments.  PLAN:  Tonee will proceed to treatment today as scheduled for both fulvestrant and trastuzumab. We will continue to give both on a every 4 week basis as before. She is slightly past due for her echocardiogram, and I have requested that be obtained in the next couple of weeks, prior to her next dose of trastuzumab in mid December.  We are following Rebecca Pugh's tumor marker very closely. [name] feels as stated in his last dictation that the minimal increase in her CA 27-29 is not likely to be significant, and may be related to her recent surgery. We will await today's results, and if it is again elevated, we will consider pursuing restaging studies. If not, we will simply continue to follow on a monthly basis.  All this was reviewed in detail with Rebecca Pugh today who voices understanding and agreement with this plan. She'll call should any changes or problems.    Krizia Flight    04/27/2012

## 2012-04-28 ENCOUNTER — Telehealth: Payer: Self-pay | Admitting: *Deleted

## 2012-04-28 ENCOUNTER — Telehealth: Payer: Self-pay | Admitting: Oncology

## 2012-04-28 ENCOUNTER — Other Ambulatory Visit: Payer: Self-pay | Admitting: Physician Assistant

## 2012-04-28 NOTE — Telephone Encounter (Signed)
Add on 06-29-2012 at 11:00am

## 2012-04-28 NOTE — Telephone Encounter (Signed)
S/w the pt and she is aware of her r/s echo/benshimon appt from 06/01/2012 to 05/21/2012

## 2012-05-11 ENCOUNTER — Other Ambulatory Visit: Payer: Self-pay | Admitting: Physician Assistant

## 2012-05-11 DIAGNOSIS — F329 Major depressive disorder, single episode, unspecified: Secondary | ICD-10-CM

## 2012-05-11 DIAGNOSIS — C50919 Malignant neoplasm of unspecified site of unspecified female breast: Secondary | ICD-10-CM

## 2012-05-11 DIAGNOSIS — F3289 Other specified depressive episodes: Secondary | ICD-10-CM

## 2012-05-11 NOTE — Telephone Encounter (Signed)
Pharmacist reports the refill on 12-11-2011 was a written script for three refills and she does not have refills on file.

## 2012-05-21 ENCOUNTER — Ambulatory Visit (HOSPITAL_COMMUNITY)
Admission: RE | Admit: 2012-05-21 | Discharge: 2012-05-21 | Disposition: A | Discharge status: Home / Self Care | Payer: Medicare Other | Source: Ambulatory Visit | Attending: Internal Medicine | Admitting: Internal Medicine

## 2012-05-21 VITALS — BP 148/64 | HR 80 | Wt 177.4 lb

## 2012-05-21 DIAGNOSIS — D4989 Neoplasm of unspecified behavior of other specified sites: Secondary | ICD-10-CM

## 2012-05-21 DIAGNOSIS — C50919 Malignant neoplasm of unspecified site of unspecified female breast: Secondary | ICD-10-CM | POA: Insufficient documentation

## 2012-05-21 NOTE — Assessment & Plan Note (Addendum)
I reviewed echos personally. EF and Doppler parameters stable. No HF on exam. Continue Herceptin. F/u in 3 months.

## 2012-05-21 NOTE — Patient Instructions (Addendum)
Follow up in 3 months with an ECHO 

## 2012-05-25 ENCOUNTER — Other Ambulatory Visit (HOSPITAL_BASED_OUTPATIENT_CLINIC_OR_DEPARTMENT_OTHER): Payer: Medicare Other | Admitting: Lab

## 2012-05-25 ENCOUNTER — Ambulatory Visit (HOSPITAL_BASED_OUTPATIENT_CLINIC_OR_DEPARTMENT_OTHER): Payer: Medicare Other

## 2012-05-25 ENCOUNTER — Encounter: Payer: Self-pay | Admitting: Physician Assistant

## 2012-05-25 ENCOUNTER — Other Ambulatory Visit: Payer: Medicare Other | Admitting: Lab

## 2012-05-25 ENCOUNTER — Ambulatory Visit (HOSPITAL_BASED_OUTPATIENT_CLINIC_OR_DEPARTMENT_OTHER): Payer: Medicare Other | Admitting: Physician Assistant

## 2012-05-25 ENCOUNTER — Telehealth: Payer: Self-pay | Admitting: *Deleted

## 2012-05-25 VITALS — BP 150/78 | HR 66 | Temp 98.0°F | Resp 20 | Ht 64.0 in | Wt 182.6 lb

## 2012-05-25 DIAGNOSIS — Z5111 Encounter for antineoplastic chemotherapy: Secondary | ICD-10-CM

## 2012-05-25 DIAGNOSIS — C773 Secondary and unspecified malignant neoplasm of axilla and upper limb lymph nodes: Secondary | ICD-10-CM

## 2012-05-25 DIAGNOSIS — Z5112 Encounter for antineoplastic immunotherapy: Secondary | ICD-10-CM

## 2012-05-25 DIAGNOSIS — C50419 Malignant neoplasm of upper-outer quadrant of unspecified female breast: Secondary | ICD-10-CM

## 2012-05-25 DIAGNOSIS — C50919 Malignant neoplasm of unspecified site of unspecified female breast: Secondary | ICD-10-CM

## 2012-05-25 DIAGNOSIS — C7970 Secondary malignant neoplasm of unspecified adrenal gland: Secondary | ICD-10-CM

## 2012-05-25 LAB — COMPREHENSIVE METABOLIC PANEL (CC13)
ALT: 21 U/L (ref 0–55)
AST: 18 U/L (ref 5–34)
Albumin: 3.6 g/dL (ref 3.5–5.0)
Alkaline Phosphatase: 114 U/L (ref 40–150)
BUN: 17 mg/dL (ref 7.0–26.0)
CO2: 24 meq/L (ref 22–29)
Calcium: 9 mg/dL (ref 8.4–10.4)
Chloride: 106 meq/L (ref 98–107)
Creatinine: 0.9 mg/dL (ref 0.6–1.1)
Glucose: 85 mg/dL (ref 70–99)
Potassium: 4.5 meq/L (ref 3.5–5.1)
Sodium: 139 meq/L (ref 136–145)
Total Bilirubin: 0.72 mg/dL (ref 0.20–1.20)
Total Protein: 6.9 g/dL (ref 6.4–8.3)

## 2012-05-25 LAB — CANCER ANTIGEN 27.29: CA 27.29: 65 U/mL — ABNORMAL HIGH (ref 0–39)

## 2012-05-25 LAB — CBC WITH DIFFERENTIAL/PLATELET
BASO%: 1 % (ref 0.0–2.0)
Basophils Absolute: 0.1 10e3/uL (ref 0.0–0.1)
EOS%: 6.3 % (ref 0.0–7.0)
Eosinophils Absolute: 0.3 10e3/uL (ref 0.0–0.5)
HCT: 33.4 % — ABNORMAL LOW (ref 34.8–46.6)
HGB: 11 g/dL — ABNORMAL LOW (ref 11.6–15.9)
LYMPH%: 30.6 % (ref 14.0–49.7)
MCH: 28.7 pg (ref 25.1–34.0)
MCHC: 32.9 g/dL (ref 31.5–36.0)
MCV: 87.2 fL (ref 79.5–101.0)
MONO#: 0.3 10e3/uL (ref 0.1–0.9)
MONO%: 5.9 % (ref 0.0–14.0)
NEUT#: 2.8 10e3/uL (ref 1.5–6.5)
NEUT%: 56.2 % (ref 38.4–76.8)
Platelets: 280 10e3/uL (ref 145–400)
RBC: 3.83 10e6/uL (ref 3.70–5.45)
RDW: 13.8 % (ref 11.2–14.5)
WBC: 5.1 10e3/uL (ref 3.9–10.3)
lymph#: 1.6 10e3/uL (ref 0.9–3.3)

## 2012-05-25 MED ORDER — FULVESTRANT 250 MG/5ML IM SOLN
500.0000 mg | INTRAMUSCULAR | Status: DC
  Administered 2012-05-25: 500 mg via INTRAMUSCULAR
  Filled 2012-05-25: qty 10

## 2012-05-25 MED ORDER — HEPARIN SOD (PORK) LOCK FLUSH 100 UNIT/ML IV SOLN
500.0000 [IU] | Freq: Once | INTRAVENOUS | Status: AC | PRN
  Administered 2012-05-25: 500 [IU]
  Filled 2012-05-25: qty 5

## 2012-05-25 MED ORDER — ACETAMINOPHEN 325 MG PO TABS
650.0000 mg | ORAL_TABLET | Freq: Once | ORAL | Status: AC
  Administered 2012-05-25: 650 mg via ORAL

## 2012-05-25 MED ORDER — SODIUM CHLORIDE 0.9 % IJ SOLN
10.0000 mL | INTRAMUSCULAR | Status: DC | PRN
  Administered 2012-05-25: 10 mL
  Filled 2012-05-25: qty 10

## 2012-05-25 MED ORDER — TRASTUZUMAB CHEMO INJECTION 440 MG
8.0000 mg/kg | Freq: Once | INTRAVENOUS | Status: AC
  Administered 2012-05-25: 672 mg via INTRAVENOUS
  Filled 2012-05-25: qty 32

## 2012-05-25 MED ORDER — SODIUM CHLORIDE 0.9 % IV SOLN
Freq: Once | INTRAVENOUS | Status: AC
  Administered 2012-05-25: 10:00:00 via INTRAVENOUS

## 2012-05-25 NOTE — Telephone Encounter (Signed)
Gave patient appointment for 06-02-2012 at 11:00am   Gave patient instructions on getting an call from central scheduling

## 2012-05-25 NOTE — Progress Notes (Signed)
Pt refused benadryl pre-medication, states she does not take this.

## 2012-05-25 NOTE — Patient Instructions (Addendum)
[facility] Discharge Instructions for Patients Receiving Chemotherapy  Today you received the following chemotherapy agents : Herceptin and Faslodex   BELOW ARE SYMPTOMS THAT SHOULD BE REPORTED IMMEDIATELY:  *FEVER GREATER THAN 100.5 F  *CHILLS WITH OR WITHOUT FEVER  NAUSEA AND VOMITING THAT IS NOT CONTROLLED WITH YOUR NAUSEA MEDICATION  *UNUSUAL SHORTNESS OF BREATH  *UNUSUAL BRUISING OR BLEEDING  TENDERNESS IN MOUTH AND THROAT WITH OR WITHOUT PRESENCE OF ULCERS  *URINARY PROBLEMS  *BOWEL PROBLEMS  UNUSUAL RASH Items with * indicate a potential emergency and should be followed up as soon as possible.  Feel free to call the clinic you have any questions or concerns. The clinic phone number is [phone].

## 2012-05-25 NOTE — Progress Notes (Signed)
ID: [name]   DOB: 03/01/1951  MR#: [id number]  NWG#:[id number]  HISTORY OF PRESENT ILLNESS: The patient and her husband, Gene, moved back to this area recently from [facility] (Gene actually is a native of [place] and incidentally remains a [employer] fan).  As part of moving boxes, Ms. Norlander felt something under her left arm.  She brought it to [name] attention and although the patient states that she has always had some cyclical swelling of her left axilla during menstruation, [name] set the patient up for evaluation at [facility].  On 12-12-05 the patient had a mammogram and ultrasound there and these showed no mass, distortion or microcalcifications in either breast however, in the left axilla there was a large macrolobulated mass and just above that there was a slightly prominent lymph node which did demonstrate a fatty hilum.  Physically on exam the mass was mobile and nontender and measured approximately 6.5 cm.  The ultrasound showed two small hypoechoic nodules in the left breast 3 cm. from the nipple measuring 7 mm. each and felt to be most likely benign however, the axillary mass, of course, was quite suspicious and biopsy was performed the same day.  This showed ([id number] and [id number]) invasive breast cancer which was strongly ER positive at 76%, moderately PR positive at 25% with a very high proliferation marker at 66%.  HER-2/neu was 1+ and FISH showed no amplification with a ratio of 1.1.    With this information, the patient was referred to Dr. [name] and on 12-20-05 the patient had bilateral breast MRI's.  In the left breast there was a 5.8 cm. axillary mass and several abnormally enlarged lymph nodes.  There was a 7 mm. left supraclavicular lymph node and the two nodules in the breast that had been noted by ultrasound were also noted by MRI measuring 1.1 and 0.7 cm.  In the right breast there were no abnormalities noted.  MR guided biopsies of the left breast  masses were obtained on 12-27-05 and both showed high grade ductal carcinoma.  There was evidence of lymphovascular invasion and high grade ductal carcinoma in situ as well ([id number]).    [name] discussed the situation with me and we felt it would be useful to have a PET scan prior to definitive surgery.  This was obtained on 12-30-05 and showed the primary axillary mass to have an SUV of 8.5.  The lymph nodes in the left axilla had SUV's in the 2.2 range, small focus of increased activity in the left breast had an SUV of 2.3.  There was also skin thickening with mildly increased diffuse FDG uptake within the skin suggesting an inflammatory breast cancer.  The neck, abdomen and  pelvis were negative.  In the lung windows, there were several scattered small areas of focal nodular density in the upper lobes with compressive atelectasis and this was felt to be benign.  Chest x-ray on 01-09-06 was negative.  With this information, [name] proceeded, after appropriate discussion, to left modified radical mastectomy on 01-15-06 and the insertion of a BARD port at the same time.  The final pathology ([id number]) showed the maximum tumor size to be 5 cm.  Margins were ample.  There was evidence of lymphovascular invasion.  This infiltrating ductal carcinoma was a grade 3 of 3 and 11 out of 14 lymph nodes were involved.  There was evidence of extracapsular extension.    The patient received 4 cycles of adjuvant  paclitaxel/carboplatin/trastuzumab which was completed in November 2007 and was followed by postmastectomy radiation. Trastuzumab was continued for one year. Radiation was completed in February 2008 at which time anastrozole was started. 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 Additional treatment plan is as detailed below.  INTERVAL HISTORY: Keyshawna returns today  for followup of her metastatic breast carcinoma. She is due for her next q. 28 day dose of fulvestrant and trastuzumab. She continues on lapatinib.  Mickaela's biggest concern today is the development of what she feels like are lymph nodes in the left side of her neck. She drove "cross country" from [place] to [place] a couple of weeks ago. She noticed sensitivity in that area associated with the seatbelt. When she rubbed her neck, she felt these new "lumps" there.    REVIEW OF SYSTEMS: [place]  denies any fevers or chills. She has no significant hot flashes.  She denies any new or unusual myalgias or arthralgias and has had no peripheral swelling other than chronic swelling in the right ankle status post injury/surgery. She's eating and drinking well with no nausea or change in bowel habits. She continues to alternate somewhat between mild diarrhea and mild constipation and attributes this to the lapatinib. She is able to "keep things moving", however. She denies any increased cough, shortness of breath, or orthopnea. She's had no chest pain or palpitations. No abnormal headaches or dizziness.  A detailed review of systems is otherwise stable and noncontributory.   PAST MEDICAL HISTORY: Past Medical History  Diagnosis Date  . Hypertension   . Depression   . breast ca dx'd 01/15/2006    chemo/xrt comp  remote tonsillectomy, remote cholecystectomy, status post appendectomy, multiple skin biopsies for what the patient said were simple keratoses, and status post port placement.  FAMILY HISTORY: The patient has no information regarding her father. She was brought up by her stepfather. The patient's mother died at the age of 38 with bladder cancer. The patient has one sister who is in good health. There is no history of breast or ovarian cancer in the family to the patient's knowledge.   GYNECOLOGIC HISTORY: She is GX, P2. She was premenopausal at the time of her initial breast cancer  diagnosis  SOCIAL HISTORY: She and her husband, Gene, have been married 10 years. 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 She has two children, a daughter, [name], who lives in [place] and is a Investment banker, corporate, and a son, [name], also in [place], who works for [employer]. She has two grandchildren and three step-grandchildren from her own two children, [name] and [name], who are from her first marriage. Gene has one daughter from his first marriage and he has grandchildren through her. The patient was brought up as a Catholic but currently is not practicing. She and Gene are attending a [employer].      ADVANCED DIRECTIVES:  HEALTH MAINTENANCE: History  Substance Use Topics  . Smoking status: Former Smoker -- 20 years    Quit date: 01/31/1995  . Smokeless tobacco: Never Used  . Alcohol Use: Yes     Comment: occasionl     Colonoscopy:  PAP:  Bone density: August 2012, "Normal"  Lipid panel:  No Known Allergies  Current Outpatient Prescriptions  Medication Sig Dispense Refill  .  calcium citrate (CALCITRATE - DOSED IN MG ELEMENTAL CALCIUM) 950 MG tablet Take 1 tablet by mouth daily.        . cholecalciferol (VITAMIN D) 1000 UNITS tablet Take 3,000 Units by mouth daily.        Marland Kitchen FLUoxetine (PROZAC) 20 MG capsule TAKE ONE CAPSULE BY MOUTH EVERY DAY  30 capsule  3  . lapatinib (TYKERB) 250 MG tablet Take 3 tablets (750 mg total) by mouth daily.  270 tablet  3  . loperamide (IMODIUM) 2 MG capsule Take 2 mg by mouth 4 (four) times daily as needed. diarrhea      . LORazepam (ATIVAN) 0.5 MG tablet Take 1 tablet (0.5 mg total) by mouth 2 (two) times daily as needed.  30 tablet  0  . losartan (COZAAR) 50 MG tablet Take 1 tablet (50 mg total) by mouth daily.  30 tablet  6  . oxyCODONE (OXY IR/ROXICODONE) 5 MG immediate release tablet       . potassium chloride (KLOR-CON) 20 MEQ packet Take 20 mEq  by mouth daily.       . traMADol (ULTRAM) 50 MG tablet        No current facility-administered medications for this visit.   Facility-Administered Medications Ordered in Other Visits  Medication Dose Route Frequency Provider Last Rate Last Dose  . [COMPLETED] 0.9 %  sodium chloride infusion   Intravenous Once Lowella Dell, MD      . [address] acetaminophen (TYLENOL) tablet 650 mg  650 mg Oral Once Lowella Dell, MD   650 mg at 05/25/12 1007  . fulvestrant (FASLODEX) injection 500 mg  500 mg Intramuscular Q30 days Lowella Dell, MD   500 mg at 05/25/12 1128  . [COMPLETED] heparin lock flush 100 unit/mL  500 Units Intracatheter Once PRN Lowella Dell, MD   500 Units at 05/25/12 1125  . sodium chloride 0.9 % injection 10 mL  10 mL Intracatheter PRN Lowella Dell, MD   10 mL at 05/25/12 1124  . [COMPLETED] trastuzumab (HERCEPTIN) 672 mg in sodium chloride 0.9 % 250 mL chemo infusion  8 mg/kg (Order-Specific) Intravenous Once Lowella Dell, MD   672 mg at 05/25/12 1038     OBJECTIVE: Middle-aged white woman who appears comfortable and in no acute distress.  Filed Vitals:   05/25/12 0849  BP: 150/78  Pulse: 66  Temp: 98 F (36.7 C)  Resp: 20     Body mass index is 31.34 kg/(m^2).    ECOG FS: 1 Filed Weights   05/25/12 0849  Weight: 182 lb 9.6 oz (82.827 kg)   Physical Exam: HEENT:  Sclerae anicteric.  Oropharynx clear.     Nodes:  Palpable node in the left anterior cervical region, measuring approximately 1-1.5cm.  Also with notable fullness and a palpable node in the left supraclavicular region.  Right nodes were benign. Breast Exam:  Deferred. No axillary adenopathy  Lungs:  Clear to auscultation bilaterally.  No crackles, rhonchi, or wheezes.   Heart:  Regular rate and rhythm.   Abdomen:  Soft, nontender.  Positive bowel sounds.  Musculoskeletal:  No focal spinal tenderness to palpation.  Extremities:   nonpitting edema in the right ankle. No additional  peripheral edema noted   Neuro:  Nonfocal. Alert and oriented x3.    LAB RESULTS:  Lab Results  Component Value Date   WBC 5.1 05/25/2012   NEUTROABS 2.8 05/25/2012   HGB 11.0* 05/25/2012   HCT 33.4* 05/25/2012  MCV 87.2 05/25/2012   PLT 280 05/25/2012      Chemistry      Component Value Date/Time   NA 139 05/25/2012 0808   NA 136 02/01/2012 0500   NA 142 11/05/2010 1036   K 4.5 05/25/2012 0808   K 4.5 02/01/2012 0500   K 4.5 11/05/2010 1036   CL 106 05/25/2012 0808   CL 102 02/01/2012 0500   CL 99 11/05/2010 1036   CO2 24 05/25/2012 0808   CO2 25 02/01/2012 0500   CO2 26 11/05/2010 1036   BUN 17.0 05/25/2012 0808   BUN 13 02/01/2012 0500   BUN 14 11/05/2010 1036   CREATININE 0.9 05/25/2012 0808   CREATININE 0.97 02/01/2012 0500   CREATININE 0.8 11/05/2010 1036      Component Value Date/Time   CALCIUM 9.0 05/25/2012 0808   CALCIUM 8.6 02/01/2012 0500   CALCIUM 8.8 11/05/2010 1036   ALKPHOS 114 05/25/2012 0808   ALKPHOS 88 01/08/2012 0916   ALKPHOS 106* 11/05/2010 1036   AST 18 05/25/2012 0808   AST 24 01/08/2012 0916   AST 27 11/05/2010 1036   ALT 21 05/25/2012 0808   ALT 27 01/08/2012 0916   BILITOT 0.72 05/25/2012 0808   BILITOT 0.9 01/08/2012 0916   BILITOT 0.80 11/05/2010 1036       Lab Results  Component Value Date   LABCA2 51* 04/27/2012    STUDIES: Echocardiogram 05/21/2012 continues to show a well preserved ejection fraction of 55 - 60 %.   ASSESSMENT: 61 year old [place] woman with stage IV breast cancer.  1. 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 2. Status post 4 cycles of adjuvant paclitaxel, carboplatin and trastuzumab completed November 2007. 3. Postmastectomy radiation completed February 2008. 4. Anastrozole begun February 2008 (the trastuzumab also was continued to complete a year), continued until June 2012.  5. 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  6. On tamoxifen/lapatinib/trastuzumab between June 2012 and October 2012.  7. Ixempra/lapatinib/trastuzumab started October 2012, the Ixempra discontinued in February 2013 due to peripheral neuropathy. 8. Continuing on lapatinib/trastuzumab with the addition of fulvestrant, first injections given on 07/31/2011. Lapatinib given at a dose of 1 g (4 tablets) daily, and trastuzumab given at 8 mg/kg every 4 weeks to coordinate with injection appointments.  PLAN:  Ziyonna will proceed to treatment today as scheduled for both fulvestrant and trastuzumab, and she will continue on her lapatinib as well. We are going to go ahead and restage Claretha with CTs of the neck, chest, abdomen, and pelvis in addition to a PET scan. I will try to order those later this week, and she will see [name] next week on December 17 to review those results.   All this was reviewed in detail with Bonita Quin today who voices understanding and agreement with this plan. She'll call should any changes or problems.    Avon Molock    05/25/2012

## 2012-05-29 ENCOUNTER — Encounter (HOSPITAL_COMMUNITY)
Admission: RE | Admit: 2012-05-29 | Discharge: 2012-05-29 | Disposition: A | Discharge status: Home / Self Care | Payer: Medicare Other | Source: Ambulatory Visit | Attending: Physician Assistant | Admitting: Physician Assistant

## 2012-05-29 ENCOUNTER — Ambulatory Visit (HOSPITAL_COMMUNITY)
Admission: RE | Admit: 2012-05-29 | Discharge: 2012-05-29 | Disposition: A | Discharge status: Home / Self Care | Payer: Medicare Other | Source: Ambulatory Visit | Attending: Physician Assistant | Admitting: Physician Assistant

## 2012-05-29 ENCOUNTER — Encounter (HOSPITAL_COMMUNITY): Payer: Self-pay

## 2012-05-29 DIAGNOSIS — K7689 Other specified diseases of liver: Secondary | ICD-10-CM | POA: Insufficient documentation

## 2012-05-29 DIAGNOSIS — C801 Malignant (primary) neoplasm, unspecified: Secondary | ICD-10-CM | POA: Insufficient documentation

## 2012-05-29 DIAGNOSIS — Z9089 Acquired absence of other organs: Secondary | ICD-10-CM | POA: Insufficient documentation

## 2012-05-29 DIAGNOSIS — R599 Enlarged lymph nodes, unspecified: Secondary | ICD-10-CM | POA: Insufficient documentation

## 2012-05-29 DIAGNOSIS — C50919 Malignant neoplasm of unspecified site of unspecified female breast: Secondary | ICD-10-CM | POA: Insufficient documentation

## 2012-05-29 DIAGNOSIS — J90 Pleural effusion, not elsewhere classified: Secondary | ICD-10-CM | POA: Insufficient documentation

## 2012-05-29 DIAGNOSIS — Z9010 Acquired absence of unspecified breast and nipple: Secondary | ICD-10-CM | POA: Insufficient documentation

## 2012-05-29 DIAGNOSIS — E279 Disorder of adrenal gland, unspecified: Secondary | ICD-10-CM | POA: Insufficient documentation

## 2012-05-29 LAB — GLUCOSE, CAPILLARY: Glucose-Capillary: 97 mg/dL (ref 70–99)

## 2012-05-29 MED ORDER — IOHEXOL 300 MG/ML  SOLN
125.0000 mL | Freq: Once | INTRAMUSCULAR | Status: AC | PRN
  Administered 2012-05-29: 125 mL via INTRAVENOUS

## 2012-05-29 MED ORDER — FLUDEOXYGLUCOSE F - 18 (FDG) INJECTION
19.2000 | Freq: Once | INTRAVENOUS | Status: AC | PRN
  Administered 2012-05-29: 19.2 via INTRAVENOUS

## 2012-06-01 ENCOUNTER — Other Ambulatory Visit (HOSPITAL_COMMUNITY): Payer: Medicare Other

## 2012-06-01 ENCOUNTER — Encounter (HOSPITAL_COMMUNITY): Payer: Medicare Other

## 2012-06-02 ENCOUNTER — Telehealth: Payer: Self-pay | Admitting: *Deleted

## 2012-06-02 ENCOUNTER — Ambulatory Visit (HOSPITAL_BASED_OUTPATIENT_CLINIC_OR_DEPARTMENT_OTHER): Payer: Medicare Other | Admitting: Oncology

## 2012-06-02 VITALS — BP 161/83 | HR 57 | Temp 97.9°F | Resp 20 | Ht 64.0 in | Wt 183.0 lb

## 2012-06-02 DIAGNOSIS — C7970 Secondary malignant neoplasm of unspecified adrenal gland: Secondary | ICD-10-CM

## 2012-06-02 DIAGNOSIS — C778 Secondary and unspecified malignant neoplasm of lymph nodes of multiple regions: Secondary | ICD-10-CM

## 2012-06-02 DIAGNOSIS — C50919 Malignant neoplasm of unspecified site of unspecified female breast: Secondary | ICD-10-CM

## 2012-06-02 DIAGNOSIS — Z170 Estrogen receptor positive status [ER+]: Secondary | ICD-10-CM

## 2012-06-02 NOTE — Progress Notes (Signed)
ID: Rebecca Pugh   DOB: April 16, 1951  MR#: [id number]  WUJ#:[id number]  PCP: Rebecca Bur, MD GYN: Rebecca Pugh OTHER MD:  HISTORY OF PRESENT ILLNESS: The patient and her husband, Rebecca Pugh, moved back to this area recently from [facility] (Rebecca Pugh actually is a native of [place] and incidentally remains a [employer] fan).  As part of moving boxes, [name] felt something under her left arm.  She brought it to Dr. Lovena Pugh attention and although the patient states that she has always had some cyclical swelling of her left axilla during menstruation, Dr. Lalla Pugh set the patient up for evaluation at [facility].  On 12-12-05 the patient had a mammogram and ultrasound there and these showed no mass, distortion or microcalcifications in either breast however, in the left axilla there was a large macrolobulated mass and just above that there was a slightly prominent lymph node which did demonstrate a fatty hilum.  Physically on exam the mass was mobile and nontender and measured approximately 6.5 cm.  The ultrasound showed two small hypoechoic nodules in the left breast 3 cm. from the nipple measuring 7 mm. each and felt to be most likely benign however, the axillary mass, of course, was quite suspicious and biopsy was performed the same day.  This showed ([id number] and [id number]) invasive breast cancer which was strongly ER positive at 76%, moderately PR positive at 25% with a very high proliferation marker at 66%.  HER-2/neu was 1+ and FISH showed no amplification with a ratio of 1.1.    With this information, the patient was referred to Dr. Claud Pugh and on 12-20-05 the patient had bilateral breast MRI's.  In the left breast there was a 5.8 cm. axillary mass and several abnormally enlarged lymph nodes.  There was a 7 mm. left supraclavicular lymph node and the two nodules in the breast that had been noted by ultrasound were also noted by MRI measuring 1.1 and 0.7 cm.  In the right breast there were no  abnormalities noted.  MR guided biopsies of the left breast masses were obtained on 12-27-05 and both showed high grade ductal carcinoma.  There was evidence of lymphovascular invasion and high grade ductal carcinoma in situ as well ([id number]).    Dr. Derrell Rebecca Pugh discussed the situation with me and we felt it would be useful to have a PET scan prior to definitive surgery.  This was obtained on 12-30-05 and showed the primary axillary mass to have an SUV of 8.5.  The lymph nodes in the left axilla had SUV's in the 2.2 range, small focus of increased activity in the left breast had an SUV of 2.3.  There was also skin thickening with mildly increased diffuse FDG uptake within the skin suggesting an inflammatory breast cancer.  The neck, abdomen and  pelvis were negative.  In the lung windows, there were several scattered small areas of focal nodular density in the upper lobes with compressive atelectasis and this was felt to be benign.  Chest x-ray on 01-09-06 was negative.  With this information, Dr. Derrell Rebecca Pugh proceeded, after appropriate discussion, to left modified radical mastectomy on 01-15-06 and the insertion of a BARD port at the same time.  The final pathology ([id number]) showed the maximum tumor size to be 5 cm.  Margins were ample.  There was evidence of lymphovascular invasion.  This infiltrating ductal carcinoma was a grade 3 of 3 and 11 out of 14 lymph nodes were involved.  There was evidence of extracapsular extension.  Her subsequent history is as detailed below.   INTERVAL HISTORY: Rebecca Pugh returns today with her husband Rebecca Pugh for followup of her metastatic breast carcinoma. Since her last visit here she had noted a "bump" in her left neck. She brought it to the attention of her cardiologist's physician's assistant, and we were alerted. We obtained staging studies with CT scans and a PET scan. These show progressive disease.  REVIEW OF SYSTEMS: Rebecca Pugh fractured her right foot in August and has not been  able to walk very much since then. She is accordingly tired and a bit deconditioned. She continues to have diarrhea problems from the lapatinib, alternating with constipation from the Imodium. She's had some queasiness but no nausea or vomiting, no headaches, no visual changes, no vertigo or dizziness. 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 There have been no fever, rash, bleeding, unusual weight loss, or other systemic symptoms. She sometimes has a dull pain in her left flank which is relieved by defecation. A detailed review of systems was otherwise stable  PAST MEDICAL HISTORY: Past Medical History  Diagnosis Date  . Hypertension   . Depression   . breast ca dx'd 01/15/2006    chemo/xrt comp  remote tonsillectomy, remote cholecystectomy, status post appendectomy, multiple skin biopsies for what the patient said were simple keratoses, and status post port placement.  FAMILY HISTORY: The patient has no information regarding her father. She was brought up by her stepfather. The patient's mother died at the age of 45 with bladder cancer. The patient has one sister who is in good health. There is no history of breast or ovarian cancer in the family to the patient's knowledge.   GYNECOLOGIC HISTORY: She is GX, P2. She was premenopausal at the time of her initial breast cancer diagnosis  SOCIAL HISTORY: She and her husband, Rebecca Pugh, have been married 10 years. 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 She has two children, a daughter, Rebecca Pugh, who lives in [place] and is a Investment banker, corporate, and a son, Rebecca Pugh, also in [place], who works for [employer]. She has two grandchildren and three step-grandchildren from her own two children, [name] and Rebecca Pugh, who are from her first marriage. Rebecca Pugh has one daughter from his first marriage and he has grandchildren through her. The patient was  brought up as a Catholic but currently is not practicing. She and Rebecca Pugh are attending a [employer].      ADVANCED DIRECTIVES:  HEALTH MAINTENANCE: History  Substance Use Topics  . Smoking status: Former Smoker -- 20 years    Quit date: 01/31/1995  . Smokeless tobacco: Never Used  . Alcohol Use: Yes     Comment: occasionl     Colonoscopy:  PAP:  Bone density: August 2012, "Normal"  Lipid panel:  No Known Allergies  Current Outpatient Prescriptions  Medication Sig Dispense Refill  . calcium citrate (CALCITRATE - DOSED IN MG ELEMENTAL CALCIUM) 950 MG tablet Take 1 tablet by mouth daily.        . cholecalciferol (VITAMIN D) 1000 UNITS tablet Take 3,000 Units by mouth daily.        Marland Kitchen FLUoxetine (PROZAC) 20 MG capsule TAKE ONE CAPSULE BY MOUTH EVERY DAY  30 capsule  3  . lapatinib (TYKERB) 250 MG tablet Take 3 tablets (750 mg total) by mouth daily.  270 tablet  3  . loperamide (IMODIUM) 2 MG capsule  Take 2 mg by mouth 4 (four) times daily as needed. diarrhea      . LORazepam (ATIVAN) 0.5 MG tablet Take 1 tablet (0.5 mg total) by mouth 2 (two) times daily as needed.  30 tablet  0  . losartan (COZAAR) 50 MG tablet Take 1 tablet (50 mg total) by mouth daily.  30 tablet  6  . oxyCODONE (OXY IR/ROXICODONE) 5 MG immediate release tablet       . potassium chloride (KLOR-CON) 20 MEQ packet Take 20 mEq by mouth daily.       . traMADol (ULTRAM) 50 MG tablet          OBJECTIVE: Middle-aged white woman who was tearful today  Filed Vitals:   06/02/12 1137  BP: 161/83  Pulse: 57  Temp: 97.9 F (36.6 C)  Resp: 20     Body mass index is 31.41 kg/(m^2).    ECOG FS: 1 Filed Weights   06/02/12 1137  Weight: 183 lb (83.008 kg)   Physical Exam: HEENT:  Sclerae anicteric.  Oropharynx clear.     Nodes:  There is a hard nontender slightly movable lymph node in the left anterior neck, measuring 1-1/2 cm. Breast Exam:  Deferred. No axillary adenopathy  Lungs:  Clear to auscultation  bilaterally.  No crackles, rhonchi, or wheezes. Heart:  Regular rate and rhythm.   Abdomen:  Soft, nontender.  Positive bowel sounds. No left flank tenderness Musculoskeletal:  No focal spinal tenderness  Extremities:  Minimal nonpitting edema in the right ankle as previously noted  Neuro:  Nonfocal. Alert and oriented x3.    LAB RESULTS:  Lab Results  Component Value Date   WBC 5.1 05/25/2012   NEUTROABS 2.8 05/25/2012   HGB 11.0* 05/25/2012   HCT 33.4* 05/25/2012   MCV 87.2 05/25/2012   PLT 280 05/25/2012      Chemistry      Component Value Date/Time   NA 139 05/25/2012 0808   NA 136 02/01/2012 0500   NA 142 11/05/2010 1036   K 4.5 05/25/2012 0808   K 4.5 02/01/2012 0500   K 4.5 11/05/2010 1036   CL 106 05/25/2012 0808   CL 102 02/01/2012 0500   CL 99 11/05/2010 1036   CO2 24 05/25/2012 0808   CO2 25 02/01/2012 0500   CO2 26 11/05/2010 1036   BUN 17.0 05/25/2012 0808   BUN 13 02/01/2012 0500   BUN 14 11/05/2010 1036   CREATININE 0.9 05/25/2012 0808   CREATININE 0.97 02/01/2012 0500   CREATININE 0.8 11/05/2010 1036      Component Value Date/Time   CALCIUM 9.0 05/25/2012 0808   CALCIUM 8.6 02/01/2012 0500   CALCIUM 8.8 11/05/2010 1036   ALKPHOS 114 05/25/2012 0808   ALKPHOS 88 01/08/2012 0916   ALKPHOS 106* 11/05/2010 1036   AST 18 05/25/2012 0808   AST 24 01/08/2012 0916   AST 27 11/05/2010 1036   ALT 21 05/25/2012 0808   ALT 27 01/08/2012 0916   BILITOT 0.72 05/25/2012 0808   BILITOT 0.9 01/08/2012 0916   BILITOT 0.80 11/05/2010 1036       Lab Results  Component Value Date   LABCA2 65* 05/25/2012    STUDIES: Echocardiogram 05/21/2012 continues to show a well preserved ejection fraction of 55 - 60 %. Ct Soft Tissue Neck W Contrast  05/29/2012  *RADIOLOGY REPORT*  Clinical Data: Breast cancer.  CT NECK WITH CONTRAST  Technique:  Multidetector CT imaging of the neck was performed with intravenous contrast.  Contrast: OMNIPAQUE IOHEXOL 300 MG/ML  SOLN  Comparison: PET CT from today  and 11/05/2010  Findings: Multiple enlarged lymph nodes are present in the left lower neck.  These are hypermetabolic on PET and are compatible with metastatic disease.  These are located in the supraclavicular fossa.  The largest lymph node measures 18 x 20 mm.  Just lateral to this is a lymph node measuring 11 x 15 mm.  There is a 15 mm node behind the left clavicle.  Left superior mediastinal lymph node measures 18 mm.  Multiple smaller nodes are also present lateral to the jugular vein which are hypermetabolic.  Left level II lymph node measures 8 x 9 mm and is not hypermetabolic and may be reactive.  No pathologic adenopathy in the right neck.  Thyroid gland is not enlarged.  The epiglottis is normal.  Parotid and submandibular glands are normal.  Mild cervical degenerative change without lytic bone lesion.  IMPRESSION: Abnormal lymph nodes in the left lower neck compatible with metastatic disease.  No enlarged lymph nodes are present in the right neck.   Original Report Authenticated By: Janeece Riggers, M.D.    Ct Chest W Contrast  05/29/2012  *RADIOLOGY REPORT*  Clinical Data:  Metastatic breast cancer.  Evaluate for progression of disease.  CT CHEST, ABDOMEN AND PELVIS WITH CONTRAST  Technique:  Multidetector CT imaging of the chest, abdomen and pelvis was performed following the standard protocol during bolus administration of intravenous contrast.  Contrast: OMNIPAQUE IOHEXOL 300 MG/ML  SOLN  Comparison:  Multiple priors, most recently a CT of the chest abdomen and pelvis 07/22/2011.  CT CHEST  Findings:  Mediastinum: Multiple enlarged lower left cervical lymph nodes, as discussed in the PET-CT examination from today.  Enlarged superior mediastinal lymph node measuring up to 1.5 cm in short axis immediately lateral to the proximal left common carotid artery. Enlarged posterior mediastinal lymph nodes measuring up to 23 mm in short axis, with extensive retrocrural lymphadenopathy as well.  No hilar  lymphadenopathy. Heart size is normal. There is no significant pericardial fluid, thickening or pericardial calcification.  Right internal jugular single lumen Port-A-Cath with tip terminating in the superior cavoatrial junction. Esophagus is unremarkable in appearance.  Lungs/Pleura: Subpleural reticulation throughout the anterior aspect of the left upper lobe, most compatible with some post radiation scarring.  No definite suspicious appearing pulmonary nodules or masses are identified on today's examination.  Small left pleural effusion layering dependently.  Musculoskeletal: Status post left-sided modified radical mastectomy and left axillary nodal dissection.  No definite soft tissue mass or left axillary adenopathy to suggest local recurrence of disease. There are no aggressive appearing lytic or blastic lesions noted in the visualized portions of the skeleton.  IMPRESSION:  1.  Findings compatible with progression of metastatic disease with mediastinal and left supraclavicular lymphadenopathy, as above. 2.  Status post left modified radical mastectomy and axillary nodal dissection, without evidence of local recurrence of disease. 3.  Additional incidental findings, as above.  CT ABDOMEN AND PELVIS  Findings:  Abdomen/Pelvis: Significant interval enlargement of heterogeneous left adrenal mass which currently measures approximately 7.1 x 5.4 cm.  This mass extends into the paraaortic space encapsulating much of the left renal hilar structures, including the left renal artery and vein.  The left renal artery remains patent at this time.  The vein is poorly visualized.  Extensive periaortic lymphadenopathy has significantly increased compared to the prior examination, with the largest node or nodal mass measuring at  least 6.9 by 3.5 cm immediately posterior to the infrarenal inferior vena cava.  Many of these nodes are completely confluent with one another and at times completely encase the infrarenal abdominal  aorta.  As discussed on the PET CT portion of the examination, this lymphadenopathy extends into the pelvis where there are enlarged right common iliac lymph nodes measuring up to 11 mm in short axis.  Status post cholecystectomy.  2.6 x 2.2 cm lesion in the inferior aspect of segment 5 of the liver demonstrates peripheral nodular enhancement with progressive central filling on delayed images, compatible with a cavernous hemangioma.  A small hypervascular lesion measuring 11 mm in segment 8 (image 50 of series 2) also likely represents a small flash-fill cavernous hemangioma, as does a 7 mm hypervascular lesion in segment 2 of the liver (image 44 of series 2).  The appearance of the pancreas, spleen, right adrenal gland and right kidney is unremarkable. There is some left-sided perinephric stranding and thickening of Gerota's fascia on the left.  Some collateral vessels are also seen in this region, likely to represent collateral venous outflow for the left kidney (the left renal vein is poorly visualized and may be occluded).  These collateral vessels anastomose with the left gonadal veins and likely provide retrograde flow to the left ovarian veins and other pelvic vessels.  No ascites or pneumoperitoneum and no pathologic distension of small bowel.  The uterus and bilateral ovaries are unremarkable in appearance.  Numerous pelvic phleboliths are noted.  Urinary bladder is unremarkable in appearance.  Musculoskeletal: There are no aggressive appearing lytic or blastic lesions noted in the visualized portions of the skeleton.  IMPRESSION:  1.  Findings, as above, compatible with significant progression of metastatic disease with significant interval enlargement of left adrenal mass and extensive retroperitoneal lymphadenopathy. Notably, the left adrenal mass and retroperitoneal lymphadenopathy completely encases the left renal hilar vessels.  Although the left renal artery appears patent, the left renal vein is not  confidently identified, and there are numerous venous collaterals extending to anastomose to the left gonadal and ovarian veins, as above.  There may be complete occlusion of the left renal vein at this time. 2.  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 3.  Status post cholecystectomy.   Original Report Authenticated By: Trudie Reed, M.D.    Ct Abdomen Pelvis W Contrast  05/29/2012  *RADIOLOGY REPORT*  Clinical Data:  Metastatic breast cancer.  Evaluate for progression of disease.  CT CHEST, ABDOMEN AND PELVIS WITH CONTRAST  Technique:  Multidetector CT imaging of the chest, abdomen and pelvis was performed following the standard protocol during bolus administration of intravenous contrast.  Contrast: OMNIPAQUE IOHEXOL 300 MG/ML  SOLN  Comparison:  Multiple priors, most recently a CT of the chest abdomen and pelvis 07/22/2011.  CT CHEST  Findings:  Mediastinum: Multiple enlarged lower left cervical lymph nodes, as discussed in the PET-CT examination from today.  Enlarged superior mediastinal lymph node measuring up to 1.5 cm in short axis immediately lateral to the proximal left common carotid artery. Enlarged posterior mediastinal lymph nodes measuring up to 23 mm in short axis, with extensive retrocrural lymphadenopathy as well.  No hilar lymphadenopathy. Heart size is normal. There is no significant pericardial fluid, thickening or pericardial calcification.  Right internal jugular single lumen Port-A-Cath with tip terminating in the superior cavoatrial junction. Esophagus is unremarkable in appearance.  Lungs/Pleura: Subpleural reticulation throughout the anterior aspect of the left upper lobe, most  compatible with some post radiation scarring.  No definite suspicious appearing pulmonary nodules or masses are identified on today's examination.  Small left pleural effusion layering dependently.  Musculoskeletal: Status post left-sided modified radical  mastectomy and left axillary nodal dissection.  No definite soft tissue mass or left axillary adenopathy to suggest local recurrence of disease. There are no aggressive appearing lytic or blastic lesions noted in the visualized portions of the skeleton.  IMPRESSION:  1.  Findings compatible with progression of metastatic disease with mediastinal and left supraclavicular lymphadenopathy, as above. 2.  Status post left modified radical mastectomy and axillary nodal dissection, without evidence of local recurrence of disease. 3.  Additional incidental findings, as above.  CT ABDOMEN AND PELVIS  Findings:  Abdomen/Pelvis: Significant interval enlargement of heterogeneous left adrenal mass which currently measures approximately 7.1 x 5.4 cm.  This mass extends into the paraaortic space encapsulating much of the left renal hilar structures, including the left renal artery and vein.  The left renal artery remains patent at this time.  The vein is poorly visualized.  Extensive periaortic lymphadenopathy has significantly increased compared to the prior examination, with the largest node or nodal mass measuring at least 6.9 by 3.5 cm immediately posterior to the infrarenal inferior vena cava.  Many of these nodes are completely confluent with one another and at times completely encase the infrarenal abdominal aorta.  As discussed on the PET CT portion of the examination, this lymphadenopathy extends into the pelvis where there are enlarged right common iliac lymph nodes measuring up to 11 mm in short axis.  Status post cholecystectomy.  2.6 x 2.2 cm lesion in the inferior aspect of segment 5 of the liver demonstrates peripheral nodular enhancement with progressive central filling on delayed images, compatible with a cavernous hemangioma.  A small hypervascular lesion measuring 11 mm in segment 8 (image 50 of series 2) also likely represents a small flash-fill cavernous hemangioma, as does a 7 mm hypervascular lesion in  segment 2 of the liver (image 44 of series 2).  The appearance of the pancreas, spleen, right adrenal gland and right kidney is unremarkable. There is some left-sided perinephric stranding and thickening of Gerota's fascia on the left.  Some collateral vessels are also seen in this region, likely to represent collateral venous outflow for the left kidney (the left renal vein is poorly visualized and may be occluded).  These collateral vessels anastomose with the left gonadal veins and likely provide retrograde flow to the left ovarian veins and other pelvic vessels.  No ascites or pneumoperitoneum and no pathologic distension of small bowel.  The uterus and bilateral ovaries are unremarkable in appearance.  Numerous pelvic phleboliths are noted.  Urinary bladder is unremarkable in appearance.  Musculoskeletal: There are no aggressive appearing lytic or blastic lesions noted in the visualized portions of the skeleton.  IMPRESSION:  1.  Findings, as above, compatible with significant progression of metastatic disease with significant interval enlargement of left adrenal mass and extensive retroperitoneal lymphadenopathy. Notably, the left adrenal mass and retroperitoneal lymphadenopathy completely encases the left renal hilar vessels.  Although the left renal artery appears patent, the left renal vein is not confidently identified, and there are numerous venous collaterals extending to anastomose to the left gonadal and ovarian veins, as above.  There may be complete occlusion of the left renal vein at this time. 2.  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 3.  Status post cholecystectomy.   Original Report  Authenticated By: Trudie Reed, M.D.    Nm Pet Image Restag (ps) Skull Base To Thigh  05/29/2012  **ADDENDUM** CREATED: 05/29/2012 16:22:27  After review of the contemporaneous contrast enhanced CT of the chest, abdomen and pelvis, there does not appear to be  frank left- sided hydroureteronephrosis as was initially suspected on the PET- CT portion of the examination.  There is minimal fullness of the left renal collecting system, but no delayed excretion of contrast material by the left kidney to suggest frank urinary tract obstruction at this time.  **END ADDENDUM** SIGNED BY: Florencia Reasons, M.D.   05/29/2012  *RADIOLOGY REPORT*  Clinical Data: On subsequent treatment strategy for metastatic breast cancer. Restaging scan  NUCLEAR MEDICINE PET SKULL BASE TO THIGH  Fasting Blood Glucose:  97  Technique:  19.2 mCi F-18 FDG was injected intravenously. CT data was obtained and used for attenuation correction and anatomic localization only.  (This was not acquired as a diagnostic CT examination.) Additional exam technical data entered on technologist worksheet.  Comparison:  Head CT 11/05/2010.  Findings:  Neck: There are numerous enlarged and hypermetabolic lower left cervical lymph nodes.  Specifically, several level Vb nodes are noted measuring up to 1.5 cm in short axis (SUVmax = 8.7).  There are also some enlarged left supraclavicular lymph nodes measuring up to 1.2 cm in short axis (SUVmax = 5.6).  Chest:  Many superior mediastinal lymph nodes are noted, with the largest immediately lateral to the proximal left common carotid artery measuring up to 1.5 cm in short axis (SUVmax = 7.8).Numerous posterior mediastinal lymph nodes are noted adjacent to the thoracic spine, largest of which is right retrocrural measuring up to 23 mm (SUVmax = 6.6-10.7).  Small left pleural effusion layering dependently. Subpleural reticulation throughout the anterior aspect of the left upper lobe, likely secondary to prior radiation therapy.  No definite suspicious appearing pulmonary nodules or masses are identified today's examination within the lungs.  No acute consolidative airspace disease.  Right internal jugular single lumen Port-A-Cath with tip terminating in the superior  cavoatrial junction. Postoperative changes of modified radical left- sided mastectomy and left axillary nodal dissection, without evidence of focal soft tissue mass or lymphadenopathy to suggest local recurrence of disease.  Abdomen/Pelvis:  There has been significant interval enlargement of extensive retroperitoneal lymphadenopathy and enlargement of left adrenal mass compared to the prior examination.  The left adrenal mass currently measures approximately 7.1 x 5.4 cm and is markedly hypermetabolic (SUVmax = 14.4).  Extensive bilateral paraaortic lymphadenopathy has significantly increased, with conglomerate nodal masses measuring up to 6.6 x 3.5 cm with malignant range metabolic activity (SUVmax = 9.3 - 14.0).  This retroperitoneal lymphadenopathy extends to the pelvis where there is also right common iliac lymph node enlargement measuring up to 1 cm (SUVmax = 7.4).  Diffusely decreased attenuation throughout the hepatic parenchyma, suggestive of hepatic steatosis.  Status post cholecystectomy.  The unenhanced appearance of the pancreas, spleen, right adrenal gland and right kidney is unremarkable.  There is perinephric stranding around the left kidney, mild thickening of Gerota's fascia on the left, and mild left hydroureteronephrosis involving the proximal third of the left ureter, suggesting some extrinsic compression of the left ureter related to the left retroperitoneal lymphadenopathy.  No ascites or pneumoperitoneum and no pathologic distension of small bowel.  Numerous pelvic phleboliths are incidentally noted.  Skeleton:  No focal hypermetabolic activity to suggest skeletal metastasis.  IMPRESSION: 1.  Significant progression of disease, as discussed above, most  notably with worsening retroperitoneal lymphadenopathy, enlarging left adrenal mass, increasing mediastinal adenopathy, and new left cervical adenopathy. 2.  At this time, there appears to be some extrinsic compression of the left ureter  resulting in some mild left proximal hydroureteronephrosis. 3.  Hepatic steatosis. 4.  Additional incidental findings, as above.  Original Report Authenticated By: Trudie Reed, M.D.     ASSESSMENT: 61 year old [facility] woman with stage IV breast cancer.  1. 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 2. Status post 4 cycles of adjuvant docetaxel, carboplatin and trastuzumab completed November 2007. 3. Postmastectomy radiation completed February 2008. 4. Anastrozole begun February 2008 (the trastuzumab also was continued to complete a year), continued until June 2012.  5. 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  6. On tamoxifen/lapatinib/trastuzumab between June 2012 and October 2012.  7. Ixempra/lapatinib/trastuzumab started October 2012, the Ixempra discontinued in February 2013 due to peripheral neuropathy. 8. Continuing on lapatinib/trastuzumab with the addition of fulvestrant, first injections given on 07/31/2011. Lapatinib given at a dose of 1 g (4 tablets) daily, and trastuzumab given at 8 mg/kg every 4 weeks to coordinate with injection appointments.  PLAN: We went over her scans in detail and I also gave her copies for her to read at home. Basically we are seeing significant disease progression in lymph nodes and the left adrenal gland, now encasing the left kidney. Clearly we need to change therapy, and today we are stopping her lapatinib and Faslodex.  We have many options including combining trastuzumab with pertuzumab and docetaxel which was my suggestion; if that does not work we can consider TDM-1; she has not had anthracyclines cyclophosphamide or gemcitabine. It was reassuring to her that we do have many options to treat her although she understands we do not not a cure her disease at  present.  She will start the Herceptin/trastuzumab/docetaxel December 30. She will see [place] again December 27 2 go over the pain meds, including the dexamethasone she will need to take the day before treatment. I am not going to use Neulasta at least for the first cycle, but follow her counts and add Neulasta if necessary. She will see me again early January to discuss tolerance of cycle #1. If she does well at the plan is to go 4-6 cycles and then restage.     MAGRINAT,GUSTAV C    06/02/2012

## 2012-06-02 NOTE — Telephone Encounter (Signed)
Per staff message and POF I have scheduled appts.  JMW  

## 2012-06-02 NOTE — Telephone Encounter (Signed)
Gave patient appointment for 06-12-2012  [facility] email to set up patient's treatment

## 2012-06-07 NOTE — Progress Notes (Signed)
Patient ID: Rebecca Pugh, female   DOB: 01-25-1951, 61 y.o.   MRN: [id number] Referring Physician: [name] Primary Care: [name]  Primary Cardiologist: none  HPI: Rebecca Pugh is a 61 y.o. female with history of stage IV breast cancer initially diagnosed in 2007.  She underwent 4 cycles of adjuvant paclitaxel/carboplatin/trastuzumab (completed 04/2006) and trastuzumab continued for 1 year.  She had postmastectomy radiation completed 07/2006.  Anastrozole began in 07/2006.  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  Started on tamoxifen, lapatinib, and trastuzumab between June and October of 2012, with progression.  She is now on ixabepilone, lapatinib, and trastuzumab beginning November of 2012, with fair tolerance.  She has completed four cycles of ixabepilone/lapatinib and will remain on herceptin indefinitely.    Echos: 11/2010: EF 60-65% with poor windows to evaluate lateral S' 03/2011: EF60-65% with lateral S' peaks of 10.1 07/10/11: EF 55-60% lateral s' 9.6 10/08/11: EF 60%, lateral s' 9.6 01/09/12: EF 60-65%, lateral s' 11.5, 11.08 05/21/12: EF 60%, lateral s' 10.9  She returns for follow up today.  She continues on herceptin q3 weeks.  She denies SOB/orthopnea/PND or edema.  Complains of R foot pain due to healing fracture. She denies CP/syncope.   Review of Systems: All pertinent positives and negatives as in HPI, otherwise negative.     Past Medical History  Diagnosis Date  . Hypertension   . Depression   . breast ca dx'd 01/15/2006    chemo/xrt comp    Current Outpatient Prescriptions  Medication Sig Dispense Refill  . calcium citrate (CALCITRATE - DOSED IN MG ELEMENTAL  CALCIUM) 950 MG tablet Take 1 tablet by mouth daily.        . cholecalciferol (VITAMIN D) 1000 UNITS tablet Take 3,000 Units by mouth daily.        Marland Kitchen FLUoxetine (PROZAC) 20 MG capsule TAKE ONE CAPSULE BY MOUTH EVERY DAY  30 capsule  3  . lapatinib (TYKERB) 250 MG tablet Take 3 tablets (750 mg total) by mouth daily.  270 tablet  3  . losartan (COZAAR) 50 MG tablet Take 1 tablet (50 mg total) by mouth daily.  30 tablet  6  . potassium chloride (KLOR-CON) 20 MEQ packet Take 20 mEq by mouth daily.       Marland Kitchen loperamide (IMODIUM) 2 MG capsule Take 2 mg by mouth 4 (four) times daily as needed. diarrhea      . LORazepam (ATIVAN) 0.5 MG tablet Take 1 tablet (0.5 mg total) by mouth 2 (two) times daily as needed.  30 tablet  0  . oxyCODONE (OXY IR/ROXICODONE) 5 MG immediate release tablet       . traMADol (ULTRAM) 50 MG tablet         No Known Allergies   PHYSICAL EXAM: Filed Vitals:   05/21/12 1138  BP: 148/64  Pulse: 80  Weight: 177 lb 6.4 oz (80.468 kg)  SpO2: 96%   General:  Well appearing. No respiratory difficulty HEENT: L neck  Neck: supple. no JVD. Carotids 2+ bilat; no bruits. No lymphadenopathy or thryomegaly appreciated. Cor: PMI nondisplaced. Regular rate & rhythm. No rubs, gallops or murmurs. Lungs: clear Abdomen: soft, nontender, nondistended. No hepatosplenomegaly. No bruits or masses.  Good bowel sounds. Extremities: no cyanosis, clubbing, rash, edema Neuro: alert & oriented x 3, cranial nerves grossly intact. moves all 4 extremities w/o difficulty. Affect pleasant.   ASSESSMENT & PLAN:

## 2012-06-12 ENCOUNTER — Encounter: Payer: Self-pay | Admitting: *Deleted

## 2012-06-12 ENCOUNTER — Ambulatory Visit (HOSPITAL_BASED_OUTPATIENT_CLINIC_OR_DEPARTMENT_OTHER): Payer: Medicare Other | Admitting: Physician Assistant

## 2012-06-12 ENCOUNTER — Telehealth: Payer: Self-pay | Admitting: *Deleted

## 2012-06-12 ENCOUNTER — Encounter: Payer: Self-pay | Admitting: Physician Assistant

## 2012-06-12 ENCOUNTER — Other Ambulatory Visit (HOSPITAL_BASED_OUTPATIENT_CLINIC_OR_DEPARTMENT_OTHER): Payer: Medicare Other

## 2012-06-12 VITALS — BP 159/84 | HR 68 | Temp 98.0°F | Resp 20 | Ht 64.0 in | Wt 180.4 lb

## 2012-06-12 DIAGNOSIS — Z170 Estrogen receptor positive status [ER+]: Secondary | ICD-10-CM

## 2012-06-12 DIAGNOSIS — F419 Anxiety disorder, unspecified: Secondary | ICD-10-CM

## 2012-06-12 DIAGNOSIS — C50919 Malignant neoplasm of unspecified site of unspecified female breast: Secondary | ICD-10-CM

## 2012-06-12 DIAGNOSIS — F329 Major depressive disorder, single episode, unspecified: Secondary | ICD-10-CM

## 2012-06-12 DIAGNOSIS — F411 Generalized anxiety disorder: Secondary | ICD-10-CM

## 2012-06-12 LAB — CBC WITH DIFFERENTIAL/PLATELET
BASO%: 1.1 % (ref 0.0–2.0)
EOS%: 4.5 % (ref 0.0–7.0)
HCT: 33.9 % — ABNORMAL LOW (ref 34.8–46.6)
LYMPH%: 21.4 % (ref 14.0–49.7)
MCH: 30.3 pg (ref 25.1–34.0)
MCHC: 34.6 g/dL (ref 31.5–36.0)
MCV: 87.5 fL (ref 79.5–101.0)
MONO%: 8.2 % (ref 0.0–14.0)
NEUT%: 64.8 % (ref 38.4–76.8)
Platelets: 243 10*3/uL (ref 145–400)
lymph#: 1.3 10*3/uL (ref 0.9–3.3)

## 2012-06-12 LAB — COMPREHENSIVE METABOLIC PANEL (CC13)
AST: 15 U/L (ref 5–34)
Alkaline Phosphatase: 120 U/L (ref 40–150)
BUN: 13 mg/dL (ref 7.0–26.0)
Creatinine: 0.9 mg/dL (ref 0.6–1.1)

## 2012-06-12 MED ORDER — DEXAMETHASONE 4 MG PO TABS: 8.0000 mg | ORAL_TABLET | Freq: Two times a day (BID) | ORAL | Status: DC

## 2012-06-12 MED ORDER — LIDOCAINE-PRILOCAINE 2.5-2.5 % EX CREA: TOPICAL_CREAM | CUTANEOUS | Status: DC | PRN

## 2012-06-12 MED ORDER — FLUOXETINE HCL 40 MG PO CAPS: 40.0000 mg | ORAL_CAPSULE | Freq: Every day | ORAL | Status: DC

## 2012-06-12 MED ORDER — PROCHLORPERAZINE MALEATE 10 MG PO TABS: 10.0000 mg | ORAL_TABLET | Freq: Four times a day (QID) | ORAL | Status: AC | PRN

## 2012-06-12 MED ORDER — LORAZEPAM 0.5 MG PO TABS: 0.5000 mg | ORAL_TABLET | Freq: Two times a day (BID) | ORAL | Status: DC | PRN

## 2012-06-12 MED ORDER — ONDANSETRON HCL 8 MG PO TABS: 8.0000 mg | ORAL_TABLET | Freq: Two times a day (BID) | ORAL | Status: DC | PRN

## 2012-06-12 NOTE — Progress Notes (Signed)
RECEIVED A FAX FROM [facility] CONCERNING A PRIOR AUTHORIZATION FOR ONDANSETRON. THIS REQUEST WAS GIVEN TO ELIZABETH SUTTON IN MANAGED CARE.

## 2012-06-12 NOTE — Telephone Encounter (Signed)
Per staff message I have adjusted appt for 1/20.  JMW  

## 2012-06-12 NOTE — Progress Notes (Signed)
ID: Rebecca Pugh   DOB: Oct 21, 1950  MR#: [id number]  WUJ#:[id number]  PCP: [name], MD GYN: [name] OTHER MD:  HISTORY OF PRESENT ILLNESS: The patient and her husband, Gene, moved back to this area recently from [facility] (Gene actually is a native of [place] and incidentally remains a [employer] fan).  As part of moving boxes, Rebecca Pugh felt something under her left arm.  She brought it to [name] attention and although the patient states that she has always had some cyclical swelling of her left axilla during menstruation, [name] set the patient up for evaluation at [facility].  On 12-12-05 the patient had a mammogram and ultrasound there and these showed no mass, distortion or microcalcifications in either breast however, in the left axilla there was a large macrolobulated mass and just above that there was a slightly prominent lymph node which did demonstrate a fatty hilum.  Physically on exam the mass was mobile and nontender and measured approximately 6.5 cm.  The ultrasound showed two small hypoechoic nodules in the left breast 3 cm. from the nipple measuring 7 mm. each and felt to be most likely benign however, the axillary mass, of course, was quite suspicious and biopsy was performed the same day.  This showed ([id number] and [id number]) invasive breast cancer which was strongly ER positive at 76%, moderately PR positive at 25% with a very high proliferation marker at 66%.  HER-2/neu was 1+ and FISH showed no amplification with a ratio of 1.1.    With this information, the patient was referred to Dr. [name] and on 12-20-05 the patient had bilateral breast MRI's.  In the left breast there was a 5.8 cm. axillary mass and several abnormally enlarged lymph nodes.  There was a 7 mm. left supraclavicular lymph node and the two nodules in the breast that had been noted by ultrasound were also noted by MRI measuring 1.1 and 0.7 cm.  In the right breast there were no  abnormalities noted.  MR guided biopsies of the left breast masses were obtained on 12-27-05 and both showed high grade ductal carcinoma.  There was evidence of lymphovascular invasion and high grade ductal carcinoma in situ as well ([id number]).    [name] discussed the situation with me and we felt it would be useful to have a PET scan prior to definitive surgery.  This was obtained on 12-30-05 and showed the primary axillary mass to have an SUV of 8.5.  The lymph nodes in the left axilla had SUV's in the 2.2 range, small focus of increased activity in the left breast had an SUV of 2.3.  There was also skin thickening with mildly increased diffuse FDG uptake within the skin suggesting an inflammatory breast cancer.  The neck, abdomen and  pelvis were negative.  In the lung windows, there were several scattered small areas of focal nodular density in the upper lobes with compressive atelectasis and this was felt to be benign.  Chest x-ray on 01-09-06 was negative.  With this information, [name] proceeded, after appropriate discussion, to left modified radical mastectomy on 01-15-06 and the insertion of a BARD port at the same time.  The final pathology ([id number]) showed the maximum tumor size to be 5 cm.  Margins were ample.  There was evidence of lymphovascular invasion.  This infiltrating ductal carcinoma was a grade 3 of 3 and 11 out of 14 lymph nodes were involved.  There was evidence of extracapsular extension.  Her subsequent history is as detailed below.   INTERVAL HISTORY: Bre returns today with her husband Gene for followup of her metastatic breast carcinoma. Per her consultation with [name] earlier this month, she is ready to initiate her new therapy next week, first dose on December 30. She'll be receiving docetaxel/trastuzumab/pertuzumab on a Q. three-week basis.   REVIEW OF SYSTEMS: Rebecca Pugh is feeling very anxious today. She ran out of her lorazepam, and is having difficulty  sleeping. She also mentions that with her last refill of fluoxetine, the dose change for some reason from 40 mg to 20 mg. She did not question the change, but simply decreased her dose which could be increasing her anxiety.  Physically, Rebecca Pugh is very tired, but has had no recent illnesses and denies fevers or chills. 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 Of course, status post trauma, the right foot has significant numbness as well. She's eating and drinking well and denies any nausea or change in bowel habits. She's had no additional diarrhea since discontinuing the lapatinib several weeks ago. She is breathing okay, with no new cough or shortness of breath. She denies any chest pains. She's had no abnormal headaches or dizziness. She does have some pain in her left side, but denies any new or unusual myalgias, arthralgias, or bony pain. She has some chronic swelling in the right foot and ankle, but no additional peripheral edema has been noted.  Detailed review of systems is otherwise stable and noncontributory.   PAST MEDICAL HISTORY: Past Medical History  Diagnosis Date  . Hypertension   . Depression   . breast ca dx'd 01/15/2006    chemo/xrt comp  remote tonsillectomy, remote cholecystectomy, status post appendectomy, multiple skin biopsies for what the patient said were simple keratoses, and status post port placement.  FAMILY HISTORY: The patient has no information regarding her father. She was brought up by her stepfather. The patient's mother died at the age of 74 with bladder cancer. The patient has one sister who is in good health. There is no history of breast or ovarian cancer in the family to the patient's knowledge.   GYNECOLOGIC HISTORY: She is GX, P2. She was premenopausal at the time of her initial breast cancer diagnosis  SOCIAL HISTORY: She and her  husband, Gene, have been married 10 years. 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 She has two children, a daughter, [name], who lives in [place] and is a Investment banker, corporate, and a son, [name], also in [place], who works for [employer]. She has two grandchildren and three step-grandchildren from her own two children, [name] and [name], who are from her first marriage. Gene has one daughter from his first marriage and he has grandchildren through her. The patient was brought up as a Catholic but currently is not practicing. She and Gene are attending a [employer].      ADVANCED DIRECTIVES:  HEALTH MAINTENANCE: History  Substance Use Topics  . Smoking status: Former Smoker -- 20 years    Quit date: 01/31/1995  . Smokeless tobacco: Never Used  . Alcohol Use: Yes     Comment: occasionl     Colonoscopy:  PAP:  Bone density: August 2012, "Normal"  Lipid panel:  No Known Allergies  Current Outpatient Prescriptions  Medication Sig Dispense Refill  . calcium citrate (CALCITRATE - DOSED IN MG ELEMENTAL CALCIUM) 950 MG tablet Take 1 tablet by mouth daily.        . cholecalciferol (VITAMIN D) 1000 UNITS tablet Take 3,000 Units by mouth daily.        Marland Kitchen FLUoxetine (PROZAC) 40 MG capsule Take 1 capsule (40 mg total) by mouth daily.  30 capsule  3  . losartan (COZAAR) 50 MG tablet Take 1 tablet (50 mg total) by mouth daily.  30 tablet  6  . dexamethasone (DECADRON) 4 MG tablet Take 2 tablets (8 mg total) by mouth 2 (two) times daily with a meal.  60 tablet  1  . lidocaine-prilocaine (EMLA) cream Apply topically as needed.  30 g  3  . loperamide (IMODIUM) 2 MG capsule Take 2 mg by mouth 4 (four) times daily as needed. diarrhea      . LORazepam (ATIVAN) 0.5 MG tablet Take 1 tablet (0.5 mg total) by mouth 2 (two) times daily as needed.  60 tablet  0  . ondansetron (ZOFRAN) 8 MG tablet Take 1 tablet (8 mg  total) by mouth 2 (two) times daily as needed for nausea.  20 tablet  0  . oxyCODONE (OXY IR/ROXICODONE) 5 MG immediate release tablet       . potassium chloride (KLOR-CON) 20 MEQ packet Take 20 mEq by mouth daily.       . prochlorperazine (COMPAZINE) 10 MG tablet Take 1 tablet (10 mg total) by mouth every 6 (six) hours as needed.  30 tablet  3  . traMADol (ULTRAM) 50 MG tablet          OBJECTIVE:   Filed Vitals:   06/12/12 0930  BP: 159/84  Pulse: 68  Temp: 98 F (36.7 C)  Resp: 20     Body mass index is 30.97 kg/(m^2).    ECOG FS: 1 Filed Weights   06/12/12 0930  Weight: 180 lb 6.4 oz (81.829 kg)   Middle-aged white female who appears anxious but in no acute distress.  Remainder of physical exam was deferred today.    LAB RESULTS:  Lab Results  Component Value Date   WBC 6.1 06/12/2012   NEUTROABS 4.0 06/12/2012   HGB 11.7 06/12/2012   HCT 33.9* 06/12/2012   MCV 87.5 06/12/2012   PLT 243 06/12/2012      Chemistry      Component Value Date/Time   NA 139 06/12/2012 0846   NA 136 02/01/2012 0500   NA 142 11/05/2010 1036   K 4.6 06/12/2012 0846   K 4.5 02/01/2012 0500   K 4.5 11/05/2010 1036   CL 103 06/12/2012 0846   CL 102 02/01/2012 0500   CL 99 11/05/2010 1036   CO2 25 06/12/2012 0846   CO2 25 02/01/2012 0500   CO2 26 11/05/2010 1036   BUN 13.0 06/12/2012 0846   BUN 13 02/01/2012 0500   BUN 14 11/05/2010 1036   CREATININE 0.9 06/12/2012 0846   CREATININE 0.97 02/01/2012 0500   CREATININE 0.8 11/05/2010 1036      Component Value Date/Time   CALCIUM 9.2 06/12/2012 0846   CALCIUM 8.6 02/01/2012 0500   CALCIUM 8.8 11/05/2010 1036   ALKPHOS 120 06/12/2012 0846   ALKPHOS 88 01/08/2012 0916   ALKPHOS 106* 11/05/2010 1036   AST 15 06/12/2012 0846   AST 24 01/08/2012 0916   AST 27 11/05/2010 1036   ALT 14 06/12/2012 0846   ALT 27 01/08/2012 0916  BILITOT 0.91 06/12/2012 0846   BILITOT 0.9 01/08/2012 0916   BILITOT 0.80 11/05/2010 1036       Lab Results  Component  Value Date   LABCA2 65* 05/25/2012    STUDIES: Echocardiogram 05/21/2012 continues to show a well preserved ejection fraction of 55 - 60 %.    ASSESSMENT: 61 year old [place] woman with stage IV breast cancer.  1. 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 2. Status post 4 cycles of adjuvant docetaxel, carboplatin and trastuzumab completed November 2007. 3. Postmastectomy radiation completed February 2008. 4. Anastrozole begun February 2008 (the trastuzumab also was continued to complete a year), continued until June 2012.  5. 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  6. On tamoxifen/lapatinib/trastuzumab between June 2012 and October 2012.  7. Ixempra/lapatinib/trastuzumab started October 2012, the Ixempra discontinued in February 2013 due to peripheral neuropathy. 8. Continuing on lapatinib/trastuzumab with the addition of fulvestrant, first injections given on 07/31/2011. Lapatinib given at a dose of 1 g (4 tablets) daily, and trastuzumab given at 8 mg/kg every 4 weeks to coordinate with injection appointments. 9. Discontinued lapatinib and fulvestrant in December 2013, and initiated q. three-week docetaxel/trastuzumab/pertuzumab, first dose given 06/15/2012.  PLAN:  Over half of our 40 minute appointment today was spent counseling the patient regarding her upcoming plan of treatment, reviewing possible side effects, and reviewing her antinausea regimen. She'll be taking dexamethasone beginning the day before chemotherapy, to tablets twice daily. She will also have ondansetron and prochlorperazine in addition to her lorazepam. I have refilled her EMLA cream. I also refilled her Prozac and we went back to her original dose of 40 mg daily.  Lexxie will return Monday, December 30, for day 1 cycle 1 of  docetaxel/trastuzumab/pertuzumab.  We are not going to use Neulasta at least for the first cycle, but follow her counts and add Neulasta with future cycles if necessary.   If she does well at the plan is to go 4-6 cycles and then restage.  Makhiya voices understanding and agreement with this plan, and will call any changes or problems. Otherwise she'll return for followup on January 6 to assess tolerance.   Johnie Makki    06/12/2012

## 2012-06-12 NOTE — Telephone Encounter (Signed)
CANCEL lab before infusion on 12/30; CANCEL 9:30 lab on 1/6; ADD AB with lab and infusion 1/20; ADD lab and AB 1/27  Sent michelle email to set up treatment

## 2012-06-15 ENCOUNTER — Other Ambulatory Visit: Payer: Self-pay | Admitting: *Deleted

## 2012-06-15 ENCOUNTER — Telehealth: Payer: Self-pay | Admitting: *Deleted

## 2012-06-15 ENCOUNTER — Other Ambulatory Visit: Payer: Medicare Other | Admitting: Lab

## 2012-06-15 ENCOUNTER — Encounter: Payer: Self-pay | Admitting: Oncology

## 2012-06-15 ENCOUNTER — Ambulatory Visit (HOSPITAL_BASED_OUTPATIENT_CLINIC_OR_DEPARTMENT_OTHER): Payer: Medicare Other

## 2012-06-15 VITALS — BP 179/80 | HR 58 | Temp 97.3°F

## 2012-06-15 DIAGNOSIS — C50919 Malignant neoplasm of unspecified site of unspecified female breast: Secondary | ICD-10-CM

## 2012-06-15 DIAGNOSIS — Z5112 Encounter for antineoplastic immunotherapy: Secondary | ICD-10-CM

## 2012-06-15 DIAGNOSIS — C778 Secondary and unspecified malignant neoplasm of lymph nodes of multiple regions: Secondary | ICD-10-CM

## 2012-06-15 DIAGNOSIS — C7970 Secondary malignant neoplasm of unspecified adrenal gland: Secondary | ICD-10-CM

## 2012-06-15 DIAGNOSIS — I10 Essential (primary) hypertension: Secondary | ICD-10-CM

## 2012-06-15 MED ORDER — SODIUM CHLORIDE 0.9 % IV SOLN
Freq: Once | INTRAVENOUS | Status: AC
  Administered 2012-06-15: 13:00:00 via INTRAVENOUS

## 2012-06-15 MED ORDER — DEXAMETHASONE SODIUM PHOSPHATE 10 MG/ML IJ SOLN
10.0000 mg | Freq: Once | INTRAMUSCULAR | Status: AC
  Administered 2012-06-15: 10 mg via INTRAVENOUS

## 2012-06-15 MED ORDER — DOCETAXEL CHEMO INJECTION 160 MG/16ML
75.0000 mg/m2 | Freq: Once | INTRAVENOUS | Status: AC
  Administered 2012-06-15: 150 mg via INTRAVENOUS
  Filled 2012-06-15: qty 15

## 2012-06-15 MED ORDER — ACETAMINOPHEN 325 MG PO TABS
650.0000 mg | ORAL_TABLET | Freq: Once | ORAL | Status: AC
  Administered 2012-06-15: 650 mg via ORAL

## 2012-06-15 MED ORDER — TRASTUZUMAB CHEMO INJECTION 440 MG: 6.0000 mg/kg | Freq: Once | INTRAVENOUS | Status: DC

## 2012-06-15 MED ORDER — SODIUM CHLORIDE 0.9 % IJ SOLN
10.0000 mL | INTRAMUSCULAR | Status: DC | PRN
  Administered 2012-06-15: 10 mL
  Filled 2012-06-15: qty 10

## 2012-06-15 MED ORDER — CLONIDINE HCL 0.1 MG PO TABS: 0.2000 mg | ORAL_TABLET | Freq: Once | ORAL | Status: DC

## 2012-06-15 MED ORDER — DIPHENHYDRAMINE HCL 25 MG PO CAPS
25.0000 mg | ORAL_CAPSULE | Freq: Once | ORAL | Status: AC
  Administered 2012-06-15: 25 mg via ORAL

## 2012-06-15 MED ORDER — TRASTUZUMAB CHEMO INJECTION 440 MG
6.0000 mg/kg | Freq: Once | INTRAVENOUS | Status: AC
  Administered 2012-06-15: 483 mg via INTRAVENOUS
  Filled 2012-06-15: qty 23

## 2012-06-15 MED ORDER — ONDANSETRON 16 MG/50ML IVPB (CHCC)
16.0000 mg | Freq: Once | INTRAVENOUS | Status: AC
  Administered 2012-06-15: 16 mg via INTRAVENOUS

## 2012-06-15 MED ORDER — SODIUM CHLORIDE 0.9 % IV SOLN
840.0000 mg | Freq: Once | INTRAVENOUS | Status: AC
  Administered 2012-06-15: 840 mg via INTRAVENOUS
  Filled 2012-06-15: qty 28

## 2012-06-15 MED ORDER — HEPARIN SOD (PORK) LOCK FLUSH 100 UNIT/ML IV SOLN
500.0000 [IU] | Freq: Once | INTRAVENOUS | Status: AC | PRN
  Administered 2012-06-15: 500 [IU]
  Filled 2012-06-15: qty 5

## 2012-06-15 NOTE — Patient Instructions (Addendum)
[facility] Discharge Instructions for Patients Receiving Chemotherapy  Today you received the following chemotherapy agents: herceptin, perjeta, taxotere.  To help prevent nausea and vomiting after your treatment, we encourage you to take your nausea medication.   Take it as often as prescribed.     If you develop nausea and vomiting that is not controlled by your nausea medication, call the clinic. If it is after clinic hours your family physician or the after hours number for the clinic or go to the Emergency Department.   BELOW ARE SYMPTOMS THAT SHOULD BE REPORTED IMMEDIATELY:  *FEVER GREATER THAN 100.5 F  *CHILLS WITH OR WITHOUT FEVER  NAUSEA AND VOMITING THAT IS NOT CONTROLLED WITH YOUR NAUSEA MEDICATION  *UNUSUAL SHORTNESS OF BREATH  *UNUSUAL BRUISING OR BLEEDING  TENDERNESS IN MOUTH AND THROAT WITH OR WITHOUT PRESENCE OF ULCERS  *URINARY PROBLEMS  *BOWEL PROBLEMS  UNUSUAL RASH Items with * indicate a potential emergency and should be followed up as soon as possible.  One of the nurses will contact you 24 hours after your treatment. Please let the nurse know about any problems that you may have experienced. Feel free to call the clinic you have any questions or concerns. The clinic phone number is [phone].   I have been informed and understand all the instructions given to me. I know to contact the clinic, my physician, or go to the Emergency Department if any problems should occur. I do not have any questions at this time, but understand that I may call the clinic during office hours   should I have any questions or need assistance in obtaining follow up care.    __________________________________________  _____________  __________ Signature of Patient or Authorized Representative            Date                   Time    __________________________________________ Nurse's Signature   Pertuzumab injection (Perjeta) What is this  medicine? PERTUZUMAB is a monoclonal antibody that targets a protein called HER2. HER2 is found in some breast cancers. This medicine can stop cancer cell growth. This medicine is used with other cancer treatments. This medicine may be used for other purposes; ask your health care provider or pharmacist if you have questions. What should I tell my health care provider before I take this medicine? They need to know if you have any of these conditions: -heart disease -heart failure -high blood pressure -history of irregular heart beat -recent or ongoing radiation therapy -an unusual or allergic reaction to pertuzumab, other medicines, foods, dyes, or preservatives -pregnant or trying to get pregnant -breast-feeding How should I use this medicine? This medicine is for infusion into a vein. It is given by a health care professional in a hospital or clinic setting. Talk to your pediatrician regarding the use of this medicine in children. Special care may be needed. Overdosage: If you think you've taken too much of this medicine contact a poison control center or emergency room at once. Overdosage: If you think you have taken too much of this medicine contact a poison control center or emergency room at once. NOTE: This medicine is only for you. Do not share this medicine with others. What if I miss a dose? It is important not to miss your dose. Call your doctor or health care professional if you are unable to keep an appointment. What may interact with this medicine? Interactions are not expected.  Give your health care provider a list of all the medicines, herbs, non-prescription drugs, or dietary supplements you use. Also tell them if you smoke, drink alcohol, or use illegal drugs. Some items may interact with your medicine. This list may not describe all possible interactions. Give your health care provider a list of all the medicines, herbs, non-prescription drugs, or dietary supplements you  use. Also tell them if you smoke, drink alcohol, or use illegal drugs. Some items may interact with your medicine. What should I watch for while using this medicine? Your condition will be monitored carefully while you are receiving this medicine. Report any side effects. Continue your course of treatment even though you feel ill unless your doctor tells you to stop. Do not become pregnant while taking this medicine. Women should inform their doctor if they wish to become pregnant or think they might be pregnant. There is a potential for serious side effects to an unborn child. Talk to your health care professional or pharmacist for more information. Do not breast-feed an infant while taking this medicine. Call your doctor or health care professional for advice if you get a fever, chills or sore throat, or other symptoms of a cold or flu. Do not treat yourself. Try to avoid being around people who are sick. You may experience fever, chills, and headache during the infusion. Report any side effects during the infusion to your health care professional. What side effects may I notice from receiving this medicine? Side effects that you should report to your doctor or health care professional as soon as possible: -breathing problems -chest pain or palpitations -dizziness -feeling faint or lightheaded -fever or chills -skin rash, itching or hives -sore throat -swelling of the face, lips, or tongue -swelling of the legs or ankles -unusually weak or tired  Side effects that usually do not require medical attention (Report these to your doctor or health care professional if they continue or are bothersome.): -diarrhea -hair loss -nausea, vomiting -tiredness This list may not describe all possible side effects. Call your doctor for medical advice about side effects. You may report side effects to FDA at 1-800-FDA-1088. Where should I keep my medicine? This drug is given in a hospital or clinic and  will not be stored at home. NOTE: This sheet is a summary. It may not cover all possible information. If you have questions about this medicine, talk to your doctor, pharmacist, or health care provider.  2013, [employer] Standard. (11/28/2010 4:45:56 PM)   Docetaxel injection (Taxotere) What is this medicine? DOCETAXEL (doe se TAX el) is a chemotherapy drug. It targets fast dividing cells, like cancer cells, and causes these cells to die. This medicine is used to treat many types of cancers like breast cancer, certain stomach cancers, head and neck cancer, lung cancer, and prostate cancer. This medicine may be used for other purposes; ask your health care provider or pharmacist if you have questions. What should I tell my health care provider before I take this medicine? They need to know if you have any of these conditions: -infection (especially a virus infection such as chickenpox, cold sores, or herpes) -liver disease -low blood counts, like low white cell, platelet, or red cell counts -an unusual or allergic reaction to docetaxel, polysorbate 80, other chemotherapy agents, other medicines, foods, dyes, or preservatives -pregnant or trying to get pregnant -breast-feeding How should I use this medicine? This drug is given as an infusion into a vein. It is administered in a hospital  or clinic by a specially trained health care professional. Talk to your pediatrician regarding the use of this medicine in children. Special care may be needed. Overdosage: If you think you have taken too much of this medicine contact a poison control center or emergency room at once. NOTE: This medicine is only for you. Do not share this medicine with others. What if I miss a dose? It is important not to miss your dose. Call your doctor or health care professional if you are unable to keep an appointment. What may interact with this medicine? -cyclosporine -erythromycin -ketoconazole -medicines to  increase blood counts like filgrastim, pegfilgrastim, sargramostim -vaccines Talk to your doctor or health care professional before taking any of these medicines: -acetaminophen -aspirin -ibuprofen -ketoprofen -naproxen This list may not describe all possible interactions. Give your health care provider a list of all the medicines, herbs, non-prescription drugs, or dietary supplements you use. Also tell them if you smoke, drink alcohol, or use illegal drugs. Some items may interact with your medicine. What should I watch for while using this medicine? Your condition will be monitored carefully while you are receiving this medicine. You will need important blood work done while you are taking this medicine. This drug may make you feel generally unwell. This is not uncommon, as chemotherapy can affect healthy cells as well as cancer cells. Report any side effects. Continue your course of treatment even though you feel ill unless your doctor tells you to stop. In some cases, you may be given additional medicines to help with side effects. Follow all directions for their use. Call your doctor or health care professional for advice if you get a fever, chills or sore throat, or other symptoms of a cold or flu. Do not treat yourself. This drug decreases your body's ability to fight infections. Try to avoid being around people who are sick. This medicine may increase your risk to bruise or bleed. Call your doctor or health care professional if you notice any unusual bleeding. Be careful brushing and flossing your teeth or using a toothpick because you may get an infection or bleed more easily. If you have any dental work done, tell your dentist you are receiving this medicine. Avoid taking products that contain aspirin, acetaminophen, ibuprofen, naproxen, or ketoprofen unless instructed by your doctor. These medicines may hide a fever. Do not become pregnant while taking this medicine. Women should inform  their doctor if they wish to become pregnant or think they might be pregnant. There is a potential for serious side effects to an unborn child. Talk to your health care professional or pharmacist for more information. Do not breast-feed an infant while taking this medicine. What side effects may I notice from receiving this medicine? Side effects that you should report to your doctor or health care professional as soon as possible: -allergic reactions like skin rash, itching or hives, swelling of the face, lips, or tongue -low blood counts - This drug may decrease the number of white blood cells, red blood cells and platelets. You may be at increased risk for infections and bleeding. -signs of infection - fever or chills, cough, sore throat, pain or difficulty passing urine -signs of decreased platelets or bleeding - bruising, pinpoint red spots on the skin, black, tarry stools, nosebleeds -signs of decreased red blood cells - unusually weak or tired, fainting spells, lightheadedness -breathing problems -fast or irregular heartbeat -low blood pressure -mouth sores -nausea and vomiting -pain, swelling, redness or irritation at the  injection site -pain, tingling, numbness in the hands or feet -swelling of the ankle, feet, hands -weight gain Side effects that usually do not require medical attention (report to your prescriber or health care professional if they continue or are bothersome): -bone pain -complete hair loss including hair on your head, underarms, pubic hair, eyebrows, and eyelashes -diarrhea -excessive tearing -changes in the color of fingernails -loosening of the fingernails -nausea -muscle pain -red flush to skin -sweating -weak or tired This list may not describe all possible side effects. Call your doctor for medical advice about side effects. You may report side effects to FDA at 1-800-FDA-1088. Where should I keep my medicine? This drug is given in a hospital or clinic  and will not be stored at home. NOTE: This sheet is a summary. It may not cover all possible information. If you have questions about this medicine, talk to your doctor, pharmacist, or health care provider.  2012, [employer] Standard. (05/16/2008 11:52:10 AM)

## 2012-06-15 NOTE — Progress Notes (Addendum)
ENROLLED PATIENT GATCF FOR HERCEPTIN AND PERJETA.  CALLED HER INS[employer]ANCE [employer] [facility] [phone] AND I SPOKE WITH [employer] WHO HAS APPROVED THE ONDANSETRON 8 MG TABS. CALLED [facility] AND TOLD THEM IT WAS OK. [phone].  06/16/12 PT IS APPROVED FOR PERJETA AND HERCEPTIN,                PT ID [id number]           CASE ID [id number]

## 2012-06-15 NOTE — Telephone Encounter (Signed)
Opened in error

## 2012-06-15 NOTE — Progress Notes (Signed)
BP from 143/70 at 1320 to 186/63 at 1350.  MD notified.

## 2012-06-16 ENCOUNTER — Telehealth: Payer: Self-pay | Admitting: *Deleted

## 2012-06-16 NOTE — Telephone Encounter (Signed)
Called patient following new chemo yesterday, no complaints of nausea/vomiting/diarrhea. States she is just taking it easy. Instructed to call with any problems and continue to hydrate well.

## 2012-06-16 NOTE — Telephone Encounter (Signed)
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

## 2012-06-18 ENCOUNTER — Telehealth: Payer: Self-pay | Admitting: *Deleted

## 2012-06-18 NOTE — Telephone Encounter (Signed)
Pt called to state " constipated since getting this new treatment " - with last BM on Sunday 12/29.  Per discussion she states she has used " phillips yesterday and took exlax this am ".  Per inquiry pt is not nauseated - stomach is soft with mild tenderness on left side.  Per review with MD recommended use of fleets enema followed by duccolax supp- if no results in 2 hours to repeat fleets- if no results she is to contact this office.

## 2012-06-22 ENCOUNTER — Ambulatory Visit (HOSPITAL_BASED_OUTPATIENT_CLINIC_OR_DEPARTMENT_OTHER): Payer: Medicare Other | Admitting: Oncology

## 2012-06-22 ENCOUNTER — Other Ambulatory Visit: Payer: Medicare Other | Admitting: Lab

## 2012-06-22 ENCOUNTER — Ambulatory Visit: Payer: Medicare Other

## 2012-06-22 ENCOUNTER — Other Ambulatory Visit (HOSPITAL_BASED_OUTPATIENT_CLINIC_OR_DEPARTMENT_OTHER): Payer: Medicare Other | Admitting: Lab

## 2012-06-22 VITALS — BP 147/82 | HR 89 | Temp 97.0°F | Resp 20 | Ht 64.0 in | Wt 171.4 lb

## 2012-06-22 DIAGNOSIS — C50919 Malignant neoplasm of unspecified site of unspecified female breast: Secondary | ICD-10-CM

## 2012-06-22 DIAGNOSIS — C7970 Secondary malignant neoplasm of unspecified adrenal gland: Secondary | ICD-10-CM

## 2012-06-22 DIAGNOSIS — C778 Secondary and unspecified malignant neoplasm of lymph nodes of multiple regions: Secondary | ICD-10-CM

## 2012-06-22 DIAGNOSIS — Z170 Estrogen receptor positive status [ER+]: Secondary | ICD-10-CM

## 2012-06-22 LAB — CBC WITH DIFFERENTIAL/PLATELET
BASO%: 2 % (ref 0.0–2.0)
EOS%: 1.9 % (ref 0.0–7.0)
HCT: 35.2 % (ref 34.8–46.6)
LYMPH%: 74.8 % — ABNORMAL HIGH (ref 14.0–49.7)
MCH: 29.4 pg (ref 25.1–34.0)
MCHC: 34.5 g/dL (ref 31.5–36.0)
MONO#: 0.1 10*3/uL (ref 0.1–0.9)
NEUT%: 2.4 % — ABNORMAL LOW (ref 38.4–76.8)
Platelets: 247 10*3/uL (ref 145–400)

## 2012-06-22 MED ORDER — PEGFILGRASTIM INJECTION 6 MG/0.6ML
6.0000 mg | Freq: Once | SUBCUTANEOUS | Status: AC
  Administered 2012-06-22: 6 mg via SUBCUTANEOUS
  Filled 2012-06-22: qty 0.6

## 2012-06-22 MED ORDER — CIPROFLOXACIN HCL 500 MG PO TABS: 500.0000 mg | ORAL_TABLET | Freq: Two times a day (BID) | ORAL | Status: DC

## 2012-06-22 NOTE — Progress Notes (Signed)
ID: Rebecca Pugh   DOB: April 10, 1951  MR#: [id number]  WUJ#:[id number]  PCP: Rebecca Bur, MD GYN: SU: Rebecca Pugh OTHER MD:  HISTORY OF PRESENT ILLNESS: The patient and her husband, Rebecca Pugh, moved back to this area recently from [facility] (Rebecca Pugh actually is a native of [place] and incidentally remains a [employer] fan).  As part of moving boxes, Rebecca Pugh felt something under her left arm.  She brought it to Dr. Lovena Pugh attention and although the patient states that she has always had some cyclical swelling of her left axilla during menstruation, Dr. Lalla Pugh set the patient up for evaluation at [facility].  On 12-12-05 the patient had a mammogram and ultrasound there and these showed no mass, distortion or microcalcifications in either breast however, in the left axilla there was a large macrolobulated mass and just above that there was a slightly prominent lymph node which did demonstrate a fatty hilum.  Physically on exam the mass was mobile and nontender and measured approximately 6.5 cm.  The ultrasound showed two small hypoechoic nodules in the left breast 3 cm. from the nipple measuring 7 mm. each and felt to be most likely benign however, the axillary mass, of course, was quite suspicious and biopsy was performed the same day.  This showed ([id number] and [id number]) invasive breast cancer which was strongly ER positive at 76%, moderately PR positive at 25% with a very high proliferation marker at 66%.  HER-2/neu was 1+ and FISH showed no amplification with a ratio of 1.1.    With this information, the patient was referred to Dr. Claud Pugh and on 12-20-05 the patient had bilateral breast MRI's.  In the left breast there was a 5.8 cm. axillary mass and several abnormally enlarged lymph nodes.  There was a 7 mm. left supraclavicular lymph node and the two nodules in the breast that had been noted by ultrasound were also noted by MRI measuring 1.1 and 0.7 cm.  In the right breast there were no  abnormalities noted.  MR guided biopsies of the left breast masses were obtained on 12-27-05 and both showed high grade ductal carcinoma.  There was evidence of lymphovascular invasion and high grade ductal carcinoma in situ as well ([id number]).    Dr. Derrell Pugh discussed the situation with me and we felt it would be useful to have a PET scan prior to definitive surgery.  This was obtained on 12-30-05 and showed the primary axillary mass to have an SUV of 8.5.  The lymph nodes in the left axilla had SUV's in the 2.2 range, small focus of increased activity in the left breast had an SUV of 2.3.  There was also skin thickening with mildly increased diffuse FDG uptake within the skin suggesting an inflammatory breast cancer.  The neck, abdomen and  pelvis were negative.  In the lung windows, there were several scattered small areas of focal nodular density in the upper lobes with compressive atelectasis and this was felt to be benign.  Chest x-ray on 01-09-06 was negative.  With this information, Dr. Derrell Pugh proceeded, after appropriate discussion, to left modified radical mastectomy on 01-15-06 and the insertion of a BARD port at the same time.  The final pathology ([id number]) showed the maximum tumor size to be 5 cm.  Margins were ample.  There was evidence of lymphovascular invasion.  This infiltrating ductal carcinoma was a grade 3 of 3 and 11 out of 14 lymph nodes were involved.  There was evidence of extracapsular extension.  Her subsequent history is as detailed below.   INTERVAL HISTORY: Rebecca Pugh returns today with her husband Rebecca Pugh for followup of her metastatic breast carcinoma. Per her consultation with Dr. Darnelle Pugh earlier this month, she is ready to initiate her new therapy next week, first dose on December 30. She'll be receiving docetaxel/trastuzumab/pertuzumab on a Q. three-week basis.   REVIEW OF SYSTEMS: Rebecca Pugh is feeling very anxious today. She ran out of her lorazepam, and is having difficulty  sleeping. She also mentions that with her last refill of fluoxetine, the dose change for some reason from 40 mg to 20 mg. She did not question the change, but simply decreased her dose which could be increasing her anxiety.  Physically, Rebecca Pugh is very tired, but has had no recent illnesses and denies fevers or chills. 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 Of course, status post trauma, the right foot has significant numbness as well. She's eating and drinking well and denies any nausea or change in bowel habits. She's had no additional diarrhea since discontinuing the lapatinib several weeks ago. She is breathing okay, with no new cough or shortness of breath. She denies any chest pains. She's had no abnormal headaches or dizziness. She does have some pain in her left side, but denies any new or unusual myalgias, arthralgias, or bony pain. She has some chronic swelling in the right foot and ankle, but no additional peripheral edema has been noted.  Detailed review of systems is otherwise stable and noncontributory.   PAST MEDICAL HISTORY: Past Medical History  Diagnosis Date  . Hypertension   . Depression   . breast ca dx'd 01/15/2006    chemo/xrt comp  remote tonsillectomy, remote cholecystectomy, status post appendectomy, multiple skin biopsies for what the patient said were simple keratoses, and status post port placement.  FAMILY HISTORY: The patient has no information regarding her father. She was brought up by her stepfather. The patient's mother died at the age of 42 with bladder cancer. The patient has one sister who is in good health. There is no history of breast or ovarian cancer in the family to the patient's knowledge.   GYNECOLOGIC HISTORY: She is GX, P2. She was premenopausal at the time of her initial breast cancer diagnosis  SOCIAL HISTORY: She and her  husband, Rebecca Pugh, have been married 10 years. 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 She has two children, a daughter, [name], who lives in [place] and is a Investment banker, corporate, and a son, [name], also in [place], who works for [employer]. She has two grandchildren and three step-grandchildren from her own two children, [name] and [name], who are from her first marriage. Rebecca Pugh has one daughter from his first marriage and he has grandchildren through her. The patient was brought up as a Catholic but currently is not practicing. She and Rebecca Pugh are attending a [employer].      ADVANCED DIRECTIVES:  HEALTH MAINTENANCE: History  Substance Use Topics  . Smoking status: Former Smoker -- 20 years    Quit date: 01/31/1995  . Smokeless tobacco: Never Used  . Alcohol Use: Yes     Comment: occasionl     Colonoscopy:  PAP:  Bone density: August 2012, "Normal"  Lipid panel:  No Known Allergies  Current Outpatient Prescriptions  Medication Sig Dispense Refill  . calcium citrate (CALCITRATE - DOSED IN MG ELEMENTAL CALCIUM) 950 MG tablet Take 1 tablet by mouth daily.        . cholecalciferol (VITAMIN D) 1000 UNITS tablet Take 3,000 Units by mouth daily.        Marland Kitchen dexamethasone (DECADRON) 4 MG tablet Take 2 tablets (8 mg total) by mouth 2 (two) times daily with a meal.  60 tablet  1  . FLUoxetine (PROZAC) 40 MG capsule Take 1 capsule (40 mg total) by mouth daily.  30 capsule  3  . lidocaine-prilocaine (EMLA) cream Apply topically as needed.  30 g  3  . loperamide (IMODIUM) 2 MG capsule Take 2 mg by mouth 4 (four) times daily as needed. diarrhea      . LORazepam (ATIVAN) 0.5 MG tablet Take 1 tablet (0.5 mg total) by mouth 2 (two) times daily as needed.  60 tablet  0  . losartan (COZAAR) 50 MG tablet Take 1 tablet (50 mg total) by mouth daily.  30 tablet  6  . ondansetron (ZOFRAN) 8 MG tablet Take 1 tablet (8 mg  total) by mouth 2 (two) times daily as needed for nausea.  20 tablet  0  . oxyCODONE (OXY IR/ROXICODONE) 5 MG immediate release tablet       . potassium chloride (KLOR-CON) 20 MEQ packet Take 20 mEq by mouth daily.       . prochlorperazine (COMPAZINE) 10 MG tablet Take 1 tablet (10 mg total) by mouth every 6 (six) hours as needed.  30 tablet  3  . traMADol (ULTRAM) 50 MG tablet        No current facility-administered medications for this visit.   Facility-Administered Medications Ordered in Other Visits  Medication Dose Route Frequency Provider Last Rate Last Dose  . cloNIDine (CATAPRES) tablet 0.2 mg  0.2 mg Oral Once Catalina Gravel, PA         OBJECTIVE:   Filed Vitals:   06/22/12 1523  BP: 147/82  Pulse: 89  Temp: 97 F (36.1 C)  Resp: 20     Body mass index is 29.42 kg/(m^2).    ECOG FS: 1 Filed Weights   06/22/12 1523  Weight: 171 lb 6.4 oz (77.747 kg)   Middle-aged white female who appears anxious but in no acute distress.  Remainder of physical exam was deferred today.    LAB RESULTS:  Lab Results  Component Value Date   WBC 0.6* 06/22/2012   NEUTROABS 0.0* 06/22/2012   HGB 12.2 06/22/2012   HCT 35.2 06/22/2012   MCV 85.0 06/22/2012   PLT 247 06/22/2012      Chemistry      Component Value Date/Time   NA 139 06/12/2012 0846   NA 136 02/01/2012 0500   NA 142 11/05/2010 1036   K 4.6 06/12/2012 0846   K 4.5 02/01/2012 0500   K 4.5 11/05/2010 1036   CL 103 06/12/2012 0846   CL 102 02/01/2012 0500   CL 99 11/05/2010 1036   CO2 25 06/12/2012 0846   CO2 25 02/01/2012 0500   CO2 26 11/05/2010 1036   BUN 13.0 06/12/2012 0846   BUN 13 02/01/2012 0500   BUN 14 11/05/2010 1036   CREATININE 0.9 06/12/2012 0846   CREATININE 0.97 02/01/2012 0500   CREATININE 0.8 11/05/2010 1036      Component Value Date/Time   CALCIUM 9.2 06/12/2012 0846   CALCIUM 8.6 02/01/2012 0500   CALCIUM 8.8 11/05/2010 1036  ALKPHOS 120 06/12/2012 0846   ALKPHOS 88 01/08/2012 0916   ALKPHOS 106* 11/05/2010 1036     AST 15 06/12/2012 0846   AST 24 01/08/2012 0916   AST 27 11/05/2010 1036   ALT 14 06/12/2012 0846   ALT 27 01/08/2012 0916   BILITOT 0.91 06/12/2012 0846   BILITOT 0.9 01/08/2012 0916   BILITOT 0.80 11/05/2010 1036       Lab Results  Component Value Date   LABCA2 73* 06/12/2012    STUDIES: Echocardiogram 05/21/2012 continues to show a well preserved ejection fraction of 55 - 60 %.    ASSESSMENT: 62 year old [place] woman with stage IV breast cancer.  1. 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 2. Status post 4 cycles of adjuvant docetaxel, carboplatin and trastuzumab completed November 2007. 3. Postmastectomy radiation completed February 2008. 4. Anastrozole begun February 2008 (the trastuzumab also was continued to complete a year), continued until June 2012.  5. 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  6. On tamoxifen/lapatinib/trastuzumab between June 2012 and October 2012.  7. Ixempra/lapatinib/trastuzumab started October 2012, the Ixempra discontinued in February 2013 due to peripheral neuropathy. 8. Continuing on lapatinib/trastuzumab with the addition of fulvestrant, first injections given on 07/31/2011. Lapatinib given at a dose of 1 g (4 tablets) daily, and trastuzumab given at 8 mg/kg every 4 weeks to coordinate with injection appointments. 9. Discontinued lapatinib and fulvestrant in December 2013, and initiated q. three-week docetaxel/trastuzumab/pertuzumab, first dose given 06/15/2012.  PLAN:  Over half of our 40 minute appointment today was spent counseling the patient regarding her upcoming plan of treatment, reviewing possible side effects, and reviewing her antinausea regimen. She'll be taking dexamethasone beginning the day before chemotherapy, to tablets twice daily. She  will also have ondansetron and prochlorperazine in addition to her lorazepam. I have refilled her EMLA cream. I also refilled her Prozac and we went back to her original dose of 40 mg daily.  Rebecca Pugh will return Monday, December 30, for day 1 cycle 1 of docetaxel/trastuzumab/pertuzumab.  We are not going to use Neulasta at least for the first cycle, but follow her counts and add Neulasta with future cycles if necessary.   If she does well at the plan is to go 4-6 cycles and then restage.  Rebecca Pugh voices understanding and agreement with this plan, and will call any changes or problems. Otherwise she'll return for followup on January 6 to assess tolerance.   MAGRINAT,GUSTAV C    06/22/2012

## 2012-06-22 NOTE — Progress Notes (Signed)
ID: Rebecca Pugh   DOB: 07-11-1950  MR#: [id number]  YNW#:[id number]  PCP: [name], MD GYN: SU: [name] OTHER MD:  HISTORY OF PRESENT ILLNESS: The patient and her husband, Rebecca Pugh, moved back to this area recently from [facility] (Rebecca Pugh actually is a native of [place] and incidentally remains a [employer] fan).  As part of moving boxes, Rebecca Pugh felt something under her left arm.  She brought it to [name] attention and although the patient states that she has always had some cyclical swelling of her left axilla during menstruation, [name] set the patient up for evaluation at [facility].  On 12-12-05 the patient had a mammogram and ultrasound there and these showed no mass, distortion or microcalcifications in either breast however, in the left axilla there was a large macrolobulated mass and just above that there was a slightly prominent lymph node which did demonstrate a fatty hilum.  Physically on exam the mass was mobile and nontender and measured approximately 6.5 cm.  The ultrasound showed two small hypoechoic nodules in the left breast 3 cm. from the nipple measuring 7 mm. each and felt to be most likely benign however, the axillary mass, of course, was quite suspicious and biopsy was performed the same day.  This showed ([id number] and [id number]) invasive breast cancer which was strongly ER positive at 76%, moderately PR positive at 25% with a very high proliferation marker at 66%.  HER-2/neu was 1+ and FISH showed no amplification with a ratio of 1.1.    With this information, the patient was referred to Dr. [name] and on 12-20-05 the patient had bilateral breast MRI's.  In the left breast there was a 5.8 cm. axillary mass and several abnormally enlarged lymph nodes.  There was a 7 mm. left supraclavicular lymph node and the two nodules in the breast that had been noted by ultrasound were also noted by MRI measuring 1.1 and 0.7 cm.  In the right breast there were no  abnormalities noted.  MR guided biopsies of the left breast masses were obtained on 12-27-05 and both showed high grade ductal carcinoma.  There was evidence of lymphovascular invasion and high grade ductal carcinoma in situ as well ([id number]).    [name] discussed the situation with me and we felt it would be useful to have a PET scan prior to definitive surgery.  This was obtained on 12-30-05 and showed the primary axillary mass to have an SUV of 8.5.  The lymph nodes in the left axilla had SUV's in the 2.2 range, small focus of increased activity in the left breast had an SUV of 2.3.  There was also skin thickening with mildly increased diffuse FDG uptake within the skin suggesting an inflammatory breast cancer.  The neck, abdomen and  pelvis were negative.  In the lung windows, there were several scattered small areas of focal nodular density in the upper lobes with compressive atelectasis and this was felt to be benign.  Chest x-ray on 01-09-06 was negative.  With this information, [name] proceeded, after appropriate discussion, to left modified radical mastectomy on 01-15-06 and the insertion of a BARD port at the same time.  The final pathology ([id number]) showed the maximum tumor size to be 5 cm.  Margins were ample.  There was evidence of lymphovascular invasion.  This infiltrating ductal carcinoma was a grade 3 of 3 and 11 out of 14 lymph nodes were involved.  There was evidence of extracapsular extension.  Her subsequent history is as detailed below.   INTERVAL HISTORY: Monai returns today with her husband Rebecca Pugh for followup of her metastatic breast carcinoma. Today is day 8 cycle 1 of her docetaxel/trastuzumab/pertuzumab.  REVIEW OF SYSTEMS: Jackelynn feels absolutely terrible. She was severely fatigued and had to be examined lying down on the table today. She has been unable to keep anything down, including in sure. She can't sleep. She aches all over. She has not had any fever, although  she has had some chills. She has not had cough, phlegm production pleurisy, or any problems with bleeding or bruising. A detailed review of systems was otherwise stable.  PAST MEDICAL HISTORY: Past Medical History  Diagnosis Date  . Hypertension   . Depression   . breast ca dx'd 01/15/2006    chemo/xrt comp  remote tonsillectomy, remote cholecystectomy, status post appendectomy, multiple skin biopsies for what the patient said were simple keratoses, and status post port placement.  FAMILY HISTORY: The patient has no information regarding her father. She was brought up by her stepfather. The patient's mother died at the age of 42 with bladder cancer. The patient has one sister who is in good health. There is no history of breast or ovarian cancer in the family to the patient's knowledge.   GYNECOLOGIC HISTORY: She is GX, P2. She was premenopausal at the time of her initial breast cancer diagnosis  SOCIAL HISTORY: She and her husband, Rebecca Pugh, have been married 10 years. 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 She has two children, a daughter, Rebecca Pugh, who lives in [place] and is a Investment banker, corporate, and a son, Rebecca Pugh, also in [place], who works for [employer]. She has two grandchildren and three step-grandchildren from her own two children, [name] and Rebecca Pugh, who are from her first marriage. Rebecca Pugh has one daughter from his first marriage and he has grandchildren through her. The patient was brought up as a Catholic but currently is not practicing. She and Rebecca Pugh are attending a [employer].      ADVANCED DIRECTIVES:  HEALTH MAINTENANCE: History  Substance Use Topics  . Smoking status: Former Smoker -- 20 years    Quit date: 01/31/1995  . Smokeless tobacco: Never Used  . Alcohol Use: Yes     Comment: occasionl     Colonoscopy:  PAP:  Bone density: August 2012, "Normal"  Lipid panel:  No Known  Allergies  Current Outpatient Prescriptions  Medication Sig Dispense Refill  . calcium citrate (CALCITRATE - DOSED IN MG ELEMENTAL CALCIUM) 950 MG tablet Take 1 tablet by mouth daily.        . cholecalciferol (VITAMIN D) 1000 UNITS tablet Take 3,000 Units by mouth daily.        Marland Kitchen dexamethasone (DECADRON) 4 MG tablet Take 2 tablets (8 mg total) by mouth 2 (two) times daily with a meal.  60 tablet  1  . FLUoxetine (PROZAC) 40 MG capsule Take 1 capsule (40 mg total) by mouth daily.  30 capsule  3  . lidocaine-prilocaine (EMLA) cream Apply topically as needed.  30 g  3  . loperamide (IMODIUM) 2 MG capsule Take 2 mg by mouth 4 (four) times daily as needed. diarrhea      . LORazepam (ATIVAN) 0.5 MG tablet Take 1 tablet (0.5 mg total) by mouth 2 (two) times daily as needed.  60 tablet  0  . losartan (COZAAR) 50 MG  tablet Take 1 tablet (50 mg total) by mouth daily.  30 tablet  6  . ondansetron (ZOFRAN) 8 MG tablet Take 1 tablet (8 mg total) by mouth 2 (two) times daily as needed for nausea.  20 tablet  0  . oxyCODONE (OXY IR/ROXICODONE) 5 MG immediate release tablet       . potassium chloride (KLOR-CON) 20 MEQ packet Take 20 mEq by mouth daily.       . prochlorperazine (COMPAZINE) 10 MG tablet Take 1 tablet (10 mg total) by mouth every 6 (six) hours as needed.  30 tablet  3  . traMADol (ULTRAM) 50 MG tablet        No current facility-administered medications for this visit.   Facility-Administered Medications Ordered in Other Visits  Medication Dose Route Frequency Provider Last Rate Last Dose  . cloNIDine (CATAPRES) tablet 0.2 mg  0.2 mg Oral Once Catalina Gravel, PA         OBJECTIVE:   Filed Vitals:   06/22/12 1523  BP: 147/82  Pulse: 89  Temp: 97 F (36.1 C)  Resp: 20     Body mass index is 29.42 kg/(m^2).    ECOG FS: 1 Filed Weights   06/22/12 1523  Weight: 171 lb 6.4 oz (77.747 kg)   Middle-aged white woman examined supine  Sclerae unicteric Oropharynx clear, dry Lungs no rales or  rhonchi Heart regular rate and rhythm Abd benign Neuro: nonfocal Breasts: Deferred  LAB RESULTS:  Lab Results  Component Value Date   WBC 0.6* 06/22/2012   NEUTROABS 0.0* 06/22/2012   HGB 12.2 06/22/2012   HCT 35.2 06/22/2012   MCV 85.0 06/22/2012   PLT 247 06/22/2012      Chemistry      Component Value Date/Time   NA 139 06/12/2012 0846   NA 136 02/01/2012 0500   NA 142 11/05/2010 1036   K 4.6 06/12/2012 0846   K 4.5 02/01/2012 0500   K 4.5 11/05/2010 1036   CL 103 06/12/2012 0846   CL 102 02/01/2012 0500   CL 99 11/05/2010 1036   CO2 25 06/12/2012 0846   CO2 25 02/01/2012 0500   CO2 26 11/05/2010 1036   BUN 13.0 06/12/2012 0846   BUN 13 02/01/2012 0500   BUN 14 11/05/2010 1036   CREATININE 0.9 06/12/2012 0846   CREATININE 0.97 02/01/2012 0500   CREATININE 0.8 11/05/2010 1036      Component Value Date/Time   CALCIUM 9.2 06/12/2012 0846   CALCIUM 8.6 02/01/2012 0500   CALCIUM 8.8 11/05/2010 1036   ALKPHOS 120 06/12/2012 0846   ALKPHOS 88 01/08/2012 0916   ALKPHOS 106* 11/05/2010 1036   AST 15 06/12/2012 0846   AST 24 01/08/2012 0916   AST 27 11/05/2010 1036   ALT 14 06/12/2012 0846   ALT 27 01/08/2012 0916   BILITOT 0.91 06/12/2012 0846   BILITOT 0.9 01/08/2012 0916   BILITOT 0.80 11/05/2010 1036       Lab Results  Component Value Date   LABCA2 73* 06/12/2012    STUDIES: Echocardiogram 05/21/2012 continues to show a well preserved ejection fraction of 55 - 60 %.    ASSESSMENT: 62 year old [place] woman with stage IV breast cancer.  1. 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 2. Status post 4 cycles of adjuvant docetaxel, carboplatin and trastuzumab completed November 2007. 3. Postmastectomy radiation completed February 2008. 4. Anastrozole begun  February 2008 (the trastuzumab also was continued to complete a year), continued until June 2012.  5. Biopsy-proven  metastatic disease to the left adrenal gland, June 2012, with left retrocrural and retroperitoneal adenopathy.  6. On tamoxifen/lapatinib/trastuzumab between June 2012 and October 2012.  7. Ixempra/lapatinib/trastuzumab started October 2012, the Ixempra discontinued in February 2013 due to peripheral neuropathy. 8. Continuing on lapatinib/trastuzumab with the addition of fulvestrant, first injections given on 07/31/2011. Lapatinib given at a dose of 1 g (4 tablets) daily, and trastuzumab given at 8 mg/kg every 4 weeks to coordinate with injection appointments. 9. Discontinued lapatinib and fulvestrant in December 2013, and initiated q. three-week docetaxel/trastuzumab/pertuzumab, first dose given 06/15/2012.  PLAN:  Daelynn is not going to be able to tolerate full dose docetaxel. We are going to cut the dose in half and see how she does with that. Given her 0.00% neutrophils I am going to add Neulasta to the treatment regimen as well.  Today I am putting her on Cipro 500 mg twice a day, and also she will receive IV fluids in the clinic. She will let us know if she needs further fluids over the next 2 days. She understands she can expect some aches and pains from the Neulasta, and she was advised to take Advil for that. She understands the need to call for fever.  She has an appointment with me for January 13. If all is going well at that time she can cancel that appointment and simply see [place] on the 20th. With her next treatment though, we will drop the docetaxel dose in half and add Neulasta on day 2  MAGRINAT,GUSTAV C    06/22/2012

## 2012-06-26 ENCOUNTER — Telehealth: Payer: Self-pay | Admitting: Oncology

## 2012-06-26 NOTE — Telephone Encounter (Signed)
pt called in and stated that she did not need to keep the appt on 1/13      anne

## 2012-06-29 ENCOUNTER — Other Ambulatory Visit: Payer: Medicare Other | Admitting: Lab

## 2012-06-29 ENCOUNTER — Ambulatory Visit: Payer: Medicare Other | Admitting: Oncology

## 2012-07-06 ENCOUNTER — Encounter: Payer: Self-pay | Admitting: Physician Assistant

## 2012-07-06 ENCOUNTER — Ambulatory Visit (HOSPITAL_BASED_OUTPATIENT_CLINIC_OR_DEPARTMENT_OTHER): Payer: Medicare Other

## 2012-07-06 ENCOUNTER — Ambulatory Visit (HOSPITAL_BASED_OUTPATIENT_CLINIC_OR_DEPARTMENT_OTHER): Payer: Medicare Other | Admitting: Physician Assistant

## 2012-07-06 ENCOUNTER — Other Ambulatory Visit (HOSPITAL_BASED_OUTPATIENT_CLINIC_OR_DEPARTMENT_OTHER): Payer: Medicare Other | Admitting: Lab

## 2012-07-06 ENCOUNTER — Telehealth: Payer: Self-pay | Admitting: Oncology

## 2012-07-06 ENCOUNTER — Other Ambulatory Visit: Payer: Medicare Other | Admitting: Lab

## 2012-07-06 VITALS — BP 145/73 | HR 80 | Temp 98.1°F | Resp 20 | Ht 64.0 in | Wt 171.4 lb

## 2012-07-06 DIAGNOSIS — C50919 Malignant neoplasm of unspecified site of unspecified female breast: Secondary | ICD-10-CM

## 2012-07-06 DIAGNOSIS — F419 Anxiety disorder, unspecified: Secondary | ICD-10-CM

## 2012-07-06 DIAGNOSIS — R197 Diarrhea, unspecified: Secondary | ICD-10-CM

## 2012-07-06 DIAGNOSIS — G609 Hereditary and idiopathic neuropathy, unspecified: Secondary | ICD-10-CM

## 2012-07-06 DIAGNOSIS — Z5112 Encounter for antineoplastic immunotherapy: Secondary | ICD-10-CM

## 2012-07-06 DIAGNOSIS — Z5111 Encounter for antineoplastic chemotherapy: Secondary | ICD-10-CM

## 2012-07-06 DIAGNOSIS — C7970 Secondary malignant neoplasm of unspecified adrenal gland: Secondary | ICD-10-CM

## 2012-07-06 LAB — CBC WITH DIFFERENTIAL/PLATELET
BASO%: 0.1 % (ref 0.0–2.0)
EOS%: 0 % (ref 0.0–7.0)
HCT: 31.2 % — ABNORMAL LOW (ref 34.8–46.6)
MCH: 27.8 pg (ref 25.1–34.0)
MCHC: 33 g/dL (ref 31.5–36.0)
MONO#: 0.5 10*3/uL (ref 0.1–0.9)
RBC: 3.71 10*6/uL (ref 3.70–5.45)
RDW: 14.1 % (ref 11.2–14.5)
WBC: 15.6 10*3/uL — ABNORMAL HIGH (ref 3.9–10.3)
lymph#: 1.1 10*3/uL (ref 0.9–3.3)
nRBC: 0 % (ref 0–0)

## 2012-07-06 LAB — COMPREHENSIVE METABOLIC PANEL (CC13)
ALT: 10 U/L (ref 0–55)
AST: 10 U/L (ref 5–34)
Albumin: 3.1 g/dL — ABNORMAL LOW (ref 3.5–5.0)
Alkaline Phosphatase: 115 U/L (ref 40–150)
BUN: 14 mg/dL (ref 7.0–26.0)
Calcium: 9.4 mg/dL (ref 8.4–10.4)
Chloride: 104 mEq/L (ref 98–107)
Potassium: 3.8 mEq/L (ref 3.5–5.1)
Sodium: 140 mEq/L (ref 136–145)
Total Protein: 6.5 g/dL (ref 6.4–8.3)

## 2012-07-06 LAB — CANCER ANTIGEN 27.29: CA 27.29: 85 U/mL — ABNORMAL HIGH (ref 0–39)

## 2012-07-06 MED ORDER — DEXTROSE 5 % IV SOLN
40.0000 mg/m2 | Freq: Once | INTRAVENOUS | Status: AC
  Administered 2012-07-06: 80 mg via INTRAVENOUS
  Filled 2012-07-06: qty 8

## 2012-07-06 MED ORDER — TOBRAMYCIN-DEXAMETHASONE 0.3-0.1 % OP SUSP: 1.0000 [drp] | Freq: Two times a day (BID) | OPHTHALMIC | Status: DC

## 2012-07-06 MED ORDER — DIPHENHYDRAMINE HCL 25 MG PO CAPS
25.0000 mg | ORAL_CAPSULE | Freq: Once | ORAL | Status: AC
  Administered 2012-07-06: 25 mg via ORAL

## 2012-07-06 MED ORDER — SODIUM CHLORIDE 0.9 % IV SOLN
420.0000 mg | Freq: Once | INTRAVENOUS | Status: AC
  Administered 2012-07-06: 420 mg via INTRAVENOUS
  Filled 2012-07-06: qty 14

## 2012-07-06 MED ORDER — HEPARIN SOD (PORK) LOCK FLUSH 100 UNIT/ML IV SOLN
500.0000 [IU] | Freq: Once | INTRAVENOUS | Status: DC | PRN
  Filled 2012-07-06: qty 5

## 2012-07-06 MED ORDER — CHOLESTYRAMINE 4 G PO PACK: 1.0000 | PACK | Freq: Two times a day (BID) | ORAL | Status: DC

## 2012-07-06 MED ORDER — ACETAMINOPHEN 325 MG PO TABS
650.0000 mg | ORAL_TABLET | Freq: Once | ORAL | Status: AC
  Administered 2012-07-06: 650 mg via ORAL

## 2012-07-06 MED ORDER — ONDANSETRON 16 MG/50ML IVPB (CHCC)
16.0000 mg | Freq: Once | INTRAVENOUS | Status: AC
  Administered 2012-07-06: 16 mg via INTRAVENOUS

## 2012-07-06 MED ORDER — DEXAMETHASONE SODIUM PHOSPHATE 10 MG/ML IJ SOLN
10.0000 mg | Freq: Once | INTRAMUSCULAR | Status: AC
  Administered 2012-07-06: 10 mg via INTRAVENOUS

## 2012-07-06 MED ORDER — TRASTUZUMAB CHEMO INJECTION 440 MG
6.0000 mg/kg | Freq: Once | INTRAVENOUS | Status: AC
  Administered 2012-07-06: 504 mg via INTRAVENOUS
  Filled 2012-07-06: qty 24

## 2012-07-06 MED ORDER — SODIUM CHLORIDE 0.9 % IV SOLN
Freq: Once | INTRAVENOUS | Status: AC
  Administered 2012-07-06: 10:00:00 via INTRAVENOUS

## 2012-07-06 NOTE — Telephone Encounter (Signed)
gv pt appt schedule for January and February. °

## 2012-07-06 NOTE — Patient Instructions (Addendum)
[facility] Discharge Instructions for Patients Receiving Chemotherapy  Today you received the following chemotherapy agents taxotere/herceptin/perjeta  To help prevent nausea and vomiting after your treatment, we encourage you to take your nausea medication  and take it as often as prescribed   If you develop nausea and vomiting that is not controlled by your nausea medication, call the clinic. If it is after clinic hours your family physician or the after hours number for the clinic or go to the Emergency Department.   BELOW ARE SYMPTOMS THAT SHOULD BE REPORTED IMMEDIATELY:  *FEVER GREATER THAN 100.5 F  *CHILLS WITH OR WITHOUT FEVER  NAUSEA AND VOMITING THAT IS NOT CONTROLLED WITH YOUR NAUSEA MEDICATION  *UNUSUAL SHORTNESS OF BREATH  *UNUSUAL BRUISING OR BLEEDING  TENDERNESS IN MOUTH AND THROAT WITH OR WITHOUT PRESENCE OF ULCERS  *URINARY PROBLEMS  *BOWEL PROBLEMS  UNUSUAL RASH Items with * indicate a potential emergency and should be followed up as soon as possible.  One of the nurses will contact you 24 hours after your treatment. Please let the nurse know about any problems that you may have experienced. Feel free to call the clinic you have any questions or concerns. The clinic phone number is [phone].   I have been informed and understand all the instructions given to me. I know to contact the clinic, my physician, or go to the Emergency Department if any problems should occur. I do not have any questions at this time, but understand that I may call the clinic during office hours   should I have any questions or need assistance in obtaining follow up care.    __________________________________________  _____________  __________ Signature of Patient or Authorized Representative            Date                   Time    __________________________________________ Nurse's Signature

## 2012-07-06 NOTE — Progress Notes (Signed)
ID: [name]   DOB: 11-27-1950  MR#: [id number]  WUJ#:[id number]  PCP: [name], MD GYN: SU: [name] OTHER MD:  HISTORY OF PRESENT ILLNESS: The patient and her husband, Rebecca Pugh, moved back to this area recently from [facility] (Rebecca Pugh actually is a native of [place] and incidentally remains a [employer] fan).  As part of moving boxes, [name] felt something under her left arm.  She brought it to [name] attention and although the patient states that she has always had some cyclical swelling of her left axilla during menstruation, [name] set the patient up for evaluation at [facility].  On 12-12-05 the patient had a mammogram and ultrasound there and these showed no mass, distortion or microcalcifications in either breast however, in the left axilla there was a large macrolobulated mass and just above that there was a slightly prominent lymph node which did demonstrate a fatty hilum.  Physically on exam the mass was mobile and nontender and measured approximately 6.5 cm.  The ultrasound showed two small hypoechoic nodules in the left breast 3 cm. from the nipple measuring 7 mm. each and felt to be most likely benign however, the axillary mass, of course, was quite suspicious and biopsy was performed the same day.  This showed ([id number] and [id number]) invasive breast cancer which was strongly ER positive at 76%, moderately PR positive at 25% with a very high proliferation marker at 66%.  HER-2/neu was 1+ and FISH showed no amplification with a ratio of 1.1.    With this information, the patient was referred to Dr. [name] and on 12-20-05 the patient had bilateral breast MRI's.  In the left breast there was a 5.8 cm. axillary mass and several abnormally enlarged lymph nodes.  There was a 7 mm. left supraclavicular lymph node and the two nodules in the breast that had been noted by ultrasound were also noted by MRI measuring 1.1 and 0.7 cm.  In the right breast there were no  abnormalities noted.  MR guided biopsies of the left breast masses were obtained on 12-27-05 and both showed high grade ductal carcinoma.  There was evidence of lymphovascular invasion and high grade ductal carcinoma in situ as well ([id number]).    [name] discussed the situation with me and we felt it would be useful to have a PET scan prior to definitive surgery.  This was obtained on 12-30-05 and showed the primary axillary mass to have an SUV of 8.5.  The lymph nodes in the left axilla had SUV's in the 2.2 range, small focus of increased activity in the left breast had an SUV of 2.3.  There was also skin thickening with mildly increased diffuse FDG uptake within the skin suggesting an inflammatory breast cancer.  The neck, abdomen and  pelvis were negative.  In the lung windows, there were several scattered small areas of focal nodular density in the upper lobes with compressive atelectasis and this was felt to be benign.  Chest x-ray on 01-09-06 was negative.  With this information, [name] proceeded, after appropriate discussion, to left modified radical mastectomy on 01-15-06 and the insertion of a BARD port at the same time.  The final pathology ([id number]) showed the maximum tumor size to be 5 cm.  Margins were ample.  There was evidence of lymphovascular invasion.  This infiltrating ductal carcinoma was a grade 3 of 3 and 11 out of 14 lymph nodes were involved.  There was evidence of extracapsular extension.  Her subsequent history is as detailed below.   INTERVAL HISTORY: Rebecca Pugh returns today with her husband Rebecca Pugh for followup of her metastatic breast carcinoma. Today is day 1 cycle 2 of her docetaxel/trastuzumab/pertuzumab.  Rebecca Pugh tolerated cycle 1 very poorly, had an ANC of 0.0, and the decision was made to decrease her docetaxel by 50% with cycle 2. [name] would also like to add Neulasta with cycle 2, to be given on day 2.  Rebecca Pugh is feeling better today than she was at her visit 2  weeks ago. She is eating and drinking a little better and is better hydrated. Unfortunately she is having some mild abdominal cramping and frequent diarrhea. She completed a course of Cipro for neutropenia. She denies any mucus or blood in the stool. Fortunately, she denies any nausea or emesis.  Rebecca Pugh has also noted that her eyes have started to walk her again. She is currently not utilizing TobraDex eyedrops.   REVIEW OF SYSTEMS: Rebecca Pugh denies any fevers or chills. She is still very fatigued. She has kept herself "isolated" over the past 2 weeks. She denies any cough, phlegm production, pleurisy, shortness of breath, or chest pain. She's had no abnormal headaches or dizziness. She has no body aches currently, and denies any unusual myalgias, arthralgias, bony pain. No peripheral swelling. She's had no increased peripheral neuropathy.  A detailed review of systems was otherwise stable.  PAST MEDICAL HISTORY: Past Medical History  Diagnosis Date  . Hypertension   . Depression   . breast ca dx'd 01/15/2006    chemo/xrt comp  remote tonsillectomy, remote cholecystectomy, status post appendectomy, multiple skin biopsies for what the patient said were simple keratoses, and status post port placement.  FAMILY HISTORY: The patient has no information regarding her father. She was brought up by her stepfather. The patient's mother died at the age of 1 with bladder cancer. The patient has one sister who is in good health. There is no history of breast or ovarian cancer in the family to the patient's knowledge.   GYNECOLOGIC HISTORY: She is GX, P2. She was premenopausal at the time of her initial breast cancer diagnosis  SOCIAL HISTORY: She and her husband, Rebecca Pugh, have been married 10 years. 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 She has two children, a daughter, Rebecca Pugh, who lives in [place] and is a Investment banker, corporate, and  a son, Rebecca Pugh, also in [place], who works for [employer]. She has two grandchildren and three step-grandchildren from her own two children, Rebecca Pugh and Rebecca Pugh, who are from her first marriage. Rebecca Pugh has one daughter from his first marriage and he has grandchildren through her. The patient was brought up as a Catholic but currently is not practicing. She and Rebecca Pugh are attending a [employer].      ADVANCED DIRECTIVES:  HEALTH MAINTENANCE: History  Substance Use Topics  . Smoking status: Former Smoker -- 20 years    Quit date: 01/31/1995  . Smokeless tobacco: Never Used  . Alcohol Use: Yes     Comment: occasionl     Colonoscopy:  PAP:  Bone density: August 2012, "Normal"  Lipid panel:  No Known Allergies  Current Outpatient Prescriptions  Medication Sig Dispense Refill  . calcium citrate (CALCITRATE - DOSED IN MG ELEMENTAL CALCIUM) 950 MG tablet Take 1 tablet by mouth daily.        . cholecalciferol (VITAMIN D) 1000 UNITS tablet Take  3,000 Units by mouth daily.        . cholestyramine (QUESTRAN) 4 G packet Take 1 packet by mouth 2 (two) times daily with a meal. Mix with water.  60 each  1  . ciprofloxacin (CIPRO) 500 MG tablet Take 1 tablet (500 mg total) by mouth 2 (two) times daily.  10 tablet  1  . dexamethasone (DECADRON) 4 MG tablet Take 2 tablets (8 mg total) by mouth 2 (two) times daily with a meal.  60 tablet  1  . FLUoxetine (PROZAC) 40 MG capsule Take 1 capsule (40 mg total) by mouth daily.  30 capsule  3  . lidocaine-prilocaine (EMLA) cream Apply topically as needed.  30 g  3  . loperamide (IMODIUM) 2 MG capsule Take 2 mg by mouth 4 (four) times daily as needed. diarrhea      . LORazepam (ATIVAN) 0.5 MG tablet Take 1 tablet (0.5 mg total) by mouth 2 (two) times daily as needed.  60 tablet  0  . losartan (COZAAR) 50 MG tablet Take 1 tablet (50 mg total) by mouth daily.  30 tablet  6  . ondansetron (ZOFRAN) 8 MG tablet Take 1 tablet (8 mg total) by mouth 2 (two)  times daily as needed for nausea.  20 tablet  0  . oxyCODONE (OXY IR/ROXICODONE) 5 MG immediate release tablet       . potassium chloride (KLOR-CON) 20 MEQ packet Take 20 mEq by mouth daily.       . prochlorperazine (COMPAZINE) 10 MG tablet Take 1 tablet (10 mg total) by mouth every 6 (six) hours as needed.  30 tablet  3  . tobramycin-dexamethasone (TOBRADEX) ophthalmic solution Place 1 drop into both eyes 2 (two) times daily.  5 mL  1  . traMADol (ULTRAM) 50 MG tablet        No current facility-administered medications for this visit.   Facility-Administered Medications Ordered in Other Visits  Medication Dose Route Frequency Provider Last Rate Last Dose  . cloNIDine (CATAPRES) tablet 0.2 mg  0.2 mg Oral Once Loie Jahr Allegra Grana, PA      . heparin lock flush 100 unit/mL  500 Units Intracatheter Once PRN Lowella Dell, MD         OBJECTIVE:   Filed Vitals:   07/06/12 0847  BP: 145/73  Pulse: 80  Temp: 98.1 F (36.7 C)  Resp: 20     Body mass index is 29.42 kg/(m^2).    ECOG FS: 1 Filed Weights   07/06/12 0847  Weight: 171 lb 6.4 oz (77.747 kg)   Sclerae unicteric Oropharynx clear Lungs are clear to auscultation, no rales or rhonchi Heart regular rate and rhythm Slight adenopathy palpated in the left supraclavicular area/left anterior neck, but notes appear softer, more movable, and smaller than with previous exams.  Abdomen is soft, nontender to palpation, with positive bowel sounds. Bowel sounds are slightly hyperactive. Neuro: nonfocal, well oriented positive affect Musculoskeletal, no focal spinal tenderness to gentle palpation. No peripheral edema Breasts: Deferred  LAB RESULTS:  Lab Results  Component Value Date   WBC 15.6* 07/06/2012   NEUTROABS 14.0* 07/06/2012   HGB 10.3* 07/06/2012   HCT 31.2* 07/06/2012   MCV 84.1 07/06/2012   PLT 340 07/06/2012      Chemistry      Component Value Date/Time   NA 140 07/06/2012 0816   NA 136 02/01/2012 0500   NA 142 11/05/2010 1036     K 3.8 07/06/2012 0816  K 4.5 02/01/2012 0500   K 4.5 11/05/2010 1036   CL 104 07/06/2012 0816   CL 102 02/01/2012 0500   CL 99 11/05/2010 1036   CO2 25 07/06/2012 0816   CO2 25 02/01/2012 0500   CO2 26 11/05/2010 1036   BUN 14.0 07/06/2012 0816   BUN 13 02/01/2012 0500   BUN 14 11/05/2010 1036   CREATININE 0.9 07/06/2012 0816   CREATININE 0.97 02/01/2012 0500   CREATININE 0.8 11/05/2010 1036      Component Value Date/Time   CALCIUM 9.4 07/06/2012 0816   CALCIUM 8.6 02/01/2012 0500   CALCIUM 8.8 11/05/2010 1036   ALKPHOS 115 07/06/2012 0816   ALKPHOS 88 01/08/2012 0916   ALKPHOS 106* 11/05/2010 1036   AST 10 07/06/2012 0816   AST 24 01/08/2012 0916   AST 27 11/05/2010 1036   ALT 10 07/06/2012 0816   ALT 27 01/08/2012 0916   BILITOT 0.48 07/06/2012 0816   BILITOT 0.9 01/08/2012 0916   BILITOT 0.80 11/05/2010 1036       Lab Results  Component Value Date   LABCA2 85* 07/06/2012    STUDIES: Echocardiogram 05/21/2012 continues to show a well preserved ejection fraction of 55 - 60 %.    ASSESSMENT: 62 year old [place] woman with stage IV breast cancer.  1. 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 2. Status post 4 cycles of adjuvant docetaxel, carboplatin and trastuzumab completed November 2007. 3. Postmastectomy radiation completed February 2008. 4. Anastrozole begun February 2008 (the trastuzumab also was continued to complete a year), continued until June 2012.  5. 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  6. On tamoxifen/lapatinib/trastuzumab between June 2012 and October 2012.  7. Ixempra/lapatinib/trastuzumab started October 2012, the Ixempra discontinued in February 2013 due to peripheral neuropathy. 8. Continuing on lapatinib/trastuzumab with the addition of fulvestrant, first injections given  on 07/31/2011. Lapatinib given at a dose of 1 g (4 tablets) daily, and trastuzumab given at 8 mg/kg every 4 weeks to coordinate with injection appointments. 9. Discontinued lapatinib and fulvestrant in December 2013, and initiated q. three-week docetaxel/trastuzumab/pertuzumab, first dose given 06/15/2012.  PLAN:  This case was reviewed with [name] this morning. Rebecca Pugh will proceed to treatment today as scheduled, but the docetaxel will be given at a 50% dose reduction.  I am prescribing TobraDex for mild peripheral, and Questran for diarrhea. Of course with a recent course of antibiotics there is some concern about C. difficile, and we'll request a stool culture and C. difficile toxin. She'll return tomorrow for her Neulasta injection. In the future, she would like to receive those injections in [place], but since she will be bringing in a stool specimen tomorrow, she decided to keep the appointment here with this cycle.   I will see Tiamarie again next week on January 27 for assessment of chemotoxicity, and her next dose of chemotherapy will be scheduled for February 10. All this was reviewed in detail with Rebecca Pugh and Rebecca Pugh today, both of whom voice understanding and agreement. They know to call with any changes or problems.  Of note, once we discontinue Trust's chemotherapy we will likely proceed with either Aromasin or letrozole.  Chantelle Verdi    07/06/2012

## 2012-07-07 ENCOUNTER — Other Ambulatory Visit: Payer: Self-pay | Admitting: Physician Assistant

## 2012-07-07 ENCOUNTER — Ambulatory Visit (HOSPITAL_BASED_OUTPATIENT_CLINIC_OR_DEPARTMENT_OTHER): Payer: Medicare Other

## 2012-07-07 VITALS — BP 121/65 | HR 77 | Temp 97.7°F

## 2012-07-07 DIAGNOSIS — C50919 Malignant neoplasm of unspecified site of unspecified female breast: Secondary | ICD-10-CM

## 2012-07-07 DIAGNOSIS — R197 Diarrhea, unspecified: Secondary | ICD-10-CM

## 2012-07-07 MED ORDER — PEGFILGRASTIM INJECTION 6 MG/0.6ML
6.0000 mg | Freq: Once | SUBCUTANEOUS | Status: AC
  Administered 2012-07-07: 6 mg via SUBCUTANEOUS
  Filled 2012-07-07: qty 0.6

## 2012-07-13 ENCOUNTER — Telehealth: Payer: Self-pay | Admitting: Oncology

## 2012-07-13 ENCOUNTER — Other Ambulatory Visit (HOSPITAL_BASED_OUTPATIENT_CLINIC_OR_DEPARTMENT_OTHER): Payer: Medicare Other | Admitting: Lab

## 2012-07-13 ENCOUNTER — Encounter: Payer: Self-pay | Admitting: Physician Assistant

## 2012-07-13 ENCOUNTER — Ambulatory Visit (HOSPITAL_BASED_OUTPATIENT_CLINIC_OR_DEPARTMENT_OTHER): Payer: Medicare Other | Admitting: Physician Assistant

## 2012-07-13 VITALS — BP 145/80 | HR 91 | Temp 98.1°F | Resp 20 | Ht 64.0 in | Wt 165.3 lb

## 2012-07-13 DIAGNOSIS — C7970 Secondary malignant neoplasm of unspecified adrenal gland: Secondary | ICD-10-CM

## 2012-07-13 DIAGNOSIS — C50919 Malignant neoplasm of unspecified site of unspecified female breast: Secondary | ICD-10-CM

## 2012-07-13 DIAGNOSIS — Z170 Estrogen receptor positive status [ER+]: Secondary | ICD-10-CM

## 2012-07-13 LAB — CBC WITH DIFFERENTIAL/PLATELET
Basophils Absolute: 0.1 10*3/uL (ref 0.0–0.1)
EOS%: 0.8 % (ref 0.0–7.0)
HCT: 32.8 % — ABNORMAL LOW (ref 34.8–46.6)
HGB: 10.7 g/dL — ABNORMAL LOW (ref 11.6–15.9)
MCH: 28.1 pg (ref 25.1–34.0)
MCV: 86.1 fL (ref 79.5–101.0)
MONO%: 8.7 % (ref 0.0–14.0)
NEUT%: 78.6 % — ABNORMAL HIGH (ref 38.4–76.8)
lymph#: 1.8 10*3/uL (ref 0.9–3.3)

## 2012-07-13 NOTE — Telephone Encounter (Signed)
gv pt appt schedule for February and March.  °

## 2012-07-13 NOTE — Progress Notes (Signed)
ID: Rebecca Pugh   DOB: October 12, 1950  MR#: [id number]  CSN#:[id number]  PCP: Rebecca Bur, MD GYN: SU: Rebecca Pugh OTHER MD:  HISTORY OF PRESENT ILLNESS: The patient and her husband, Rebecca Pugh, moved back to this area recently from [facility] (Rebecca Pugh actually is a native of [place] and incidentally remains a [employer] fan).  As part of moving boxes, Rebecca Pugh felt something under her left arm.  She brought it to Dr. Lovena Pugh attention and although the patient states that she has always had some cyclical swelling of her left axilla during menstruation, Dr. Lalla Pugh set the patient up for evaluation at [facility].  On 12-12-05 the patient had a mammogram and ultrasound there and these showed no mass, distortion or microcalcifications in either breast however, in the left axilla there was a large macrolobulated mass and just above that there was a slightly prominent lymph node which did demonstrate a fatty hilum.  Physically on exam the mass was mobile and nontender and measured approximately 6.5 cm.  The ultrasound showed two small hypoechoic nodules in the left breast 3 cm. from the nipple measuring 7 mm. each and felt to be most likely benign however, the axillary mass, of course, was quite suspicious and biopsy was performed the same day.  This showed ([id number] and [id number]) invasive breast cancer which was strongly ER positive at 76%, moderately PR positive at 25% with a very high proliferation marker at 66%.  HER-2/neu was 1+ and FISH showed no amplification with a ratio of 1.1.    With this information, the patient was referred to Dr. Claud Pugh and on 12-20-05 the patient had bilateral breast MRI's.  In the left breast there was a 5.8 cm. axillary mass and several abnormally enlarged lymph nodes.  There was a 7 mm. left supraclavicular lymph node and the two nodules in the breast that had been noted by ultrasound were also noted by MRI measuring 1.1 and 0.7 cm.  In the right breast there were no  abnormalities noted.  MR guided biopsies of the left breast masses were obtained on 12-27-05 and both showed high grade ductal carcinoma.  There was evidence of lymphovascular invasion and high grade ductal carcinoma in situ as well ([id number]).    Rebecca Pugh discussed the situation with me and we felt it would be useful to have a PET scan prior to definitive surgery.  This was obtained on 12-30-05 and showed the primary axillary mass to have an SUV of 8.5.  The lymph nodes in the left axilla had SUV's in the 2.2 range, small focus of increased activity in the left breast had an SUV of 2.3.  There was also skin thickening with mildly increased diffuse FDG uptake within the skin suggesting an inflammatory breast cancer.  The neck, abdomen and  pelvis were negative.  In the lung windows, there were several scattered small areas of focal nodular density in the upper lobes with compressive atelectasis and this was felt to be benign.  Chest x-ray on 01-09-06 was negative.  With this information, Rebecca Pugh proceeded, after appropriate discussion, to left modified radical mastectomy on 01-15-06 and the insertion of a BARD port at the same time.  The final pathology ([id number]) showed the maximum tumor size to be 5 cm.  Margins were ample.  There was evidence of lymphovascular invasion.  This infiltrating ductal carcinoma was a grade 3 of 3 and 11 out of 14 lymph nodes were involved.  There was evidence of extracapsular extension.  Her subsequent history is as detailed below.   INTERVAL HISTORY: Rebecca Pugh returns today with her daughter for followup of her metastatic breast carcinoma. Today is day 8 cycle 2 of her docetaxel/trastuzumab/pertuzumab, with docetaxel given at a 50% dose reduction.  Rebecca Pugh also received Neulasta on day 2 for granulocyte support following cycle 2.   Rebecca Pugh tells me she is feeling very well, and has tolerated the second cycle with very few complaints. Her appetite has been better. She had only  one episode of nausea, but no emesis. Diarrhea has resolved. She's had no fevers or chills. She tolerated the Neulasta injection with no significant bony aches or pains. She denies any increased signs of neuropathy.  REVIEW OF SYSTEMS: Rebecca Pugh denies any fevers or chills. She is less fatigued.  She denies any cough, phlegm production, pleurisy, shortness of breath, or chest pain. She's had no abnormal headaches or dizziness. She has no body aches currently, and denies any unusual myalgias, arthralgias, bony pain. No peripheral swelling.   A detailed review of systems was otherwise stable.  PAST MEDICAL HISTORY: Past Medical History  Diagnosis Date  . Hypertension   . Depression   . breast ca dx'd 01/15/2006    chemo/xrt comp  . Breast cancer metastasized to multiple sites 07/13/2012  remote tonsillectomy, remote cholecystectomy, status post appendectomy, multiple skin biopsies for what the patient said were simple keratoses, and status post port placement.  FAMILY HISTORY: The patient has no information regarding her father. She was brought up by her stepfather. The patient's mother died at the age of 74 with bladder cancer. The patient has one sister who is in good health. There is no history of breast or ovarian cancer in the family to the patient's knowledge.   GYNECOLOGIC HISTORY: She is GX, P2. She was premenopausal at the time of her initial breast cancer diagnosis  SOCIAL HISTORY: She and her husband, Rebecca Pugh, have been married 10 years. 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 She has two children, a daughter, Rebecca Pugh, who lives in [place] and is a Investment banker, corporate, and a son, Rebecca Pugh, also in [place], who works for [employer]. She has two grandchildren and three step-grandchildren from her own two children, Rebecca Pugh and Rebecca Pugh, who are from her first marriage. Rebecca Pugh has one daughter from his first marriage  and he has grandchildren through her. The patient was brought up as a Catholic but currently is not practicing. She and Rebecca Pugh are attending a [employer].      ADVANCED DIRECTIVES:  HEALTH MAINTENANCE: History  Substance Use Topics  . Smoking status: Former Smoker -- 20 years    Quit date: 01/31/1995  . Smokeless tobacco: Never Used  . Alcohol Use: Yes     Comment: occasionl     Colonoscopy:  PAP:  Bone density: August 2012, "Normal"  Lipid panel:  No Known Allergies  Current Outpatient Prescriptions  Medication Sig Dispense Refill  . calcium citrate (CALCITRATE - DOSED IN MG ELEMENTAL CALCIUM) 950 MG tablet Take 1 tablet by mouth daily.        . cholecalciferol (VITAMIN D) 1000 UNITS tablet Take 3,000 Units by mouth daily.        . cholestyramine (QUESTRAN) 4 G packet Take 1 packet by mouth 2 (two) times daily with a meal. Mix with water.  60 each  1  . ciprofloxacin (CIPRO) 500 MG tablet Take 1 tablet (500  mg total) by mouth 2 (two) times daily.  10 tablet  1  . dexamethasone (DECADRON) 4 MG tablet Take 2 tablets (8 mg total) by mouth 2 (two) times daily with a meal.  60 tablet  1  . FLUoxetine (PROZAC) 40 MG capsule Take 1 capsule (40 mg total) by mouth daily.  30 capsule  3  . lidocaine-prilocaine (EMLA) cream Apply topically as needed.  30 g  3  . loperamide (IMODIUM) 2 MG capsule Take 2 mg by mouth 4 (four) times daily as needed. diarrhea      . LORazepam (ATIVAN) 0.5 MG tablet Take 1 tablet (0.5 mg total) by mouth 2 (two) times daily as needed.  60 tablet  0  . losartan (COZAAR) 50 MG tablet Take 1 tablet (50 mg total) by mouth daily.  30 tablet  6  . ondansetron (ZOFRAN) 8 MG tablet Take 1 tablet (8 mg total) by mouth 2 (two) times daily as needed for nausea.  20 tablet  0  . oxyCODONE (OXY IR/ROXICODONE) 5 MG immediate release tablet       . potassium chloride (KLOR-CON) 20 MEQ packet Take 20 mEq by mouth daily.       . prochlorperazine (COMPAZINE) 10 MG tablet Take  1 tablet (10 mg total) by mouth every 6 (six) hours as needed.  30 tablet  3  . tobramycin-dexamethasone (TOBRADEX) ophthalmic solution Place 1 drop into both eyes 2 (two) times daily.  5 mL  1  . traMADol (ULTRAM) 50 MG tablet        No current facility-administered medications for this visit.   Facility-Administered Medications Ordered in Other Visits  Medication Dose Route Frequency Provider Last Rate Last Dose  . cloNIDine (CATAPRES) tablet 0.2 mg  0.2 mg Oral Once Catalina Gravel, PA         OBJECTIVE:   Filed Vitals:   07/13/12 0920  BP: 145/80  Pulse: 91  Temp: 98.1 F (36.7 C)  Resp: 20     Body mass index is 28.37 kg/(m^2).    ECOG FS: 1 Filed Weights   07/13/12 0920  Weight: 165 lb 4.8 oz (74.98 kg)   Sclerae unicteric Oropharynx clear Lungs are clear to auscultation, no rales or rhonchi Heart regular rate and rhythm Palpable adenopathy palpated in the left supraclavicular area/left anterior neck, but notes appear softer and smaller than with previous exams.  Abdomen is soft, nontender to palpation, with positive bowel sounds.  Neuro: nonfocal, well oriented, positive affect Musculoskeletal, no focal spinal tenderness to gentle palpation. No peripheral edema Breasts: Deferred  LAB RESULTS:  Lab Results  Component Value Date   WBC 15.7* 07/13/2012   NEUTROABS 12.3* 07/13/2012   HGB 10.7* 07/13/2012   HCT 32.8* 07/13/2012   MCV 86.1 07/13/2012   PLT 250 07/13/2012      Chemistry      Component Value Date/Time   NA 140 07/06/2012 0816   NA 136 02/01/2012 0500   NA 142 11/05/2010 1036   K 3.8 07/06/2012 0816   K 4.5 02/01/2012 0500   K 4.5 11/05/2010 1036   CL 104 07/06/2012 0816   CL 102 02/01/2012 0500   CL 99 11/05/2010 1036   CO2 25 07/06/2012 0816   CO2 25 02/01/2012 0500   CO2 26 11/05/2010 1036   BUN 14.0 07/06/2012 0816   BUN 13 02/01/2012 0500   BUN 14 11/05/2010 1036   CREATININE 0.9 07/06/2012 0816   CREATININE 0.97 02/01/2012 0500  CREATININE 0.8 11/05/2010  1036      Component Value Date/Time   CALCIUM 9.4 07/06/2012 0816   CALCIUM 8.6 02/01/2012 0500   CALCIUM 8.8 11/05/2010 1036   ALKPHOS 115 07/06/2012 0816   ALKPHOS 88 01/08/2012 0916   ALKPHOS 106* 11/05/2010 1036   AST 10 07/06/2012 0816   AST 24 01/08/2012 0916   AST 27 11/05/2010 1036   ALT 10 07/06/2012 0816   ALT 27 01/08/2012 0916   BILITOT 0.48 07/06/2012 0816   BILITOT 0.9 01/08/2012 0916   BILITOT 0.80 11/05/2010 1036       Lab Results  Component Value Date   LABCA2 85* 07/06/2012    STUDIES: Echocardiogram 05/21/2012 continues to show a well preserved ejection fraction of 55 - 60 %.    ASSESSMENT: 62 year old Rebecca Pugh with stage IV breast cancer.  1. 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 2. Status post 4 cycles of adjuvant docetaxel, carboplatin and trastuzumab completed November 2007. 3. Postmastectomy radiation completed February 2008. 4. Anastrozole begun February 2008 (the trastuzumab also was continued to complete a year), continued until June 2012.  5. 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  6. On tamoxifen/lapatinib/trastuzumab between June 2012 and October 2012.  7. Ixempra/lapatinib/trastuzumab started October 2012, the Ixempra discontinued in February 2013 due to peripheral neuropathy. 8. Continuing on lapatinib/trastuzumab with the addition of fulvestrant, first injections given on 07/31/2011. Lapatinib given at a dose of 1 g (4 tablets) daily, and trastuzumab given at 8 mg/kg every 4 weeks to coordinate with injection appointments. 9. Discontinued lapatinib and fulvestrant in December 2013, and initiated q. three-week docetaxel/trastuzumab/pertuzumab, first dose given 06/15/2012.  Docetaxel given at a 50% dose reduction beginning with cycle 2 due to poor tolerance  and severe neutropenia.  PLAN:  Richard tolerated the second cycle extremely well, and the 50% dose reduction and docetaxel seems to have made a huge difference in her tolerance. Her counts have recovered nicely. We will review with [name] whether or not to stay at the current dose, or try to gradually increase the dose of docetaxel with future cycles. Of course Estalene will continue to receive Neulasta on day 2, and would like to receive these injections in [place]. Accordingly, we will get the scheduled for February 11 and March 4.  I will see Lorianna again in 2 weeks on February 10 anticipation of day 1 cycle 3.  Her next echocardiogram will be due in March, and we will get that scheduled when she returns for her next appointment. She voices understanding and agreement, and will call with any changes or problems.   Of note, once we discontinue Allysson's chemotherapy we will likely proceed with either Aromasin or letrozole.  Marquite Attwood    07/13/2012

## 2012-07-20 ENCOUNTER — Ambulatory Visit: Payer: Medicare Other

## 2012-07-20 ENCOUNTER — Other Ambulatory Visit: Payer: Medicare Other | Admitting: Lab

## 2012-07-21 ENCOUNTER — Telehealth: Payer: Self-pay | Admitting: *Deleted

## 2012-07-21 NOTE — Telephone Encounter (Signed)
This RN spoke with pt per request to obtain neulasta at the cancer center in [place]. Informed pt she would need to be seen by their attending MD to be established as a patient to receive injections at their facility.  Per discussion Rebecca Pugh declined proceeding with the above and stated " I will just come to [facility]- it is not that big of a deal ".  This RN left message on identified VM for Rebecca Pugh at the [facility] per above.

## 2012-07-27 ENCOUNTER — Ambulatory Visit (HOSPITAL_BASED_OUTPATIENT_CLINIC_OR_DEPARTMENT_OTHER): Payer: Medicare Other

## 2012-07-27 ENCOUNTER — Other Ambulatory Visit: Payer: Self-pay | Admitting: *Deleted

## 2012-07-27 ENCOUNTER — Encounter: Payer: Self-pay | Admitting: Physician Assistant

## 2012-07-27 ENCOUNTER — Other Ambulatory Visit (HOSPITAL_BASED_OUTPATIENT_CLINIC_OR_DEPARTMENT_OTHER): Payer: Medicare Other | Admitting: Lab

## 2012-07-27 ENCOUNTER — Ambulatory Visit (HOSPITAL_BASED_OUTPATIENT_CLINIC_OR_DEPARTMENT_OTHER): Payer: Medicare Other | Admitting: Physician Assistant

## 2012-07-27 VITALS — Wt 165.0 lb

## 2012-07-27 VITALS — BP 151/76 | HR 86 | Temp 98.1°F | Resp 20 | Ht 64.0 in | Wt 165.0 lb

## 2012-07-27 DIAGNOSIS — C50919 Malignant neoplasm of unspecified site of unspecified female breast: Secondary | ICD-10-CM

## 2012-07-27 DIAGNOSIS — R6881 Early satiety: Secondary | ICD-10-CM

## 2012-07-27 DIAGNOSIS — Z5112 Encounter for antineoplastic immunotherapy: Secondary | ICD-10-CM

## 2012-07-27 DIAGNOSIS — Z5111 Encounter for antineoplastic chemotherapy: Secondary | ICD-10-CM

## 2012-07-27 DIAGNOSIS — D709 Neutropenia, unspecified: Secondary | ICD-10-CM

## 2012-07-27 DIAGNOSIS — F419 Anxiety disorder, unspecified: Secondary | ICD-10-CM

## 2012-07-27 DIAGNOSIS — R110 Nausea: Secondary | ICD-10-CM

## 2012-07-27 LAB — COMPREHENSIVE METABOLIC PANEL (CC13)
BUN: 13.5 mg/dL (ref 7.0–26.0)
CO2: 28 mEq/L (ref 22–29)
Creatinine: 0.9 mg/dL (ref 0.6–1.1)
Glucose: 176 mg/dl — ABNORMAL HIGH (ref 70–99)
Total Bilirubin: 0.58 mg/dL (ref 0.20–1.20)

## 2012-07-27 LAB — CBC WITH DIFFERENTIAL/PLATELET
BASO%: 0.1 % (ref 0.0–2.0)
Basophils Absolute: 0 10*3/uL (ref 0.0–0.1)
Eosinophils Absolute: 0 10*3/uL (ref 0.0–0.5)
HCT: 29.7 % — ABNORMAL LOW (ref 34.8–46.6)
LYMPH%: 16 % (ref 14.0–49.7)
MCHC: 32 g/dL (ref 31.5–36.0)
MONO#: 0.4 10*3/uL (ref 0.1–0.9)
NEUT%: 78.4 % — ABNORMAL HIGH (ref 38.4–76.8)
Platelets: 351 10*3/uL (ref 145–400)
WBC: 7.6 10*3/uL (ref 3.9–10.3)

## 2012-07-27 MED ORDER — SODIUM CHLORIDE 0.9 % IV SOLN
Freq: Once | INTRAVENOUS | Status: AC
  Administered 2012-07-27: 10:00:00 via INTRAVENOUS

## 2012-07-27 MED ORDER — SODIUM CHLORIDE 0.9 % IV SOLN
420.0000 mg | Freq: Once | INTRAVENOUS | Status: AC
  Administered 2012-07-27: 420 mg via INTRAVENOUS
  Filled 2012-07-27: qty 14

## 2012-07-27 MED ORDER — ACETAMINOPHEN 325 MG PO TABS
650.0000 mg | ORAL_TABLET | Freq: Once | ORAL | Status: AC
  Administered 2012-07-27: 650 mg via ORAL

## 2012-07-27 MED ORDER — HEPARIN SOD (PORK) LOCK FLUSH 100 UNIT/ML IV SOLN
500.0000 [IU] | Freq: Once | INTRAVENOUS | Status: AC | PRN
  Administered 2012-07-27: 500 [IU]
  Filled 2012-07-27: qty 5

## 2012-07-27 MED ORDER — DOCETAXEL CHEMO INJECTION 160 MG/16ML
40.0000 mg/m2 | Freq: Once | INTRAVENOUS | Status: AC
  Administered 2012-07-27: 80 mg via INTRAVENOUS
  Filled 2012-07-27: qty 8

## 2012-07-27 MED ORDER — METOCLOPRAMIDE HCL 10 MG PO TABS: 10.0000 mg | ORAL_TABLET | Freq: Four times a day (QID) | ORAL | Status: DC

## 2012-07-27 MED ORDER — TRASTUZUMAB CHEMO INJECTION 440 MG
6.0000 mg/kg | Freq: Once | INTRAVENOUS | Status: AC
  Administered 2012-07-27: 441 mg via INTRAVENOUS
  Filled 2012-07-27: qty 21

## 2012-07-27 MED ORDER — LORAZEPAM 0.5 MG PO TABS: 0.5000 mg | ORAL_TABLET | Freq: Two times a day (BID) | ORAL | Status: AC | PRN

## 2012-07-27 MED ORDER — SODIUM CHLORIDE 0.9 % IJ SOLN
10.0000 mL | INTRAMUSCULAR | Status: DC | PRN
  Administered 2012-07-27: 10 mL
  Filled 2012-07-27: qty 10

## 2012-07-27 MED ORDER — DIPHENHYDRAMINE HCL 25 MG PO CAPS
25.0000 mg | ORAL_CAPSULE | Freq: Once | ORAL | Status: AC
  Administered 2012-07-27: 25 mg via ORAL

## 2012-07-27 MED ORDER — DEXAMETHASONE SODIUM PHOSPHATE 10 MG/ML IJ SOLN
10.0000 mg | Freq: Once | INTRAMUSCULAR | Status: AC
  Administered 2012-07-27: 10 mg via INTRAVENOUS

## 2012-07-27 MED ORDER — ONDANSETRON 16 MG/50ML IVPB (CHCC)
16.0000 mg | Freq: Once | INTRAVENOUS | Status: AC
  Administered 2012-07-27: 16 mg via INTRAVENOUS

## 2012-07-27 NOTE — Patient Instructions (Addendum)
[facility] Discharge Instructions for Patients Receiving Chemotherapy  Today you received the following chemotherapy agents Taxotere, Herceptin and Perjeta.  To help prevent nausea and vomiting after your treatment, we encourage you to take your nausea medication. Begin taking your nausea medication as often as prescribed for by [name].    If you develop nausea and vomiting that is not controlled by your nausea medication, call the clinic. If it is after clinic hours your family physician or the after hours number for the clinic or go to the Emergency Department.   BELOW ARE SYMPTOMS THAT SHOULD BE REPORTED IMMEDIATELY:  *FEVER GREATER THAN 100.5 F  *CHILLS WITH OR WITHOUT FEVER  NAUSEA AND VOMITING THAT IS NOT CONTROLLED WITH YOUR NAUSEA MEDICATION  *UNUSUAL SHORTNESS OF BREATH  *UNUSUAL BRUISING OR BLEEDING  TENDERNESS IN MOUTH AND THROAT WITH OR WITHOUT PRESENCE OF ULCERS  *URINARY PROBLEMS  *BOWEL PROBLEMS  UNUSUAL RASH Items with * indicate a potential emergency and should be followed up as soon as possible.  One of the nurses will contact you 24 hours after your treatment. Please let the nurse know about any problems that you may have experienced. Feel free to call the clinic you have any questions or concerns. The clinic phone number is [phone].   I have been informed and understand all the instructions given to me. I know to contact the clinic, my physician, or go to the Emergency Department if any problems should occur. I do not have any questions at this time, but understand that I may call the clinic during office hours   should I have any questions or need assistance in obtaining follow up care.    __________________________________________  _____________  __________ Signature of Patient or Authorized Representative            Date                   Time    __________________________________________ Nurse's Signature

## 2012-07-27 NOTE — Progress Notes (Signed)
ID: [name]   DOB: 09/08/1950  MR#: [id number]  KGM#:[id number]  PCP: [name], MD GYN: SU: [name] OTHER MD:  HISTORY OF PRESENT ILLNESS: The patient and her husband, Gene, moved back to this area recently from [facility] (Gene actually is a native of [place] and incidentally remains a [employer] fan).  As part of moving boxes, Ms. Nugent felt something under her left arm.  She brought it to [name] attention and although the patient states that she has always had some cyclical swelling of her left axilla during menstruation, [name] set the patient up for evaluation at [facility].  On 12-12-05 the patient had a mammogram and ultrasound there and these showed no mass, distortion or microcalcifications in either breast however, in the left axilla there was a large macrolobulated mass and just above that there was a slightly prominent lymph node which did demonstrate a fatty hilum.  Physically on exam the mass was mobile and nontender and measured approximately 6.5 cm.  The ultrasound showed two small hypoechoic nodules in the left breast 3 cm. from the nipple measuring 7 mm. each and felt to be most likely benign however, the axillary mass, of course, was quite suspicious and biopsy was performed the same day.  This showed ([id number] and [id number]) invasive breast cancer which was strongly ER positive at 76%, moderately PR positive at 25% with a very high proliferation marker at 66%.  HER-2/neu was 1+ and FISH showed no amplification with a ratio of 1.1.    With this information, the patient was referred to Dr. [name] and on 12-20-05 the patient had bilateral breast MRI's.  In the left breast there was a 5.8 cm. axillary mass and several abnormally enlarged lymph nodes.  There was a 7 mm. left supraclavicular lymph node and the two nodules in the breast that had been noted by ultrasound were also noted by MRI measuring 1.1 and 0.7 cm.  In the right breast there were no  abnormalities noted.  MR guided biopsies of the left breast masses were obtained on 12-27-05 and both showed high grade ductal carcinoma.  There was evidence of lymphovascular invasion and high grade ductal carcinoma in situ as well ([id number]).    [name] discussed the situation with me and we felt it would be useful to have a PET scan prior to definitive surgery.  This was obtained on 12-30-05 and showed the primary axillary mass to have an SUV of 8.5.  The lymph nodes in the left axilla had SUV's in the 2.2 range, small focus of increased activity in the left breast had an SUV of 2.3.  There was also skin thickening with mildly increased diffuse FDG uptake within the skin suggesting an inflammatory breast cancer.  The neck, abdomen and  pelvis were negative.  In the lung windows, there were several scattered small areas of focal nodular density in the upper lobes with compressive atelectasis and this was felt to be benign.  Chest x-ray on 01-09-06 was negative.  With this information, [name] proceeded, after appropriate discussion, to left modified radical mastectomy on 01-15-06 and the insertion of a BARD port at the same time.  The final pathology ([id number]) showed the maximum tumor size to be 5 cm.  Margins were ample.  There was evidence of lymphovascular invasion.  This infiltrating ductal carcinoma was a grade 3 of 3 and 11 out of 14 lymph nodes were involved.  There was evidence of extracapsular extension.  Her subsequent history is as detailed below.   INTERVAL HISTORY: Lyrical returns today with her husband, Gene, for followup of her metastatic breast carcinoma. Today is day 1 cycle 3 of her docetaxel/trastuzumab/pertuzumab, with docetaxel being given at a 50% dose reduction.  Izzabella also receives Neulasta on day 2 for granulocyte support following cycle 2.   Diara tells me it has been a very rough month for the family. They recently had to put down her 60 year old horse.  Her husband's  aunt recently passed away. Overall, there has been a lot of stressors at home.   Physically, Rozell is feeling "fair", but continues to have quite a bit of fatigue. She's also had postprandial emesis on several occasions, and describes a feeling of early satiety when eating.  She denies actual nausea.   Fortunately, her diarrhea has resolved and she is having regular bowel movements.  REVIEW OF SYSTEMS: Urania denies any fevers or chills.  She denies any cough, phlegm production, pleurisy, or chest pain. She has shortness of breath with exertion.  She's had no abnormal headaches or dizziness. She has no body aches currently, and denies any unusual myalgias, arthralgias, bony pain. No peripheral swelling. Her neuropathy is stable and has not increased since starting this regimen a few weeks ago.  A detailed review of systems was otherwise stable.  PAST MEDICAL HISTORY: Past Medical History  Diagnosis Date  . Hypertension   . Depression   . breast ca dx'd 01/15/2006    chemo/xrt comp  . Breast cancer metastasized to multiple sites 07/13/2012  remote tonsillectomy, remote cholecystectomy, status post appendectomy, multiple skin biopsies for what the patient said were simple keratoses, and status post port placement.  FAMILY HISTORY: The patient has no information regarding her father. She was brought up by her stepfather. The patient's mother died at the age of 77 with bladder cancer. The patient has one sister who is in good health. There is no history of breast or ovarian cancer in the family to the patient's knowledge.   GYNECOLOGIC HISTORY: She is GX, P2. She was premenopausal at the time of her initial breast cancer diagnosis  SOCIAL HISTORY: She and her husband, Gene, have been married 10 years. Gene owns a company in [place] and Morton helps with the company. 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 She has two children, a daughter, [name], who lives in [place] and is a  Investment banker, corporate, and a son, [name], also in [place], who works for [employer]. She has two grandchildren and three step-grandchildren from her own two children, [name] and [name], who are from her first marriage. Gene has one daughter from his first marriage and he has grandchildren through her. The patient was brought up as a Catholic but currently is not practicing. She and Gene are attending a [employer].      ADVANCED DIRECTIVES:  HEALTH MAINTENANCE: History  Substance Use Topics  . Smoking status: Former Smoker -- 20 years    Quit date: 01/31/1995  . Smokeless tobacco: Never Used  . Alcohol Use: Yes     Comment: occasionl     Colonoscopy:  PAP:  Bone density: August 2012, "Normal"  Lipid panel:  No Known Allergies  Current Outpatient Prescriptions  Medication Sig Dispense Refill  . calcium citrate (CALCITRATE - DOSED IN MG ELEMENTAL CALCIUM) 950 MG tablet Take 1 tablet by mouth daily.        . cholecalciferol (VITAMIN D) 1000 UNITS tablet Take 3,000  Units by mouth daily.        . cholestyramine (QUESTRAN) 4 G packet Take 1 packet by mouth 2 (two) times daily with a meal. Mix with water.  60 each  1  . dexamethasone (DECADRON) 4 MG tablet Take 2 tablets (8 mg total) by mouth 2 (two) times daily with a meal.  60 tablet  1  . FLUoxetine (PROZAC) 40 MG capsule Take 1 capsule (40 mg total) by mouth daily.  30 capsule  3  . lidocaine-prilocaine (EMLA) cream Apply topically as needed.  30 g  3  . loperamide (IMODIUM) 2 MG capsule Take 2 mg by mouth 4 (four) times daily as needed. diarrhea      . LORazepam (ATIVAN) 0.5 MG tablet Take 1 tablet (0.5 mg total) by mouth 2 (two) times daily as needed.  60 tablet  0  . ondansetron (ZOFRAN) 8 MG tablet Take 1 tablet (8 mg total) by mouth 2 (two) times daily as needed for nausea.  20 tablet  0  . prochlorperazine (COMPAZINE) 10 MG tablet Take 1 tablet (10 mg total) by mouth every 6 (six) hours as needed.  30 tablet  3  .  tobramycin-dexamethasone (TOBRADEX) ophthalmic solution Place 1 drop into both eyes 2 (two) times daily.  5 mL  1  . losartan (COZAAR) 50 MG tablet Take 1 tablet (50 mg total) by mouth daily.  30 tablet  6  . metoCLOPramide (REGLAN) 10 MG tablet Take 1 tablet (10 mg total) by mouth 4 (four) times daily.  60 tablet  2  . oxyCODONE (OXY IR/ROXICODONE) 5 MG immediate release tablet       . traMADol (ULTRAM) 50 MG tablet        No current facility-administered medications for this visit.   Facility-Administered Medications Ordered in Other Visits  Medication Dose Route Frequency Provider Last Rate Last Dose  . cloNIDine (CATAPRES) tablet 0.2 mg  0.2 mg Oral Once Lauryn Lizardi G Kolina Kube, PA      . sodium chloride 0.9 % injection 10 mL  10 mL Intracatheter PRN Catalina Gravel, PA   10 mL at 07/27/12 1348     OBJECTIVE:   Filed Vitals:   07/27/12 0844  BP: 151/76  Pulse: 86  Temp: 98.1 F (36.7 C)  Resp: 20     Body mass index is 28.31 kg/(m^2).    ECOG FS: 1 Filed Weights   07/27/12 0844  Weight: 165 lb (74.844 kg)   Middle age white female who appears anxious but in no acute distress.  Sclerae unicteric Oropharynx clear Lungs are clear to auscultation, no rales or rhonchi Heart regular rate and rhythm Palpable adenopathy palpated in the left supraclavicular area/left anterior neck, but notes appear smaller than with previous exams, now measuring between 0.5 and 0.75cm by palpation.  Abdomen is soft, nontender to palpation, with positive bowel sounds.  Neuro: nonfocal, well oriented, anxious affect Musculoskeletal, no focal spinal tenderness to gentle palpation. No peripheral edema Breasts: Deferred  LAB RESULTS:  Lab Results  Component Value Date   WBC 7.6 07/27/2012   NEUTROABS 6.0 07/27/2012   HGB 9.5* 07/27/2012   HCT 29.7* 07/27/2012   MCV 86.1 07/27/2012   PLT 351 07/27/2012      Chemistry      Component Value Date/Time   NA 141 07/27/2012 0825   NA 136 02/01/2012 0500   NA 142  11/05/2010 1036   K 3.6 07/27/2012 0825   K 4.5 02/01/2012 0500  K 4.5 11/05/2010 1036   CL 101 07/27/2012 0825   CL 102 02/01/2012 0500   CL 99 11/05/2010 1036   CO2 28 07/27/2012 0825   CO2 25 02/01/2012 0500   CO2 26 11/05/2010 1036   BUN 13.5 07/27/2012 0825   BUN 13 02/01/2012 0500   BUN 14 11/05/2010 1036   CREATININE 0.9 07/27/2012 0825   CREATININE 0.97 02/01/2012 0500   CREATININE 0.8 11/05/2010 1036      Component Value Date/Time   CALCIUM 9.4 07/27/2012 0825   CALCIUM 8.6 02/01/2012 0500   CALCIUM 8.8 11/05/2010 1036   ALKPHOS 103 07/27/2012 0825   ALKPHOS 88 01/08/2012 0916   ALKPHOS 106* 11/05/2010 1036   AST 9 07/27/2012 0825   AST 24 01/08/2012 0916   AST 27 11/05/2010 1036   ALT 11 07/27/2012 0825   ALT 27 01/08/2012 0916   BILITOT 0.58 07/27/2012 0825   BILITOT 0.9 01/08/2012 0916   BILITOT 0.80 11/05/2010 1036       Lab Results  Component Value Date   LABCA2 85* 07/06/2012    STUDIES: Echocardiogram 05/21/2012 continues to show a well preserved ejection fraction of 55 - 60 %.    ASSESSMENT: 62 year old [place] woman with stage IV breast cancer.  1. 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 2. Status post 4 cycles of adjuvant docetaxel, carboplatin and trastuzumab completed November 2007. 3. Postmastectomy radiation completed February 2008. 4. Anastrozole begun February 2008 (the trastuzumab also was continued to complete a year), continued until June 2012.  5. 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  6. On tamoxifen/lapatinib/trastuzumab between June 2012 and October 2012.  7. Ixempra/lapatinib/trastuzumab started October 2012, the Ixempra discontinued in February 2013 due to peripheral neuropathy. 8. Continuing on lapatinib/trastuzumab with the addition of fulvestrant, first  injections given on 07/31/2011. Lapatinib given at a dose of 1 g (4 tablets) daily, and trastuzumab given at 8 mg/kg every 4 weeks to coordinate with injection appointments. 9. Discontinued lapatinib and fulvestrant in December 2013, and initiated q. three-week docetaxel/trastuzumab/pertuzumab, first dose given 06/15/2012.  Docetaxel given at a 50% dose reduction beginning with cycle 2 due to poor tolerance and severe neutropenia.  PLAN:  Sayuri will proceed to treatment today as scheduled for day 1 cycle 3 of docetaxel/trastuzumab/pertuzumab, with a docetaxel again given at a 50% dose reduction. She will return tomorrow for Neulasta on day 2. I will see her next week for assessment of chemotoxicity on February 17.  I have prescribed both lorazepam and metoclopramide for Eustolia to try taking 30 minutes prior to meals to see if this helps with her really satiety and her emesis. She understands that she can substitute the metoclopramide for some prochlorperazine as needed, but will not take both medications at the same time.  Emaleigh and Gene had many questions today about Takari;s prognosis.  Over half of our 45 minute appointment today was spent counseling the patient regarding her prognosis and treatment plan and also coordinating care. She voices understanding and agreement with this plan, and will call with any changes or problems.  Of note, when his next echocardiogram will be due in March, and I am requesting that be scheduled for her.   Of note, once we discontinue Quintavia's chemotherapy we will likely proceed with either Aromasin or letrozole.  Smitty Ackerley    07/27/2012

## 2012-07-27 NOTE — Progress Notes (Signed)
1350 Thirty minute post Perjeta observation completed, patient with no complaints. Discharged ambulating. AVS provided.

## 2012-07-28 ENCOUNTER — Ambulatory Visit (HOSPITAL_BASED_OUTPATIENT_CLINIC_OR_DEPARTMENT_OTHER): Payer: Medicare Other

## 2012-07-28 VITALS — BP 89/60 | HR 75 | Temp 97.7°F

## 2012-07-28 DIAGNOSIS — C50919 Malignant neoplasm of unspecified site of unspecified female breast: Secondary | ICD-10-CM

## 2012-07-28 MED ORDER — PEGFILGRASTIM INJECTION 6 MG/0.6ML
6.0000 mg | Freq: Once | SUBCUTANEOUS | Status: AC
  Administered 2012-07-28: 6 mg via SUBCUTANEOUS
  Filled 2012-07-28: qty 0.6

## 2012-08-03 ENCOUNTER — Ambulatory Visit: Payer: Medicare Other | Admitting: Physician Assistant

## 2012-08-03 ENCOUNTER — Other Ambulatory Visit: Payer: Medicare Other | Admitting: Lab

## 2012-08-05 ENCOUNTER — Ambulatory Visit: Payer: Medicare Other | Admitting: Lab

## 2012-08-05 ENCOUNTER — Other Ambulatory Visit: Payer: Self-pay | Admitting: *Deleted

## 2012-08-05 DIAGNOSIS — C50919 Malignant neoplasm of unspecified site of unspecified female breast: Secondary | ICD-10-CM

## 2012-08-05 LAB — CBC WITH DIFFERENTIAL/PLATELET
Basophils Absolute: 0.1 10*3/uL (ref 0.0–0.1)
Eosinophils Absolute: 0.1 10*3/uL (ref 0.0–0.5)
HCT: 30.9 % — ABNORMAL LOW (ref 34.8–46.6)
LYMPH%: 6.5 % — ABNORMAL LOW (ref 14.0–49.7)
MCV: 85.6 fL (ref 79.5–101.0)
MONO#: 1.2 10*3/uL — ABNORMAL HIGH (ref 0.1–0.9)
NEUT#: 31 10*3/uL — ABNORMAL HIGH (ref 1.5–6.5)
NEUT%: 89.5 % — ABNORMAL HIGH (ref 38.4–76.8)
Platelets: 271 10*3/uL (ref 145–400)
WBC: 34.7 10*3/uL — ABNORMAL HIGH (ref 3.9–10.3)

## 2012-08-05 LAB — COMPREHENSIVE METABOLIC PANEL (CC13)
BUN: 11.1 mg/dL (ref 7.0–26.0)
CO2: 24 mEq/L (ref 22–29)
Creatinine: 1.1 mg/dL (ref 0.6–1.1)
Glucose: 178 mg/dl — ABNORMAL HIGH (ref 70–99)
Total Bilirubin: 0.74 mg/dL (ref 0.20–1.20)
Total Protein: 6.2 g/dL — ABNORMAL LOW (ref 6.4–8.3)

## 2012-08-06 ENCOUNTER — Telehealth: Payer: Self-pay | Admitting: *Deleted

## 2012-08-06 MED ORDER — POTASSIUM CHLORIDE ER 10 MEQ PO CPCR: 20.0000 meq | ORAL_CAPSULE | Freq: Every day | ORAL | Status: DC

## 2012-08-06 NOTE — Telephone Encounter (Signed)
Called pt and discussed need for potassium replacement per lab results and recommendation per AB/PA.  Pt verbalized understanding and prescription for micro k per discussion ( easier to swallow ) escribed to pharmacy.

## 2012-08-17 ENCOUNTER — Encounter: Payer: Self-pay | Admitting: Oncology

## 2012-08-17 ENCOUNTER — Ambulatory Visit (HOSPITAL_BASED_OUTPATIENT_CLINIC_OR_DEPARTMENT_OTHER): Payer: Medicare Other

## 2012-08-17 ENCOUNTER — Other Ambulatory Visit (HOSPITAL_BASED_OUTPATIENT_CLINIC_OR_DEPARTMENT_OTHER): Payer: Medicare Other | Admitting: Lab

## 2012-08-17 ENCOUNTER — Telehealth: Payer: Self-pay | Admitting: Oncology

## 2012-08-17 ENCOUNTER — Ambulatory Visit (HOSPITAL_BASED_OUTPATIENT_CLINIC_OR_DEPARTMENT_OTHER): Payer: Medicare Other | Admitting: Physician Assistant

## 2012-08-17 ENCOUNTER — Encounter: Payer: Self-pay | Admitting: Physician Assistant

## 2012-08-17 VITALS — BP 108/65 | HR 100 | Temp 98.4°F | Resp 20 | Ht 64.0 in | Wt 161.3 lb

## 2012-08-17 DIAGNOSIS — C50919 Malignant neoplasm of unspecified site of unspecified female breast: Secondary | ICD-10-CM

## 2012-08-17 DIAGNOSIS — R5381 Other malaise: Secondary | ICD-10-CM

## 2012-08-17 DIAGNOSIS — C50219 Malignant neoplasm of upper-inner quadrant of unspecified female breast: Secondary | ICD-10-CM

## 2012-08-17 DIAGNOSIS — J3489 Other specified disorders of nose and nasal sinuses: Secondary | ICD-10-CM

## 2012-08-17 DIAGNOSIS — R0602 Shortness of breath: Secondary | ICD-10-CM

## 2012-08-17 LAB — CBC WITH DIFFERENTIAL/PLATELET
BASO%: 0.7 % (ref 0.0–2.0)
HCT: 26.5 % — ABNORMAL LOW (ref 34.8–46.6)
HGB: 8.5 g/dL — ABNORMAL LOW (ref 11.6–15.9)
MCHC: 32.1 g/dL (ref 31.5–36.0)
MONO#: 0.4 10*3/uL (ref 0.1–0.9)
NEUT#: 4.5 10*3/uL (ref 1.5–6.5)
NEUT%: 75.1 % (ref 38.4–76.8)
WBC: 6 10*3/uL (ref 3.9–10.3)
lymph#: 0.9 10*3/uL (ref 0.9–3.3)

## 2012-08-17 LAB — COMPREHENSIVE METABOLIC PANEL (CC13)
ALT: 10 U/L (ref 0–55)
AST: 12 U/L (ref 5–34)
Albumin: 2.5 g/dL — ABNORMAL LOW (ref 3.5–5.0)
BUN: 11.2 mg/dL (ref 7.0–26.0)
Calcium: 8.8 mg/dL (ref 8.4–10.4)
Chloride: 103 mEq/L (ref 98–107)
Potassium: 3.6 mEq/L (ref 3.5–5.1)

## 2012-08-17 LAB — CANCER ANTIGEN 27.29: CA 27.29: 107 U/mL — ABNORMAL HIGH (ref 0–39)

## 2012-08-17 MED ORDER — ONDANSETRON 16 MG/50ML IVPB (CHCC)
16.0000 mg | Freq: Once | INTRAVENOUS | Status: AC
  Administered 2012-08-17: 16 mg via INTRAVENOUS

## 2012-08-17 MED ORDER — SODIUM CHLORIDE 0.9 % IJ SOLN
10.0000 mL | INTRAMUSCULAR | Status: DC | PRN
  Administered 2012-08-17: 10 mL
  Filled 2012-08-17: qty 10

## 2012-08-17 MED ORDER — SODIUM CHLORIDE 0.9 % IV SOLN
Freq: Once | INTRAVENOUS | Status: AC
  Administered 2012-08-17: 11:00:00 via INTRAVENOUS

## 2012-08-17 MED ORDER — HEPARIN SOD (PORK) LOCK FLUSH 100 UNIT/ML IV SOLN
500.0000 [IU] | Freq: Once | INTRAVENOUS | Status: AC | PRN
  Administered 2012-08-17: 500 [IU]
  Filled 2012-08-17: qty 5

## 2012-08-17 MED ORDER — TRASTUZUMAB CHEMO INJECTION 440 MG
6.0000 mg/kg | Freq: Once | INTRAVENOUS | Status: AC
  Administered 2012-08-17: 441 mg via INTRAVENOUS
  Filled 2012-08-17: qty 21

## 2012-08-17 MED ORDER — SODIUM CHLORIDE 0.9 % IV SOLN
420.0000 mg | Freq: Once | INTRAVENOUS | Status: AC
  Administered 2012-08-17: 420 mg via INTRAVENOUS
  Filled 2012-08-17: qty 14

## 2012-08-17 MED ORDER — DIPHENHYDRAMINE HCL 25 MG PO CAPS
25.0000 mg | ORAL_CAPSULE | Freq: Once | ORAL | Status: AC
  Administered 2012-08-17: 25 mg via ORAL

## 2012-08-17 MED ORDER — ACETAMINOPHEN 325 MG PO TABS
650.0000 mg | ORAL_TABLET | Freq: Once | ORAL | Status: AC
  Administered 2012-08-17: 650 mg via ORAL

## 2012-08-17 MED ORDER — DEXAMETHASONE SODIUM PHOSPHATE 10 MG/ML IJ SOLN
10.0000 mg | Freq: Once | INTRAMUSCULAR | Status: AC
  Administered 2012-08-17: 10 mg via INTRAVENOUS

## 2012-08-17 MED ORDER — DOCETAXEL CHEMO INJECTION 160 MG/16ML
40.0000 mg/m2 | Freq: Once | INTRAVENOUS | Status: AC
  Administered 2012-08-17: 70 mg via INTRAVENOUS
  Filled 2012-08-17: qty 7

## 2012-08-17 NOTE — Patient Instructions (Addendum)
[facility] Discharge Instructions for Patients Receiving Chemotherapy  Today you received the following chemotherapy agents: taxotere, herceptin, perjeta  To help prevent nausea and vomiting after your treatment, we encourage you to take your nausea medication.  Take it as often as prescribed.     If you develop nausea and vomiting that is not controlled by your nausea medication, call the clinic. If it is after clinic hours your family physician or the after hours number for the clinic or go to the Emergency Department.   BELOW ARE SYMPTOMS THAT SHOULD BE REPORTED IMMEDIATELY:  *FEVER GREATER THAN 100.5 F  *CHILLS WITH OR WITHOUT FEVER  NAUSEA AND VOMITING THAT IS NOT CONTROLLED WITH YOUR NAUSEA MEDICATION  *UNUSUAL SHORTNESS OF BREATH  *UNUSUAL BRUISING OR BLEEDING  TENDERNESS IN MOUTH AND THROAT WITH OR WITHOUT PRESENCE OF ULCERS  *URINARY PROBLEMS  *BOWEL PROBLEMS  UNUSUAL RASH Items with * indicate a potential emergency and should be followed up as soon as possible.  Feel free to call the clinic you have any questions or concerns. The clinic phone number is [phone].   I have been informed and understand all the instructions given to me. I know to contact the clinic, my physician, or go to the Emergency Department if any problems should occur. I do not have any questions at this time, but understand that I may call the clinic during office hours   should I have any questions or need assistance in obtaining follow up care.    __________________________________________  _____________  __________ Signature of Patient or Authorized Representative            Date                   Time    __________________________________________ Nurse's Signature

## 2012-08-17 NOTE — Telephone Encounter (Signed)
gv pt appt schedule for March included appt for echo/Bensimhon 3/25.

## 2012-08-17 NOTE — Progress Notes (Signed)
ID: Rebecca Pugh   DOB: [birth year]  MR#: [id number]  CSN#:[id number]  PCP: [name], MD GYN: SU: [name] OTHER MD:  HISTORY OF PRESENT ILLNESS: The patient and Rebecca Pugh husband, Rebecca Pugh, moved back to this area recently from [facility] (Rebecca Pugh actually is a native of [place] and incidentally remains a [employer] fan).  As part of moving boxes, Rebecca Pugh felt something under Rebecca Pugh left arm.  She brought it to [name] attention and although the patient states that she has always had some cyclical swelling of Rebecca Pugh left axilla during menstruation, [name] set the patient up for evaluation at [facility].  On 12-12-05 the patient had a mammogram and ultrasound there and these showed no mass, distortion or microcalcifications in either breast however, in the left axilla there was a large macrolobulated mass and just above that there was a slightly prominent lymph node which did demonstrate a fatty hilum.  Physically on exam the mass was mobile and nontender and measured approximately 6.5 cm.  The ultrasound showed two small hypoechoic nodules in the left breast 3 cm. from the nipple measuring 7 mm. each and felt to be most likely benign however, the axillary mass, of course, was quite suspicious and biopsy was performed the same day.  This showed ([id number] and [id number]) invasive breast cancer which was strongly ER positive at 76%, moderately PR positive at 25% with a very high proliferation marker at 66%.  Rebecca Pugh-2/neu was 1+ and FISH showed no amplification with a ratio of 1.1.    With this information, the patient was referred to Dr. [name] and on 12-20-05 the patient had bilateral breast MRI's.  In the left breast there was a 5.8 cm. axillary mass and several abnormally enlarged lymph nodes.  There was a 7 mm. left supraclavicular lymph node and the two nodules in the breast that had been noted by ultrasound were also noted by MRI measuring 1.1 and 0.7 cm.  In the right breast there were no  abnormalities noted.  MR guided biopsies of the left breast masses were obtained on 12-27-05 and both showed high grade ductal carcinoma.  There was evidence of lymphovascular invasion and high grade ductal carcinoma in situ as well ([id number]).    [name] discussed the situation with me and we felt it would be useful to have a PET scan prior to definitive surgery.  This was obtained on 12-30-05 and showed the primary axillary mass to have an SUV of 8.5.  The lymph nodes in the left axilla had SUV's in the 2.2 range, small focus of increased activity in the left breast had an SUV of 2.3.  There was also skin thickening with mildly increased diffuse FDG uptake within the skin suggesting an inflammatory breast cancer.  The neck, abdomen and  pelvis were negative.  In the lung windows, there were several scattered small areas of focal nodular density in the upper lobes with compressive atelectasis and this was felt to be benign.  Chest x-ray on 01-09-06 was negative.  With this information, [name] proceeded, after appropriate discussion, to left modified radical mastectomy on 01-15-06 and the insertion of a BARD port at the same time.  The final pathology ([id number]) showed the maximum tumor size to be 5 cm.  Margins were ample.  There was evidence of lymphovascular invasion.  This infiltrating ductal carcinoma was a grade 3 of 3 and 11 out of 14 lymph nodes were involved.  There was evidence of extracapsular extension.  Rebecca Pugh subsequent history is as detailed below.   INTERVAL HISTORY: Burlene returns today with Rebecca Pugh husband, Rebecca Pugh, for followup of Rebecca Pugh metastatic breast carcinoma. Today is day 1 cycle 4 of Rebecca Pugh docetaxel/trastuzumab/pertuzumab, with docetaxel being given at a 50% dose reduction.  Rebecca Pugh also receives Neulasta on day 2 for granulocyte support following cycle 2.   Rebecca Pugh had a good week this past week, but the first 2 weeks following cycle 3 and were more difficult. She is increasingly fatigued.  She has shortness of breath with exertion. She's having what she considers spontaneous episodes of emesis. These occur randomly, with no associated nausea. She attributes the emesis to postnasal drip, leading to a cough, which makes Rebecca Pugh gag and eventually vomit. She denies any significant phlegm production with a cough. She has had no hemoptysis. Rebecca Pugh sinuses congested. She denies any fevers or chills.    REVIEW OF SYSTEMS: Anyela denies any pleurisy or chest pain. She has shortness of breath with exertion.  She's eating fairly well, and Rebecca Pugh appetite improved this past week. She's having regular bowel movements with neither diarrhea nor constipation. She denies any signs of abnormal bleeding. She's had no abnormal headaches or dizziness. She has no body aches currently, and denies any unusual myalgias, arthralgias, bony pain. No peripheral swelling. Rebecca Pugh neuropathy is stable and has not increased since starting this regimen.  A detailed review of systems was otherwise stable.  PAST MEDICAL HISTORY: Past Medical History  Diagnosis Date  . Hypertension   . Depression   . breast ca dx'd 01/15/2006    chemo/xrt comp  . Breast cancer metastasized to multiple sites 07/13/2012  remote tonsillectomy, remote cholecystectomy, status post appendectomy, multiple skin biopsies for what the patient said were simple keratoses, and status post port placement.  FAMILY HISTORY: The patient has no information regarding Rebecca Pugh father. She was brought up by Rebecca Pugh stepfather. The patient's mother died at the age of 70 with bladder cancer. The patient has one sister who is in good health. There is no history of breast or ovarian cancer in the family to the patient's knowledge.   GYNECOLOGIC HISTORY: She is GX, P2. She was premenopausal at the time of Rebecca Pugh initial breast cancer diagnosis  SOCIAL HISTORY: She and Rebecca Pugh husband, Rebecca Pugh, have been married 10 years. 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 She has two children, a daughter, Rebecca Mcgee, who lives in [place] and is a Investment banker, corporate, and a son, [name], also in [place], who works for [employer]. She has two grandchildren and three step-grandchildren from Rebecca Pugh own two children, [name] and [name], who are from Rebecca Pugh first marriage. Rebecca Pugh has one daughter from his first marriage and he has grandchildren through Rebecca Pugh. The patient was brought up as a Catholic but currently is not practicing. She and Rebecca Pugh are attending a [employer].      ADVANCED DIRECTIVES:  HEALTH MAINTENANCE: History  Substance Use Topics  . Smoking status: Former Smoker -- 20 years    Quit date: 01/31/1995  . Smokeless tobacco: Never Used  . Alcohol Use: Yes     Comment: occasionl     Colonoscopy:  PAP:  Bone density: August 2012, "Normal"  Lipid panel:  No Known Allergies  Current Outpatient Prescriptions  Medication Sig Dispense Refill  . calcium citrate (CALCITRATE - DOSED IN MG ELEMENTAL CALCIUM) 950 MG tablet Take 1 tablet by mouth daily.        Marland Kitchen  cholecalciferol (VITAMIN D) 1000 UNITS tablet Take 3,000 Units by mouth daily.        . cholestyramine (QUESTRAN) 4 G packet Take 1 packet by mouth 2 (two) times daily with a meal. Mix with water.  60 each  1  . dexamethasone (DECADRON) 4 MG tablet Take 2 tablets (8 mg total) by mouth 2 (two) times daily with a meal.  60 tablet  1  . FLUoxetine (PROZAC) 40 MG capsule Take 1 capsule (40 mg total) by mouth daily.  30 capsule  3  . lidocaine-prilocaine (EMLA) cream Apply topically as needed.  30 g  3  . loperamide (IMODIUM) 2 MG capsule Take 2 mg by mouth 4 (four) times daily as needed. diarrhea      . LORazepam (ATIVAN) 0.5 MG tablet Take 1 tablet (0.5 mg total) by mouth 2 (two) times daily as needed.  60 tablet  0  . losartan (COZAAR) 50 MG tablet Take 1 tablet (50 mg total) by mouth daily.  30 tablet  6  . metoCLOPramide (REGLAN) 10 MG tablet Take 1  tablet (10 mg total) by mouth 4 (four) times daily.  60 tablet  2  . ondansetron (ZOFRAN) 8 MG tablet Take 1 tablet (8 mg total) by mouth 2 (two) times daily as needed for nausea.  20 tablet  0  . oxyCODONE (OXY IR/ROXICODONE) 5 MG immediate release tablet       . potassium chloride (MICRO-K) 10 MEQ CR capsule Take 2 capsules (20 mEq total) by mouth daily.  60 capsule  1  . prochlorperazine (COMPAZINE) 10 MG tablet Take 1 tablet (10 mg total) by mouth every 6 (six) hours as needed.  30 tablet  3  . tobramycin-dexamethasone (TOBRADEX) ophthalmic solution Place 1 drop into both eyes 2 (two) times daily.  5 mL  1  . traMADol (ULTRAM) 50 MG tablet        No current facility-administered medications for this visit.   Facility-Administered Medications Ordered in Other Visits  Medication Dose Route Frequency Provider Last Rate Last Dose  . cloNIDine (CATAPRES) tablet 0.2 mg  0.2 mg Oral Once Catalina Gravel, PA         OBJECTIVE:   Filed Vitals:   08/17/12 0938  BP: 108/65  Pulse: 100  Temp: 98.4 F (36.9 C)  Resp: 20     Body mass index is 27.67 kg/(Pugh^2).    ECOG FS: 1 Filed Weights   08/17/12 0938  Weight: 161 lb 4.8 oz (73.165 kg)   Middle age white female who appears anxious and tired but in no acute distress.  Sclerae unicteric Oropharynx clear Lungs are clear to auscultation, no rales or rhonchi Heart regular rate and rhythm Palpable adenopathy palpated in the left supraclavicular area/left anterior neck, but appear smaller and more difficult to palpate than with previous exams.  Abdomen is soft, nontender to palpation, with positive bowel sounds.  Neuro: nonfocal, well oriented, anxious affect Musculoskeletal, no focal spinal tenderness to gentle palpation. No peripheral edema Breasts: Deferred  LAB RESULTS:  Lab Results  Component Value Date   WBC 6.0 08/17/2012   NEUTROABS 4.5 08/17/2012   HGB 8.5* 08/17/2012   HCT 26.5* 08/17/2012   MCV 86.3 08/17/2012   PLT 300 08/17/2012       Chemistry      Component Value Date/Time   NA 138 08/05/2012 1242   NA 136 02/01/2012 0500   NA 142 11/05/2010 1036   K 3.2* 08/05/2012 1242  K 4.5 02/01/2012 0500   K 4.5 11/05/2010 1036   CL 101 08/05/2012 1242   CL 102 02/01/2012 0500   CL 99 11/05/2010 1036   CO2 24 08/05/2012 1242   CO2 25 02/01/2012 0500   CO2 26 11/05/2010 1036   BUN 11.1 08/05/2012 1242   BUN 13 02/01/2012 0500   BUN 14 11/05/2010 1036   CREATININE 1.1 08/05/2012 1242   CREATININE 0.97 02/01/2012 0500   CREATININE 0.8 11/05/2010 1036      Component Value Date/Time   CALCIUM 8.7 08/05/2012 1242   CALCIUM 8.6 02/01/2012 0500   CALCIUM 8.8 11/05/2010 1036   ALKPHOS 190* 08/05/2012 1242   ALKPHOS 88 01/08/2012 0916   ALKPHOS 106* 11/05/2010 1036   AST 10 08/05/2012 1242   AST 24 01/08/2012 0916   AST 27 11/05/2010 1036   ALT 13 08/05/2012 1242   ALT 27 01/08/2012 0916   BILITOT 0.74 08/05/2012 1242   BILITOT 0.9 01/08/2012 0916   BILITOT 0.80 11/05/2010 1036       Lab Results  Component Value Date   LABCA2 100* 07/27/2012    STUDIES: Echocardiogram 05/21/2012 continues to show a well preserved ejection fraction of 55 - 60 %.    ASSESSMENT: 62 year old [place] woman with stage IV breast cancer.  1. 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 2. Status post 4 cycles of adjuvant docetaxel, carboplatin and trastuzumab completed November 2007. 3. Postmastectomy radiation completed February 2008. 4. Anastrozole begun February 2008 (the trastuzumab also was continued to complete a year), continued until June 2012.  5. 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  6. On tamoxifen/lapatinib/trastuzumab between June 2012 and October 2012.  7. Ixempra/lapatinib/trastuzumab started October 2012, the Ixempra discontinued in February 2013 due to  peripheral neuropathy. 8. Continuing on lapatinib/trastuzumab with the addition of fulvestrant, first injections given on 07/31/2011. Lapatinib given at a dose of 1 g (4 tablets) daily, and trastuzumab given at 8 mg/kg every 4 weeks to coordinate with injection appointments. 9. Discontinued lapatinib and fulvestrant in December 2013, and initiated q. three-week docetaxel/trastuzumab/pertuzumab, first dose given 06/15/2012.  Docetaxel given at a 50% dose reduction beginning with cycle 2 due to poor tolerance and severe neutropenia.  PLAN:  Haide will proceed to treatment today as scheduled for day 1 cycle 4 of docetaxel/trastuzumab/pertuzumab, with docetaxel again given at a 50% dose reduction. She will return tomorrow for Neulasta on day 2. I will see Rebecca Pugh next week for assessment of chemotoxicity on March 10.  I have recommended that [place]che try Claritin D for the sinus congestion and postnasal drip which she believes is contributing to Rebecca Pugh emesis.  She will call us, however, if this worsens. She will also contact us if Rebecca Pugh fatigue or shortness of breath worsens. Otherwise, we will recheck Rebecca Pugh labs and consider a blood transfusion when I see Rebecca Pugh next week on March 10.  Of note, Kaydynce's next echocardiogram is due in March, and I am requesting that be scheduled for Rebecca Pugh.   Of note, once we discontinue Haidyn's chemotherapy we will likely proceed with either Aromasin or letrozole.  Kirsi Hugh    08/17/2012

## 2012-08-18 ENCOUNTER — Ambulatory Visit (HOSPITAL_BASED_OUTPATIENT_CLINIC_OR_DEPARTMENT_OTHER): Payer: Medicare Other

## 2012-08-18 VITALS — BP 129/83 | HR 81 | Temp 97.7°F

## 2012-08-18 MED ORDER — PEGFILGRASTIM INJECTION 6 MG/0.6ML
6.0000 mg | Freq: Once | SUBCUTANEOUS | Status: AC
  Administered 2012-08-18: 6 mg via SUBCUTANEOUS
  Filled 2012-08-18: qty 0.6

## 2012-08-21 ENCOUNTER — Other Ambulatory Visit: Payer: Self-pay | Admitting: *Deleted

## 2012-08-21 NOTE — Progress Notes (Signed)
Pt called to this RN to state per visit with AB/PA last Monday- discussed probable need to obtain blood transfusion. Rebecca Pugh stated per call " I think I should proceed and so I am calling to get this scheduled maybe when I see [name] on Monday ".  This RN placed request for blood transfusion per above with pt understanding that appt may need to be made on Tuesday.

## 2012-08-24 ENCOUNTER — Telehealth: Payer: Self-pay | Admitting: Oncology

## 2012-08-24 ENCOUNTER — Other Ambulatory Visit (HOSPITAL_BASED_OUTPATIENT_CLINIC_OR_DEPARTMENT_OTHER): Payer: Medicare Other | Admitting: Lab

## 2012-08-24 ENCOUNTER — Ambulatory Visit (HOSPITAL_BASED_OUTPATIENT_CLINIC_OR_DEPARTMENT_OTHER): Payer: Medicare Other | Admitting: Physician Assistant

## 2012-08-24 ENCOUNTER — Encounter: Payer: Self-pay | Admitting: Physician Assistant

## 2012-08-24 VITALS — BP 126/79 | HR 105 | Temp 98.3°F | Resp 20 | Ht 64.0 in | Wt 157.5 lb

## 2012-08-24 DIAGNOSIS — C50919 Malignant neoplasm of unspecified site of unspecified female breast: Secondary | ICD-10-CM

## 2012-08-24 DIAGNOSIS — F419 Anxiety disorder, unspecified: Secondary | ICD-10-CM

## 2012-08-24 DIAGNOSIS — Z170 Estrogen receptor positive status [ER+]: Secondary | ICD-10-CM

## 2012-08-24 DIAGNOSIS — C50219 Malignant neoplasm of upper-inner quadrant of unspecified female breast: Secondary | ICD-10-CM

## 2012-08-24 DIAGNOSIS — C7970 Secondary malignant neoplasm of unspecified adrenal gland: Secondary | ICD-10-CM

## 2012-08-24 DIAGNOSIS — C773 Secondary and unspecified malignant neoplasm of axilla and upper limb lymph nodes: Secondary | ICD-10-CM

## 2012-08-24 LAB — CBC WITH DIFFERENTIAL/PLATELET
Eosinophils Absolute: 0.2 10*3/uL (ref 0.0–0.5)
HGB: 9.2 g/dL — ABNORMAL LOW (ref 11.6–15.9)
MCV: 84.9 fL (ref 79.5–101.0)
MONO#: 1.8 10*3/uL — ABNORMAL HIGH (ref 0.1–0.9)
MONO%: 5.6 % (ref 0.0–14.0)
NEUT#: 28.6 10*3/uL — ABNORMAL HIGH (ref 1.5–6.5)
RBC: 3.38 10*6/uL — ABNORMAL LOW (ref 3.70–5.45)
RDW: 16.3 % — ABNORMAL HIGH (ref 11.2–14.5)
WBC: 32.9 10*3/uL — ABNORMAL HIGH (ref 3.9–10.3)
lymph#: 2.3 10*3/uL (ref 0.9–3.3)

## 2012-08-24 LAB — HOLD TUBE, BLOOD BANK

## 2012-08-24 MED ORDER — PREDNISONE 5 MG PO TABS: 5.0000 mg | ORAL_TABLET | Freq: Every day | ORAL | Status: DC

## 2012-08-24 NOTE — Progress Notes (Signed)
ID: Rebecca Pugh   DOB: 1950-11-25  MR#: [id number]  CSN#:[id number]  PCP: [name], MD GYN: SU: [name] OTHER MD:  HISTORY OF PRESENT ILLNESS: The patient and her husband, Gene, moved back to this area recently from [facility] (Gene actually is a native of [place] and incidentally remains a [employer] fan).  As part of moving boxes, Ms. Heidt felt something under her left arm.  She brought it to [name] attention and although the patient states that she has always had some cyclical swelling of her left axilla during menstruation, [name] set the patient up for evaluation at [facility].  On 12-12-05 the patient had a mammogram and ultrasound there and these showed no mass, distortion or microcalcifications in either breast however, in the left axilla there was a large macrolobulated mass and just above that there was a slightly prominent lymph node which did demonstrate a fatty hilum.  Physically on exam the mass was mobile and nontender and measured approximately 6.5 cm.  The ultrasound showed two small hypoechoic nodules in the left breast 3 cm. from the nipple measuring 7 mm. each and felt to be most likely benign however, the axillary mass, of course, was quite suspicious and biopsy was performed the same day.  This showed ([id number] and [id number]) invasive breast cancer which was strongly ER positive at 76%, moderately PR positive at 25% with a very high proliferation marker at 66%.  HER-2/neu was 1+ and FISH showed no amplification with a ratio of 1.1.    With this information, the patient was referred to Dr. [name] and on 12-20-05 the patient had bilateral breast MRI's.  In the left breast there was a 5.8 cm. axillary mass and several abnormally enlarged lymph nodes.  There was a 7 mm. left supraclavicular lymph node and the two nodules in the breast that had been noted by ultrasound were also noted by MRI measuring 1.1 and 0.7 cm.  In the right breast there were no  abnormalities noted.  MR guided biopsies of the left breast masses were obtained on 12-27-05 and both showed high grade ductal carcinoma.  There was evidence of lymphovascular invasion and high grade ductal carcinoma in situ as well ([id number]).    [name] discussed the situation with me and we felt it would be useful to have a PET scan prior to definitive surgery.  This was obtained on 12-30-05 and showed the primary axillary mass to have an SUV of 8.5.  The lymph nodes in the left axilla had SUV's in the 2.2 range, small focus of increased activity in the left breast had an SUV of 2.3.  There was also skin thickening with mildly increased diffuse FDG uptake within the skin suggesting an inflammatory breast cancer.  The neck, abdomen and  pelvis were negative.  In the lung windows, there were several scattered small areas of focal nodular density in the upper lobes with compressive atelectasis and this was felt to be benign.  Chest x-ray on 01-09-06 was negative.  With this information, [name] proceeded, after appropriate discussion, to left modified radical mastectomy on 01-15-06 and the insertion of a BARD port at the same time.  The final pathology ([id number]) showed the maximum tumor size to be 5 cm.  Margins were ample.  There was evidence of lymphovascular invasion.  This infiltrating ductal carcinoma was a grade 3 of 3 and 11 out of 14 lymph nodes were involved.  There was evidence of extracapsular extension.  Her subsequent history is as detailed below.   INTERVAL HISTORY: Rebecca Pugh returns today with her husband, Gene, for followup of her metastatic breast carcinoma. Today is day 8 cycle 4 of her docetaxel/trastuzumab/pertuzumab, with docetaxel being given at a 50% dose reduction.  Rebecca Pugh also receives Neulasta on day 2 for granulocyte support following cycle 2.   Rebecca Pugh continues to feel very tired. She experiences a "crash" over the weekend following treatment, around the same time she  discontinues the dexamethasone. She has some shortness of breath with exertion, although this has not worsened. Fortunately, the nausea and emesis has improved significantly over the past week. Not only has she change her antinausea medication, but has also been on Claritin-D which has helped significantly with postnasal drip.   REVIEW OF SYSTEMS: Rebecca Pugh denies fevers, chills, or night sweats. She denies any pleurisy or chest pain.  Her appetite is decreased, and she continues to lose weight. She's having regular bowel movements with neither diarrhea nor constipation. She denies any signs of abnormal bleeding. She's had no abnormal headaches or dizziness. She has some occasional back pain, but otherwise no body aches currently, and denies any unusual myalgias, arthralgias, bony pain. No peripheral swelling. Her neuropathy is stable and has not increased since starting this regimen.  A detailed review of systems was otherwise stable.  PAST MEDICAL HISTORY: Past Medical History  Diagnosis Date  . Hypertension   . Depression   . breast ca dx'd 01/15/2006    chemo/xrt comp  . Breast cancer metastasized to multiple sites 07/13/2012  remote tonsillectomy, remote cholecystectomy, status post appendectomy, multiple skin biopsies for what the patient said were simple keratoses, and status post port placement.  FAMILY HISTORY: The patient has no information regarding her father. She was brought up by her stepfather. The patient's mother died at the age of 72 with bladder cancer. The patient has one sister who is in good health. There is no history of breast or ovarian cancer in the family to the patient's knowledge.   GYNECOLOGIC HISTORY: She is GX, P2. She was premenopausal at the time of her initial breast cancer diagnosis  SOCIAL HISTORY: She and her husband, Gene, have been married 10 years. 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 She has two children, a daughter, Rebecca Pugh, who lives in [place] and is a Investment banker, corporate, and a son, Rebecca Pugh, also in [place], who works for [employer]. She has two grandchildren and three step-grandchildren from her own two children, Rebecca Pugh and Rebecca Pugh, who are from her first marriage. Gene has one daughter from his first marriage and he has grandchildren through her. The patient was brought up as a Catholic but currently is not practicing. She and Gene are attending a [employer].      ADVANCED DIRECTIVES:  HEALTH MAINTENANCE: History  Substance Use Topics  . Smoking status: Former Smoker -- 20 years    Quit date: 01/31/1995  . Smokeless tobacco: Never Used  . Alcohol Use: Yes     Comment: occasionl     Colonoscopy:  PAP:  Bone density: August 2012, "Normal"  Lipid panel:  No Known Allergies  Current Outpatient Prescriptions  Medication Sig Dispense Refill  . calcium citrate (CALCITRATE - DOSED IN MG ELEMENTAL CALCIUM) 950 MG tablet Take 1 tablet by mouth daily.        . cholecalciferol (VITAMIN D) 1000 UNITS tablet Take 3,000 Units by  mouth daily.        . Desloratadine-Pseudoephedrine (CLARINEX-D 24 HOUR PO) Take 1 capsule by mouth daily.      Marland Kitchen dexamethasone (DECADRON) 4 MG tablet Take 2 tablets (8 mg total) by mouth 2 (two) times daily with a meal.  60 tablet  1  . FLUoxetine (PROZAC) 40 MG capsule Take 1 capsule (40 mg total) by mouth daily.  30 capsule  3  . lidocaine-prilocaine (EMLA) cream Apply topically as needed.  30 g  3  . loperamide (IMODIUM) 2 MG capsule Take 2 mg by mouth 4 (four) times daily as needed. diarrhea      . LORazepam (ATIVAN) 0.5 MG tablet Take 1 tablet (0.5 mg total) by mouth 2 (two) times daily as needed.  60 tablet  0  . metoCLOPramide (REGLAN) 10 MG tablet Take 1 tablet (10 mg total) by mouth 4 (four) times daily.  60 tablet  2  . ondansetron (ZOFRAN) 8 MG tablet Take 1 tablet (8 mg total) by mouth 2 (two) times daily as  needed for nausea.  20 tablet  0  . potassium chloride (MICRO-K) 10 MEQ CR capsule Take 2 capsules (20 mEq total) by mouth daily.  60 capsule  1  . prochlorperazine (COMPAZINE) 10 MG tablet Take 1 tablet (10 mg total) by mouth every 6 (six) hours as needed.  30 tablet  3  . tobramycin-dexamethasone (TOBRADEX) ophthalmic solution Place 1 drop into both eyes 2 (two) times daily.  5 mL  1  . cholestyramine (QUESTRAN) 4 G packet Take 1 packet by mouth 2 (two) times daily with a meal. Mix with water.  60 each  1  . losartan (COZAAR) 50 MG tablet Take 1 tablet (50 mg total) by mouth daily.  30 tablet  6  . oxyCODONE (OXY IR/ROXICODONE) 5 MG immediate release tablet       . predniSONE (DELTASONE) 5 MG tablet Take 1 tablet (5 mg total) by mouth daily. Take x 4 days after discontinuing dexamethasone.  30 tablet  0   No current facility-administered medications for this visit.   Facility-Administered Medications Ordered in Other Visits  Medication Dose Route Frequency Provider Last Rate Last Dose  . cloNIDine (CATAPRES) tablet 0.2 mg  0.2 mg Oral Once Rebecca Malveaux Allegra Grana, PA-C         OBJECTIVE:   Filed Vitals:   08/24/12 1045  BP: 126/79  Pulse: 105  Temp: 98.3 F (36.8 C)  Resp: 20     Body mass index is 27.02 kg/(m^2).    ECOG FS: 1 Filed Weights   08/24/12 1045  Weight: 157 lb 8 oz (71.442 kg)   Middle age white female who appears anxious and tired but in no acute distress.  Sclerae unicteric Oropharynx clear Lungs are clear to auscultation, no rales or rhonchi Heart regular rate and rhythm Difficult to palpate a distinct adenopathy in the left supraclavicular region today, although there is some thickening to palpation. Otherwise no adenopathy is palpated. Abdomen is soft, nontender to palpation, with positive bowel sounds.  Neuro: nonfocal, well oriented, anxious affect Musculoskeletal, no focal spinal tenderness to gentle palpation. No peripheral edema Breasts: Deferred  LAB RESULTS:   Lab Results  Component Value Date   WBC 32.9* 08/24/2012   NEUTROABS 28.6* 08/24/2012   HGB 9.2* 08/24/2012   HCT 28.7* 08/24/2012   MCV 84.9 08/24/2012   PLT 392 08/24/2012      Chemistry      Component Value Date/Time  NA 139 08/17/2012 0929   NA 136 02/01/2012 0500   NA 142 11/05/2010 1036   K 3.6 08/17/2012 0929   K 4.5 02/01/2012 0500   K 4.5 11/05/2010 1036   CL 103 08/17/2012 0929   CL 102 02/01/2012 0500   CL 99 11/05/2010 1036   CO2 26 08/17/2012 0929   CO2 25 02/01/2012 0500   CO2 26 11/05/2010 1036   BUN 11.2 08/17/2012 0929   BUN 13 02/01/2012 0500   BUN 14 11/05/2010 1036   CREATININE 1.3* 08/17/2012 0929   CREATININE 0.97 02/01/2012 0500   CREATININE 0.8 11/05/2010 1036      Component Value Date/Time   CALCIUM 8.8 08/17/2012 0929   CALCIUM 8.6 02/01/2012 0500   CALCIUM 8.8 11/05/2010 1036   ALKPHOS 96 08/17/2012 0929   ALKPHOS 88 01/08/2012 0916   ALKPHOS 106* 11/05/2010 1036   AST 12 08/17/2012 0929   AST 24 01/08/2012 0916   AST 27 11/05/2010 1036   ALT 10 08/17/2012 0929   ALT 27 01/08/2012 0916   BILITOT 0.82 08/17/2012 0929   BILITOT 0.9 01/08/2012 0916   BILITOT 0.80 11/05/2010 1036       Lab Results  Component Value Date   LABCA2 107* 08/17/2012    STUDIES: Echocardiogram 05/21/2012 continues to show a well preserved ejection fraction of 55 - 60 %.  Echo is scheduled to be repeated on 09/08/2012.    ASSESSMENT: 62 year old [place] woman with stage IV breast cancer.  1. 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 2. Status post 4 cycles of adjuvant docetaxel, carboplatin and trastuzumab completed November 2007. 3. Postmastectomy radiation completed February 2008. 4. Anastrozole begun February 2008 (the trastuzumab also was continued to complete a year), continued until June 2012.  5. 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  6. On tamoxifen/lapatinib/trastuzumab between June 2012 and October 2012.  7. Ixempra/lapatinib/trastuzumab started October 2012, the Ixempra discontinued in February 2013 due to peripheral neuropathy. 8. Continuing on lapatinib/trastuzumab with the addition of fulvestrant, first injections given on 07/31/2011. Lapatinib given at a dose of 1 g (4 tablets) daily, and trastuzumab given at 8 mg/kg every 4 weeks to coordinate with injection appointments. 9. Discontinued lapatinib and fulvestrant in December 2013, and initiated q. three-week docetaxel/trastuzumab/pertuzumab, first dose given 06/15/2012.  Docetaxel given at a 50% dose reduction beginning with cycle 2 due to poor tolerance and severe neutropenia.  PLAN:  This case was reviewed with Dr. Darnelle Pugh. Keiana has now completed 4 cycles of docetaxel/trastuzumab/pertuzumab,, and we will now repeat CTs of the neck and chest to assess response. She scheduled to see Dr. Darnelle Pugh when she returns in 2 weeks at which time they will review those results and discuss her treatment plan. I will note that she is also scheduled for repeat echocardiogram on March 25.  If in fact, we continue with her current treatment plan, I have prescribed prednisone, 5 mg in the mornings on days 5 through 8, once Rebecca Pugh has discontinued the dexamethasone following chemotherapy. Hopefully this will help with the "crash" she experiences on the weekend following chemotherapy.  Fortunately, Rebecca Pugh's hemoglobin has shown improvement over the last week, so we will forego a blood transfusion at this time. She will contact us, however, if her fatigue worsens and we certainly can recheck her CBC at that time.   Rebecca Pugh and  Gene both voice understanding and agreement with this plan, and will call with any changes or problems.   Rebecca Pugh    08/24/2012

## 2012-08-24 NOTE — Telephone Encounter (Signed)
gv pt appt schedule for March and April.  °

## 2012-08-31 ENCOUNTER — Encounter (HOSPITAL_COMMUNITY): Payer: Self-pay

## 2012-08-31 ENCOUNTER — Ambulatory Visit (HOSPITAL_COMMUNITY)
Admission: RE | Admit: 2012-08-31 | Discharge: 2012-08-31 | Disposition: A | Discharge status: Home / Self Care | Payer: Medicare Other | Source: Ambulatory Visit | Attending: Physician Assistant | Admitting: Physician Assistant

## 2012-08-31 DIAGNOSIS — R599 Enlarged lymph nodes, unspecified: Secondary | ICD-10-CM | POA: Insufficient documentation

## 2012-08-31 DIAGNOSIS — C50919 Malignant neoplasm of unspecified site of unspecified female breast: Secondary | ICD-10-CM | POA: Insufficient documentation

## 2012-08-31 DIAGNOSIS — C772 Secondary and unspecified malignant neoplasm of intra-abdominal lymph nodes: Secondary | ICD-10-CM | POA: Insufficient documentation

## 2012-08-31 DIAGNOSIS — N1330 Unspecified hydronephrosis: Secondary | ICD-10-CM | POA: Insufficient documentation

## 2012-08-31 MED ORDER — IOHEXOL 300 MG/ML  SOLN
100.0000 mL | Freq: Once | INTRAMUSCULAR | Status: AC | PRN
  Administered 2012-08-31: 100 mL via INTRAVENOUS

## 2012-09-01 ENCOUNTER — Other Ambulatory Visit: Payer: Self-pay | Admitting: Oncology

## 2012-09-01 ENCOUNTER — Telehealth: Payer: Self-pay | Admitting: *Deleted

## 2012-09-01 NOTE — Progress Notes (Signed)
ID: Rebecca Pugh   DOB: [birth year]  MR#: [id number]  CSN#:[id number]  PCP: [name], MD GYN: SU: [name] OTHER MD:  HISTORY OF PRESENT ILLNESS: The patient and her husband, Rebecca Pugh, moved back to this area recently from [facility] (Rebecca Pugh actually is a native of [place] and incidentally remains a [employer] fan).  As part of moving boxes, Rebecca Pugh felt something under her left arm.  She brought it to [name] attention and although the patient states that she has always had some cyclical swelling of her left axilla during menstruation, [name] set the patient up for evaluation at [facility].  On 12-12-05 the patient had a mammogram and ultrasound there and these showed no mass, distortion or microcalcifications in either breast however, in the left axilla there was a large macrolobulated mass and just above that there was a slightly prominent lymph node which did demonstrate a fatty hilum.  Physically on exam the mass was mobile and nontender and measured approximately 6.5 cm.  The ultrasound showed two small hypoechoic nodules in the left breast 3 cm. from the nipple measuring 7 mm. each and felt to be most likely benign however, the axillary mass, of course, was quite suspicious and biopsy was performed the same day.  This showed ([id number] and [id number]) invasive breast cancer which was strongly ER positive at 76%, moderately PR positive at 25% with a very high proliferation marker at 66%.  HER-2/neu was 1+ and FISH showed no amplification with a ratio of 1.1.    With this information, the patient was referred to Dr. [name] and on 12-20-05 the patient had bilateral breast MRI's.  In the left breast there was a 5.8 cm. axillary mass and several abnormally enlarged lymph nodes.  There was a 7 mm. left supraclavicular lymph node and the two nodules in the breast that had been noted by ultrasound were also noted by MRI measuring 1.1 and 0.7 cm.  In the right breast there were no  abnormalities noted.  MR guided biopsies of the left breast masses were obtained on 12-27-05 and both showed high grade ductal carcinoma.  There was evidence of lymphovascular invasion and high grade ductal carcinoma in situ as well ([id number]).    [name] discussed the situation with me and we felt it would be useful to have a PET scan prior to definitive surgery.  This was obtained on 12-30-05 and showed the primary axillary mass to have an SUV of 8.5.  The lymph nodes in the left axilla had SUV's in the 2.2 range, small focus of increased activity in the left breast had an SUV of 2.3.  There was also skin thickening with mildly increased diffuse FDG uptake within the skin suggesting an inflammatory breast cancer.  The neck, abdomen and  pelvis were negative.  In the lung windows, there were several scattered small areas of focal nodular density in the upper lobes with compressive atelectasis and this was felt to be benign.  Chest x-ray on 01-09-06 was negative.  With this information, [name] proceeded, after appropriate discussion, to left modified radical mastectomy on 01-15-06 and the insertion of a BARD port at the same time.  The final pathology ([id number]) showed the maximum tumor size to be 5 cm.  Margins were ample.  There was evidence of lymphovascular invasion.  This infiltrating ductal carcinoma was a grade 3 of 3 and 11 out of 14 lymph nodes were involved.  There was evidence of extracapsular extension.  Her subsequent history is as detailed below.   INTERVAL HISTORY: [name] had CT scans of the neck and chest yesterday, showing a good response in the neck, and a lesser response elsewhere. A more worrisome finding is bilateral hydronephrosis. I called her today (09/01/2012) with this information   REVIEW OF SYSTEMS: [name] denies fevers, chills, or night sweats. She denies any pleurisy or chest pain.  Her appetite is decreased, and she continues to lose weight. She's having regular bowel  movements with neither diarrhea nor constipation. She denies any signs of abnormal bleeding. She's had no abnormal headaches or dizziness. She has some occasional back pain, but otherwise no body aches currently, and denies any unusual myalgias, arthralgias, bony pain. No peripheral swelling. Her neuropathy is stable and has not increased since starting this regimen.  A detailed review of systems was otherwise stable.  PAST MEDICAL HISTORY: Past Medical History  Diagnosis Date  . Hypertension   . Depression   . breast ca dx'd 01/15/2006    chemo/xrt comp  . Breast cancer metastasized to multiple sites 07/13/2012  remote tonsillectomy, remote cholecystectomy, status post appendectomy, multiple skin biopsies for what the patient said were simple keratoses, and status post port placement.  FAMILY HISTORY: The patient has no information regarding her father. She was brought up by her stepfather. The patient's mother died at the age of 60 with bladder cancer. The patient has one sister who is in good health. There is no history of breast or ovarian cancer in the family to the patient's knowledge.   GYNECOLOGIC HISTORY: She is GX, P2. She was premenopausal at the time of her initial breast cancer diagnosis  SOCIAL HISTORY: She and her husband, Rebecca Pugh, have been married 10 years. 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 She has two children, a daughter, Rebecca Pugh, who lives in [place] and is a Investment banker, corporate, and a son, Rebecca Pugh, also in [place], who works for [employer]. She has two grandchildren and three step-grandchildren from her own two children, [name] and Rebecca Pugh, who are from her first marriage. Rebecca Pugh has one daughter from his first marriage and he has grandchildren through her. The patient was brought up as a Catholic but currently is not practicing. She and Rebecca Pugh are attending a [employer].      ADVANCED  DIRECTIVES:  HEALTH MAINTENANCE: History  Substance Use Topics  . Smoking status: Former Smoker -- 20 years    Quit date: 01/31/1995  . Smokeless tobacco: Never Used  . Alcohol Use: Yes     Comment: occasionl     Colonoscopy:  PAP:  Bone density: August 2012, "Normal"  Lipid panel:  No Known Allergies  Current Outpatient Prescriptions  Medication Sig Dispense Refill  . calcium citrate (CALCITRATE - DOSED IN MG ELEMENTAL CALCIUM) 950 MG tablet Take 1 tablet by mouth daily.        . cholecalciferol (VITAMIN D) 1000 UNITS tablet Take 3,000 Units by mouth daily.        . cholestyramine (QUESTRAN) 4 G packet Take 1 packet by mouth 2 (two) times daily with a meal. Mix with water.  60 each  1  . Desloratadine-Pseudoephedrine (CLARINEX-D 24 HOUR PO) Take 1 capsule by mouth daily.      Marland Kitchen dexamethasone (DECADRON) 4 MG tablet Take 2 tablets (8 mg total) by mouth 2 (two) times daily with a meal.  60 tablet  1  .  FLUoxetine (PROZAC) 40 MG capsule Take 1 capsule (40 mg total) by mouth daily.  30 capsule  3  . lidocaine-prilocaine (EMLA) cream Apply topically as needed.  30 g  3  . loperamide (IMODIUM) 2 MG capsule Take 2 mg by mouth 4 (four) times daily as needed. diarrhea      . LORazepam (ATIVAN) 0.5 MG tablet Take 1 tablet (0.5 mg total) by mouth 2 (two) times daily as needed.  60 tablet  0  . losartan (COZAAR) 50 MG tablet Take 1 tablet (50 mg total) by mouth daily.  30 tablet  6  . metoCLOPramide (REGLAN) 10 MG tablet Take 1 tablet (10 mg total) by mouth 4 (four) times daily.  60 tablet  2  . ondansetron (ZOFRAN) 8 MG tablet Take 1 tablet (8 mg total) by mouth 2 (two) times daily as needed for nausea.  20 tablet  0  . oxyCODONE (OXY IR/ROXICODONE) 5 MG immediate release tablet       . potassium chloride (MICRO-K) 10 MEQ CR capsule Take 2 capsules (20 mEq total) by mouth daily.  60 capsule  1  . predniSONE (DELTASONE) 5 MG tablet Take 1 tablet (5 mg total) by mouth daily. Take x 4 days after  discontinuing dexamethasone.  30 tablet  0  . prochlorperazine (COMPAZINE) 10 MG tablet Take 1 tablet (10 mg total) by mouth every 6 (six) hours as needed.  30 tablet  3  . tobramycin-dexamethasone (TOBRADEX) ophthalmic solution Place 1 drop into both eyes 2 (two) times daily.  5 mL  1   No current facility-administered medications for this visit.   Facility-Administered Medications Ordered in Other Visits  Medication Dose Route Frequency Provider Last Rate Last Dose  . cloNIDine (CATAPRES) tablet 0.2 mg  0.2 mg Oral Once [name] Allegra Grana, PA-C         OBJECTIVE:   There were no vitals filed for this visit.   There is no weight on file to calculate BMI.    ECOG FS: 1 There were no vitals filed for this visit. Middle age white female who appears anxious and tired but in no acute distress.  Sclerae unicteric Oropharynx clear Lungs are clear to auscultation, no rales or rhonchi Heart regular rate and rhythm Difficult to palpate a distinct adenopathy in the left supraclavicular region today, although there is some thickening to palpation. Otherwise no adenopathy is palpated. Abdomen is soft, nontender to palpation, with positive bowel sounds.  Neuro: nonfocal, well oriented, anxious affect Musculoskeletal, no focal spinal tenderness to gentle palpation. No peripheral edema Breasts: Deferred  LAB RESULTS:  Lab Results  Component Value Date   WBC 32.9* 08/24/2012   NEUTROABS 28.6* 08/24/2012   HGB 9.2* 08/24/2012   HCT 28.7* 08/24/2012   MCV 84.9 08/24/2012   PLT 392 08/24/2012      Chemistry      Component Value Date/Time   NA 139 08/17/2012 0929   NA 136 02/01/2012 0500   NA 142 11/05/2010 1036   K 3.6 08/17/2012 0929   K 4.5 02/01/2012 0500   K 4.5 11/05/2010 1036   CL 103 08/17/2012 0929   CL 102 02/01/2012 0500   CL 99 11/05/2010 1036   CO2 26 08/17/2012 0929   CO2 25 02/01/2012 0500   CO2 26 11/05/2010 1036   BUN 11.2 08/17/2012 0929   BUN 13 02/01/2012 0500   BUN 14 11/05/2010 1036    CREATININE 1.3* 08/17/2012 0929   CREATININE 0.97 02/01/2012 0500  CREATININE 0.8 11/05/2010 1036      Component Value Date/Time   CALCIUM 8.8 08/17/2012 0929   CALCIUM 8.6 02/01/2012 0500   CALCIUM 8.8 11/05/2010 1036   ALKPHOS 96 08/17/2012 0929   ALKPHOS 88 01/08/2012 0916   ALKPHOS 106* 11/05/2010 1036   AST 12 08/17/2012 0929   AST 24 01/08/2012 0916   AST 27 11/05/2010 1036   ALT 10 08/17/2012 0929   ALT 27 01/08/2012 0916   BILITOT 0.82 08/17/2012 0929   BILITOT 0.9 01/08/2012 0916   BILITOT 0.80 11/05/2010 1036       Lab Results  Component Value Date   LABCA2 107* 08/17/2012    STUDIES: Ct Soft Tissue Neck W Contrast  08/31/2012  *RADIOLOGY REPORT*  Clinical Data: Breast cancer.  Enlarged cervical lymph node.  CT NECK WITH CONTRAST  Technique:  Multidetector CT imaging of the neck was performed with intravenous contrast.  Contrast: OMNIPAQUE IOHEXOL 300 MG/ML  SOLN  Comparison: CT of the neck 05/29/2012  Findings: There is significant decrease in size of left supraclavicular lymph nodes.  The largest node now measures 9 x 11 x 8 mm.  The largest lesion measured 20 mm and maximal axial dimension.  Sub centimeter level II lymph nodes and a left 13 mm jugulodigastric node are stable.  Limited imaging of the brain is unremarkable.  The salivary glands are within normal limits bilaterally.  No significant mucosal or submucosal lesions are evident.  The vocal cords are midline and symmetric.  The bone windows are unremarkable.  IMPRESSION:  1.  Significant response to therapy with interval decrease in size of left supraclavicular adenopathy.   Original Report Authenticated By: Marin Roberts, M.D.    Ct Chest W Contrast  08/31/2012  *RADIOLOGY REPORT*  Clinical Data: 62 year old female with history of metastatic breast cancer.  CT CHEST WITH CONTRAST  Technique:  Multidetector CT imaging of the chest was performed following the standard protocol during bolus administration of intravenous  contrast.  Contrast: OMNIPAQUE IOHEXOL 300 MG/ML  SOLN In conjunction with CT(s) of the neck which is(are) reported separately.  Comparison: Chest CT 05/29/2012 and earlier.  Findings: Right chest Port-A-Cath re-identified.  Postoperative changes to the left chest wall and axilla again noted.  Major airways are patent.  Anterior subpleural density in the left lung appears stable and most likely related to radiation therapy. Stable mild peripheral right upper lobe opacity which may reflect scarring (series 5 image 22).  No new or suspicious pulmonary opacity.  At the thoracic inlet on the left and abnormal lower left level IV or superior prevascular lymph node has mildly decreased in size from 1.5 mm short axis to 10 mm short axis (series 2 image 8). However, also on this image is a posterior prevascular/apical lymph node which appears slightly increased to 9 mm short axis (previously 7-8 mm).  Other anterior and middle mediastinal nodal stations remain normal.  Posterior mediastinal/retrocrural lymphadenopathy is stable in the chest.  This is virtually contiguous with bulky retroperitoneal and pararenal lymphadenopathy in the abdomen which is stable or mildly decreased.  As before, this is inseparable from the left kidney.  Furthermore, there is asymmetric decreased enhancement of the left kidney.  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  As before, the left renal vein is not identified and there are capsular venous collaterals of the left kidney.  There is new bilateral hydronephrosis.  The proximal  ureters are only partially visible, but the bulky retroperitoneal disease might affect the ureters.  Stable liver with inferior right lobe hemangioma again suspected.  No abdominal free fluid.  Major mediastinal vascular structures appear be patent.  Stable visualized osseous structures.  Heterogeneous bone mineralization in the some vertebral  bodies.  No destructive osseous lesion identified.  IMPRESSION: 1.  Prevascular superior mediastinal metastatic lymph nodes show less response to therapy than those  in the neck (reported separately).  A 9 mm node on series 2 image 8 appears slightly larger. 2.  Stable to slightly decreased posterior mediastinal/retrocrural, and bulky abdominal and retroperitoneal lymphadenopathy, the latter only partially visible). 3.  New bilateral hydronephrosis, suspect bilateral ureteral obstruction due to the bulky retroperitoneal disease. 4.  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 5.  No new metastatic disease identified in the chest.  Study discussed by telephone with Dr. Si Gaul (covering for provider [name] Allyson Sabal) on 08/31/2012 at 1709 hours.   Original Report Authenticated By: Erskine Speed, M.D.       ASSESSMENT: 62 y.o.  [facility] woman with stage IV breast cancer.  1. 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 2. Status post 4 cycles of adjuvant docetaxel, carboplatin and trastuzumab completed November 2007. 3. Postmastectomy radiation completed February 2008. 4. Anastrozole begun February 2008 (the trastuzumab also was continued to complete a year), continued until June 2012.  5. 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  6. On tamoxifen/lapatinib/trastuzumab between June 2012 and October 2012.  7. Ixempra/lapatinib/trastuzumab started October 2012, the Ixempra discontinued in February 2013 due to peripheral neuropathy. 8. Continuing on lapatinib/trastuzumab with the addition of fulvestrant, first injections given on 07/31/2011. Lapatinib given at a dose of 1 g (4 tablets) daily, and trastuzumab given at 8 mg/kg every 4 weeks  to coordinate with injection appointments. 9. Discontinued lapatinib and fulvestrant in December 2013 10. Started q. three-week docetaxel/trastuzumab/pertuzumab, first dose given 06/15/2012.  Docetaxel given at a 50% dose reduction beginning with cycle 2 due to poor tolerance and severe neutropenia.  PLAN:  Baker has tolerated chemotherapy generally well, and the plan is going to be to do 2 more cycles and then discontinue the docetaxel but continue the trastuzumab/ pertuzumab. She understands we need to address the hydronephrosis problem, and I am contacting urology today. She already has an appointment to see me next Monday, and repeat echocardiogram next Tuesday.   MAGRINAT,GUSTAV C    09/01/2012

## 2012-09-01 NOTE — Telephone Encounter (Signed)
This RN contacted [facility] per MD request for urgent referral due to significant hydronephrosis per recent CT scan. MD has discussed results with pt. Per contact at [facility] this RN faxed over records for review for urgent appointment this week.  Records faxed to [phone].

## 2012-09-04 ENCOUNTER — Other Ambulatory Visit: Payer: Self-pay | Admitting: Urology

## 2012-09-07 ENCOUNTER — Other Ambulatory Visit (HOSPITAL_BASED_OUTPATIENT_CLINIC_OR_DEPARTMENT_OTHER): Payer: Medicare Other | Admitting: Lab

## 2012-09-07 ENCOUNTER — Ambulatory Visit (HOSPITAL_BASED_OUTPATIENT_CLINIC_OR_DEPARTMENT_OTHER): Payer: Medicare Other | Admitting: Oncology

## 2012-09-07 ENCOUNTER — Telehealth: Payer: Self-pay | Admitting: Oncology

## 2012-09-07 ENCOUNTER — Other Ambulatory Visit: Payer: Medicare Other | Admitting: Lab

## 2012-09-07 ENCOUNTER — Ambulatory Visit (HOSPITAL_BASED_OUTPATIENT_CLINIC_OR_DEPARTMENT_OTHER): Payer: Medicare Other

## 2012-09-07 ENCOUNTER — Encounter (HOSPITAL_COMMUNITY)
Admission: RE | Admit: 2012-09-07 | Discharge: 2012-09-07 | Disposition: A | Discharge status: Home / Self Care | Payer: Medicare Other | Source: Ambulatory Visit | Attending: Oncology | Admitting: Oncology

## 2012-09-07 ENCOUNTER — Ambulatory Visit: Payer: Medicare Other | Admitting: Oncology

## 2012-09-07 VITALS — BP 155/79 | HR 97 | Temp 98.0°F | Resp 20 | Ht 64.0 in | Wt 156.4 lb

## 2012-09-07 DIAGNOSIS — D649 Anemia, unspecified: Secondary | ICD-10-CM | POA: Insufficient documentation

## 2012-09-07 DIAGNOSIS — C50219 Malignant neoplasm of upper-inner quadrant of unspecified female breast: Secondary | ICD-10-CM

## 2012-09-07 DIAGNOSIS — C7970 Secondary malignant neoplasm of unspecified adrenal gland: Secondary | ICD-10-CM

## 2012-09-07 DIAGNOSIS — N1330 Unspecified hydronephrosis: Secondary | ICD-10-CM | POA: Insufficient documentation

## 2012-09-07 DIAGNOSIS — C50919 Malignant neoplasm of unspecified site of unspecified female breast: Secondary | ICD-10-CM

## 2012-09-07 DIAGNOSIS — C773 Secondary and unspecified malignant neoplasm of axilla and upper limb lymph nodes: Secondary | ICD-10-CM

## 2012-09-07 DIAGNOSIS — Z5111 Encounter for antineoplastic chemotherapy: Secondary | ICD-10-CM

## 2012-09-07 DIAGNOSIS — Z5112 Encounter for antineoplastic immunotherapy: Secondary | ICD-10-CM

## 2012-09-07 LAB — COMPREHENSIVE METABOLIC PANEL (CC13)
AST: 13 U/L (ref 5–34)
Albumin: 2.9 g/dL — ABNORMAL LOW (ref 3.5–5.0)
BUN: 13.3 mg/dL (ref 7.0–26.0)
Calcium: 9 mg/dL (ref 8.4–10.4)
Chloride: 101 mEq/L (ref 98–107)
Creatinine: 1.8 mg/dL — ABNORMAL HIGH (ref 0.6–1.1)
Glucose: 122 mg/dl — ABNORMAL HIGH (ref 70–99)
Potassium: 4 mEq/L (ref 3.5–5.1)

## 2012-09-07 LAB — CBC WITH DIFFERENTIAL/PLATELET
BASO%: 0.8 % (ref 0.0–2.0)
Basophils Absolute: 0.1 10*3/uL (ref 0.0–0.1)
EOS%: 1.9 % (ref 0.0–7.0)
HCT: 27.4 % — ABNORMAL LOW (ref 34.8–46.6)
HGB: 8.6 g/dL — ABNORMAL LOW (ref 11.6–15.9)
LYMPH%: 18.1 % (ref 14.0–49.7)
MCH: 27.5 pg (ref 25.1–34.0)
MCHC: 31.4 g/dL — ABNORMAL LOW (ref 31.5–36.0)
MONO#: 0.5 10*3/uL (ref 0.1–0.9)
NEUT%: 71.4 % (ref 38.4–76.8)
Platelets: 241 10*3/uL (ref 145–400)
lymph#: 1.2 10*3/uL (ref 0.9–3.3)

## 2012-09-07 LAB — HOLD TUBE, BLOOD BANK

## 2012-09-07 LAB — ABO/RH: ABO/RH(D): O POS

## 2012-09-07 MED ORDER — ACETAMINOPHEN 325 MG PO TABS
650.0000 mg | ORAL_TABLET | Freq: Once | ORAL | Status: AC
  Administered 2012-09-07: 650 mg via ORAL

## 2012-09-07 MED ORDER — SODIUM CHLORIDE 0.9 % IJ SOLN
10.0000 mL | INTRAMUSCULAR | Status: DC | PRN
  Administered 2012-09-07: 10 mL
  Filled 2012-09-07: qty 10

## 2012-09-07 MED ORDER — SODIUM CHLORIDE 0.9 % IV SOLN
420.0000 mg | Freq: Once | INTRAVENOUS | Status: AC
  Administered 2012-09-07: 420 mg via INTRAVENOUS
  Filled 2012-09-07: qty 14

## 2012-09-07 MED ORDER — HEPARIN SOD (PORK) LOCK FLUSH 100 UNIT/ML IV SOLN
500.0000 [IU] | Freq: Once | INTRAVENOUS | Status: AC | PRN
  Administered 2012-09-07: 500 [IU]
  Filled 2012-09-07: qty 5

## 2012-09-07 MED ORDER — SODIUM CHLORIDE 0.9 % IV SOLN
40.0000 mg/m2 | Freq: Once | INTRAVENOUS | Status: AC
  Administered 2012-09-07: 70 mg via INTRAVENOUS
  Filled 2012-09-07: qty 7

## 2012-09-07 MED ORDER — SODIUM CHLORIDE 0.9 % IV SOLN
Freq: Once | INTRAVENOUS | Status: AC
  Administered 2012-09-07: 13:00:00 via INTRAVENOUS

## 2012-09-07 MED ORDER — ONDANSETRON 16 MG/50ML IVPB (CHCC)
16.0000 mg | Freq: Once | INTRAVENOUS | Status: AC
  Administered 2012-09-07: 16 mg via INTRAVENOUS

## 2012-09-07 MED ORDER — SODIUM CHLORIDE 0.9 % IV SOLN
6.0000 mg/kg | Freq: Once | INTRAVENOUS | Status: AC
  Administered 2012-09-07: 441 mg via INTRAVENOUS
  Filled 2012-09-07: qty 21

## 2012-09-07 MED ORDER — DEXAMETHASONE SODIUM PHOSPHATE 10 MG/ML IJ SOLN
10.0000 mg | Freq: Once | INTRAMUSCULAR | Status: AC
  Administered 2012-09-07: 10 mg via INTRAVENOUS

## 2012-09-07 MED ORDER — DIPHENHYDRAMINE HCL 25 MG PO CAPS
25.0000 mg | ORAL_CAPSULE | Freq: Once | ORAL | Status: AC
  Administered 2012-09-07: 25 mg via ORAL

## 2012-09-07 NOTE — Progress Notes (Signed)
ID: Rebecca Pugh   DOB: 06-16-1951  MR#: [id number]  NWG#:[id number]  PCP: Rebecca Bur, MD GYN: SU: Rebecca Pugh  HISTORY OF PRESENT ILLNESS: The patient and her husband, Rebecca Pugh, moved back to this area recently from [place] (Rebecca Pugh actually is a native of [place] and incidentally remains a [place]).  As part of moving boxes, Rebecca Pugh felt something under her left arm.  She brought it to [place] Pugh attention and although the patient states that she has always had some cyclical swelling of her left axilla during menstruation, Rebecca Pugh set the patient up for evaluation at [place] P[place].  On 12-12-05 the patient had a mammogram and ultrasound there and these showed no mass, distortion or microcalcifications in either breast however, in the left axilla there was a large macrolobulated mass and just above that there was a slightly prominent lymph node which did demonstrate a fatty hilum.  Physically on exam the mass was mobile and nontender and measured approximately 6.5 cm.  The ultrasound showed two small hypoechoic nodules in the left breast 3 cm. from the nipple measuring 7 mm. each and felt to be most likely benign however, the axillary mass, of course, was quite suspicious and biopsy was performed the same day.  This showed ([id number] and [id number]) invasive breast cancer which was strongly ER positive at 76%, moderately PR positive at 25% with a very high proliferation marker at 66%.  HER-2/neu was 1+ and FISH showed no amplification with a ratio of 1.1.    With this information, the patient was referred to [place] Pugh and on 12-20-05 the patient had bilateral breast MRI's.  In the left breast there was a 5.8 cm. axillary mass and several abnormally enlarged lymph nodes.  There was a 7 mm. left supraclavicular lymph node and the two nodules in the breast that had been noted by ultrasound were also noted by MRI measuring 1.1 and 0.7 cm.  In the right breast there  were no abnormalities noted.  MR guided biopsies of the left breast masses were obtained on 12-27-05 and both showed high grade ductal carcinoma.  There was evidence of lymphovascular invasion and high grade ductal carcinoma in situ as well ([id number]).    Rebecca Pugh discussed the situation with me and we felt it would be useful to have a PET scan prior to definitive surgery.  This was obtained on 12-30-05 and showed the primary axillary mass to have an SUV of 8.5.  The lymph nodes in the left axilla had SUV's in the 2.2 range, small focus of increased activity in the left breast had an SUV of 2.3.  There was also skin thickening with mildly increased diffuse FDG uptake within the skin suggesting an inflammatory breast cancer.  The neck, abdomen and  pelvis were negative.  In the lung windows, there were several scattered small areas of focal nodular density in the upper lobes with compressive atelectasis and this was felt to be benign.  Chest x-ray on 01-09-06 was negative.  With this information, Rebecca Pugh proceeded, after appropriate discussion, to left modified radical mastectomy on 01-15-06 and the insertion of a BARD port at the same time.  The final pathology ([id number]) showed the maximum tumor size to be 5 cm.  Margins were ample.  There was evidence of lymphovascular invasion.  This infiltrating ductal carcinoma was a grade 3 of 3 and 11 out of 14 lymph nodes were involved.  There was evidence of  extracapsular extension.  Her subsequent history is as detailed below.   INTERVAL HISTORY: Rebecca Pugh with her husband Rebecca Pugh for followup of her breast cancer. Pugh is day 1 cycle 5 of her chemotherapy/anti-HER-2 therapy.  She had CT scans of the neck and chest 08/31/2012 showing a good response in the neck, and a lesser response elsewhere. A more worrisome finding was bilateral hydronephrosis. I called her with this information and set her up to see urology for consideration of  stenting.  REVIEW OF SYSTEMS: Rebecca Pugh thinks she is tolerating the chemotherapy "okay". She describes herself is moderately fatigued. She has a poor appetite. She has some anxiety and depression. On the other hand she is looking at her daughter's marriage, scheduled for May. So far she has had no problems with peripheral neuropathy beyond baseline. A detailed review of systems Pugh was otherwise stable.  PAST MEDICAL HISTORY: Past Medical History  Diagnosis Date  . Hypertension   . Depression   . breast ca dx'd 01/15/2006    chemo/xrt comp  . Breast cancer metastasized to multiple sites 07/13/2012  remote tonsillectomy, remote cholecystectomy, status post appendectomy, multiple skin biopsies for what the patient said were simple keratoses, and status post port placement.  FAMILY HISTORY: The patient has no information regarding her father. She was brought up by her stepfather. The patient's mother died at the age of 47 with bladder cancer. The patient has one sister who is in good health. There is no history of breast or ovarian cancer in the family to the patient's knowledge.   GYNECOLOGIC HISTORY: She is GX, P2. She was premenopausal at the time of her initial breast cancer diagnosis  SOCIAL HISTORY: She and her husband, Rebecca Pugh, have been married 10 years. 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 She has two children, a daughter, Rebecca Pugh, who lives in [place] and is a Investment banker, corporate, and a son, Rebecca Pugh, also in [place], who works for [employer]. She has two grandchildren and three step-grandchildren from her own two children, Rebecca Pugh and [place] Pugh, who are from her first marriage. Rebecca Pugh has one daughter from his first marriage and he has grandchildren through her. The patient was brought up as a Catholic but currently is not practicing. She and Rebecca Pugh are attending a [employer].      ADVANCED  DIRECTIVES:  HEALTH MAINTENANCE: History  Substance Use Topics  . Smoking status: Former Smoker -- 20 years    Quit date: 01/31/1995  . Smokeless tobacco: Never Used  . Alcohol Use: Yes     Comment: occasionl     Colonoscopy:  PAP:  Bone density: August 2012, "Normal"  Lipid panel:  No Known Allergies  Current Outpatient Prescriptions  Medication Sig Dispense Refill  . calcium citrate (CALCITRATE - DOSED IN MG ELEMENTAL CALCIUM) 950 MG tablet Take 1 tablet by mouth daily.        . cholecalciferol (VITAMIN D) 1000 UNITS tablet Take 3,000 Units by mouth daily.        . cholestyramine (QUESTRAN) 4 G packet Take 1 packet by mouth 2 (two) times daily with a meal. Mix with water.  60 each  1  . Desloratadine-Pseudoephedrine (CLARINEX-D 24 HOUR PO) Take 1 capsule by mouth daily.      Marland Kitchen dexamethasone (DECADRON) 4 MG tablet Take 2 tablets (8 mg total) by mouth 2 (two) times daily with a meal.  60 tablet  1  . FLUoxetine (PROZAC) 40 MG capsule Take 1 capsule (40 mg total) by mouth daily.  30 capsule  3  . lidocaine-prilocaine (EMLA) cream Apply topically as needed.  30 g  3  . loperamide (IMODIUM) 2 MG capsule Take 2 mg by mouth 4 (four) times daily as needed. diarrhea      . LORazepam (ATIVAN) 0.5 MG tablet Take 1 tablet (0.5 mg total) by mouth 2 (two) times daily as needed.  60 tablet  0  . losartan (COZAAR) 50 MG tablet Take 1 tablet (50 mg total) by mouth daily.  30 tablet  6  . metoCLOPramide (REGLAN) 10 MG tablet Take 1 tablet (10 mg total) by mouth 4 (four) times daily.  60 tablet  2  . ondansetron (ZOFRAN) 8 MG tablet Take 1 tablet (8 mg total) by mouth 2 (two) times daily as needed for nausea.  20 tablet  0  . oxyCODONE (OXY IR/ROXICODONE) 5 MG immediate release tablet       . potassium chloride (MICRO-K) 10 MEQ CR capsule Take 2 capsules (20 mEq total) by mouth daily.  60 capsule  1  . predniSONE (DELTASONE) 5 MG tablet Take 1 tablet (5 mg total) by mouth daily. Take x 4 days after  discontinuing dexamethasone.  30 tablet  0  . prochlorperazine (COMPAZINE) 10 MG tablet Take 1 tablet (10 mg total) by mouth every 6 (six) hours as needed.  30 tablet  3  . tobramycin-dexamethasone (TOBRADEX) ophthalmic solution Place 1 drop into both eyes 2 (two) times daily.  5 mL  1   No current facility-administered medications for this visit.   Facility-Administered Medications Ordered in Other Visits  Medication Dose Route Frequency Provider Last Rate Last Dose  . cloNIDine (CATAPRES) tablet 0.2 mg  0.2 mg Oral Once [name] Allegra Grana, PA-C         OBJECTIVE:    Filed Vitals:   09/07/12 1059  BP: 155/79  Pulse: 97  Temp: 98 F (36.7 C)  Resp: 20     Body mass index is 26.83 kg/(m^2).    ECOG FS: 1 Filed Weights   09/07/12 1059  Weight: 156 lb 6.4 oz (70.943 kg)   Middle age white woman in no acute distress.  Sclerae unicteric Oropharynx clear I no longer palpate well-defined adenopathy in the right supraclavicular area. Lungs are clear bilaterally, fair excursion Heart regular rate and rhythm Abdomen is soft, nontende, positive bowel sounds.  Neuro: nonfocal, well oriented, worried affect Musculoskeletal, no focal spinal tenderness  No peripheral edema Breasts: Deferred  LAB RESULTS:  Lab Results  Component Value Date   WBC 6.4 09/07/2012   NEUTROABS 4.6 09/07/2012   HGB 8.6* 09/07/2012   HCT 27.4* 09/07/2012   MCV 87.5 09/07/2012   PLT 241 09/07/2012      Chemistry      Component Value Date/Time   NA 139 08/17/2012 0929   NA 136 02/01/2012 0500   NA 142 11/05/2010 1036   K 3.6 08/17/2012 0929   K 4.5 02/01/2012 0500   K 4.5 11/05/2010 1036   CL 103 08/17/2012 0929   CL 102 02/01/2012 0500   CL 99 11/05/2010 1036   CO2 26 08/17/2012 0929   CO2 25 02/01/2012 0500   CO2 26 11/05/2010 1036   BUN 11.2 08/17/2012 0929   BUN 13 02/01/2012 0500   BUN 14 11/05/2010 1036   CREATININE 1.3* 08/17/2012 0929   CREATININE 0.97 02/01/2012 0500   CREATININE 0.8  11/05/2010 1036      Component  Value Date/Time   CALCIUM 8.8 08/17/2012 0929   CALCIUM 8.6 02/01/2012 0500   CALCIUM 8.8 11/05/2010 1036   ALKPHOS 96 08/17/2012 0929   ALKPHOS 88 01/08/2012 0916   ALKPHOS 106* 11/05/2010 1036   AST 12 08/17/2012 0929   AST 24 01/08/2012 0916   AST 27 11/05/2010 1036   ALT 10 08/17/2012 0929   ALT 27 01/08/2012 0916   BILITOT 0.82 08/17/2012 0929   BILITOT 0.9 01/08/2012 0916   BILITOT 0.80 11/05/2010 1036       Lab Results  Component Value Date   LABCA2 107* 08/17/2012    STUDIES: Ct Soft Tissue Neck W Contrast  08/31/2012  *RADIOLOGY REPORT*  Clinical Data: Breast cancer.  Enlarged cervical lymph node.  CT NECK WITH CONTRAST  Technique:  Multidetector CT imaging of the neck was performed with intravenous contrast.  Contrast: OMNIPAQUE IOHEXOL 300 MG/ML  SOLN  Comparison: CT of the neck 05/29/2012  Findings: There is significant decrease in size of left supraclavicular lymph nodes.  The largest node now measures 9 x 11 x 8 mm.  The largest lesion measured 20 mm and maximal axial dimension.  Sub centimeter level II lymph nodes and a left 13 mm jugulodigastric node are stable.  Limited imaging of the brain is unremarkable.  The salivary glands are within normal limits bilaterally.  No significant mucosal or submucosal lesions are evident.  The vocal cords are midline and symmetric.  The bone windows are unremarkable.  IMPRESSION:  1.  Significant response to therapy with interval decrease in size of left supraclavicular adenopathy.   Original Report Authenticated By: Marin Roberts, M.D.    Ct Chest W Contrast  08/31/2012  *RADIOLOGY REPORT*  Clinical Data: 62 year old female with history of metastatic breast cancer.  CT CHEST WITH CONTRAST  Technique:  Multidetector CT imaging of the chest was performed following the standard protocol during bolus administration of intravenous contrast.  Contrast: OMNIPAQUE IOHEXOL 300 MG/ML  SOLN In conjunction with CT(s) of the neck which is(are)  reported separately.  Comparison: Chest CT 05/29/2012 and earlier.  Findings: Right chest Port-A-Cath re-identified.  Postoperative changes to the left chest wall and axilla again noted.  Major airways are patent.  Anterior subpleural density in the left lung appears stable and most likely related to radiation therapy. Stable mild peripheral right upper lobe opacity which may reflect scarring (series 5 image 22).  No new or suspicious pulmonary opacity.  At the thoracic inlet on the left and abnormal lower left level IV or superior prevascular lymph node has mildly decreased in size from 1.5 mm short axis to 10 mm short axis (series 2 image 8). However, also on this image is a posterior prevascular/apical lymph node which appears slightly increased to 9 mm short axis (previously 7-8 mm).  Other anterior and middle mediastinal nodal stations remain normal.  Posterior mediastinal/retrocrural lymphadenopathy is stable in the chest.  This is virtually contiguous with bulky retroperitoneal and pararenal lymphadenopathy in the abdomen which is stable or mildly decreased.  As before, this is inseparable from the left kidney.  Furthermore, there is asymmetric decreased enhancement of the left kidney.  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  As before, the left renal vein is not identified and there are capsular venous collaterals of the left kidney.  There is new bilateral hydronephrosis.  The proximal ureters  are only partially visible, but the bulky retroperitoneal disease might affect the ureters.  Stable liver with inferior right lobe hemangioma again suspected.  No abdominal free fluid.  Major mediastinal vascular structures appear be patent.  Stable visualized osseous structures.  Heterogeneous bone mineralization in the some vertebral bodies.  No destructive osseous lesion identified.  IMPRESSION: 1.  Prevascular superior mediastinal metastatic lymph  nodes show less response to therapy than those  in the neck (reported separately).  A 9 mm node on series 2 image 8 appears slightly larger. 2.  Stable to slightly decreased posterior mediastinal/retrocrural, and bulky abdominal and retroperitoneal lymphadenopathy, the latter only partially visible). 3.  New bilateral hydronephrosis, suspect bilateral ureteral obstruction due to the bulky retroperitoneal disease. 4.  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 5.  No new metastatic disease identified in the chest.  Study discussed by telephone with Dr. Si Gaul (covering for provider [name] Allyson Sabal) on 08/31/2012 at 1709 hours.   Original Report Authenticated By: Erskine Speed, M.D.       ASSESSMENT: 62 y.o.  [place] Pugh [place] woman with stage IV breast cancer.  1. 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 2. Status post 4 cycles of adjuvant docetaxel, carboplatin and trastuzumab completed November 2007. 3. Postmastectomy radiation completed February 2008. 4. Anastrozole begun February 2008 (the trastuzumab also was continued to complete a year), continued until June 2012.  5. 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  6. On tamoxifen/lapatinib/trastuzumab between June 2012 and October 2012.  7. Ixempra/lapatinib/trastuzumab started October 2012, the Ixempra discontinued in February 2013 due to peripheral neuropathy. 8. Continuing on lapatinib/trastuzumab with the addition of fulvestrant, first injections given on 07/31/2011. Lapatinib given at a dose of 1 g (4 tablets) daily, and trastuzumab given at 8 mg/kg every 4 weeks to coordinate with injection appointments. 9. Discontinued lapatinib and fulvestrant in December 2013 10. Started q.  three-week docetaxel/ trastuzumab/ pertuzumab, first dose given 06/15/2012.  Docetaxel given at a 50% dose reduction beginning with cycle 2 due to poor tolerance and severe neutropenia. Most recent echocardiogram 09/08/2012 11. Bilateral hydronephrosis noted March 2014, bilateral ureteral stenting planned for 09/11/2012  PLAN:  Gennesis is tolerating chemotherapy well, and so far has developed no significant peripheral neuropathy. I think we should do 2 more cycles of the docetaxel with trastuzumab and pertuzumab and then continue with the anti-bodies only. We can start letrozole at that time.   In the meantime she is scheduled for ureteral stenting later this week. This of course will have to be changed every 3 months. This is discouraging to [place], but my suggestion to her is that she think in 3 months periods and not in terms of "the rest of my life". Possibly at some point we may be able to remove the stents permanently. In the meantime we are proceeding with treatment Pugh. She knows to call for any problems that may develop before the next visit.     Retha Bither C    09/07/2012

## 2012-09-07 NOTE — Telephone Encounter (Signed)
No pof. Blood already added for 3/26. Pt given schedule for March and April.

## 2012-09-07 NOTE — Patient Instructions (Signed)
Patient aware of next appointment; discharged home with no complaints. 

## 2012-09-08 ENCOUNTER — Encounter (HOSPITAL_COMMUNITY): Payer: Self-pay

## 2012-09-08 ENCOUNTER — Ambulatory Visit (HOSPITAL_BASED_OUTPATIENT_CLINIC_OR_DEPARTMENT_OTHER): Payer: Medicare Other

## 2012-09-08 ENCOUNTER — Encounter (HOSPITAL_BASED_OUTPATIENT_CLINIC_OR_DEPARTMENT_OTHER): Payer: Self-pay | Admitting: *Deleted

## 2012-09-08 ENCOUNTER — Ambulatory Visit (HOSPITAL_COMMUNITY)
Admission: RE | Admit: 2012-09-08 | Discharge: 2012-09-08 | Disposition: A | Discharge status: Home / Self Care | Payer: Medicare Other | Source: Ambulatory Visit | Attending: Family Medicine | Admitting: Family Medicine

## 2012-09-08 ENCOUNTER — Ambulatory Visit (HOSPITAL_BASED_OUTPATIENT_CLINIC_OR_DEPARTMENT_OTHER)
Admission: RE | Admit: 2012-09-08 | Discharge: 2012-09-08 | Disposition: A | Discharge status: Home / Self Care | Payer: Medicare Other | Source: Ambulatory Visit | Attending: Internal Medicine | Admitting: Internal Medicine

## 2012-09-08 VITALS — BP 162/84 | HR 82 | Wt 157.0 lb

## 2012-09-08 VITALS — BP 135/66 | HR 91 | Temp 98.4°F

## 2012-09-08 DIAGNOSIS — C50219 Malignant neoplasm of upper-inner quadrant of unspecified female breast: Secondary | ICD-10-CM

## 2012-09-08 DIAGNOSIS — C50912 Malignant neoplasm of unspecified site of left female breast: Secondary | ICD-10-CM

## 2012-09-08 DIAGNOSIS — I10 Essential (primary) hypertension: Secondary | ICD-10-CM

## 2012-09-08 DIAGNOSIS — Z5189 Encounter for other specified aftercare: Secondary | ICD-10-CM

## 2012-09-08 DIAGNOSIS — I517 Cardiomegaly: Secondary | ICD-10-CM

## 2012-09-08 DIAGNOSIS — C50919 Malignant neoplasm of unspecified site of unspecified female breast: Secondary | ICD-10-CM | POA: Insufficient documentation

## 2012-09-08 DIAGNOSIS — C773 Secondary and unspecified malignant neoplasm of axilla and upper limb lymph nodes: Secondary | ICD-10-CM

## 2012-09-08 DIAGNOSIS — I059 Rheumatic mitral valve disease, unspecified: Secondary | ICD-10-CM | POA: Insufficient documentation

## 2012-09-08 DIAGNOSIS — D649 Anemia, unspecified: Secondary | ICD-10-CM

## 2012-09-08 DIAGNOSIS — C800 Disseminated malignant neoplasm, unspecified: Secondary | ICD-10-CM

## 2012-09-08 DIAGNOSIS — I079 Rheumatic tricuspid valve disease, unspecified: Secondary | ICD-10-CM | POA: Insufficient documentation

## 2012-09-08 HISTORY — PX: TRANSTHORACIC ECHOCARDIOGRAM: SHX275

## 2012-09-08 MED ORDER — LOSARTAN POTASSIUM 50 MG PO TABS: 50.0000 mg | ORAL_TABLET | Freq: Every day | ORAL | Status: DC

## 2012-09-08 MED ORDER — PEGFILGRASTIM INJECTION 6 MG/0.6ML
6.0000 mg | Freq: Once | SUBCUTANEOUS | Status: AC
  Administered 2012-09-08: 6 mg via SUBCUTANEOUS
  Filled 2012-09-08: qty 0.6

## 2012-09-08 NOTE — Assessment & Plan Note (Addendum)
SBP elevated. Out of losartan for over a week. Will restart losartan 50 mg daily.

## 2012-09-08 NOTE — Patient Instructions (Addendum)
Follow up in 3 months with Dr Gala Romney and an ECHO

## 2012-09-08 NOTE — Progress Notes (Signed)
NPO AFTER MN. ARRIVES AT 1130. NEEDS ISTAT. CURRENT CHEST CT AND ECHO IN [facility] . MAY TAKE PRN MEDS IF NEEDED AM OF SURG W/ SIPS OF WATER.

## 2012-09-08 NOTE — Progress Notes (Signed)
  Echocardiogram 2D Echocardiogram has been performed.  Rebecca Pugh A 09/08/2012, 9:57 AM

## 2012-09-08 NOTE — Assessment & Plan Note (Addendum)
I reviewed echos personally. EF and Doppler parameters stable. No HF on exam. Continue Herceptin.   

## 2012-09-09 ENCOUNTER — Ambulatory Visit (HOSPITAL_BASED_OUTPATIENT_CLINIC_OR_DEPARTMENT_OTHER): Payer: Medicare Other

## 2012-09-09 ENCOUNTER — Other Ambulatory Visit: Payer: Self-pay | Admitting: Oncology

## 2012-09-09 VITALS — BP 155/71 | HR 83 | Temp 98.1°F | Resp 20

## 2012-09-09 DIAGNOSIS — N1330 Unspecified hydronephrosis: Secondary | ICD-10-CM

## 2012-09-09 DIAGNOSIS — N039 Chronic nephritic syndrome with unspecified morphologic changes: Secondary | ICD-10-CM

## 2012-09-09 DIAGNOSIS — C50219 Malignant neoplasm of upper-inner quadrant of unspecified female breast: Secondary | ICD-10-CM

## 2012-09-09 DIAGNOSIS — C50919 Malignant neoplasm of unspecified site of unspecified female breast: Secondary | ICD-10-CM

## 2012-09-09 MED ORDER — SODIUM CHLORIDE 0.9 % IJ SOLN
10.0000 mL | INTRAMUSCULAR | Status: AC | PRN
  Administered 2012-09-09: 10 mL
  Filled 2012-09-09: qty 10

## 2012-09-09 MED ORDER — ACETAMINOPHEN 325 MG PO TABS
650.0000 mg | ORAL_TABLET | Freq: Once | ORAL | Status: AC
  Administered 2012-09-09: 650 mg via ORAL

## 2012-09-09 MED ORDER — SODIUM CHLORIDE 0.9 % IV SOLN
250.0000 mL | Freq: Once | INTRAVENOUS | Status: AC
  Administered 2012-09-09: 250 mL via INTRAVENOUS

## 2012-09-09 MED ORDER — HEPARIN SOD (PORK) LOCK FLUSH 100 UNIT/ML IV SOLN
500.0000 [IU] | Freq: Every day | INTRAVENOUS | Status: AC | PRN
  Administered 2012-09-09: 500 [IU]
  Filled 2012-09-09: qty 5

## 2012-09-09 MED ORDER — DIPHENHYDRAMINE HCL 25 MG PO CAPS
25.0000 mg | ORAL_CAPSULE | Freq: Once | ORAL | Status: AC
  Administered 2012-09-09: 25 mg via ORAL

## 2012-09-09 NOTE — Progress Notes (Signed)
Patient ID: Rebecca Pugh, female   DOB: Jul 02, 1950, 62 y.o.   MRN: [id number] Referring Physician: Dr. Darnelle Pugh Primary Care: Dr. Lalla Pugh  Primary Cardiologist: none  HPI: Rebecca Pugh is a 62 y.o. female with history of stage IV breast cancer initially diagnosed in 2007.  She underwent 4 cycles of adjuvant paclitaxel/carboplatin/trastuzumab (completed 04/2006) and trastuzumab continued for 1 year.  She had postmastectomy radiation completed 07/2006.  Anastrozole began in 07/2006.  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  Started on tamoxifen, lapatinib, and trastuzumab between June and October of 2012, with progression.  She is now on ixabepilone, lapatinib, and trastuzumab beginning November of 2012, with fair tolerance.  She has completed four cycles of ixabepilone/lapatinib and will remain on herceptin indefinitely.    Echos: 11/2010: EF 60-65% with poor windows to evaluate lateral S' 03/2011: EF60-65% with lateral S' peaks of 10.1 07/10/11: EF 55-60% lateral s' 9.6 10/08/11: EF 60%, lateral s' 9.6 01/09/12: EF 60-65%, lateral s' 11.5, 11.08 05/21/12: EF 60%, lateral s' 10.9 09/08/12: EF 65-70% lateral s' 10.9  She returns for follow up today.  Complains of fatigue. She continues on herceptin q3 weeks.  She denies SOB/orthopnea/PND or edema.  She has been out of losartan at least 1 week. For transfusion tomorrow.   Review of Systems: All pertinent positives and negatives as in HPI, otherwise negative.     Past Medical History  Diagnosis Date  . Hypertension   . Depression   . Hydronephrosis, bilateral   . Breast cancer metastasized to multiple sites POSITIVE METS IN 2012-----   ONCOLOGIST--  DR Rebecca Pugh--  CURRENTLY RECEIVING CHEMO  EVERY 3 WEEKS (STARTED 06-15-2012)    FIRST DX 01-15-2006, INVASIVE DUCTAL GRADE III/    S/P LEFT MASTECTOMY AND CHEMORADIATION (CHEMO COMPLETE NOV 2007 AND XRT COMPLETE FEB 2008)    Current Outpatient Prescriptions  Medication Sig Dispense Refill  . calcium citrate (CALCITRATE - DOSED IN MG ELEMENTAL CALCIUM) 950 MG tablet Take 1 tablet by mouth daily.       . cholecalciferol (VITAMIN D) 1000 UNITS tablet Take 3,000 Units by mouth daily.       . Desloratadine-Pseudoephedrine (CLARINEX-D 24 HOUR PO) Take 1 capsule by mouth daily.      Marland Kitchen dexamethasone (DECADRON) 4 MG tablet Take 2 tablets (8 mg total) by mouth 2 (two) times daily with a meal.  60 tablet  1  . FLUoxetine (PROZAC) 40 MG capsule Take 1 capsule (40 mg total) by mouth daily.  30 capsule  3  . lidocaine-prilocaine (EMLA) cream Apply topically as needed.  30 g  3  . loperamide (IMODIUM) 2 MG capsule Take 2 mg by mouth 4 (four) times daily as needed. diarrhea      . LORazepam (ATIVAN) 0.5 MG tablet Take 1 tablet (0.5 mg total) by mouth 2 (two) times daily as needed.  60 tablet  0  . oxyCODONE (OXY IR/ROXICODONE) 5 MG immediate release tablet Take 5 mg by mouth every 4 (four) hours as needed.       . potassium chloride (MICRO-K) 10 MEQ CR capsule Take 2 capsules (20 mEq total) by mouth daily.  60 capsule  1  . prochlorperazine (COMPAZINE) 10 MG tablet  Take 1 tablet (10 mg total) by mouth every 6 (six) hours as needed.  30 tablet  3  . tobramycin-dexamethasone (TOBRADEX) ophthalmic solution Place 1 drop into both eyes 2 (two) times daily.  5 mL  1  . cholestyramine (QUESTRAN) 4 G packet Take 1 packet by mouth 2 (two) times daily with a meal. Mix with water.      Marland Kitchen losartan (COZAAR) 50 MG tablet Take 1 tablet (50 mg total) by mouth daily.  30 tablet  6  . metoCLOPramide (REGLAN) 10 MG tablet Take 10 mg by mouth 4 (four) times daily.      . ondansetron (ZOFRAN) 8 MG tablet Take 8 mg by  mouth 2 (two) times daily as needed for nausea.      . predniSONE (DELTASONE) 5 MG tablet Take 5 mg by mouth daily. Take x 4 days after discontinuing dexamethasone.-- TO START ON 09-12-2012       No current facility-administered medications for this encounter.   Facility-Administered Medications Ordered in Other Encounters  Medication Dose Route Frequency Provider Last Rate Last Dose  . cloNIDine (CATAPRES) tablet 0.2 mg  0.2 mg Oral Once Rebecca G Berry, PA-C        No Known Allergies   PHYSICAL EXAM: Filed Vitals:   09/08/12 1026  BP: 162/84  Pulse: 82  Weight: 157 lb (71.215 kg)  SpO2: 97%   General:  Well appearing. No respiratory difficulty HEENT: Normal Neck: supple. no JVD. Carotids 2+ bilat; no bruits. No lymphadenopathy or thryomegaly appreciated. Cor: PMI nondisplaced. Regular rate & rhythm. No rubs, gallops or murmurs. Lungs: clear Abdomen: soft, nontender, nondistended. No hepatosplenomegaly. No bruits or masses. Good bowel sounds. Extremities: no cyanosis, clubbing, rash, edema Neuro: alert & oriented x 3, cranial nerves grossly intact. moves all 4 extremities w/o difficulty. Affect pleasant.   ASSESSMENT & PLAN:

## 2012-09-09 NOTE — Patient Instructions (Signed)
Blood Transfusion   A blood transfusion replaces your blood or some of its parts. Blood is replaced when you have lost blood because of surgery, an accident, or for severe blood conditions like anemia.  You can donate blood to be used on yourself if you have a planned surgery. If you lose blood during that surgery, your own blood can be given back to you.  Any blood given to you is checked to make sure it matches your blood type. Your temperature, blood pressure, and heart rate (vital signs) will be checked often.   GET HELP RIGHT AWAY IF:    You feel sick to your stomach (nauseous) or throw up (vomit).   You have watery poop (diarrhea).   You have shortness of breath or trouble breathing.   You have blood in your pee (urine) or have dark colored pee.   You have chest pain or tightness.   Your eyes or skin turn yellow (jaundice).   You have a temperature by mouth above 102 F (38.9 C), not controlled by medicine.   You start to shake and have chills.   You develop a a red rash (hives) or feel itchy.   You develop lightheadedness or feel confused.   You develop back, joint, or muscle pain.   You do not feel hungry (lost appetite).   You feel tired, restless, or nervous.   You develop belly (abdominal) cramps.  Document Released: 08/30/2008 Document Revised: 08/26/2011 Document Reviewed: 08/30/2008  Exit[employer] Patient Information 2013 [employer].

## 2012-09-10 LAB — TYPE AND SCREEN
ABO/RH(D): O POS
Antibody Screen: NEGATIVE
Unit division: 0

## 2012-09-11 ENCOUNTER — Encounter (HOSPITAL_BASED_OUTPATIENT_CLINIC_OR_DEPARTMENT_OTHER): Payer: Self-pay | Admitting: *Deleted

## 2012-09-11 ENCOUNTER — Encounter (HOSPITAL_BASED_OUTPATIENT_CLINIC_OR_DEPARTMENT_OTHER): Payer: Self-pay | Admitting: Anesthesiology

## 2012-09-11 ENCOUNTER — Encounter (HOSPITAL_BASED_OUTPATIENT_CLINIC_OR_DEPARTMENT_OTHER)
Admission: RE | Disposition: A | Discharge status: Home / Self Care | Payer: Self-pay | Source: Ambulatory Visit | Attending: Urology

## 2012-09-11 ENCOUNTER — Ambulatory Visit (HOSPITAL_BASED_OUTPATIENT_CLINIC_OR_DEPARTMENT_OTHER)
Admission: RE | Admit: 2012-09-11 | Discharge: 2012-09-11 | Disposition: A | Discharge status: Home / Self Care | Payer: Medicare Other | Source: Ambulatory Visit | Attending: Urology | Admitting: Urology

## 2012-09-11 ENCOUNTER — Ambulatory Visit (HOSPITAL_BASED_OUTPATIENT_CLINIC_OR_DEPARTMENT_OTHER): Payer: Medicare Other | Admitting: Anesthesiology

## 2012-09-11 DIAGNOSIS — Z9010 Acquired absence of unspecified breast and nipple: Secondary | ICD-10-CM | POA: Insufficient documentation

## 2012-09-11 DIAGNOSIS — Z79899 Other long term (current) drug therapy: Secondary | ICD-10-CM | POA: Insufficient documentation

## 2012-09-11 DIAGNOSIS — C50919 Malignant neoplasm of unspecified site of unspecified female breast: Secondary | ICD-10-CM | POA: Insufficient documentation

## 2012-09-11 DIAGNOSIS — F17200 Nicotine dependence, unspecified, uncomplicated: Secondary | ICD-10-CM | POA: Insufficient documentation

## 2012-09-11 DIAGNOSIS — R599 Enlarged lymph nodes, unspecified: Secondary | ICD-10-CM | POA: Insufficient documentation

## 2012-09-11 DIAGNOSIS — N1330 Unspecified hydronephrosis: Secondary | ICD-10-CM | POA: Insufficient documentation

## 2012-09-11 DIAGNOSIS — C801 Malignant (primary) neoplasm, unspecified: Secondary | ICD-10-CM | POA: Insufficient documentation

## 2012-09-11 HISTORY — PX: CYSTOSCOPY W/ URETERAL STENT PLACEMENT: SHX1429

## 2012-09-11 HISTORY — DX: Unspecified hydronephrosis: N13.30

## 2012-09-11 LAB — POCT I-STAT 4, (NA,K, GLUC, HGB,HCT)
HCT: 37 % (ref 36.0–46.0)
Hemoglobin: 12.6 g/dL (ref 12.0–15.0)

## 2012-09-11 SURGERY — CYSTOSCOPY, WITH RETROGRADE PYELOGRAM AND URETERAL STENT INSERTION
Anesthesia: General | Site: Ureter | Laterality: Bilateral | Wound class: Clean Contaminated

## 2012-09-11 MED ORDER — DEXAMETHASONE SODIUM PHOSPHATE 4 MG/ML IJ SOLN
INTRAMUSCULAR | Status: DC | PRN
  Administered 2012-09-11: 8 mg via INTRAVENOUS

## 2012-09-11 MED ORDER — CEFAZOLIN SODIUM 1-5 GM-% IV SOLN
1.0000 g | INTRAVENOUS | Status: DC
  Filled 2012-09-11: qty 50

## 2012-09-11 MED ORDER — PROMETHAZINE HCL 25 MG/ML IJ SOLN
6.2500 mg | INTRAMUSCULAR | Status: DC | PRN
  Filled 2012-09-11: qty 1

## 2012-09-11 MED ORDER — CEFAZOLIN SODIUM-DEXTROSE 2-3 GM-% IV SOLR
2.0000 g | INTRAVENOUS | Status: DC
  Filled 2012-09-11: qty 50

## 2012-09-11 MED ORDER — LIDOCAINE HCL (CARDIAC) 20 MG/ML IV SOLN
INTRAVENOUS | Status: DC | PRN
  Administered 2012-09-11: 75 mg via INTRAVENOUS

## 2012-09-11 MED ORDER — FENTANYL CITRATE 0.05 MG/ML IJ SOLN
INTRAMUSCULAR | Status: DC | PRN
  Administered 2012-09-11: 25 ug via INTRAVENOUS
  Administered 2012-09-11: 50 ug via INTRAVENOUS
  Administered 2012-09-11 (×3): 25 ug via INTRAVENOUS

## 2012-09-11 MED ORDER — LACTATED RINGERS IV SOLN
INTRAVENOUS | Status: DC
  Administered 2012-09-11: 100 mL/h via INTRAVENOUS
  Administered 2012-09-11: 16:00:00 via INTRAVENOUS
  Filled 2012-09-11: qty 1000

## 2012-09-11 MED ORDER — LACTATED RINGERS IV SOLN
INTRAVENOUS | Status: DC
  Filled 2012-09-11: qty 1000

## 2012-09-11 MED ORDER — ONDANSETRON HCL 4 MG/2ML IJ SOLN
INTRAMUSCULAR | Status: DC | PRN
  Administered 2012-09-11: 4 mg via INTRAVENOUS

## 2012-09-11 MED ORDER — FENTANYL CITRATE 0.05 MG/ML IJ SOLN
25.0000 ug | INTRAMUSCULAR | Status: DC | PRN
  Filled 2012-09-11: qty 1

## 2012-09-11 MED ORDER — SODIUM CHLORIDE 0.9 % IR SOLN
Status: DC | PRN
  Administered 2012-09-11: 3000 mL

## 2012-09-11 MED ORDER — IOHEXOL 350 MG/ML SOLN
INTRAVENOUS | Status: DC | PRN
  Administered 2012-09-11: 17 mL via URETHRAL

## 2012-09-11 MED ORDER — PROPOFOL 10 MG/ML IV BOLUS
INTRAVENOUS | Status: DC | PRN
  Administered 2012-09-11: 180 mg via INTRAVENOUS

## 2012-09-11 MED ORDER — CEFAZOLIN SODIUM-DEXTROSE 2-3 GM-% IV SOLR
INTRAVENOUS | Status: DC | PRN
  Administered 2012-09-11: 2 g via INTRAVENOUS

## 2012-09-11 MED ORDER — KETOROLAC TROMETHAMINE 30 MG/ML IJ SOLN
INTRAMUSCULAR | Status: DC | PRN
  Administered 2012-09-11: 30 mg via INTRAVENOUS

## 2012-09-11 MED ORDER — MIDAZOLAM HCL 5 MG/5ML IJ SOLN
INTRAMUSCULAR | Status: DC | PRN
  Administered 2012-09-11 (×2): 1 mg via INTRAVENOUS

## 2012-09-11 MED ORDER — LACTATED RINGERS IV SOLN
INTRAVENOUS | Status: DC | PRN
  Administered 2012-09-11: 12:00:00 via INTRAVENOUS

## 2012-09-11 MED ORDER — MEPERIDINE HCL 25 MG/ML IJ SOLN
6.2500 mg | INTRAMUSCULAR | Status: DC | PRN
  Filled 2012-09-11: qty 1

## 2012-09-11 SURGICAL SUPPLY — 20 items
BAG DRAIN URO-CYSTO SKYTR STRL (DRAIN) ×2 IMPLANT
BAG DRN UROCATH (DRAIN) ×1
CANISTER SUCT LVC 12 LTR MEDI- (MISCELLANEOUS) ×2 IMPLANT
CATH INTERMIT  6FR 70CM (CATHETERS) IMPLANT
CATH URET 5FR 28IN CONE TIP (BALLOONS) ×1
CATH URET 5FR 70CM CONE TIP (BALLOONS) IMPLANT
CLOTH BEACON ORANGE TIMEOUT ST (SAFETY) ×2 IMPLANT
DRAPE CAMERA CLOSED 9X96 (DRAPES) IMPLANT
GLOVE BIO SURGEON STRL SZ7 (GLOVE) ×2 IMPLANT
GLOVE BIOGEL M 6.5 STRL (GLOVE) ×1 IMPLANT
GLOVE BIOGEL M 7.0 STRL (GLOVE) ×1 IMPLANT
GLOVE ECLIPSE 6.0 STRL STRAW (GLOVE) ×1 IMPLANT
GOWN SURGICAL LARGE (GOWNS) ×3 IMPLANT
GUIDEWIRE 0.038 PTFE COATED (WIRE) IMPLANT
GUIDEWIRE ANG ZIPWIRE 038X150 (WIRE) IMPLANT
GUIDEWIRE STR DUAL SENSOR (WIRE) IMPLANT
IV NS IRRIG 3000ML ARTHROMATIC (IV SOLUTION) ×1 IMPLANT
NS IRRIG 500ML POUR BTL (IV SOLUTION) IMPLANT
PACK CYSTOSCOPY (CUSTOM PROCEDURE TRAY) ×2 IMPLANT
STENT POLARIS LOOP 6FR X 26 CM (STENTS) ×2 IMPLANT

## 2012-09-11 NOTE — Op Note (Signed)
Rebecca Pugh is a 62 y.o.   09/11/2012  Pre-op diagnosis: Bilateral hydronephrosis secondary to metastatic breast cancer  Postop diagnosis: Same  Procedure done: Cystoscopy, bilateral retrograde pyelogram, insertion of bilateral double-J stents  Surgeon: Wendie Simmer. Jonte Shiller  Anesthesia: General  Indication: Patient is a 62 years old female with metastatic breast cancer who was found on CT scan of the chest to have bilateral hydronephrosis secondary to bulky retroperitoneal and pararenal lymphadenopathy. She is scheduled for continued to receive chemotherapy. She is scheduled today for cystoscopy, retrograde pyelograms and insertion of double-J stents  Procedure: Patient was identified by her wrist band and proper timeout was taken.  Under general anesthesia she was prepped and draped and placed in the dorsolithotomy position. 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 The meatus was then dilated up to #30 [place] and a panendoscope was inserted in the bladder. The bladder mucosa is normal. There is no stone or tumor in the bladder. The ureteral orifices are in normal position and shape.  Right retrograde pyelogram:  A cone tip catheter was passed through the cystoscope and the right ureteral orifice. 8 cc of Omnipaque were injected through the cone-tip catheter. The ureter appears normal. The renal pelvis and calyces are moderately dilated. There is no evidence of filling defect in the ureter nor in the collecting system. The cone-tip catheter was then removed.  A sensor wire was passed through the cystoscope and the right ureter. A #6 French-26 Polaris stent was passed over the sensor wire. The sensor wire was then removed. The proximal curl of the double-J stent is in the renal pelvis. The loops of the Polaris stent are in the bladder.  Left retrograde pyelogram:  The cone-tip catheter was passed through the cystoscope and left ureteral orifice. 9 cc of  contrast were then injected through the cone-tip catheter. The ureter appears normal. The proximal ureter shows some tortuosity probably secondary to extrinsic pressure. The renal pelvis and calyces are also moderately dilated and more so on on this side and the right side. The cone-tip catheter was then removed.  A sensor wire was passed through the cystoscope and the left ureter. A #6 French-26 Polaris stent was passed over the sensor wire. When the tip of the stent was identified in the renal pelvis the sensor wire was removed. The proximal curl of the double-J stent is in the renal pelvis and the loops of the stent are in the bladder.  The bladder was then emptied and the cystoscope removed.  The patient tolerated the procedure well and left the OR in satisfactory condition to postanesthesia care unit.  Cc: Dr. Ruthann Cancer

## 2012-09-11 NOTE — Anesthesia Preprocedure Evaluation (Addendum)
Anesthesia Evaluation  Patient identified by MRN, date of birth, ID band Patient awake    Reviewed: Allergy & Precautions, H&P , NPO status , Patient's Chart, lab work & pertinent test results  Airway Mallampati: II TM Distance: <3 FB Neck ROM: Full    Dental no notable dental hx. (+) Teeth Intact and Dental Advisory Given   Pulmonary neg pulmonary ROS,  breath sounds clear to auscultation  Pulmonary exam normal       Cardiovascular hypertension, Pt. on medications negative cardio ROS  Rhythm:Regular Rate:Normal     Neuro/Psych negative neurological ROS  negative psych ROS   GI/Hepatic negative GI ROS, Neg liver ROS,   Endo/Other  negative endocrine ROS  Renal/GU Renal diseasenegative Renal ROS  negative genitourinary   Musculoskeletal negative musculoskeletal ROS (+)   Abdominal   Peds  Hematology negative hematology ROS (+)   Anesthesia Other Findings   Reproductive/Obstetrics negative OB ROS Breast CA with abdominal mets                           Anesthesia Physical Anesthesia Plan  ASA: III  Anesthesia Plan: General   Post-op Pain Management:    Induction: Intravenous  Airway Management Planned: LMA  Additional Equipment:   Intra-op Plan:   Post-operative Plan: Extubation in OR  Informed Consent: I have reviewed the patients History and Physical, chart, labs and discussed the procedure including the risks, benefits and alternatives for the proposed anesthesia with the patient or authorized representative who has indicated his/her understanding and acceptance.   Dental advisory given  Plan Discussed with: CRNA  Anesthesia Plan Comments:         Anesthesia Quick Evaluation

## 2012-09-11 NOTE — Anesthesia Postprocedure Evaluation (Signed)
  Anesthesia Post-op Note  Patient: Rebecca Pugh  Procedure(s) Performed: Procedure(s) (LRB): CYSTOSCOPY WITH RETROGRADE PYELOGRAM/URETERAL STENT PLACEMENT (Bilateral)  Patient Location: PACU  Anesthesia Type: General  Level of Consciousness: awake and alert   Airway and Oxygen Therapy: Patient Spontanous Breathing  Post-op Pain: mild  Post-op Assessment: Post-op Vital signs reviewed, Patient's Cardiovascular Status Stable, Respiratory Function Stable, Patent Airway and No signs of Nausea or vomiting  Last Vitals:  Filed Vitals:   09/11/12 1425  BP: 146/71  Pulse: 74  Temp:   Resp: 10    Post-op Vital Signs: stable   Complications: No apparent anesthesia complications

## 2012-09-11 NOTE — H&P (Signed)
History of Present Illness     Rebecca Pugh is seen at Dr Raymond Gurney Magrinat's request.  This is a 62 years old female with metastatic breast cancer who was found on CT chest on 3/17 to have bilateral hydronephrosis secondary to bulky retroperitoneal and pararenal lymphadenopathy.  This is a new finding compared to CT of December 2013.  She is scheduled to receive more chemotherapy next Monday.  She has nocturia  and does not have any flank pain.  Her creatinine on 3/3 was 1.3 with BUN of 11.2.  I have reviewed Dr Magrinat's last office note that reviews in detail Rebecca Larose's history.   Surgical History Problems  1. History of  Ankle Repair Right 2. History of  Breast Surgery Mastectomy Left V45.71 3. History of  Cholecystectomy 4. History of  Tonsillectomy  Current Meds 1. Claritin TABS; Therapy: (Recorded:20Mar2014) to 2. Compazine 10 MG Oral Tablet; Therapy: (Recorded:20Mar2014) to 3. LORazepam 0.5 MG Oral Tablet; Therapy: (Recorded:20Mar2014) to 4. Losartan Potassium TABS; Therapy: (Recorded:20Mar2014) to 5. Potassium TABS; Therapy: (Recorded:20Mar2014) to 6. PROzac CAPS; Therapy: (Recorded:20Mar2014) to  Allergies Medication  1. No Known Drug Allergies  Family History Problems  1. Family history of  Family Health Status Number Of Children 2. Family history of  Father Deceased At Age ____ 3. Family history of  Mother Deceased At Age 71  Social History Problems    Caffeine Use   Marital History - Currently Married   Retired From Work   Tobacco Use 305.1 Denied    History of  Alcohol Use  Review of Systems Genitourinary, constitutional, skin, eye, otolaryngeal, hematologic/lymphatic, cardiovascular, pulmonary, endocrine, musculoskeletal, gastrointestinal, neurological and psychiatric system(s) were reviewed and pertinent findings if present are noted.  Genitourinary: nocturia and incontinence.  Gastrointestinal: nausea, vomiting, heartburn, diarrhea and constipation.   Constitutional: feeling tired (fatigue) and recent weight loss.  ENT: sinus problems.  Hematologic/Lymphatic: swollen glands.  Psychiatric: depression and anxiety.    Vitals Vital Signs [Data Includes: Last 1 Day]  20Mar2014 03:56PM  BMI Calculated: 26.46 BSA Calculated: 1.76 Height: 5 ft 4 in Weight: 155 lb  Blood Pressure: 151 / 82 Heart Rate: 95 Respiration: 18  Physical Exam Constitutional: Well nourished and well developed . No acute distress.  ENT:. The ears and nose are normal in appearance.  Neck: The appearance of the neck is normal and no neck mass is present.  Pulmonary: No respiratory distress and normal respiratory rhythm and effort.  Cardiovascular: Heart rate and rhythm are normal . No peripheral edema.  Abdomen: The abdomen is soft and nontender. No masses are palpated. No CVA tenderness. No hernias are palpable. No hepatosplenomegaly noted.  Genitourinary:  The bladder is non tender and not distended.  Lymphatics: The femoral and inguinal nodes are not enlarged or tender.  Skin: Normal skin turgor, no visible rash and no visible skin lesions.  Neuro/Psych:. Mood and affect are appropriate.    Results/Data Urine [Data Includes: Last 1 Day]   20Mar2014  COLOR STRAW   APPEARANCE CLEAR   SPECIFIC GRAVITY 1.015   pH 5.5   GLUCOSE NEG mg/dL  BILIRUBIN NEG   KETONE NEG mg/dL  BLOOD TRACE   PROTEIN NEG mg/dL  UROBILINOGEN 0.2 mg/dL  NITRITE NEG   LEUKOCYTE ESTERASE MOD   SQUAMOUS EPITHELIAL/HPF MODERATE   WBC 7-10 WBC/hpf  RBC NONE SEEN RBC/hpf  BACTERIA NONE SEEN   CRYSTALS NONE SEEN   CASTS NONE SEEN    Assessment Assessed  1. Hydronephrosis On The Left 591  2. Hydronephrosis On The Right 591  Plan Health Maintenance (V70.0)  1. UA With REFLEX  Done: 20Mar2014 02:58PM Hydronephrosis On The Right (591)  2. BUN & CREATININE  Done: 20Mar2014 3. VENIPUNCTURE  Done: 20Mar2014   Will recheck BUN and creatinine.  Since patient is scheduled to get  aggressive therapy we should consider bilateral JJ stents to preserve renal function. The procedure, risks, benefits were explained to her and her husband.  If we are unable to bypass obstruction she would then need bilateral percutaneous nephrostomies with subsequent antegrade JJ stents.  The risks of the procedure include but are not limited to hemorrhage, infection, stents pain.  They also understand that those stents would need to be exchanged on a regular basis every 3-6 months if ureteral obstruction persists in spite of chemotherapy.  Will discuss this with Dr Darnelle Catalan.   Signatures  CC: Dr Ruthann Cancer  Electronically signed by : Su Grand, M.D.; Sep 03 2012  6:55PM

## 2012-09-11 NOTE — Transfer of Care (Signed)
Immediate Anesthesia Transfer of Care Note  Patient: Rebecca Pugh  Procedure(s) Performed: Procedure(s) (LRB): CYSTOSCOPY WITH RETROGRADE PYELOGRAM/URETERAL STENT PLACEMENT (Bilateral)  Patient Location: PACU  Anesthesia Type: General  Level of Consciousness: awake, sedated, patient cooperative and responds to stimulation  Airway & Oxygen Therapy: Patient Spontanous Breathing and Patient connected to face mask oxygen  Post-op Assessment: Report given to PACU RN, Post -op Vital signs reviewed and stable and Patient moving all extremities  Post vital signs: Reviewed and stable  Complications: No apparent anesthesia complications

## 2012-09-11 NOTE — Anesthesia Procedure Notes (Signed)
Procedure Name: LMA Insertion Date/Time: 09/11/2012 1:22 PM Performed by: Jessica Priest Pre-anesthesia Checklist: Patient identified, Emergency Drugs available, Suction available and Patient being monitored Patient Re-evaluated:Patient Re-evaluated prior to inductionOxygen Delivery Method: Circle System Utilized Preoxygenation: Pre-oxygenation with 100% oxygen Intubation Type: IV induction Ventilation: Mask ventilation without difficulty LMA: LMA inserted LMA Size: 4.0 Number of attempts: 1 Airway Equipment and Method: bite block Placement Confirmation: positive ETCO2 Tube secured with: Tape Dental Injury: Teeth and Oropharynx as per pre-operative assessment

## 2012-09-14 ENCOUNTER — Encounter (HOSPITAL_BASED_OUTPATIENT_CLINIC_OR_DEPARTMENT_OTHER): Payer: Self-pay | Admitting: Urology

## 2012-09-15 ENCOUNTER — Other Ambulatory Visit: Payer: Medicare Other | Admitting: Lab

## 2012-09-15 ENCOUNTER — Ambulatory Visit: Payer: Medicare Other | Admitting: Physician Assistant

## 2012-09-15 ENCOUNTER — Telehealth: Payer: Self-pay | Admitting: *Deleted

## 2012-09-15 NOTE — Telephone Encounter (Signed)
Received call from patient to reschedule her appt. From today.  She had a procedure done and just doesn't feel like making the drive today. Pt. Did agree to come in tomorrow for lab check. Pt. Aware of appt. And time.

## 2012-09-16 ENCOUNTER — Other Ambulatory Visit (HOSPITAL_BASED_OUTPATIENT_CLINIC_OR_DEPARTMENT_OTHER): Payer: Medicare Other | Admitting: Lab

## 2012-09-16 ENCOUNTER — Ambulatory Visit: Payer: Medicare Other | Admitting: Oncology

## 2012-09-16 ENCOUNTER — Telehealth: Payer: Self-pay | Admitting: Oncology

## 2012-09-16 ENCOUNTER — Other Ambulatory Visit: Payer: Medicare Other | Admitting: Lab

## 2012-09-16 ENCOUNTER — Telehealth: Payer: Self-pay | Admitting: *Deleted

## 2012-09-16 ENCOUNTER — Ambulatory Visit: Payer: Medicare Other | Admitting: Lab

## 2012-09-16 ENCOUNTER — Ambulatory Visit (HOSPITAL_BASED_OUTPATIENT_CLINIC_OR_DEPARTMENT_OTHER): Payer: Medicare Other | Admitting: Oncology

## 2012-09-16 ENCOUNTER — Ambulatory Visit: Payer: Medicare Other

## 2012-09-16 VITALS — BP 121/75 | HR 105 | Temp 97.7°F | Resp 20 | Ht 64.0 in | Wt 150.4 lb

## 2012-09-16 DIAGNOSIS — C50219 Malignant neoplasm of upper-inner quadrant of unspecified female breast: Secondary | ICD-10-CM

## 2012-09-16 DIAGNOSIS — N1330 Unspecified hydronephrosis: Secondary | ICD-10-CM

## 2012-09-16 DIAGNOSIS — R300 Dysuria: Secondary | ICD-10-CM

## 2012-09-16 DIAGNOSIS — C50919 Malignant neoplasm of unspecified site of unspecified female breast: Secondary | ICD-10-CM

## 2012-09-16 DIAGNOSIS — E860 Dehydration: Secondary | ICD-10-CM

## 2012-09-16 LAB — CBC WITH DIFFERENTIAL/PLATELET
Basophils Absolute: 0.1 10*3/uL (ref 0.0–0.1)
EOS%: 0.3 % (ref 0.0–7.0)
Eosinophils Absolute: 0.1 10*3/uL (ref 0.0–0.5)
HCT: 33.5 % — ABNORMAL LOW (ref 34.8–46.6)
HGB: 11 g/dL — ABNORMAL LOW (ref 11.6–15.9)
MCH: 28.7 pg (ref 25.1–34.0)
MCV: 87.5 fL (ref 79.5–101.0)
MONO%: 3.8 % (ref 0.0–14.0)
NEUT#: 36.7 10*3/uL — ABNORMAL HIGH (ref 1.5–6.5)
NEUT%: 89.4 % — ABNORMAL HIGH (ref 38.4–76.8)
lymph#: 2.5 10*3/uL (ref 0.9–3.3)

## 2012-09-16 LAB — COMPREHENSIVE METABOLIC PANEL (CC13)
Albumin: 3.3 g/dL — ABNORMAL LOW (ref 3.5–5.0)
Alkaline Phosphatase: 186 U/L — ABNORMAL HIGH (ref 40–150)
BUN: 19.2 mg/dL (ref 7.0–26.0)
CO2: 24 mEq/L (ref 22–29)
Glucose: 142 mg/dl — ABNORMAL HIGH (ref 70–99)
Potassium: 3.2 mEq/L — ABNORMAL LOW (ref 3.5–5.1)
Sodium: 136 mEq/L (ref 136–145)
Total Protein: 6.4 g/dL (ref 6.4–8.3)

## 2012-09-16 LAB — URINALYSIS, MICROSCOPIC - CHCC
Bilirubin (Urine): NEGATIVE
Glucose: NEGATIVE mg/dL
Nitrite: NEGATIVE
Urobilinogen, UR: 0.2 mg/dL (ref 0.2–1)

## 2012-09-16 MED ORDER — SODIUM CHLORIDE 0.9 % IV SOLN
1000.0000 mL | Freq: Once | INTRAVENOUS | Status: AC
  Administered 2012-09-16: 1000 mL via INTRAVENOUS

## 2012-09-16 MED ORDER — HEPARIN SOD (PORK) LOCK FLUSH 100 UNIT/ML IV SOLN
500.0000 [IU] | Freq: Once | INTRAVENOUS | Status: AC
  Filled 2012-09-16: qty 5

## 2012-09-16 MED ORDER — SODIUM CHLORIDE 0.9 % IJ SOLN
10.0000 mL | INTRAMUSCULAR | Status: AC | PRN
  Filled 2012-09-16: qty 10

## 2012-09-16 NOTE — Telephone Encounter (Signed)
lmonvm for pt re appts for 4/4, 4/5 and 4/14. Pt will get schedule when she comes in 4/4. Chemo books closed 4/3 and IVF not able to be scheduled. IVF's will be done 4/4 adn 4/5 and both pt and GM were informed. Also GM would like pt called in for IVF's tomorrow if there is a cancellation. Chemo scheduler aware.

## 2012-09-16 NOTE — Telephone Encounter (Signed)
Call in to triage, patient complains of being very weak, sick and nauseated today. Has only been able to drink some green tea this morning. Had an appt yesterday that she was unable to keep due to later effects of procedure(stent placement). Patient was to see Zollie Scale yesterday, and is requesting to see someone today. She states last time she got this bad they gave her fluids. Spoke with desk nurse, Angie Fava, RN, same nurse that spoke to patient yesterday with cancellation. She will arrange for office visit with MD or midlevel today. 0915---Keisha called back, she has arranged for patient to be seen today at 300pm with Dr Darnelle Catalan. Patient called and informed of appts.

## 2012-09-16 NOTE — Patient Instructions (Addendum)
Dehydration, Adult Dehydration is when you lose more fluids from the body than you take in. Vital organs like the kidneys, brain, and heart cannot function without a proper amount of fluids and salt. Any loss of fluids from the body can cause dehydration.  CAUSES   Vomiting.  Diarrhea.  Excessive sweating.  Excessive urine output.  Fever. SYMPTOMS  Mild dehydration  Thirst.  Dry lips.  Slightly dry mouth. Moderate dehydration  Very dry mouth.  Sunken eyes.  Skin does not bounce back quickly when lightly pinched and released.  Dark urine and decreased urine production.  Decreased tear production.  Headache. Severe dehydration  Very dry mouth.  Extreme thirst.  Rapid, weak pulse (more than 100 beats per minute at rest).  Cold hands and feet.  Not able to sweat in spite of heat and temperature.  Rapid breathing.  Blue lips.  Confusion and lethargy.  Difficulty being awakened.  Minimal urine production.  No tears. DIAGNOSIS  Your caregiver will diagnose dehydration based on your symptoms and your exam. Blood and urine tests will help confirm the diagnosis. The diagnostic evaluation should also identify the cause of dehydration. TREATMENT  Treatment of mild or moderate dehydration can often be done at home by increasing the amount of fluids that you drink. It is best to drink small amounts of fluid more often. Drinking too much at one time can make vomiting worse. Refer to the home care instructions below. Severe dehydration needs to be treated at the hospital where you will probably be given intravenous (IV) fluids that contain water and electrolytes. HOME CARE INSTRUCTIONS   Ask your caregiver about specific rehydration instructions.  Drink enough fluids to keep your urine clear or pale yellow.  Drink small amounts frequently if you have nausea and vomiting.  Eat as you normally do.  Avoid:  Foods or drinks high in sugar.  Carbonated  drinks.  Juice.  Extremely hot or cold fluids.  Drinks with caffeine.  Fatty, greasy foods.  Alcohol.  Tobacco.  Overeating.  Gelatin desserts.  Wash your hands well to avoid spreading bacteria and viruses.  Only take over-the-counter or prescription medicines for pain, discomfort, or fever as directed by your caregiver.  Ask your caregiver if you should continue all prescribed and over-the-counter medicines.  Keep all follow-up appointments with your caregiver. SEEK MEDICAL CARE IF:  You have abdominal pain and it increases or stays in one area (localizes).  You have a rash, stiff neck, or severe headache.  You are irritable, sleepy, or difficult to awaken.  You are weak, dizzy, or extremely thirsty. SEEK IMMEDIATE MEDICAL CARE IF:   You are unable to keep fluids down or you get worse despite treatment.  You have frequent episodes of vomiting or diarrhea.  You have blood or green matter (bile) in your vomit.  You have blood in your stool or your stool looks black and tarry.  You have not urinated in 6 to 8 hours, or you have only urinated a small amount of very dark urine.  You have a fever.  You faint. MAKE SURE YOU:   Understand these instructions.  Will watch your condition.  Will get help right away if you are not doing well or get worse. Document Released: 06/03/2005 Document Revised: 08/26/2011 Document Reviewed: 01/21/2011 Exit[employer] Patient Information 2013 [employer].  

## 2012-09-16 NOTE — Progress Notes (Signed)
ID: Rebecca Pugh   DOB: 07-27-1950  MR#: [id number]  CSN#:[id number]  PCP: Rebecca Bur, MD GYN: SU: Rebecca Pugh OTHER MD: Rebecca Pugh  HISTORY OF PRESENT ILLNESS: The patient and her husband, Rebecca Pugh, moved back to this area recently from [facility] (Rebecca Pugh actually is a native of [place] and incidentally remains a [employer] fan).  As part of moving boxes, [name] felt something under her left arm.  She brought it to Dr. Lovena Pugh attention and although the patient states that she has always had some cyclical swelling of her left axilla during menstruation, Dr. Lalla Pugh set the patient up for evaluation at [facility].  On 12-12-05 the patient had a mammogram and ultrasound there and these showed no mass, distortion or microcalcifications in either breast however, in the left axilla there was a large macrolobulated mass and just above that there was a slightly prominent lymph node which did demonstrate a fatty hilum.  Physically on exam the mass was mobile and nontender and measured approximately 6.5 cm.  The ultrasound showed two small hypoechoic nodules in the left breast 3 cm. from the nipple measuring 7 mm. each and felt to be most likely benign however, the axillary mass, of course, was quite suspicious and biopsy was performed the same day.  This showed ([id number] and [id number]) invasive breast cancer which was strongly ER positive at 76%, moderately PR positive at 25% with a very high proliferation marker at 66%.  HER-2/neu was 1+ and FISH showed no amplification with a ratio of 1.1.    With this information, the patient was referred to Dr. Claud Pugh and on 12-20-05 the patient had bilateral breast MRI's.  In the left breast there was a 5.8 cm. axillary mass and several abnormally enlarged lymph nodes.  There was a 7 mm. left supraclavicular lymph node and the two nodules in the breast that had been noted by ultrasound were also noted by MRI measuring 1.1 and 0.7 cm.  In the right breast there  were no abnormalities noted.  MR guided biopsies of the left breast masses were obtained on 12-27-05 and both showed high grade ductal carcinoma.  There was evidence of lymphovascular invasion and high grade ductal carcinoma in situ as well ([id number]).    Dr. Derrell Pugh discussed the situation with me and we felt it would be useful to have a PET scan prior to definitive surgery.  This was obtained on 12-30-05 and showed the primary axillary mass to have an SUV of 8.5.  The lymph nodes in the left axilla had SUV's in the 2.2 range, small focus of increased activity in the left breast had an SUV of 2.3.  There was also skin thickening with mildly increased diffuse FDG uptake within the skin suggesting an inflammatory breast cancer.  The neck, abdomen and  pelvis were negative.  In the lung windows, there were several scattered small areas of focal nodular density in the upper lobes with compressive atelectasis and this was felt to be benign.  Chest x-ray on 01-09-06 was negative.  With this information, Dr. Derrell Pugh proceeded, after appropriate discussion, to left modified radical mastectomy on 01-15-06 and the insertion of a BARD port at the same time.  The final pathology ([id number]) showed the maximum tumor size to be 5 cm.  Margins were ample.  There was evidence of lymphovascular invasion.  This infiltrating ductal carcinoma was a grade 3 of 3 and 11 out of 14 lymph nodes were involved.  There was evidence of  extracapsular extension.  Her subsequent history is as detailed below.   INTERVAL HISTORY: Rebecca Pugh returns today with a friend (Rebecca Pugh her husband is on an out-of-town business trip) for followup of her breast cancer. Today is day 10 cycle 5 of her docetaxe/ pertuzumab/ trastuzumab treatments  REVIEW OF SYSTEMS: She had her ureteral  stents placed March 28, and initially did well, but then over the last 3-4 days she's had significant abdominal cramps, a little bit of diarrhea ("many"  bowel movements  today, not quite liquid, more granular, but no more than a couple of tablespoons at a time), and urinary frequency and urgency. She has not noted gross hematuria. She has felt dizzy, faint T., nauseated, and vomited x1. She has not had fever. As been no cough, phlegm production, or pleurisy. A detailed review of systems was otherwise noncontributory.  PAST MEDICAL HISTORY: Past Medical History  Diagnosis Date  . Hypertension   . Depression   . Hydronephrosis, bilateral   . Breast cancer metastasized to multiple sites POSITIVE METS IN 2012-----  ONCOLOGIST--  DR Rebecca Pugh--  CURRENTLY RECEIVING CHEMO  EVERY 3 WEEKS (STARTED 06-15-2012)    FIRST DX 01-15-2006, INVASIVE DUCTAL GRADE III/    S/P LEFT MASTECTOMY AND CHEMORADIATION (CHEMO COMPLETE NOV 2007 AND XRT COMPLETE FEB 2008)  remote tonsillectomy, remote cholecystectomy, status post appendectomy, multiple skin biopsies for what the patient said were simple keratoses, and status post port placement.  FAMILY HISTORY: The patient has no information regarding her father. She was brought up by her stepfather. The patient's mother died at the age of 63 with bladder cancer. The patient has one sister who is in good health. There is no history of breast or ovarian cancer in the family to the patient's knowledge.   GYNECOLOGIC HISTORY: She is GX, P2. She was premenopausal at the time of her initial breast cancer diagnosis  SOCIAL HISTORY: She and her husband, Rebecca Pugh, have been married 10 years. 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 She has two children, a daughter, Rebecca Pugh, who lives in [place] and is a Investment banker, corporate, and a son, Rebecca Pugh, also in [place], who works for [employer]. She has two grandchildren and three step-grandchildren from her own two children, Rebecca Pugh and Rebecca Pugh, who are from her first marriage. Rebecca Pugh has one daughter from his first marriage and he  has grandchildren through her. The patient was brought up as a Catholic but currently is not practicing. She and Rebecca Pugh are attending a [employer].      ADVANCED DIRECTIVES:  HEALTH MAINTENANCE: History  Substance Use Topics  . Smoking status: Former Smoker -- 20 years    Quit date: 01/31/1995  . Smokeless tobacco: Never Used  . Alcohol Use: Yes     Comment: occasionl     Colonoscopy:  PAP:  Bone density: August 2012, "Normal"  Lipid panel:  No Known Allergies  Current Outpatient Prescriptions  Medication Sig Dispense Refill  . calcium citrate (CALCITRATE - DOSED IN MG ELEMENTAL CALCIUM) 950 MG tablet Take 1 tablet by mouth daily.       . cholecalciferol (VITAMIN D) 1000 UNITS tablet Take 3,000 Units by mouth daily.       . cholestyramine (QUESTRAN) 4 G packet Take 1 packet by mouth 2 (two) times daily with a meal. Mix with water.      . Desloratadine-Pseudoephedrine (CLARINEX-D 24 HOUR PO) Take 1 capsule by  mouth daily.      Marland Kitchen dexamethasone (DECADRON) 4 MG tablet Take 2 tablets (8 mg total) by mouth 2 (two) times daily with a meal.  60 tablet  1  . FLUoxetine (PROZAC) 40 MG capsule Take 1 capsule (40 mg total) by mouth daily.  30 capsule  3  . lidocaine-prilocaine (EMLA) cream Apply topically as needed.  30 g  3  . loperamide (IMODIUM) 2 MG capsule Take 2 mg by mouth 4 (four) times daily as needed. diarrhea      . LORazepam (ATIVAN) 0.5 MG tablet Take 1 tablet (0.5 mg total) by mouth 2 (two) times daily as needed.  60 tablet  0  . losartan (COZAAR) 50 MG tablet Take 1 tablet (50 mg total) by mouth daily.  30 tablet  6  . metoCLOPramide (REGLAN) 10 MG tablet Take 10 mg by mouth 4 (four) times daily.      . ondansetron (ZOFRAN) 8 MG tablet Take 8 mg by mouth 2 (two) times daily as needed for nausea.      Marland Kitchen oxyCODONE (OXY IR/ROXICODONE) 5 MG immediate release tablet Take 5 mg by mouth every 4 (four) hours as needed.       . potassium chloride (MICRO-K) 10 MEQ CR capsule Take 2  capsules (20 mEq total) by mouth daily.  60 capsule  1  . predniSONE (DELTASONE) 5 MG tablet Take 5 mg by mouth daily. Take x 4 days after discontinuing dexamethasone.-- TO START ON 09-12-2012      . prochlorperazine (COMPAZINE) 10 MG tablet Take 1 tablet (10 mg total) by mouth every 6 (six) hours as needed.  30 tablet  3  . tobramycin-dexamethasone (TOBRADEX) ophthalmic solution Place 1 drop into both eyes 2 (two) times daily.  5 mL  1   No current facility-administered medications for this visit.   Facility-Administered Medications Ordered in Other Visits  Medication Dose Route Frequency Provider Last Rate Last Dose  . cloNIDine (CATAPRES) tablet 0.2 mg  0.2 mg Oral Once [name] Allegra Grana, PA-C         OBJECTIVE: Middle age white woman who appears unwell Filed Vitals:   09/16/12 1428  BP: 121/75  Pulse: 105  Temp: 97.7 F (36.5 C)  Resp: 20    Sclerae unicteric Oropharynx clear Minimal right supraclavicular adenopathy, at most centimeter on the right side, negative on the left Lungs are clear bilaterally, fair excursion Heart regular rate and rhythm Abdomen is soft, nontender, positive bowel sounds.  Neuro: nonfocal, well oriented, anxious affect Musculoskeletal, no focal spinal tenderness  No peripheral edema Breasts: Deferred  LAB RESULTS:  Lab Results  Component Value Date   WBC 41.0* 09/16/2012   NEUTROABS 36.7* 09/16/2012   HGB 11.0* 09/16/2012   HCT 33.5* 09/16/2012   MCV 87.5 09/16/2012   PLT 329 09/16/2012      Chemistry      Component Value Date/Time   NA 137 09/11/2012 1202   NA 140 09/07/2012 1049   NA 142 11/05/2010 1036   K 3.4* 09/11/2012 1202   K 4.0 09/07/2012 1049   K 4.5 11/05/2010 1036   CL 101 09/07/2012 1049   CL 102 02/01/2012 0500   CL 99 11/05/2010 1036   CO2 29 09/07/2012 1049   CO2 25 02/01/2012 0500   CO2 26 11/05/2010 1036   BUN 13.3 09/07/2012 1049   BUN 13 02/01/2012 0500   BUN 14 11/05/2010 1036   CREATININE 1.8* 09/07/2012 1049   CREATININE 0.97  02/01/2012 0500   CREATININE 0.8 11/05/2010 1036      Component Value Date/Time   CALCIUM 9.0 09/07/2012 1049   CALCIUM 8.6 02/01/2012 0500   CALCIUM 8.8 11/05/2010 1036   ALKPHOS 119 09/07/2012 1049   ALKPHOS 88 01/08/2012 0916   ALKPHOS 106* 11/05/2010 1036   AST 13 09/07/2012 1049   AST 24 01/08/2012 0916   AST 27 11/05/2010 1036   ALT 7 09/07/2012 1049   ALT 27 01/08/2012 0916   BILITOT 0.63 09/07/2012 1049   BILITOT 0.9 01/08/2012 0916   BILITOT 0.80 11/05/2010 1036       Lab Results  Component Value Date   LABCA2 107* 08/17/2012    STUDIES: Ct Soft Tissue Neck W Contrast  08/31/2012  *RADIOLOGY REPORT*  Clinical Data: Breast cancer.  Enlarged cervical lymph node.  CT NECK WITH CONTRAST  Technique:  Multidetector CT imaging of the neck was performed with intravenous contrast.  Contrast: OMNIPAQUE IOHEXOL 300 MG/ML  SOLN  Comparison: CT of the neck 05/29/2012  Findings: There is significant decrease in size of left supraclavicular lymph nodes.  The largest node now measures 9 x 11 x 8 mm.  The largest lesion measured 20 mm and maximal axial dimension.  Sub centimeter level II lymph nodes and a left 13 mm jugulodigastric node are stable.  Limited imaging of the brain is unremarkable.  The salivary glands are within normal limits bilaterally.  No significant mucosal or submucosal lesions are evident.  The vocal cords are midline and symmetric.  The bone windows are unremarkable.  IMPRESSION:  1.  Significant response to therapy with interval decrease in size of left supraclavicular adenopathy.   Original Report Authenticated By: Marin Roberts, M.D.    Ct Chest W Contrast  08/31/2012  *RADIOLOGY REPORT*  Clinical Data: 62 year old female with history of metastatic breast cancer.  CT CHEST WITH CONTRAST  Technique:  Multidetector CT imaging of the chest was performed following the standard protocol during bolus administration of intravenous contrast.  Contrast: OMNIPAQUE IOHEXOL 300  MG/ML  SOLN In conjunction with CT(s) of the neck which is(are) reported separately.  Comparison: Chest CT 05/29/2012 and earlier.  Findings: Right chest Port-A-Cath re-identified.  Postoperative changes to the left chest wall and axilla again noted.  Major airways are patent.  Anterior subpleural density in the left lung appears stable and most likely related to radiation therapy. Stable mild peripheral right upper lobe opacity which may reflect scarring (series 5 image 22).  No new or suspicious pulmonary opacity.  At the thoracic inlet on the left and abnormal lower left level IV or superior prevascular lymph node has mildly decreased in size from 1.5 mm short axis to 10 mm short axis (series 2 image 8). However, also on this image is a posterior prevascular/apical lymph node which appears slightly increased to 9 mm short axis (previously 7-8 mm).  Other anterior and middle mediastinal nodal stations remain normal.  Posterior mediastinal/retrocrural lymphadenopathy is stable in the chest.  This is virtually contiguous with bulky retroperitoneal and pararenal lymphadenopathy in the abdomen which is stable or mildly decreased.  As before, this is inseparable from the left kidney.  Furthermore, there is asymmetric decreased enhancement of the left kidney.  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  As before, the left renal vein is not identified and there are capsular venous collaterals of the left kidney.  There is new bilateral  hydronephrosis.  The proximal ureters are only partially visible, but the bulky retroperitoneal disease might affect the ureters.  Stable liver with inferior right lobe hemangioma again suspected.  No abdominal free fluid.  Major mediastinal vascular structures appear be patent.  Stable visualized osseous structures.  Heterogeneous bone mineralization in the some vertebral bodies.  No destructive osseous lesion identified.   IMPRESSION: 1.  Prevascular superior mediastinal metastatic lymph nodes show less response to therapy than those  in the neck (reported separately).  A 9 mm node on series 2 image 8 appears slightly larger. 2.  Stable to slightly decreased posterior mediastinal/retrocrural, and bulky abdominal and retroperitoneal lymphadenopathy, the latter only partially visible). 3.  New bilateral hydronephrosis, suspect bilateral ureteral obstruction due to the bulky retroperitoneal disease. 4.  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 5.  No new metastatic disease identified in the chest.  Study discussed by telephone with Dr. Si Gaul (covering for provider [name] Allyson Sabal) on 08/31/2012 at 1709 hours.   Original Report Authenticated By: Erskine Speed, M.D.       ASSESSMENT: 61 y.o.  [facility] woman with stage IV breast cancer.  1. 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 2. Status post 4 cycles of adjuvant docetaxel, carboplatin and trastuzumab completed November 2007. 3. Postmastectomy radiation completed February 2008. 4. Anastrozole begun February 2008 (the trastuzumab also was continued to complete a year), continued until June 2012.  5. 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  6. On tamoxifen/lapatinib/trastuzumab between June 2012 and October 2012.  7. Ixempra/lapatinib/trastuzumab started October 2012, the Ixempra discontinued in February 2013 due to peripheral neuropathy. 8. Continuing on lapatinib/trastuzumab with the addition of fulvestrant, first injections given on 07/31/2011. Lapatinib given at a dose of 1 g (4 tablets) daily, and trastuzumab given at 8 mg/kg every 4 weeks to coordinate with injection  appointments. 9. Discontinued lapatinib and fulvestrant in December 2013 10. Started q. three-week docetaxel/ trastuzumab/ pertuzumab, first dose given 06/15/2012.  Docetaxel given at a 50% dose reduction beginning with cycle 2 due to poor tolerance and severe neutropenia. Most recent echocardiogram 09/08/2012 11. Bilateral hydronephrosis noted March 2014, bilateral ureteral stenting performed 09/11/2012 under Dr Brunilda Payor  PLAN:  Kiersten is uncomfortable I think chiefly because of the new stents, but also because she is somewhat dehydrated. I think she will benefit from fluids and I am scheduling her for fluids today in the next 2 days. If she does well after today she can skip a day at her discretion. She'll he has one more docetaxel treatment to go, the 14th of this month, I have gone ahead and answered all her relevant orders. After that she will be started on letrozole all and we will continue the double anti-HER-2  treatment every 3 weeks indefinitely, on told her is progression. At the time of progression we will switch to TDM-1 or to carboplatin and Gemzar.  Just to be safe I am sending a C. difficile probe today and also obtaining a urinalysis and urine culture. She knows to call for any problems that may develop before her next visit here and since her husband is out of town if she feels it is unsafe for her to be by herself at home she knowsI will be glad to admit her.  Roshaun Pound C    09/16/2012

## 2012-09-17 ENCOUNTER — Telehealth: Payer: Self-pay | Admitting: *Deleted

## 2012-09-18 ENCOUNTER — Telehealth: Payer: Self-pay | Admitting: *Deleted

## 2012-09-18 ENCOUNTER — Ambulatory Visit: Payer: Medicare Other

## 2012-09-18 LAB — URINE CULTURE

## 2012-09-18 NOTE — Telephone Encounter (Signed)
Pt called wanting to cancel her fluid appt for 4/4 and 09/19/2012, because she was feeling much better. sw the nurse Hale Drone and she stated it was fine to do so.

## 2012-09-19 ENCOUNTER — Ambulatory Visit: Payer: Medicare Other

## 2012-09-21 ENCOUNTER — Ambulatory Visit: Payer: Medicare Other | Admitting: Oncology

## 2012-09-24 ENCOUNTER — Encounter: Payer: Self-pay | Admitting: Oncology

## 2012-09-28 ENCOUNTER — Telehealth: Payer: Self-pay | Admitting: Oncology

## 2012-09-28 ENCOUNTER — Encounter: Payer: Self-pay | Admitting: Physician Assistant

## 2012-09-28 ENCOUNTER — Ambulatory Visit (HOSPITAL_BASED_OUTPATIENT_CLINIC_OR_DEPARTMENT_OTHER): Payer: Medicare Other | Admitting: Physician Assistant

## 2012-09-28 ENCOUNTER — Telehealth: Payer: Self-pay | Admitting: *Deleted

## 2012-09-28 ENCOUNTER — Ambulatory Visit (HOSPITAL_BASED_OUTPATIENT_CLINIC_OR_DEPARTMENT_OTHER): Payer: Medicare Other

## 2012-09-28 ENCOUNTER — Other Ambulatory Visit (HOSPITAL_BASED_OUTPATIENT_CLINIC_OR_DEPARTMENT_OTHER): Payer: Medicare Other | Admitting: Lab

## 2012-09-28 VITALS — BP 92/60 | HR 93 | Temp 98.1°F | Resp 20 | Ht 64.0 in | Wt 153.0 lb

## 2012-09-28 DIAGNOSIS — C50919 Malignant neoplasm of unspecified site of unspecified female breast: Secondary | ICD-10-CM

## 2012-09-28 DIAGNOSIS — Z5111 Encounter for antineoplastic chemotherapy: Secondary | ICD-10-CM

## 2012-09-28 DIAGNOSIS — C773 Secondary and unspecified malignant neoplasm of axilla and upper limb lymph nodes: Secondary | ICD-10-CM

## 2012-09-28 DIAGNOSIS — C50219 Malignant neoplasm of upper-inner quadrant of unspecified female breast: Secondary | ICD-10-CM

## 2012-09-28 DIAGNOSIS — Z170 Estrogen receptor positive status [ER+]: Secondary | ICD-10-CM

## 2012-09-28 DIAGNOSIS — C7970 Secondary malignant neoplasm of unspecified adrenal gland: Secondary | ICD-10-CM

## 2012-09-28 DIAGNOSIS — Z5112 Encounter for antineoplastic immunotherapy: Secondary | ICD-10-CM

## 2012-09-28 DIAGNOSIS — E860 Dehydration: Secondary | ICD-10-CM

## 2012-09-28 DIAGNOSIS — C801 Malignant (primary) neoplasm, unspecified: Secondary | ICD-10-CM

## 2012-09-28 LAB — CBC WITH DIFFERENTIAL/PLATELET
BASO%: 0.8 % (ref 0.0–2.0)
Basophils Absolute: 0.1 10*3/uL (ref 0.0–0.1)
EOS%: 1.7 % (ref 0.0–7.0)
HGB: 9.7 g/dL — ABNORMAL LOW (ref 11.6–15.9)
MCH: 28.6 pg (ref 25.1–34.0)
MCHC: 31.6 g/dL (ref 31.5–36.0)
MCV: 90.6 fL (ref 79.5–101.0)
MONO%: 7.8 % (ref 0.0–14.0)
RBC: 3.39 10*6/uL — ABNORMAL LOW (ref 3.70–5.45)
RDW: 15.8 % — ABNORMAL HIGH (ref 11.2–14.5)
lymph#: 1.2 10*3/uL (ref 0.9–3.3)

## 2012-09-28 MED ORDER — ACETAMINOPHEN 325 MG PO TABS
650.0000 mg | ORAL_TABLET | Freq: Once | ORAL | Status: AC
  Administered 2012-09-28: 650 mg via ORAL

## 2012-09-28 MED ORDER — SODIUM CHLORIDE 0.9 % IV SOLN
Freq: Once | INTRAVENOUS | Status: AC
  Administered 2012-09-28: 12:00:00 via INTRAVENOUS

## 2012-09-28 MED ORDER — DEXAMETHASONE SODIUM PHOSPHATE 10 MG/ML IJ SOLN
10.0000 mg | Freq: Once | INTRAMUSCULAR | Status: AC
  Administered 2012-09-28: 10 mg via INTRAVENOUS

## 2012-09-28 MED ORDER — HEPARIN SOD (PORK) LOCK FLUSH 100 UNIT/ML IV SOLN
500.0000 [IU] | Freq: Once | INTRAVENOUS | Status: AC | PRN
  Administered 2012-09-28: 500 [IU]
  Filled 2012-09-28: qty 5

## 2012-09-28 MED ORDER — ONDANSETRON 16 MG/50ML IVPB (CHCC)
16.0000 mg | Freq: Once | INTRAVENOUS | Status: AC
  Administered 2012-09-28: 16 mg via INTRAVENOUS

## 2012-09-28 MED ORDER — SODIUM CHLORIDE 0.9 % IJ SOLN
10.0000 mL | INTRAMUSCULAR | Status: DC | PRN
  Administered 2012-09-28: 10 mL
  Filled 2012-09-28: qty 10

## 2012-09-28 MED ORDER — DIPHENHYDRAMINE HCL 25 MG PO CAPS
25.0000 mg | ORAL_CAPSULE | Freq: Once | ORAL | Status: AC
  Administered 2012-09-28: 25 mg via ORAL

## 2012-09-28 MED ORDER — TRASTUZUMAB CHEMO INJECTION 440 MG
6.0000 mg/kg | Freq: Once | INTRAVENOUS | Status: AC
  Administered 2012-09-28: 441 mg via INTRAVENOUS
  Filled 2012-09-28: qty 21

## 2012-09-28 MED ORDER — DOCETAXEL CHEMO INJECTION 160 MG/16ML
40.0000 mg/m2 | Freq: Once | INTRAVENOUS | Status: AC
  Administered 2012-09-28: 70 mg via INTRAVENOUS
  Filled 2012-09-28: qty 7

## 2012-09-28 MED ORDER — ONDANSETRON 8 MG/50ML IVPB (CHCC): 8.0000 mg | Freq: Once | INTRAVENOUS | Status: DC

## 2012-09-28 MED ORDER — SODIUM CHLORIDE 0.9 % IV SOLN
420.0000 mg | Freq: Once | INTRAVENOUS | Status: AC
  Administered 2012-09-28: 420 mg via INTRAVENOUS
  Filled 2012-09-28: qty 14

## 2012-09-28 NOTE — Telephone Encounter (Signed)
Per staff message and POF I have scheduled appts.  JMW  

## 2012-09-28 NOTE — Patient Instructions (Addendum)
[facility] Discharge Instructions for Patients Receiving Chemotherapy  Today you received the following chemotherapy agents Herceptin, perjeta and Taxotere  To help prevent nausea and vomiting after your treatment, we encourage you to take your nausea medication. Begin taking your nausea medication as often as prescribed for by [name].    If you develop nausea and vomiting that is not controlled by your nausea medication, call the clinic. If it is after clinic hours your family physician or the after hours number for the clinic or go to the Emergency Department.   BELOW ARE SYMPTOMS THAT SHOULD BE REPORTED IMMEDIATELY:  *FEVER GREATER THAN 100.5 F  *CHILLS WITH OR WITHOUT FEVER  NAUSEA AND VOMITING THAT IS NOT CONTROLLED WITH YOUR NAUSEA MEDICATION  *UNUSUAL SHORTNESS OF BREATH  *UNUSUAL BRUISING OR BLEEDING  TENDERNESS IN MOUTH AND THROAT WITH OR WITHOUT PRESENCE OF ULCERS  *URINARY PROBLEMS  *BOWEL PROBLEMS  UNUSUAL RASH Items with * indicate a potential emergency and should be followed up as soon as possible.  One of the nurses will contact you 24 hours after your treatment. Please let the nurse know about any problems that you may have experienced. Feel free to call the clinic you have any questions or concerns. The clinic phone number is [phone].   I have been informed and understand all the instructions given to me. I know to contact the clinic, my physician, or go to the Emergency Department if any problems should occur. I do not have any questions at this time, but understand that I may call the clinic during office hours   should I have any questions or need assistance in obtaining follow up care.    __________________________________________  _____________  __________ Signature of Patient or Authorized Representative            Date                   Time    __________________________________________ Nurse's Signature

## 2012-09-28 NOTE — Progress Notes (Signed)
ID: Rebecca Pugh   DOB: August 22, 1950  MR#: [id number]  WUJ#:[id number]  PCP: [name], MD GYN: SU: [name] OTHER MD: [name]  HISTORY OF PRESENT ILLNESS: The patient and her husband, Gene, moved back to this area recently from [facility] (Gene actually is a native of [place] and incidentally remains a [employer] fan).  As part of moving boxes, [name] felt something under her left arm.  She brought it to [name] attention and although the patient states that she has always had some cyclical swelling of her left axilla during menstruation, [name] set the patient up for evaluation at [facility].  On 12-12-05 the patient had a mammogram and ultrasound there and these showed no mass, distortion or microcalcifications in either breast however, in the left axilla there was a large macrolobulated mass and just above that there was a slightly prominent lymph node which did demonstrate a fatty hilum.  Physically on exam the mass was mobile and nontender and measured approximately 6.5 cm.  The ultrasound showed two small hypoechoic nodules in the left breast 3 cm. from the nipple measuring 7 mm. each and felt to be most likely benign however, the axillary mass, of course, was quite suspicious and biopsy was performed the same day.  This showed ([id number] and [id number]) invasive breast cancer which was strongly ER positive at 76%, moderately PR positive at 25% with a very high proliferation marker at 66%.  HER-2/neu was 1+ and FISH showed no amplification with a ratio of 1.1.    With this information, the patient was referred to Dr. [name] and on 12-20-05 the patient had bilateral breast MRI's.  In the left breast there was a 5.8 cm. axillary mass and several abnormally enlarged lymph nodes.  There was a 7 mm. left supraclavicular lymph node and the two nodules in the breast that had been noted by ultrasound were also noted by MRI measuring 1.1 and 0.7 cm.  In the right breast there  were no abnormalities noted.  MR guided biopsies of the left breast masses were obtained on 12-27-05 and both showed high grade ductal carcinoma.  There was evidence of lymphovascular invasion and high grade ductal carcinoma in situ as well ([id number]).    [name] discussed the situation with me and we felt it would be useful to have a PET scan prior to definitive surgery.  This was obtained on 12-30-05 and showed the primary axillary mass to have an SUV of 8.5.  The lymph nodes in the left axilla had SUV's in the 2.2 range, small focus of increased activity in the left breast had an SUV of 2.3.  There was also skin thickening with mildly increased diffuse FDG uptake within the skin suggesting an inflammatory breast cancer.  The neck, abdomen and  pelvis were negative.  In the lung windows, there were several scattered small areas of focal nodular density in the upper lobes with compressive atelectasis and this was felt to be benign.  Chest x-ray on 01-09-06 was negative.  With this information, [name] proceeded, after appropriate discussion, to left modified radical mastectomy on 01-15-06 and the insertion of a BARD port at the same time.  The final pathology ([id number]) showed the maximum tumor size to be 5 cm.  Margins were ample.  There was evidence of lymphovascular invasion.  This infiltrating ductal carcinoma was a grade 3 of 3 and 11 out of 14 lymph nodes were involved.  There was evidence of  extracapsular extension.  Her subsequent history is as detailed below.   INTERVAL HISTORY: [name] returns today accompanied by her husband Gene for followup of her metastatic breast cancer. She is due for day 1 cycle 6 of 6 planned doses of every 3 week docetaxel/ pertuzumab/ trastuzumab treatments.   Rebecca Pugh's biggest complaint today is continued pelvic discomfort associated with the recent placement of ureteral stents on March 28. She's had some blood in her urine. She notices some urinary frequency and  hesitancy. A recent urinalysis confirmed blood, but neither the urinalysis or culture showed evidence of urinary tract infection. She currently has no followup appointments scheduled with Dr. Brunilda Payor.   REVIEW OF SYSTEMS: [name] has had no fevers or chills. She's had no skin changes or rashes, and denies any signs of abnormal bleeding other than the hematuria noted above. She does find it difficult to keep herself well hydrated. She's had no significant nausea and no emesis. She's currently having regular bowel movements, and the diarrhea has resolved. She's had no abdominal pain. She denies any cough, increased shortness of breath, or chest pain. She's had no abnormal headaches or dizziness. She has noted a slight increase in the performed neuropathy, primarily in the right foot which has been fractured in the past. The neuropathy in the upper tremor these is stable and has not worsened since her last dose of docetaxel. She's had no peripheral swelling. She denies any new unusual myalgias, arthralgias, or bony pain.  A detailed review of systems is otherwise stable and noncontributory.  PAST MEDICAL HISTORY: Past Medical History  Diagnosis Date  . Hypertension   . Depression   . Hydronephrosis, bilateral   . Breast cancer metastasized to multiple sites POSITIVE METS IN 2012-----  ONCOLOGIST--  DR Darnelle Catalan--  CURRENTLY RECEIVING CHEMO  EVERY 3 WEEKS (STARTED 06-15-2012)    FIRST DX 01-15-2006, INVASIVE DUCTAL GRADE III/    S/P LEFT MASTECTOMY AND CHEMORADIATION (CHEMO COMPLETE NOV 2007 AND XRT COMPLETE FEB 2008)  remote tonsillectomy, remote cholecystectomy, status post appendectomy, multiple skin biopsies for what the patient said were simple keratoses, and status post port placement.  FAMILY HISTORY: The patient has no information regarding her father. She was brought up by her stepfather. The patient's mother died at the age of 60 with bladder cancer. The patient has one sister who is in good  health. There is no history of breast or ovarian cancer in the family to the patient's knowledge.   GYNECOLOGIC HISTORY: She is GX, P2. She was premenopausal at the time of her initial breast cancer diagnosis  SOCIAL HISTORY: She and her husband, Gene, have been married 10 years. 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 She has two children, a daughter, [name], who lives in [place] and is a Investment banker, corporate, and a son, [name], also in [place], who works for [employer]. She has two grandchildren and three step-grandchildren from her own two children, [name] and [name], who are from her first marriage. Gene has one daughter from his first marriage and he has grandchildren through her. The patient was brought up as a Catholic but currently is not practicing. She and Gene are attending a [employer].      ADVANCED DIRECTIVES:  HEALTH MAINTENANCE: History  Substance Use Topics  . Smoking status: Former Smoker -- 20 years    Quit date: 01/31/1995  . Smokeless tobacco: Never Used  . Alcohol  Use: Yes     Comment: occasionl     Colonoscopy:  PAP:  Bone density: August 2012, "Normal"  Lipid panel:  No Known Allergies  Current Outpatient Prescriptions  Medication Sig Dispense Refill  . calcium citrate (CALCITRATE - DOSED IN MG ELEMENTAL CALCIUM) 950 MG tablet Take 1 tablet by mouth daily.       . cholecalciferol (VITAMIN D) 1000 UNITS tablet Take 3,000 Units by mouth daily.       . cholestyramine (QUESTRAN) 4 G packet Take 1 packet by mouth 2 (two) times daily with a meal. Mix with water.      . Desloratadine-Pseudoephedrine (CLARINEX-D 24 HOUR PO) Take 1 capsule by mouth daily.      Marland Kitchen dexamethasone (DECADRON) 4 MG tablet Take 2 tablets (8 mg total) by mouth 2 (two) times daily with a meal.  60 tablet  1  . FLUoxetine (PROZAC) 40 MG capsule Take 1 capsule (40 mg total) by mouth daily.  30 capsule   3  . lidocaine-prilocaine (EMLA) cream Apply topically as needed.  30 g  3  . loperamide (IMODIUM) 2 MG capsule Take 2 mg by mouth 4 (four) times daily as needed. diarrhea      . LORazepam (ATIVAN) 0.5 MG tablet Take 1 tablet (0.5 mg total) by mouth 2 (two) times daily as needed.  60 tablet  0  . losartan (COZAAR) 50 MG tablet Take 1 tablet (50 mg total) by mouth daily.  30 tablet  6  . metoCLOPramide (REGLAN) 10 MG tablet Take 10 mg by mouth 4 (four) times daily.      . ondansetron (ZOFRAN) 8 MG tablet Take 8 mg by mouth 2 (two) times daily as needed for nausea.      Marland Kitchen oxybutynin (DITROPAN) 5 MG tablet       . oxyCODONE (OXY IR/ROXICODONE) 5 MG immediate release tablet Take 5 mg by mouth every 4 (four) hours as needed.       . potassium chloride (MICRO-K) 10 MEQ CR capsule Take 2 capsules (20 mEq total) by mouth daily.  60 capsule  1  . predniSONE (DELTASONE) 5 MG tablet Take 5 mg by mouth daily. Take x 4 days after discontinuing dexamethasone.-- TO START ON 09-12-2012      . prochlorperazine (COMPAZINE) 10 MG tablet Take 1 tablet (10 mg total) by mouth every 6 (six) hours as needed.  30 tablet  3  . tobramycin-dexamethasone (TOBRADEX) ophthalmic solution Place 1 drop into both eyes 2 (two) times daily.  5 mL  1   No current facility-administered medications for this visit.   Facility-Administered Medications Ordered in Other Visits  Medication Dose Route Frequency Provider Last Rate Last Dose  . cloNIDine (CATAPRES) tablet 0.2 mg  0.2 mg Oral Once Paradise Vensel G Lavonna Lampron, PA-C      . heparin lock flush 100 unit/mL  500 Units Intravenous Once Lowella Dell, MD      . heparin lock flush 100 unit/mL  500 Units Intracatheter Once PRN Lowella Dell, MD      . sodium chloride 0.9 % injection 10 mL  10 mL Intravenous PRN Lowella Dell, MD      . sodium chloride 0.9 % injection 10 mL  10 mL Intracatheter PRN Lowella Dell, MD         OBJECTIVE: Middle age white woman who appears tired but is  in no acute distress Filed Vitals:   09/28/12 1002  BP: 92/60  Pulse: 93  Temp: 98.1 F (36.7 C)  Resp: 20  Body mass index is 26.25 kg/(m^2). Filed Weights   09/28/12 1002  Weight: 153 lb (69.4 kg)  ECOG: 1  Sclerae unicteric Oropharynx clear, and visible ulcerations. Palpable right supraclavicular adenopathy, measuring approximately 1 cm in diameter. No cervical adenopathy. No left supraclavicular adenopathy. No axillary adenopathy. Lungs are clear bilaterally, no crackles or wheezes Heart regular rate and rhythm Abdomen is soft, nontender, positive bowel sounds.  Neuro: nonfocal, well oriented, anxious affect Musculoskeletal, no focal spinal tenderness to gentle palpation No peripheral edema Breasts: Deferred  LAB RESULTS:  Lab Results  Component Value Date   WBC 7.6 09/28/2012   NEUTROABS 5.7 09/28/2012   HGB 9.7* 09/28/2012   HCT 30.7* 09/28/2012   MCV 90.6 09/28/2012   PLT 239 09/28/2012      Chemistry      Component Value Date/Time   NA 136 09/16/2012 1410   NA 137 09/11/2012 1202   NA 142 11/05/2010 1036   K 3.2* 09/16/2012 1410   K 3.4* 09/11/2012 1202   K 4.5 11/05/2010 1036   CL 100 09/16/2012 1410   CL 102 02/01/2012 0500   CL 99 11/05/2010 1036   CO2 24 09/16/2012 1410   CO2 25 02/01/2012 0500   CO2 26 11/05/2010 1036   BUN 19.2 09/16/2012 1410   BUN 13 02/01/2012 0500   BUN 14 11/05/2010 1036   CREATININE 1.3* 09/16/2012 1410   CREATININE 0.97 02/01/2012 0500   CREATININE 0.8 11/05/2010 1036      Component Value Date/Time   CALCIUM 8.9 09/16/2012 1410   CALCIUM 8.6 02/01/2012 0500   CALCIUM 8.8 11/05/2010 1036   ALKPHOS 186* 09/16/2012 1410   ALKPHOS 88 01/08/2012 0916   ALKPHOS 106* 11/05/2010 1036   AST 11 09/16/2012 1410   AST 24 01/08/2012 0916   AST 27 11/05/2010 1036   ALT 10 09/16/2012 1410   ALT 27 01/08/2012 0916   BILITOT 0.72 09/16/2012 1410   BILITOT 0.9 01/08/2012 0916   BILITOT 0.80 11/05/2010 1036       Lab Results  Component Value Date   LABCA2 107*  08/17/2012    STUDIES: Ct Soft Tissue Neck W Contrast  08/31/2012  *RADIOLOGY REPORT*  Clinical Data: Breast cancer.  Enlarged cervical lymph node.  CT NECK WITH CONTRAST  Technique:  Multidetector CT imaging of the neck was performed with intravenous contrast.  Contrast: OMNIPAQUE IOHEXOL 300 MG/ML  SOLN  Comparison: CT of the neck 05/29/2012  Findings: There is significant decrease in size of left supraclavicular lymph nodes.  The largest node now measures 9 x 11 x 8 mm.  The largest lesion measured 20 mm and maximal axial dimension.  Sub centimeter level II lymph nodes and a left 13 mm jugulodigastric node are stable.  Limited imaging of the brain is unremarkable.  The salivary glands are within normal limits bilaterally.  No significant mucosal or submucosal lesions are evident.  The vocal cords are midline and symmetric.  The bone windows are unremarkable.  IMPRESSION:  1.  Significant response to therapy with interval decrease in size of left supraclavicular adenopathy.   Original Report Authenticated By: Marin Roberts, M.D.    Ct Chest W Contrast  08/31/2012  *RADIOLOGY REPORT*  Clinical Data: 62 year old female with history of metastatic breast cancer.  CT CHEST WITH CONTRAST  Technique:  Multidetector CT imaging of the chest was performed following the standard protocol during bolus administration of intravenous contrast.  Contrast:  OMNIPAQUE IOHEXOL 300 MG/ML  SOLN In conjunction with CT(s) of the neck which is(are) reported separately.  Comparison: Chest CT 05/29/2012 and earlier.  Findings: Right chest Port-A-Cath re-identified.  Postoperative changes to the left chest wall and axilla again noted.  Major airways are patent.  Anterior subpleural density in the left lung appears stable and most likely related to radiation therapy. Stable mild peripheral right upper lobe opacity which may reflect scarring (series 5 image 22).  No new or suspicious pulmonary opacity.  At the thoracic  inlet on the left and abnormal lower left level IV or superior prevascular lymph node has mildly decreased in size from 1.5 mm short axis to 10 mm short axis (series 2 image 8). However, also on this image is a posterior prevascular/apical lymph node which appears slightly increased to 9 mm short axis (previously 7-8 mm).  Other anterior and middle mediastinal nodal stations remain normal.  Posterior mediastinal/retrocrural lymphadenopathy is stable in the chest.  This is virtually contiguous with bulky retroperitoneal and pararenal lymphadenopathy in the abdomen which is stable or mildly decreased.  As before, this is inseparable from the left kidney.  Furthermore, there is asymmetric decreased enhancement of the left kidney.  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  As before, the left renal vein is not identified and there are capsular venous collaterals of the left kidney.  There is new bilateral hydronephrosis.  The proximal ureters are only partially visible, but the bulky retroperitoneal disease might affect the ureters.  Stable liver with inferior right lobe hemangioma again suspected.  No abdominal free fluid.  Major mediastinal vascular structures appear be patent.  Stable visualized osseous structures.  Heterogeneous bone mineralization in the some vertebral bodies.  No destructive osseous lesion identified.  IMPRESSION: 1.  Prevascular superior mediastinal metastatic lymph nodes show less response to therapy than those  in the neck (reported separately).  A 9 mm node on series 2 image 8 appears slightly larger. 2.  Stable to slightly decreased posterior mediastinal/retrocrural, and bulky abdominal and retroperitoneal lymphadenopathy, the latter only partially visible). 3.  New bilateral hydronephrosis, suspect bilateral ureteral obstruction due to the bulky retroperitoneal disease. 4.  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 5.  No new metastatic disease identified in the chest.  Study discussed by telephone with Dr. Si Gaul (covering for provider Faven Watterson Allyson Sabal) on 08/31/2012 at 1709 hours.   Original Report Authenticated By: Erskine Speed, M.D.       ASSESSMENT: 62 y.o.  [facility] woman with stage IV breast cancer.  1. 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 2. Status post 4 cycles of adjuvant docetaxel, carboplatin and trastuzumab completed November 2007. 3. Postmastectomy radiation completed February 2008. 4. Anastrozole begun February 2008 (the trastuzumab also was continued to complete a year), continued until June 2012.  5. 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  6. On tamoxifen/lapatinib/trastuzumab between June 2012 and October 2012.  7. Ixempra/lapatinib/trastuzumab started October 2012, the Ixempra discontinued in February 2013 due to peripheral neuropathy. 8. Continuing on lapatinib/trastuzumab with the addition of fulvestrant, first injections given on 07/31/2011. Lapatinib given at a dose of 1 g (4 tablets) daily, and trastuzumab given at 8 mg/kg  every 4 weeks to coordinate with injection appointments. 9. Discontinued lapatinib and fulvestrant in December 2013 10. Started q. three-week docetaxel/ trastuzumab/ pertuzumab, first dose given 06/15/2012.  Docetaxel given at a 50% dose reduction beginning with cycle 2 due to poor tolerance and severe neutropenia. Most recent echocardiogram 09/08/2012 11. Bilateral hydronephrosis noted March 2014, bilateral ureteral stenting performed 09/11/2012 under Dr Brunilda Payor  PLAN:  Michaeline will receive her sixth and final dose of docetaxel/trastuzumab/pertuzumab today as scheduled. She will return  tomorrow for Neulasta, and will also receive a liter of normal saline for supportive care with her history of dehydration. She'll have her labs checked again next week, April 21, and we will again give her supportive IV fluids on that day as well. She scheduled to see [name] in 3 weeks when she returns for trastuzumab/pertuzumab alone which she will be receiving indefinitely until there is evidence of progression. He will review her treatment plan, and we'll likely start her on letrozole as well.  At the time of progression we will switch to TDM-1 or to carboplatin and Gemzar.  She's also going to call Dr. Brunilda Payor to followup on her recently placed ureteral stents. These are still causing quite a bit of discomfort. I will note that a recent urinalysis and culture showed evidence of blood, but no evidence of urinary tract infection.  Diem will be due for restaging scans again in June, and these can be scheduled she returns on May 5.  Her next echocardiogram will be due in late June. She voices understanding and agreement with our plan, and will call with any changes or problems prior to her next appointment.   Demetrius Mahler    09/28/2012

## 2012-09-29 ENCOUNTER — Ambulatory Visit (HOSPITAL_BASED_OUTPATIENT_CLINIC_OR_DEPARTMENT_OTHER): Payer: Medicare Other

## 2012-09-29 VITALS — BP 149/83 | HR 88 | Temp 97.9°F | Resp 18

## 2012-09-29 DIAGNOSIS — C50919 Malignant neoplasm of unspecified site of unspecified female breast: Secondary | ICD-10-CM

## 2012-09-29 DIAGNOSIS — C50219 Malignant neoplasm of upper-inner quadrant of unspecified female breast: Secondary | ICD-10-CM

## 2012-09-29 DIAGNOSIS — E860 Dehydration: Secondary | ICD-10-CM

## 2012-09-29 DIAGNOSIS — C773 Secondary and unspecified malignant neoplasm of axilla and upper limb lymph nodes: Secondary | ICD-10-CM

## 2012-09-29 MED ORDER — HEPARIN SOD (PORK) LOCK FLUSH 100 UNIT/ML IV SOLN
500.0000 [IU] | Freq: Once | INTRAVENOUS | Status: AC
  Administered 2012-09-29: 500 [IU] via INTRAVENOUS
  Filled 2012-09-29: qty 5

## 2012-09-29 MED ORDER — SODIUM CHLORIDE 0.9 % IV SOLN
1000.0000 mL | Freq: Once | INTRAVENOUS | Status: AC
  Administered 2012-09-29: 1000 mL via INTRAVENOUS

## 2012-09-29 MED ORDER — PEGFILGRASTIM INJECTION 6 MG/0.6ML
6.0000 mg | Freq: Once | SUBCUTANEOUS | Status: AC
  Administered 2012-09-29: 6 mg via SUBCUTANEOUS
  Filled 2012-09-29: qty 0.6

## 2012-09-29 MED ORDER — SODIUM CHLORIDE 0.9 % IJ SOLN
10.0000 mL | INTRAMUSCULAR | Status: DC | PRN
  Administered 2012-09-29: 10 mL via INTRAVENOUS
  Filled 2012-09-29: qty 10

## 2012-09-29 NOTE — Patient Instructions (Addendum)
Dehydration, Adult Dehydration is when you lose more fluids from the body than you take in. Vital organs like the kidneys, brain, and heart cannot function without a proper amount of fluids and salt. Any loss of fluids from the body can cause dehydration.  CAUSES   Vomiting.  Diarrhea.  Excessive sweating.  Excessive urine output.  Fever. SYMPTOMS  Mild dehydration  Thirst.  Dry lips.  Slightly dry mouth. Moderate dehydration  Very dry mouth.  Sunken eyes.  Skin does not bounce back quickly when lightly pinched and released.  Dark urine and decreased urine production.  Decreased tear production.  Headache. Severe dehydration  Very dry mouth.  Extreme thirst.  Rapid, weak pulse (more than 100 beats per minute at rest).  Cold hands and feet.  Not able to sweat in spite of heat and temperature.  Rapid breathing.  Blue lips.  Confusion and lethargy.  Difficulty being awakened.  Minimal urine production.  No tears. DIAGNOSIS  Your caregiver will diagnose dehydration based on your symptoms and your exam. Blood and urine tests will help confirm the diagnosis. The diagnostic evaluation should also identify the cause of dehydration. TREATMENT  Treatment of mild or moderate dehydration can often be done at home by increasing the amount of fluids that you drink. It is best to drink small amounts of fluid more often. Drinking too much at one time can make vomiting worse. Refer to the home care instructions below. Severe dehydration needs to be treated at the hospital where you will probably be given intravenous (IV) fluids that contain water and electrolytes. HOME CARE INSTRUCTIONS   Ask your caregiver about specific rehydration instructions.  Drink enough fluids to keep your urine clear or pale yellow.  Drink small amounts frequently if you have nausea and vomiting.  Eat as you normally do.  Avoid:  Foods or drinks high in sugar.  Carbonated  drinks.  Juice.  Extremely hot or cold fluids.  Drinks with caffeine.  Fatty, greasy foods.  Alcohol.  Tobacco.  Overeating.  Gelatin desserts.  Wash your hands well to avoid spreading bacteria and viruses.  Only take over-the-counter or prescription medicines for pain, discomfort, or fever as directed by your caregiver.  Ask your caregiver if you should continue all prescribed and over-the-counter medicines.  Keep all follow-up appointments with your caregiver. SEEK MEDICAL CARE IF:  You have abdominal pain and it increases or stays in one area (localizes).  You have a rash, stiff neck, or severe headache.  You are irritable, sleepy, or difficult to awaken.  You are weak, dizzy, or extremely thirsty. SEEK IMMEDIATE MEDICAL CARE IF:   You are unable to keep fluids down or you get worse despite treatment.  You have frequent episodes of vomiting or diarrhea.  You have blood or green matter (bile) in your vomit.  You have blood in your stool or your stool looks black and tarry.  You have not urinated in 6 to 8 hours, or you have only urinated a small amount of very dark urine.  You have a fever.  You faint. MAKE SURE YOU:   Understand these instructions.  Will watch your condition.  Will get help right away if you are not doing well or get worse. Document Released: 06/03/2005 Document Revised: 08/26/2011 Document Reviewed: 01/21/2011 Exit[employer] Patient Information 2013 [employer].  

## 2012-10-05 ENCOUNTER — Ambulatory Visit: Payer: Medicare Other | Admitting: Lab

## 2012-10-05 ENCOUNTER — Ambulatory Visit (HOSPITAL_BASED_OUTPATIENT_CLINIC_OR_DEPARTMENT_OTHER): Payer: Medicare Other

## 2012-10-05 VITALS — BP 116/61 | HR 82 | Temp 97.0°F | Resp 18 | Wt 147.0 lb

## 2012-10-05 DIAGNOSIS — C801 Malignant (primary) neoplasm, unspecified: Secondary | ICD-10-CM

## 2012-10-05 DIAGNOSIS — C773 Secondary and unspecified malignant neoplasm of axilla and upper limb lymph nodes: Secondary | ICD-10-CM

## 2012-10-05 DIAGNOSIS — C50219 Malignant neoplasm of upper-inner quadrant of unspecified female breast: Secondary | ICD-10-CM

## 2012-10-05 DIAGNOSIS — C50919 Malignant neoplasm of unspecified site of unspecified female breast: Secondary | ICD-10-CM

## 2012-10-05 DIAGNOSIS — E860 Dehydration: Secondary | ICD-10-CM

## 2012-10-05 LAB — CBC WITH DIFFERENTIAL/PLATELET
Basophils Absolute: 0.1 10*3/uL (ref 0.0–0.1)
EOS%: 0.9 % (ref 0.0–7.0)
Eosinophils Absolute: 0.2 10*3/uL (ref 0.0–0.5)
HCT: 30.9 % — ABNORMAL LOW (ref 34.8–46.6)
HGB: 10.1 g/dL — ABNORMAL LOW (ref 11.6–15.9)
MONO#: 2 10*3/uL — ABNORMAL HIGH (ref 0.1–0.9)
NEUT#: 19.3 10*3/uL — ABNORMAL HIGH (ref 1.5–6.5)
NEUT%: 81.9 % — ABNORMAL HIGH (ref 38.4–76.8)
RDW: 16.1 % — ABNORMAL HIGH (ref 11.2–14.5)
WBC: 23.6 10*3/uL — ABNORMAL HIGH (ref 3.9–10.3)
lymph#: 1.9 10*3/uL (ref 0.9–3.3)

## 2012-10-05 LAB — COMPREHENSIVE METABOLIC PANEL (CC13)
AST: 11 U/L (ref 5–34)
Albumin: 3.2 g/dL — ABNORMAL LOW (ref 3.5–5.0)
BUN: 20.2 mg/dL (ref 7.0–26.0)
CO2: 23 mEq/L (ref 22–29)
Calcium: 9.1 mg/dL (ref 8.4–10.4)
Chloride: 101 mEq/L (ref 98–107)
Creatinine: 1.4 mg/dL — ABNORMAL HIGH (ref 0.6–1.1)
Glucose: 140 mg/dl — ABNORMAL HIGH (ref 70–99)
Potassium: 3.4 mEq/L — ABNORMAL LOW (ref 3.5–5.1)

## 2012-10-05 MED ORDER — SODIUM CHLORIDE 0.9 % IV SOLN
Freq: Once | INTRAVENOUS | Status: AC
  Administered 2012-10-05: 09:00:00 via INTRAVENOUS

## 2012-10-05 NOTE — Patient Instructions (Signed)
Dehydration, Adult Dehydration is when you lose more fluids from the body than you take in. Vital organs like the kidneys, brain, and heart cannot function without a proper amount of fluids and salt. Any loss of fluids from the body can cause dehydration.  CAUSES   Vomiting.  Diarrhea.  Excessive sweating.  Excessive urine output.  Fever. SYMPTOMS  Mild dehydration  Thirst.  Dry lips.  Slightly dry mouth. Moderate dehydration  Very dry mouth.  Sunken eyes.  Skin does not bounce back quickly when lightly pinched and released.  Dark urine and decreased urine production.  Decreased tear production.  Headache. Severe dehydration  Very dry mouth.  Extreme thirst.  Rapid, weak pulse (more than 100 beats per minute at rest).  Cold hands and feet.  Not able to sweat in spite of heat and temperature.  Rapid breathing.  Blue lips.  Confusion and lethargy.  Difficulty being awakened.  Minimal urine production.  No tears. DIAGNOSIS  Your caregiver will diagnose dehydration based on your symptoms and your exam. Blood and urine tests will help confirm the diagnosis. The diagnostic evaluation should also identify the cause of dehydration. TREATMENT  Treatment of mild or moderate dehydration can often be done at home by increasing the amount of fluids that you drink. It is best to drink small amounts of fluid more often. Drinking too much at one time can make vomiting worse. Refer to the home care instructions below. Severe dehydration needs to be treated at the hospital where you will probably be given intravenous (IV) fluids that contain water and electrolytes. HOME CARE INSTRUCTIONS   Ask your caregiver about specific rehydration instructions.  Drink enough fluids to keep your urine clear or pale yellow.  Drink small amounts frequently if you have nausea and vomiting.  Eat as you normally do.  Avoid:  Foods or drinks high in sugar.  Carbonated  drinks.  Juice.  Extremely hot or cold fluids.  Drinks with caffeine.  Fatty, greasy foods.  Alcohol.  Tobacco.  Overeating.  Gelatin desserts.  Wash your hands well to avoid spreading bacteria and viruses.  Only take over-the-counter or prescription medicines for pain, discomfort, or fever as directed by your caregiver.  Ask your caregiver if you should continue all prescribed and over-the-counter medicines.  Keep all follow-up appointments with your caregiver. SEEK MEDICAL CARE IF:  You have abdominal pain and it increases or stays in one area (localizes).  You have a rash, stiff neck, or severe headache.  You are irritable, sleepy, or difficult to awaken.  You are weak, dizzy, or extremely thirsty. SEEK IMMEDIATE MEDICAL CARE IF:   You are unable to keep fluids down or you get worse despite treatment.  You have frequent episodes of vomiting or diarrhea.  You have blood or green matter (bile) in your vomit.  You have blood in your stool or your stool looks black and tarry.  You have not urinated in 6 to 8 hours, or you have only urinated a small amount of very dark urine.  You have a fever.  You faint. MAKE SURE YOU:   Understand these instructions.  Will watch your condition.  Will get help right away if you are not doing well or get worse. Document Released: 06/03/2005 Document Revised: 08/26/2011 Document Reviewed: 01/21/2011 Exit[employer] Patient Information 2013 [employer].  

## 2012-10-13 ENCOUNTER — Other Ambulatory Visit: Payer: Self-pay | Admitting: Physician Assistant

## 2012-10-19 ENCOUNTER — Ambulatory Visit: Payer: Medicare Other | Admitting: Lab

## 2012-10-19 ENCOUNTER — Ambulatory Visit (HOSPITAL_COMMUNITY)
Admission: RE | Admit: 2012-10-19 | Discharge: 2012-10-19 | Disposition: A | Discharge status: Home / Self Care | Payer: Medicare Other | Source: Ambulatory Visit | Attending: Oncology | Admitting: Oncology

## 2012-10-19 ENCOUNTER — Encounter (HOSPITAL_COMMUNITY)
Admission: RE | Admit: 2012-10-19 | Discharge: 2012-10-19 | Disposition: A | Discharge status: Home / Self Care | Payer: Medicare Other | Source: Ambulatory Visit | Attending: Oncology | Admitting: Oncology

## 2012-10-19 ENCOUNTER — Telehealth: Payer: Self-pay | Admitting: Dietician

## 2012-10-19 ENCOUNTER — Other Ambulatory Visit: Payer: Medicare Other | Admitting: Lab

## 2012-10-19 ENCOUNTER — Ambulatory Visit (HOSPITAL_BASED_OUTPATIENT_CLINIC_OR_DEPARTMENT_OTHER): Payer: Medicare Other

## 2012-10-19 ENCOUNTER — Other Ambulatory Visit (HOSPITAL_BASED_OUTPATIENT_CLINIC_OR_DEPARTMENT_OTHER): Payer: Medicare Other | Admitting: Lab

## 2012-10-19 ENCOUNTER — Other Ambulatory Visit: Payer: Self-pay | Admitting: *Deleted

## 2012-10-19 ENCOUNTER — Ambulatory Visit (HOSPITAL_BASED_OUTPATIENT_CLINIC_OR_DEPARTMENT_OTHER): Payer: Medicare Other | Admitting: Oncology

## 2012-10-19 ENCOUNTER — Telehealth: Payer: Self-pay | Admitting: Oncology

## 2012-10-19 VITALS — BP 112/71 | HR 89 | Temp 97.8°F | Resp 20 | Ht 64.0 in | Wt 149.1 lb

## 2012-10-19 DIAGNOSIS — C50919 Malignant neoplasm of unspecified site of unspecified female breast: Secondary | ICD-10-CM

## 2012-10-19 DIAGNOSIS — N1330 Unspecified hydronephrosis: Secondary | ICD-10-CM

## 2012-10-19 DIAGNOSIS — Z5112 Encounter for antineoplastic immunotherapy: Secondary | ICD-10-CM

## 2012-10-19 DIAGNOSIS — C773 Secondary and unspecified malignant neoplasm of axilla and upper limb lymph nodes: Secondary | ICD-10-CM

## 2012-10-19 DIAGNOSIS — C50912 Malignant neoplasm of unspecified site of left female breast: Secondary | ICD-10-CM

## 2012-10-19 DIAGNOSIS — R110 Nausea: Secondary | ICD-10-CM

## 2012-10-19 DIAGNOSIS — C50219 Malignant neoplasm of upper-inner quadrant of unspecified female breast: Secondary | ICD-10-CM

## 2012-10-19 DIAGNOSIS — C800 Disseminated malignant neoplasm, unspecified: Secondary | ICD-10-CM | POA: Insufficient documentation

## 2012-10-19 LAB — CBC WITH DIFFERENTIAL/PLATELET
BASO%: 0.9 % (ref 0.0–2.0)
Basophils Absolute: 0.1 10e3/uL (ref 0.0–0.1)
EOS%: 1 % (ref 0.0–7.0)
Eosinophils Absolute: 0.1 10e3/uL (ref 0.0–0.5)
HCT: 25.6 % — ABNORMAL LOW (ref 34.8–46.6)
HGB: 8.5 g/dL — ABNORMAL LOW (ref 11.6–15.9)
LYMPH%: 14 % (ref 14.0–49.7)
MCH: 29.1 pg (ref 25.1–34.0)
MCHC: 33.2 g/dL (ref 31.5–36.0)
MCV: 87.7 fL (ref 79.5–101.0)
MONO#: 0.6 10e3/uL (ref 0.1–0.9)
MONO%: 8 % (ref 0.0–14.0)
NEUT#: 5.3 10e3/uL (ref 1.5–6.5)
NEUT%: 76.1 % (ref 38.4–76.8)
Platelets: 260 10e3/uL (ref 145–400)
RBC: 2.92 10e6/uL — ABNORMAL LOW (ref 3.70–5.45)
RDW: 14.9 % — ABNORMAL HIGH (ref 11.2–14.5)
WBC: 7 10e3/uL (ref 3.9–10.3)
lymph#: 1 10e3/uL (ref 0.9–3.3)

## 2012-10-19 LAB — COMPREHENSIVE METABOLIC PANEL (CC13)
ALT: <6 U/L (ref 0–55)
AST: 8 U/L (ref 5–34)
Albumin: 2.6 g/dL — ABNORMAL LOW (ref 3.5–5.0)
BUN: 50.6 mg/dL — ABNORMAL HIGH (ref 7.0–26.0)
CO2: 19 mEq/L — ABNORMAL LOW (ref 22–29)
Calcium: 8.4 mg/dL (ref 8.4–10.4)
Chloride: 102 mEq/L (ref 98–107)
Potassium: 4.2 mEq/L (ref 3.5–5.1)

## 2012-10-19 LAB — PREPARE RBC (CROSSMATCH)

## 2012-10-19 MED ORDER — DOCETAXEL CHEMO INJECTION 160 MG/16ML: 40.0000 mg/m2 | Freq: Once | INTRAVENOUS | Status: DC

## 2012-10-19 MED ORDER — DEXAMETHASONE SODIUM PHOSPHATE 10 MG/ML IJ SOLN: 10.0000 mg | Freq: Once | INTRAMUSCULAR | Status: DC

## 2012-10-19 MED ORDER — ONDANSETRON 16 MG/50ML IVPB (CHCC): 16.0000 mg | Freq: Once | INTRAVENOUS | Status: DC

## 2012-10-19 MED ORDER — SODIUM CHLORIDE 0.9 % IJ SOLN
10.0000 mL | INTRAMUSCULAR | Status: DC | PRN
  Administered 2012-10-19: 10 mL
  Filled 2012-10-19: qty 10

## 2012-10-19 MED ORDER — HEPARIN SOD (PORK) LOCK FLUSH 100 UNIT/ML IV SOLN
500.0000 [IU] | Freq: Once | INTRAVENOUS | Status: AC | PRN
  Administered 2012-10-19: 500 [IU]
  Filled 2012-10-19: qty 5

## 2012-10-19 MED ORDER — SODIUM CHLORIDE 0.9 % IV SOLN
420.0000 mg | Freq: Once | INTRAVENOUS | Status: AC
  Administered 2012-10-19: 420 mg via INTRAVENOUS
  Filled 2012-10-19: qty 14

## 2012-10-19 MED ORDER — SODIUM CHLORIDE 0.9 % IV SOLN
Freq: Once | INTRAVENOUS | Status: AC
  Administered 2012-10-19: 14:00:00 via INTRAVENOUS

## 2012-10-19 MED ORDER — ACETAMINOPHEN 325 MG PO TABS
650.0000 mg | ORAL_TABLET | Freq: Once | ORAL | Status: AC
  Administered 2012-10-19: 650 mg via ORAL

## 2012-10-19 MED ORDER — TRASTUZUMAB CHEMO INJECTION 440 MG
6.0000 mg/kg | Freq: Once | INTRAVENOUS | Status: AC
  Administered 2012-10-19: 441 mg via INTRAVENOUS
  Filled 2012-10-19: qty 21

## 2012-10-19 NOTE — Patient Instructions (Addendum)
[facility] Discharge Instructions for Patients Receiving Chemotherapy  Today you received the following chemotherapy agents Herceptin and Perjeta.  To help prevent nausea and vomiting after your treatment, we encourage you to take your nausea medication.   If you develop nausea and vomiting that is not controlled by your nausea medication, call the clinic. If it is after clinic hours your family physician or the after hours number for the clinic or go to the Emergency Department.   BELOW ARE SYMPTOMS THAT SHOULD BE REPORTED IMMEDIATELY:  *FEVER GREATER THAN 100.5 F  *CHILLS WITH OR WITHOUT FEVER  NAUSEA AND VOMITING THAT IS NOT CONTROLLED WITH YOUR NAUSEA MEDICATION  *UNUSUAL SHORTNESS OF BREATH  *UNUSUAL BRUISING OR BLEEDING  TENDERNESS IN MOUTH AND THROAT WITH OR WITHOUT PRESENCE OF ULCERS  *URINARY PROBLEMS  *BOWEL PROBLEMS  UNUSUAL RASH Items with * indicate a potential emergency and should be followed up as soon as possible.  One of the nurses will contact you 24 hours after your treatment. Please let the nurse know about any problems that you may have experienced. Feel free to call the clinic you have any questions or concerns. The clinic phone number is [phone].   I have been informed and understand all the instructions given to me. I know to contact the clinic, my physician, or go to the Emergency Department if any problems should occur. I do not have any questions at this time, but understand that I may call the clinic during office hours   should I have any questions or need assistance in obtaining follow up care.    __________________________________________  _____________  __________ Signature of Patient or Authorized Representative            Date                   Time    __________________________________________ Nurse's Signature    

## 2012-10-19 NOTE — Progress Notes (Signed)
ID: Rebecca Pugh   DOB: February 21, 1951  MR#: [id number]  WUJ#:[id number]  PCP: Rebecca Bur, MD GYN: SU: Rebecca Pugh OTHER MD: M-H Rebecca Pugh  HISTORY OF PRESENT ILLNESS: The patient and her husband, Rebecca Pugh, moved back to this area recently from [facility] (Rebecca Pugh actually is a native of [place] and incidentally remains a [employer] fan).  As part of moving boxes, [name] felt something under her left arm.  She brought it to Dr. Lovena Pugh attention and although the patient states that she has always had some cyclical swelling of her left axilla during menstruation, Dr. Lalla Pugh set the patient up for evaluation at [facility].  On 12-12-05 the patient had a mammogram and ultrasound there and these showed no mass, distortion or microcalcifications in either breast however, in the left axilla there was a large macrolobulated mass and just above that there was a slightly prominent lymph node which did demonstrate a fatty hilum.  Physically on exam the mass was mobile and nontender and measured approximately 6.5 cm.  The ultrasound showed two small hypoechoic nodules in the left breast 3 cm. from the nipple measuring 7 mm. each and felt to be most likely benign however, the axillary mass, of course, was quite suspicious and biopsy was performed the same day.  This showed ([id number] and [id number]) invasive breast cancer which was strongly ER positive at 76%, moderately PR positive at 25% with a very high proliferation marker at 66%.  HER-2/neu was 1+ and FISH showed no amplification with a ratio of 1.1.    With this information, the patient was referred to Dr. Claud Pugh and on 12-20-05 the patient had bilateral breast MRI's.  In the left breast there was a 5.8 cm. axillary mass and several abnormally enlarged lymph nodes.  There was a 7 mm. left supraclavicular lymph node and the two nodules in the breast that had been noted by ultrasound were also noted by MRI measuring 1.1 and 0.7 cm.  In the right breast there  were no abnormalities noted.  MR guided biopsies of the left breast masses were obtained on 12-27-05 and both showed high grade ductal carcinoma.  There was evidence of lymphovascular invasion and high grade ductal carcinoma in situ as well ([id number]).    Dr. Derrell Rebecca Pugh discussed the situation with me and we felt it would be useful to have a PET scan prior to definitive surgery.  This was obtained on 12-30-05 and showed the primary axillary mass to have an SUV of 8.5.  The lymph nodes in the left axilla had SUV's in the 2.2 range, small focus of increased activity in the left breast had an SUV of 2.3.  There was also skin thickening with mildly increased diffuse FDG uptake within the skin suggesting an inflammatory breast cancer.  The neck, abdomen and  pelvis were negative.  In the lung windows, there were several scattered small areas of focal nodular density in the upper lobes with compressive atelectasis and this was felt to be benign.  Chest x-ray on 01-09-06 was negative.  With this information, Dr. Derrell Rebecca Pugh proceeded, after appropriate discussion, to left modified radical mastectomy on 01-15-06 and the insertion of a BARD port at the same time.  The final pathology ([id number]) showed the maximum tumor size to be 5 cm.  Margins were ample.  There was evidence of lymphovascular invasion.  This infiltrating ductal carcinoma was a grade 3 of 3 and 11 out of 14 lymph nodes were involved.  There was evidence of  extracapsular extension.  Her subsequent history is as detailed below.   INTERVAL HISTORY: Rebecca Pugh returns today accompanied by her husband Rebecca Pugh for followup of her metastatic breast cancer. She completed her sixth and final cycle of docetaxel 3 weeks ago, and is starting the trastuzumab/ pertuzumab treatments today. We are also resuming anti-estrogens.  REVIEW OF SYSTEMS: The biggest problem she is having is nausea with meals. She cannot swallow because she gags and then she vomits. This is really  almost regardless of what kind of food it is, but she generally does better with liquids, particularly soups. They have to put down to her horse, which of course was very sad, particularly since they had to put down there dog within the past year also.  She is "getting used to close "the ureteral stents, and feels the oxybutinin is helping. A detailed review of systems today was otherwise stable.  PAST MEDICAL HISTORY: Past Medical History  Diagnosis Date  . Hypertension   . Depression   . Hydronephrosis, bilateral   . Breast cancer metastasized to multiple sites POSITIVE METS IN 2012-----  ONCOLOGIST--  DR Rebecca Pugh--  CURRENTLY RECEIVING CHEMO  EVERY 3 WEEKS (STARTED 06-15-2012)    FIRST DX 01-15-2006, INVASIVE DUCTAL GRADE III/    S/P LEFT MASTECTOMY AND CHEMORADIATION (CHEMO COMPLETE NOV 2007 AND XRT COMPLETE FEB 2008)  remote tonsillectomy, remote cholecystectomy, status post appendectomy, multiple skin biopsies for what the patient said were simple keratoses, and status post port placement.  FAMILY HISTORY: The patient has no information regarding her father. She was brought up by her stepfather. The patient's mother died at the age of 63 with bladder cancer. The patient has one sister who is in good health. There is no history of breast or ovarian cancer in the family to the patient's knowledge.   GYNECOLOGIC HISTORY: She is GX, P2. She was premenopausal at the time of her initial breast cancer diagnosis  SOCIAL HISTORY: She and her husband, Rebecca Pugh, have been married 10 years. 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 She has two children, a daughter, Rebecca Pugh, who lives in [place] and is a Investment banker, corporate, and a son, Rebecca Pugh, also in [place], who works for [employer]. She has two grandchildren and three step-grandchildren from her own two children, Rebecca Pugh and Rebecca Pugh, who are from her first marriage. Rebecca Pugh  has one daughter from his first marriage and he has grandchildren through her. The patient was brought up as a Catholic but currently is not practicing. She and Rebecca Pugh are attending a [employer].      ADVANCED DIRECTIVES:  HEALTH MAINTENANCE: History  Substance Use Topics  . Smoking status: Former Smoker -- 20 years    Quit date: 01/31/1995  . Smokeless tobacco: Never Used  . Alcohol Use: Yes     Comment: occasionl     Colonoscopy:  PAP:  Bone density: August 2012, "Normal"  Lipid panel:  No Known Allergies  Current Outpatient Prescriptions  Medication Sig Dispense Refill  . calcium citrate (CALCITRATE - DOSED IN MG ELEMENTAL CALCIUM) 950 MG tablet Take 1 tablet by mouth daily.       . cholecalciferol (VITAMIN D) 1000 UNITS tablet Take 3,000 Units by mouth daily.       . cholestyramine (QUESTRAN) 4 G packet Take 1 packet by mouth 2 (two) times daily with a meal. Mix with water.      . Desloratadine-Pseudoephedrine (  CLARINEX-D 24 HOUR PO) Take 1 capsule by mouth daily.      Marland Kitchen dexamethasone (DECADRON) 4 MG tablet Take 2 tablets (8 mg total) by mouth 2 (two) times daily with a meal.  60 tablet  1  . FLUoxetine (PROZAC) 40 MG capsule TAKE ONE CAPSULE BY MOUTH EVERY DAY  30 capsule  0  . lidocaine-prilocaine (EMLA) cream Apply topically as needed.  30 g  3  . loperamide (IMODIUM) 2 MG capsule Take 2 mg by mouth 4 (four) times daily as needed. diarrhea      . LORazepam (ATIVAN) 0.5 MG tablet Take 1 tablet (0.5 mg total) by mouth 2 (two) times daily as needed.  60 tablet  0  . losartan (COZAAR) 50 MG tablet Take 1 tablet (50 mg total) by mouth daily.  30 tablet  6  . metoCLOPramide (REGLAN) 10 MG tablet Take 10 mg by mouth 4 (four) times daily.      . ondansetron (ZOFRAN) 8 MG tablet Take 8 mg by mouth 2 (two) times daily as needed for nausea.      Marland Kitchen oxybutynin (DITROPAN) 5 MG tablet       . oxyCODONE (OXY IR/ROXICODONE) 5 MG immediate release tablet Take 5 mg by mouth every 4  (four) hours as needed.       . potassium chloride (MICRO-K) 10 MEQ CR capsule Take 2 capsules (20 mEq total) by mouth daily.  60 capsule  1  . predniSONE (DELTASONE) 5 MG tablet Take 5 mg by mouth daily. Take x 4 days after discontinuing dexamethasone.-- TO START ON 09-12-2012      . prochlorperazine (COMPAZINE) 10 MG tablet Take 1 tablet (10 mg total) by mouth every 6 (six) hours as needed.  30 tablet  3  . tobramycin-dexamethasone (TOBRADEX) ophthalmic solution Place 1 drop into both eyes 2 (two) times daily.  5 mL  1   No current facility-administered medications for this visit.   Facility-Administered Medications Ordered in Other Visits  Medication Dose Route Frequency Provider Last Rate Last Dose  . dexamethasone (DECADRON) injection 10 mg  10 mg Intravenous Once Lowella Dell, MD      . heparin lock flush 100 unit/mL  500 Units Intravenous Once Lowella Dell, MD      . ondansetron (ZOFRAN) IVPB 16 mg  16 mg Intravenous Once Lowella Dell, MD      . sodium chloride 0.9 % injection 10 mL  10 mL Intravenous PRN Lowella Dell, MD      . sodium chloride 0.9 % injection 10 mL  10 mL Intracatheter PRN Lowella Dell, MD   10 mL at 10/19/12 1628     OBJECTIVE: Middle age white woman in no acute distress Filed Vitals:   10/19/12 1157  BP: 112/71  Pulse: 89  Temp: 97.8 F (36.6 C)  Resp: 20  Body mass index is 25.58 kg/(m^2). Filed Weights   10/19/12 1157  Weight: 149 lb 1.6 oz (67.631 kg)   ECOG: 1  Sclerae unicteric Oropharynx clear Flat right-sided supraclavicular adenopathy, measuring approximately 1 cm in diameter, unchanged. No cervical adenopathy. No left supraclavicular adenopathy. No axillary adenopathy. Lungs are clear bilaterally, no crackles or wheezes Heart regular rate and rhythm Abdomen is soft, nontender, positive bowel sounds.  Neuro: nonfocal, well oriented, anxious affect Musculoskeletal, no focal spinal tenderness to gentle palpation No  peripheral edema Breasts: Deferred  LAB RESULTS:  Lab Results  Component Value Date   WBC 7.0 10/19/2012  NEUTROABS 5.3 10/19/2012   HGB 8.5* 10/19/2012   HCT 25.6* 10/19/2012   MCV 87.7 10/19/2012   PLT 260 10/19/2012      Chemistry      Component Value Date/Time   NA 138 10/19/2012 1109   NA 137 09/11/2012 1202   NA 142 11/05/2010 1036   K 4.2 10/19/2012 1109   K 3.4* 09/11/2012 1202   K 4.5 11/05/2010 1036   CL 102 10/19/2012 1109   CL 102 02/01/2012 0500   CL 99 11/05/2010 1036   CO2 19* 10/19/2012 1109   CO2 25 02/01/2012 0500   CO2 26 11/05/2010 1036   BUN 50.6* 10/19/2012 1109   BUN 13 02/01/2012 0500   BUN 14 11/05/2010 1036   CREATININE 11.7 Repeated and Verified* 10/19/2012 1109   CREATININE 0.97 02/01/2012 0500   CREATININE 0.8 11/05/2010 1036      Component Value Date/Time   CALCIUM 8.4 10/19/2012 1109   CALCIUM 8.6 02/01/2012 0500   CALCIUM 8.8 11/05/2010 1036   ALKPHOS 99 10/19/2012 1109   ALKPHOS 88 01/08/2012 0916   ALKPHOS 106* 11/05/2010 1036   AST 8 10/19/2012 1109   AST 24 01/08/2012 0916   AST 27 11/05/2010 1036   ALT <6 Repeated and Verified 10/19/2012 1109   ALT 27 01/08/2012 0916   BILITOT 0.56 10/19/2012 1109   BILITOT 0.9 01/08/2012 0916   BILITOT 0.80 11/05/2010 1036       Lab Results  Component Value Date   LABCA2 107* 08/17/2012    STUDIES: US Renal  10/19/2012  *RADIOLOGY REPORT*  Clinical Data: Bilateral hydronephrosis, elevated creatinine, metastatic breast cancer  RENAL/URINARY TRACT ULTRASOUND COMPLETE  Comparison:  CT abdomen and pelvis 05/29/2012  Findings:  Right Kidney:  11.2 cm length.  Echogenic focus at renal pelvis question ureteral stent.  Moderate hydronephrosis present.  No gross renal mass or shadowing calcification.  Left Kidney:  10.9 cm length.  Moderate hydronephrosis.  Ureteral stent seen at renal pelvis.  No definite solid mass or shadowing calcification.  Tiny cyst at upper pole 6 x 5 x 6 mm.  Bladder:  Ureteral stents noted.  No definite bladder mass.  Bladder only partially distended.  IMPRESSION: Persistent moderate bilateral hydronephrosis despite what appear to be bilateral ureteral stents.   Original Report Authenticated By: Ulyses Southward, M.D.     ASSESSMENT: 62 y.o.  [facility] woman with stage IV breast cancer.  1. 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 2. Status post 4 cycles of adjuvant docetaxel, carboplatin and trastuzumab completed November 2007. 3. Postmastectomy radiation completed February 2008. 4. Anastrozole begun February 2008 (the trastuzumab also was continued to complete a year), continued until June 2012.  5. 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  6. On tamoxifen/lapatinib/trastuzumab between June 2012 and October 2012.  7. Ixempra/lapatinib/trastuzumab started October 2012, the Ixempra discontinued in February 2013 due to peripheral neuropathy. 8. Continuing on lapatinib/trastuzumab with the addition of fulvestrant, first injections given on 07/31/2011. Lapatinib given at a dose of 1 g (4 tablets) daily, and trastuzumab given at 8 mg/kg every 4 weeks to coordinate with injection appointments. 9. Discontinued lapatinib and fulvestrant in December 2013 10. Started q. three-week docetaxel/ trastuzumab/ pertuzumab, first dose given 06/15/2012.  Docetaxel given at a 50% dose reduction beginning with cycle 2 due to poor tolerance and severe neutropenia. Completed  6 cycles as of 09/28/2012.  11. Trastuzumab/ pertuzumab to be continued indefinitely. Most recent echocardiogram 09/08/2012. Letrozole added 10/19/2012 12. Bilateral hydronephrosis noted March 2014, bilateral ureteral stenting performed 09/11/2012 under Dr Brunilda Payor  PLAN:  Shian is still having anticipatory and associated nausea from her prior chemotherapy. I suggested she try  some Reglan before meals, and if that doesn't work add lorazepam before meals. Hopefully those symptoms will fade as she gets farther away from the docetaxel. Today we added letrozole to her trastuzumab/rituximab. She was on anastrozole previously so she is very aware of the possible toxicities, side effects and complications of aromatase inhibitors.  She will see me again July 16, and a few days before she will have restaging studies. At the time of progression we will switch to TDM-1 or to carboplatin and Gemzar. She will be scheduled for echocardiogram late June and 4 stent revision under Dr. Brunilda Payor also sometime in June. \ ADDENDUM: labwork today showed a markedly elevated creatinine level, with normal calcium, sodium and potassium. We obtained a renal ultrasound which showed a bilateral hydronephrosis again, despite the stent being in place. I called Dr. Brunilda Payor and he is going to address the issue urgently. Meanwhile advised Zacaria to keep a renal type diet, and set her up for repeat lab work in 2 days. She will also receive a blood transfusion at that time.   MAGRINAT,GUSTAV C    10/19/2012

## 2012-10-19 NOTE — Telephone Encounter (Signed)
Brief Outpatient Oncology Nutrition Note  Patient has been identified to be at risk on malnutrition screen.  Wt Readings from Last 10 Encounters:  10/19/12 149 lb 1.6 oz (67.631 kg)  10/05/12 147 lb (66.679 kg)  09/28/12 153 lb (69.4 kg)  09/16/12 150 lb 6.4 oz (68.221 kg)  09/11/12 156 lb (70.761 kg)  09/11/12 156 lb (70.761 kg)  09/08/12 157 lb (71.215 kg)  09/07/12 156 lb 6.4 oz (70.943 kg)  08/24/12 157 lb 8 oz (71.442 kg)  08/17/12 161 lb 4.8 oz (73.165 kg)    Called patient due to weight loss.  Patient is currently not available.  [facility] Diet[facility] contact information for any questions.  Oran Rein, RD, LDN Clinical Inpatient Dietitian Pager:  [phone] Weekend and after hours pager:  [phone]

## 2012-10-20 ENCOUNTER — Telehealth: Payer: Self-pay | Admitting: *Deleted

## 2012-10-20 ENCOUNTER — Encounter (HOSPITAL_BASED_OUTPATIENT_CLINIC_OR_DEPARTMENT_OTHER)
Admission: RE | Disposition: A | Discharge status: Home / Self Care | Payer: Self-pay | Source: Ambulatory Visit | Attending: Urology

## 2012-10-20 ENCOUNTER — Ambulatory Visit (HOSPITAL_COMMUNITY): Payer: Medicare Other

## 2012-10-20 ENCOUNTER — Ambulatory Visit (HOSPITAL_BASED_OUTPATIENT_CLINIC_OR_DEPARTMENT_OTHER)
Admission: RE | Admit: 2012-10-20 | Discharge: 2012-10-20 | Disposition: A | Discharge status: Home / Self Care | Payer: Medicare Other | Source: Ambulatory Visit | Attending: Urology | Admitting: Urology

## 2012-10-20 ENCOUNTER — Encounter (HOSPITAL_BASED_OUTPATIENT_CLINIC_OR_DEPARTMENT_OTHER): Payer: Self-pay | Admitting: Anesthesiology

## 2012-10-20 ENCOUNTER — Encounter (HOSPITAL_BASED_OUTPATIENT_CLINIC_OR_DEPARTMENT_OTHER): Payer: Self-pay | Admitting: *Deleted

## 2012-10-20 ENCOUNTER — Ambulatory Visit: Payer: Medicare Other

## 2012-10-20 ENCOUNTER — Ambulatory Visit (HOSPITAL_BASED_OUTPATIENT_CLINIC_OR_DEPARTMENT_OTHER): Payer: Medicare Other | Admitting: Anesthesiology

## 2012-10-20 ENCOUNTER — Other Ambulatory Visit: Payer: Self-pay | Admitting: Urology

## 2012-10-20 ENCOUNTER — Other Ambulatory Visit (HOSPITAL_COMMUNITY): Payer: Medicare Other

## 2012-10-20 DIAGNOSIS — N1330 Unspecified hydronephrosis: Secondary | ICD-10-CM | POA: Insufficient documentation

## 2012-10-20 DIAGNOSIS — F3289 Other specified depressive episodes: Secondary | ICD-10-CM | POA: Insufficient documentation

## 2012-10-20 DIAGNOSIS — C50919 Malignant neoplasm of unspecified site of unspecified female breast: Secondary | ICD-10-CM | POA: Insufficient documentation

## 2012-10-20 DIAGNOSIS — I10 Essential (primary) hypertension: Secondary | ICD-10-CM | POA: Insufficient documentation

## 2012-10-20 DIAGNOSIS — F329 Major depressive disorder, single episode, unspecified: Secondary | ICD-10-CM | POA: Insufficient documentation

## 2012-10-20 DIAGNOSIS — Z79899 Other long term (current) drug therapy: Secondary | ICD-10-CM | POA: Insufficient documentation

## 2012-10-20 DIAGNOSIS — D649 Anemia, unspecified: Secondary | ICD-10-CM | POA: Insufficient documentation

## 2012-10-20 DIAGNOSIS — F411 Generalized anxiety disorder: Secondary | ICD-10-CM | POA: Insufficient documentation

## 2012-10-20 DIAGNOSIS — Z9010 Acquired absence of unspecified breast and nipple: Secondary | ICD-10-CM | POA: Insufficient documentation

## 2012-10-20 DIAGNOSIS — C801 Malignant (primary) neoplasm, unspecified: Secondary | ICD-10-CM | POA: Insufficient documentation

## 2012-10-20 HISTORY — PX: CYSTOSCOPY WITH STENT PLACEMENT: SHX5790

## 2012-10-20 LAB — POCT I-STAT 4, (NA,K, GLUC, HGB,HCT)
Glucose, Bld: 94 mg/dL (ref 70–99)
HCT: 25 % — ABNORMAL LOW (ref 36.0–46.0)
Hemoglobin: 8.5 g/dL — ABNORMAL LOW (ref 12.0–15.0)

## 2012-10-20 SURGERY — CYSTOSCOPY, WITH STENT INSERTION
Anesthesia: General | Site: Ureter | Wound class: Clean Contaminated

## 2012-10-20 MED ORDER — PROPOFOL 10 MG/ML IV BOLUS
INTRAVENOUS | Status: DC | PRN
  Administered 2012-10-20: 20 mg via INTRAVENOUS
  Administered 2012-10-20 (×2): 50 mg via INTRAVENOUS

## 2012-10-20 MED ORDER — CEFAZOLIN SODIUM 1-5 GM-% IV SOLN
1.0000 g | INTRAVENOUS | Status: AC
  Administered 2012-10-20: 2 g via INTRAVENOUS
  Filled 2012-10-20: qty 50

## 2012-10-20 MED ORDER — IOHEXOL 350 MG/ML SOLN
INTRAVENOUS | Status: DC | PRN
  Administered 2012-10-20: 15 mL

## 2012-10-20 MED ORDER — LACTATED RINGERS IV SOLN
INTRAVENOUS | Status: DC
  Administered 2012-10-20: 11:00:00 via INTRAVENOUS
  Filled 2012-10-20: qty 1000

## 2012-10-20 MED ORDER — FENTANYL CITRATE 0.05 MG/ML IJ SOLN
INTRAMUSCULAR | Status: DC | PRN
  Administered 2012-10-20 (×2): 100 ug via INTRAVENOUS

## 2012-10-20 MED ORDER — FENTANYL CITRATE 0.05 MG/ML IJ SOLN
25.0000 ug | INTRAMUSCULAR | Status: DC | PRN
  Filled 2012-10-20: qty 1

## 2012-10-20 MED ORDER — LIDOCAINE HCL 2 % EX GEL
CUTANEOUS | Status: DC | PRN
  Administered 2012-10-20: 1 via URETHRAL

## 2012-10-20 MED ORDER — LACTATED RINGERS IV SOLN
INTRAVENOUS | Status: DC
  Filled 2012-10-20: qty 1000

## 2012-10-20 MED ORDER — MIDAZOLAM HCL 5 MG/5ML IJ SOLN
INTRAMUSCULAR | Status: DC | PRN
  Administered 2012-10-20: 2 mg via INTRAVENOUS

## 2012-10-20 MED ORDER — ONDANSETRON HCL 4 MG/2ML IJ SOLN
INTRAMUSCULAR | Status: DC | PRN
  Administered 2012-10-20: 4 mg via INTRAVENOUS

## 2012-10-20 MED ORDER — PROPOFOL INFUSION 10 MG/ML OPTIME
INTRAVENOUS | Status: DC | PRN
  Administered 2012-10-20: 50 ug/kg/min via INTRAVENOUS

## 2012-10-20 MED ORDER — LIDOCAINE HCL (CARDIAC) 20 MG/ML IV SOLN
INTRAVENOUS | Status: DC | PRN
  Administered 2012-10-20: 50 mg via INTRAVENOUS

## 2012-10-20 MED ORDER — DEXAMETHASONE SODIUM PHOSPHATE 4 MG/ML IJ SOLN
INTRAMUSCULAR | Status: DC | PRN
  Administered 2012-10-20: 10 mg via INTRAVENOUS

## 2012-10-20 MED ORDER — STERILE WATER FOR IRRIGATION IR SOLN
Status: DC | PRN
  Administered 2012-10-20: 3000 mL

## 2012-10-20 SURGICAL SUPPLY — 16 items
BAG DRAIN URO-CYSTO SKYTR STRL (DRAIN) ×3 IMPLANT
BAG DRN UROCATH (DRAIN) ×2
CANISTER SUCT LVC 12 LTR MEDI- (MISCELLANEOUS) ×1 IMPLANT
CATH INTERMIT  6FR 70CM (CATHETERS) IMPLANT
CATH URET 5FR 28IN CONE TIP (BALLOONS) ×1
CATH URET 5FR 70CM CONE TIP (BALLOONS) IMPLANT
CLOTH BEACON ORANGE TIMEOUT ST (SAFETY) ×2 IMPLANT
DRAPE CAMERA CLOSED 9X96 (DRAPES) ×1 IMPLANT
GLOVE BIO SURGEON STRL SZ7 (GLOVE) ×2 IMPLANT
GUIDEWIRE 0.038 PTFE COATED (WIRE) ×2 IMPLANT
GUIDEWIRE ANG ZIPWIRE 038X150 (WIRE) IMPLANT
GUIDEWIRE STR DUAL SENSOR (WIRE) ×2 IMPLANT
NS IRRIG 500ML POUR BTL (IV SOLUTION) IMPLANT
PACK CYSTOSCOPY (CUSTOM PROCEDURE TRAY) ×2 IMPLANT
STENT POLARIS LOOP 8FR X 22 CM (STENTS) IMPLANT
STENT POLARIS LOOP 8FR X 26 CM (STENTS) ×2 IMPLANT

## 2012-10-20 NOTE — Telephone Encounter (Signed)
Per Diane to make a lab for 10/21/12 @ 8am, and they will draw the labs from the tx area...td

## 2012-10-20 NOTE — H&P (Signed)
Urology Admission H&P  Chief Complaint: Elevated creatinine  History of Present Illness: Patient is a 62 years old female who has bilateral double-J stents inserted on 3/28 for bilateral hydronephrosis secondary to metastatic breast cancer. Her creatinine on April 21 was 1.4. Yesterday her creatinine was 11.7. Renal ultrasound showed moderate bilateral hydronephrosis in spite of of the double-J stents. She is scheduled today for double-J stents exchange.  Past Medical History  Diagnosis Date  . Hypertension   . Depression   . Hydronephrosis, bilateral   . Breast cancer metastasized to multiple sites POSITIVE METS IN 2012-----  ONCOLOGIST--  DR Darnelle Catalan--  CURRENTLY RECEIVING CHEMO  EVERY 3 WEEKS (STARTED 06-15-2012)    FIRST DX 01-15-2006, INVASIVE DUCTAL GRADE III/    S/P LEFT MASTECTOMY AND CHEMORADIATION (CHEMO COMPLETE NOV 2007 AND XRT COMPLETE FEB 2008)   Past Surgical History  Procedure Laterality Date  . Orif ankle fracture  01/31/2012    Procedure: OPEN REDUCTION INTERNAL FIXATION (ORIF) ANKLE FRACTURE;  Surgeon: Loanne Drilling, MD;  Location: [facility];  Service: Orthopedics;  Laterality: Right;  . Port-a-cath placement  12-05-2010  . Mastectomy modified radical Left 01-15-2006    W/ LYMPH NODE DISSECTIONS AND PORT-A-CATH PLACEMENT (REMOVAL 04-06-2007)  . Transthoracic echocardiogram  09-08-2012    MILD LVF/  NORMAL LVSF/ EF 60-65%  . Cholecystectomy  1977    AND APPENDECTOMY  . Tonsillectomy  AS CHILD  . Cystoscopy w/ ureteral stent placement Bilateral 09/11/2012    Procedure: CYSTOSCOPY WITH RETROGRADE PYELOGRAM/URETERAL STENT PLACEMENT;  Surgeon: Lindaann Slough, MD;  Location: [facility];  Service: Urology;  Laterality: Bilateral;  1 HR   . Transthoracic echocardiogram  09-08-2012    mild lvf/ lvsf normal/ ef 60-65%    Home Medications:  Prescriptions prior to admission  Medication Sig Dispense Refill  . calcium citrate (CALCITRATE - DOSED IN MG ELEMENTAL  CALCIUM) 950 MG tablet Take 1 tablet by mouth daily.       . cholecalciferol (VITAMIN D) 1000 UNITS tablet Take 3,000 Units by mouth daily.       . cholestyramine (QUESTRAN) 4 G packet Take 1 packet by mouth 2 (two) times daily with a meal. Mix with water.      . Desloratadine-Pseudoephedrine (CLARINEX-D 24 HOUR PO) Take 1 capsule by mouth daily.      Marland Kitchen dexamethasone (DECADRON) 4 MG tablet Take 2 tablets (8 mg total) by mouth 2 (two) times daily with a meal.  60 tablet  1  . FLUoxetine (PROZAC) 40 MG capsule TAKE ONE CAPSULE BY MOUTH EVERY DAY  30 capsule  0  . loperamide (IMODIUM) 2 MG capsule Take 2 mg by mouth 4 (four) times daily as needed. diarrhea      . LORazepam (ATIVAN) 0.5 MG tablet Take 1 tablet (0.5 mg total) by mouth 2 (two) times daily as needed.  60 tablet  0  . losartan (COZAAR) 50 MG tablet Take 1 tablet (50 mg total) by mouth daily.  30 tablet  6  . metoCLOPramide (REGLAN) 10 MG tablet Take 10 mg by mouth 4 (four) times daily.      Marland Kitchen oxybutynin (DITROPAN) 5 MG tablet       . oxyCODONE (OXY IR/ROXICODONE) 5 MG immediate release tablet Take 5 mg by mouth every 4 (four) hours as needed.       . potassium chloride (MICRO-K) 10 MEQ CR capsule Take 2 capsules (20 mEq total) by mouth daily.  60 capsule  1  .  tobramycin-dexamethasone (TOBRADEX) ophthalmic solution Place 1 drop into both eyes 2 (two) times daily.  5 mL  1  . lidocaine-prilocaine (EMLA) cream Apply topically as needed.  30 g  3  . ondansetron (ZOFRAN) 8 MG tablet Take 8 mg by mouth 2 (two) times daily as needed for nausea.      . predniSONE (DELTASONE) 5 MG tablet Take 5 mg by mouth daily. Take x 4 days after discontinuing dexamethasone.-- TO START ON 09-12-2012      . prochlorperazine (COMPAZINE) 10 MG tablet Take 1 tablet (10 mg total) by mouth every 6 (six) hours as needed.  30 tablet  3   Allergies: No Known Allergies  History reviewed. No pertinent family history. Social History:  reports that she quit smoking  about 17 years ago. She has never used smokeless tobacco. She reports that  drinks alcohol. She reports that she does not use illicit drugs.  Review of Systems  Constitutional: Positive for malaise/fatigue. Negative for fever, chills and diaphoresis.  HENT: Negative for ear pain, congestion and sore throat.   Eyes: Negative for discharge and redness.  Respiratory: Negative for cough and shortness of breath.   Cardiovascular: Negative for chest pain and leg swelling.  Skin: Negative for itching and rash.  Neurological: Negative for headaches.  All other systems reviewed and are negative.    Physical Exam:  Vital signs in last 24 hours: Temp:  [97.5 F (36.4 C)-97.8 F (36.6 C)] 97.5 F (36.4 C) (05/06 1120) Pulse Rate:  [79-89] 79 (05/06 1120) Resp:  [18-20] 18 (05/06 1120) BP: (112-170)/(71-80) 170/80 mmHg (05/06 1120) SpO2:  [97 %] 97 % (05/06 1120) Weight:  [67.631 kg (149 lb 1.6 oz)-70.308 kg (155 lb)] 70.308 kg (155 lb) (05/06 1120) Physical Exam  Constitutional: She is oriented to person, place, and time.  HENT:  Head: Normocephalic.  Eyes: Pupils are equal, round, and reactive to light.  Neck: Normal range of motion.  Cardiovascular: Normal rate and regular rhythm.   Respiratory: No respiratory distress. She has no rales.  GI: She exhibits no distension. There is tenderness. There is no guarding.  Genitourinary: Vagina normal.  Musculoskeletal: Normal range of motion.  Lymphadenopathy:    She has no cervical adenopathy.  Neurological: She is alert and oriented to person, place, and time.  Skin: Skin is warm.  Psychiatric: Her behavior is normal.    Laboratory Data:  Results for orders placed during the hospital encounter of 10/20/12 (from the past 24 hour(s))  POCT I-STAT 4, (NA,K, GLUC, HGB,HCT)     Status: Abnormal   Collection Time    10/20/12 11:13 AM      Result Value Range   Sodium 140  135 - 145 mEq/L   Potassium 4.6  3.5 - 5.1 mEq/L   Glucose, Bld 94   70 - 99 mg/dL   HCT 16.1 (*) 09.6 - 04.5 %   Hemoglobin 8.5 (*) 12.0 - 15.0 g/dL   No results found for this or any previous visit (from the past 240 hour(s)). Creatinine:  Recent Labs  10/19/12 1109  CREATININE 11.7 Repeated and Verified*     Impression/Assessment:  Bilateral hydronephrosis secondary to metastatic breast cancer  Plan:  Cystoscopy.  Bilateral JJ stents exchange  Areal Cochrane-HENRY 10/20/2012, 11:23 AM

## 2012-10-20 NOTE — Anesthesia Preprocedure Evaluation (Addendum)
Anesthesia Evaluation  Patient identified by MRN, date of birth, ID band Patient awake    Reviewed: Allergy & Precautions, H&P , NPO status , Patient's Chart, lab work & pertinent test results  Airway Mallampati: II TM Distance: >3 FB Neck ROM: full    Dental  (+) Caps and Dental Advisory Given All upper front are capped:   Pulmonary neg pulmonary ROS,  breath sounds clear to auscultation  Pulmonary exam normal       Cardiovascular hypertension, Pt. on medications Rhythm:regular Rate:Normal     Neuro/Psych Anxiety Depression negative neurological ROS  negative psych ROS   GI/Hepatic negative GI ROS, Neg liver ROS,   Endo/Other  negative endocrine ROS  Renal/GU hydronephrosis  negative genitourinary   Musculoskeletal   Abdominal   Peds  Hematology  (+) Blood dyscrasia, anemia , Hgb. 8.5   Anesthesia Other Findings Metastatic breast Ca  Reproductive/Obstetrics negative OB ROS                         Anesthesia Physical Anesthesia Plan  ASA: III  Anesthesia Plan: MAC   Post-op Pain Management:    Induction:   Airway Management Planned:   Additional Equipment:   Intra-op Plan:   Post-operative Plan:   Informed Consent: I have reviewed the patients History and Physical, chart, labs and discussed the procedure including the risks, benefits and alternatives for the proposed anesthesia with the patient or authorized representative who has indicated his/her understanding and acceptance.   Dental Advisory Given  Plan Discussed with: CRNA and Surgeon  Anesthesia Plan Comments:        Anesthesia Quick Evaluation

## 2012-10-20 NOTE — Anesthesia Postprocedure Evaluation (Signed)
  Anesthesia Post-op Note  Patient: Rebecca Pugh  Procedure(s) Performed: Procedure(s) (LRB): CYSTOSCOPY, bilateral retrogrades WITH bilateral  STENT RE PLACEMENTs (N/A)  Patient Location: PACU  Anesthesia Type: MAC  Level of Consciousness: awake and alert   Airway and Oxygen Therapy: Patient Spontanous Breathing  Post-op Pain: mild  Post-op Assessment: Post-op Vital signs reviewed, Patient's Cardiovascular Status Stable, Respiratory Function Stable, Patent Airway and No signs of Nausea or vomiting  Last Vitals:  Filed Vitals:   10/20/12 1245  BP: 158/75  Pulse: 74  Temp:   Resp: 9    Post-op Vital Signs: stable   Complications: No apparent anesthesia complications

## 2012-10-20 NOTE — Op Note (Signed)
Rebecca Pugh is a 62 y.o.   10/20/2012  Monitor Anesthesia Care  Pre-op diagnosis: Bilateral hydronephrosis secondary to metastatic breast cancer. Obstructed double-J stents  Postop diagnosis: Same  Procedure done: Cystoscopy, removal of bilateral double-J stents, bilateral retrograde pyelogram, double-J stents exchange  Surgeon: Wendie Simmer. Mariella Blackwelder  Anesthesia: Monitored anesthesia care  Indication: Patient is a 61 years old female who had bilateral double-J stents inserted on 3/28 for bilateral hydronephrosis secondary to metastatic breast cancer. Her creatinine was 1.4 on 4/21. Her creatinine yesterday was 11. Renal ultrasound showed moderate bilateral hydronephrosis in spite of the stents. She is scheduled today for double-J stents exchange.  Procedure: The patient was identified by her wrist band and proper timeout was taken.  Under monitor anesthesia care she was prepped and draped and placed in the dorsolithotomy position. A panendoscope was inserted in the bladder. The loops of the double-J stents are identified in the bladder. There is no tumor in the bladder. The loops of the right double-J stent w were grasped with a grasping forceps and the distal end of the stent was pulled out of the urethra. A sensor wire was passed through the double-J stent and the stent was removed.  Right retrograde pyelogram:  A cone-tip catheter was passed through the cystoscope and the right ureteral orifice. Contrast was then injected through the cone-tip catheter. The distal and mid and proximal ureter appear normal. The renal pelvis and calyces are moderately dilated. The cone-tip catheter was then removed. The sensor wire was left in place.  The loops of the left double-J stent were then grasped with a grasping forceps and the distal end of the stent was pulled out of the urethra. A sensor wire was passed through the double-J stent up to the renal pelvis and the double-J stent was removed.  Left  retrograde pyelogram:  A cone-tip catheter was passed through the cystoscope and the left ureteral orifice. Contrast was injected through the cone-tip catheter. The distal and mid ureter appear normal there appears to be some tortuosity of the proximal ureter suggestive of extrinsic pressure. The renal pelvis and calyces are also moderately dilated. The cone-tip catheter was then removed.  The right sensor wire was then backloaded into the cystoscope and a #8 French-26 Polaris double-J stent was passed over the sensor wire. When the proximal end of the stent appears in the renal pelvis the sensor wire was removed. The loops of the Polaris stent were in the bladder.  The left sensor wire was then backloaded into the cystoscope and a #8 French-26 Polaris double-J stent was passed over the sensor wire. The sensor wire was then removed. The proximal curl of the double-J stent is in the renal pelvis. The loops of the stent are in the bladder.  The bladder was then emptied and the cystoscope removed.  Note that the size of the previous stents was 6-26 [place]. I replaced them with #8-26 [place].  The patient tolerated the procedure well and left the OR in satisfactory condition to postanesthesia care unit.  Cc: Dr. Ruthann Cancer

## 2012-10-20 NOTE — Transfer of Care (Cosign Needed)
Immediate Anesthesia Transfer of Care Note  Patient: Rebecca Pugh  Procedure(s) Performed: Procedure(s) with comments: CYSTOSCOPY, bilateral retrogrades WITH bilateral  STENT RE PLACEMENTs (N/A) - BILATERAL JJ STENT PLACEMENT   Patient Location: PACU  Anesthesia Type:MAC  Level of Consciousness: awake, alert  and oriented  Airway & Oxygen Therapy: Patient Spontanous Breathing and Patient connected to face mask oxygen  Post-op Assessment: Report given to PACU RN  Post vital signs: Reviewed and stable  Complications: No apparent anesthesia complications

## 2012-10-21 ENCOUNTER — Other Ambulatory Visit: Payer: Self-pay | Admitting: Medical Oncology

## 2012-10-21 ENCOUNTER — Other Ambulatory Visit: Payer: Self-pay | Admitting: *Deleted

## 2012-10-21 ENCOUNTER — Ambulatory Visit (HOSPITAL_BASED_OUTPATIENT_CLINIC_OR_DEPARTMENT_OTHER): Payer: Medicare Other

## 2012-10-21 ENCOUNTER — Other Ambulatory Visit (HOSPITAL_BASED_OUTPATIENT_CLINIC_OR_DEPARTMENT_OTHER): Payer: Medicare Other | Admitting: Lab

## 2012-10-21 ENCOUNTER — Encounter (HOSPITAL_BASED_OUTPATIENT_CLINIC_OR_DEPARTMENT_OTHER): Payer: Self-pay | Admitting: Urology

## 2012-10-21 VITALS — BP 179/77 | HR 75 | Temp 98.4°F | Resp 18

## 2012-10-21 DIAGNOSIS — C50919 Malignant neoplasm of unspecified site of unspecified female breast: Secondary | ICD-10-CM

## 2012-10-21 DIAGNOSIS — C800 Disseminated malignant neoplasm, unspecified: Secondary | ICD-10-CM

## 2012-10-21 DIAGNOSIS — C50912 Malignant neoplasm of unspecified site of left female breast: Secondary | ICD-10-CM

## 2012-10-21 DIAGNOSIS — D649 Anemia, unspecified: Secondary | ICD-10-CM

## 2012-10-21 DIAGNOSIS — N1330 Unspecified hydronephrosis: Secondary | ICD-10-CM

## 2012-10-21 LAB — COMPREHENSIVE METABOLIC PANEL (CC13)
ALT: <6 U/L (ref 0–55)
AST: 7 U/L (ref 5–34)
Alkaline Phosphatase: 98 U/L (ref 40–150)
Glucose: 131 mg/dl — ABNORMAL HIGH (ref 70–99)
Sodium: 142 mEq/L (ref 136–145)
Total Bilirubin: 0.33 mg/dL (ref 0.20–1.20)
Total Protein: 6.6 g/dL (ref 6.4–8.3)

## 2012-10-21 LAB — CBC WITH DIFFERENTIAL/PLATELET
BASO%: 0.2 % (ref 0.0–2.0)
EOS%: 0 % (ref 0.0–7.0)
LYMPH%: 7.6 % — ABNORMAL LOW (ref 14.0–49.7)
MCH: 29.2 pg (ref 25.1–34.0)
MCHC: 32.8 g/dL (ref 31.5–36.0)
MCV: 89.1 fL (ref 79.5–101.0)
MONO%: 5.5 % (ref 0.0–14.0)
Platelets: 318 10*3/uL (ref 145–400)
RBC: 2.84 10*6/uL — ABNORMAL LOW (ref 3.70–5.45)
RDW: 14.9 % — ABNORMAL HIGH (ref 11.2–14.5)

## 2012-10-21 MED ORDER — HEPARIN SOD (PORK) LOCK FLUSH 100 UNIT/ML IV SOLN
500.0000 [IU] | Freq: Every day | INTRAVENOUS | Status: AC | PRN
Start: 2012-10-21 — End: 2012-10-21
  Administered 2012-10-21: 500 [IU]
  Filled 2012-10-21: qty 5

## 2012-10-21 MED ORDER — SODIUM CHLORIDE 0.9 % IV SOLN
250.0000 mL | Freq: Once | INTRAVENOUS | Status: AC
  Administered 2012-10-21: 250 mL via INTRAVENOUS

## 2012-10-21 MED ORDER — DIPHENHYDRAMINE HCL 25 MG PO CAPS: 25.0000 mg | ORAL_CAPSULE | Freq: Once | ORAL | Status: DC

## 2012-10-21 MED ORDER — ACETAMINOPHEN 325 MG PO TABS
650.0000 mg | ORAL_TABLET | Freq: Once | ORAL | Status: AC
  Administered 2012-10-21: 650 mg via ORAL

## 2012-10-21 MED ORDER — SODIUM CHLORIDE 0.9 % IJ SOLN
10.0000 mL | INTRAMUSCULAR | Status: AC | PRN
  Administered 2012-10-21: 10 mL
  Filled 2012-10-21: qty 10

## 2012-10-21 NOTE — Patient Instructions (Addendum)
Blood Transfusion   A blood transfusion replaces your blood or some of its parts. Blood is replaced when you have lost blood because of surgery, an accident, or for severe blood conditions like anemia.  You can donate blood to be used on yourself if you have a planned surgery. If you lose blood during that surgery, your own blood can be given back to you.  Any blood given to you is checked to make sure it matches your blood type. Your temperature, blood pressure, and heart rate (vital signs) will be checked often.   GET HELP RIGHT AWAY IF:    You feel sick to your stomach (nauseous) or throw up (vomit).   You have watery poop (diarrhea).   You have shortness of breath or trouble breathing.   You have blood in your pee (urine) or have dark colored pee.   You have chest pain or tightness.   Your eyes or skin turn yellow (jaundice).   You have a temperature by mouth above 102 F (38.9 C), not controlled by medicine.   You start to shake and have chills.   You develop a a red rash (hives) or feel itchy.   You develop lightheadedness or feel confused.   You develop back, joint, or muscle pain.   You do not feel hungry (lost appetite).   You feel tired, restless, or nervous.   You develop belly (abdominal) cramps.  Document Released: 08/30/2008 Document Revised: 08/26/2011 Document Reviewed: 08/30/2008  ExitCare Patient Information 2013 [employer].

## 2012-10-22 LAB — TYPE AND SCREEN
ABO/RH(D): O POS
Antibody Screen: NEGATIVE
Unit division: 0

## 2012-10-23 ENCOUNTER — Telehealth: Payer: Self-pay | Admitting: *Deleted

## 2012-10-23 NOTE — Telephone Encounter (Signed)
Late entry- pt left message late today stating " I need advice- my left arm is numb".   This RN spoke with pt who denied SOB, chest pain, or feeling cold and clammy - which she understood as cardiac concerns -" no I am afraid more about it being neuropathy ". Discomfort is more localized from elbow to shoulder " and my hand feels numb "-  Pt used tramadol last night with little benefit but did have relief with use of ativan.  Per discussion pt will take a prednisone tonight and ativan. She will call the on call MD in am if discomfort is still present.

## 2012-11-04 ENCOUNTER — Ambulatory Visit (HOSPITAL_BASED_OUTPATIENT_CLINIC_OR_DEPARTMENT_OTHER): Payer: Medicare Other | Admitting: Physician Assistant

## 2012-11-04 ENCOUNTER — Other Ambulatory Visit (HOSPITAL_BASED_OUTPATIENT_CLINIC_OR_DEPARTMENT_OTHER): Payer: Medicare Other | Admitting: Lab

## 2012-11-04 ENCOUNTER — Other Ambulatory Visit: Payer: Self-pay | Admitting: Physician Assistant

## 2012-11-04 ENCOUNTER — Encounter: Payer: Self-pay | Admitting: Physician Assistant

## 2012-11-04 ENCOUNTER — Telehealth: Payer: Self-pay | Admitting: *Deleted

## 2012-11-04 VITALS — BP 103/72 | HR 97 | Temp 98.2°F | Resp 18 | Ht 64.0 in | Wt 138.6 lb

## 2012-11-04 DIAGNOSIS — C7970 Secondary malignant neoplasm of unspecified adrenal gland: Secondary | ICD-10-CM

## 2012-11-04 DIAGNOSIS — R944 Abnormal results of kidney function studies: Secondary | ICD-10-CM

## 2012-11-04 DIAGNOSIS — F419 Anxiety disorder, unspecified: Secondary | ICD-10-CM

## 2012-11-04 DIAGNOSIS — C50912 Malignant neoplasm of unspecified site of left female breast: Secondary | ICD-10-CM

## 2012-11-04 DIAGNOSIS — B029 Zoster without complications: Secondary | ICD-10-CM

## 2012-11-04 DIAGNOSIS — N1330 Unspecified hydronephrosis: Secondary | ICD-10-CM

## 2012-11-04 DIAGNOSIS — C50219 Malignant neoplasm of upper-inner quadrant of unspecified female breast: Secondary | ICD-10-CM

## 2012-11-04 LAB — CBC WITH DIFFERENTIAL/PLATELET
Eosinophils Absolute: 0.3 10*3/uL (ref 0.0–0.5)
LYMPH%: 18 % (ref 14.0–49.7)
MCH: 29.2 pg (ref 25.1–34.0)
MCV: 88 fL (ref 79.5–101.0)
MONO%: 10.4 % (ref 0.0–14.0)
NEUT#: 3.7 10*3/uL (ref 1.5–6.5)
Platelets: 225 10*3/uL (ref 145–400)
RBC: 3.89 10*6/uL (ref 3.70–5.45)

## 2012-11-04 LAB — COMPREHENSIVE METABOLIC PANEL (CC13)
Alkaline Phosphatase: 119 U/L (ref 40–150)
BUN: 13.5 mg/dL (ref 7.0–26.0)
Glucose: 148 mg/dl — ABNORMAL HIGH (ref 70–99)
Sodium: 138 mEq/L (ref 136–145)
Total Bilirubin: 0.7 mg/dL (ref 0.20–1.20)

## 2012-11-04 MED ORDER — LIDOCAINE 5 % EX OINT: TOPICAL_OINTMENT | Freq: Two times a day (BID) | CUTANEOUS | Status: DC | PRN

## 2012-11-04 MED ORDER — GABAPENTIN 300 MG PO CAPS: 300.0000 mg | ORAL_CAPSULE | Freq: Every day | ORAL | Status: AC

## 2012-11-04 MED ORDER — VALACYCLOVIR HCL 1 G PO TABS: 1000.0000 mg | ORAL_TABLET | Freq: Two times a day (BID) | ORAL | Status: DC

## 2012-11-04 NOTE — Telephone Encounter (Signed)
Received call from patient stating she not sure what's going on but maybe she has a yeast infection.  Patient denies any itching or discharge.  She states that the right side of her labia has welts or bumps that are hard and is painful to touch. She states if feels like when she had shingles before.  She states she also has a right groin lymph node swollen. Patient does not have primary care MD nor GYN MD.  Patient will see Zollie Scale, PA today with labs.  Pt. Aware of time.

## 2012-11-04 NOTE — Progress Notes (Signed)
ID: Rebecca Pugh   DOB: [birth year]  MR#: [id number]  CSN#:[id number]  PCP: [name], MD GYN: SU: [name] OTHER MD: Rebecca Pugh  HISTORY OF PRESENT ILLNESS: The patient and her husband, Gene, moved back to this area recently from [facility] (Gene actually is a native of [place] and incidentally remains a [employer] fan).  As part of moving boxes, Rebecca Pugh felt something under her left arm.  She brought it to [name] attention and although the patient states that she has always had some cyclical swelling of her left axilla during menstruation, [name] set the patient up for evaluation at [facility].  On 12-12-05 the patient had a mammogram and ultrasound there and these showed no mass, distortion or microcalcifications in either breast however, in the left axilla there was a large macrolobulated mass and just above that there was a slightly prominent lymph node which did demonstrate a fatty hilum.  Physically on exam the mass was mobile and nontender and measured approximately 6.5 cm.  The ultrasound showed two small hypoechoic nodules in the left breast 3 cm. from the nipple measuring 7 mm. each and felt to be most likely benign however, the axillary mass, of course, was quite suspicious and biopsy was performed the same day.  This showed ([id number] and [id number]) invasive breast cancer which was strongly ER positive at 76%, moderately PR positive at 25% with a very high proliferation marker at 66%.  HER-2/neu was 1+ and FISH showed no amplification with a ratio of 1.1.    With this information, the patient was referred to Dr. [name] and on 12-20-05 the patient had bilateral breast MRI's.  In the left breast there was a 5.8 cm. axillary mass and several abnormally enlarged lymph nodes.  There was a 7 mm. left supraclavicular lymph node and the two nodules in the breast that had been noted by ultrasound were also noted by MRI measuring 1.1 and 0.7 cm.  In the right breast there  were no abnormalities noted.  MR guided biopsies of the left breast masses were obtained on 12-27-05 and both showed high grade ductal carcinoma.  There was evidence of lymphovascular invasion and high grade ductal carcinoma in situ as well ([id number]).    [name] discussed the situation with me and we felt it would be useful to have a PET scan prior to definitive surgery.  This was obtained on 12-30-05 and showed the primary axillary mass to have an SUV of 8.5.  The lymph nodes in the left axilla had SUV's in the 2.2 range, small focus of increased activity in the left breast had an SUV of 2.3.  There was also skin thickening with mildly increased diffuse FDG uptake within the skin suggesting an inflammatory breast cancer.  The neck, abdomen and  pelvis were negative.  In the lung windows, there were several scattered small areas of focal nodular density in the upper lobes with compressive atelectasis and this was felt to be benign.  Chest x-ray on 01-09-06 was negative.  With this information, [name] proceeded, after appropriate discussion, to left modified radical mastectomy on 01-15-06 and the insertion of a BARD port at the same time.  The final pathology ([id number]) showed the maximum tumor size to be 5 cm.  Margins were ample.  There was evidence of lymphovascular invasion.  This infiltrating ductal carcinoma was a grade 3 of 3 and 11 out of 14 lymph nodes were involved.  There was evidence of  extracapsular extension.  Her subsequent history is as detailed below.   INTERVAL HISTORY: Rebecca Pugh returns today in between scheduled visits for evaluation of a rash on her right labia. The area began to be uncomfortable on Sunday, and she noticed the rash on Monday, May 19. She tells me it as a sharp, burning pain "like shingles". She has no history of genital herpes. She denies any additional rashes elsewhere.  REVIEW OF SYSTEMS: Rebecca Pugh has had no fevers or chills. Her energy level is very low. Her  appetite is reduced, and she has occasional nausea. She's having regular bowel movements. She does have some local discomfort with urination. She denies any obvious hematuria. She's had no new cough. She does have some shortness of breath with exertion.  A detailed review of systems is otherwise stable and noncontributory today.   PAST MEDICAL HISTORY: Past Medical History  Diagnosis Date  . Hypertension   . Depression   . Hydronephrosis, bilateral   . Breast cancer metastasized to multiple sites POSITIVE METS IN 2012-----  ONCOLOGIST--  DR Darnelle Catalan--  CURRENTLY RECEIVING CHEMO  EVERY 3 WEEKS (STARTED 06-15-2012)    FIRST DX 01-15-2006, INVASIVE DUCTAL GRADE III/    S/P LEFT MASTECTOMY AND CHEMORADIATION (CHEMO COMPLETE NOV 2007 AND XRT COMPLETE FEB 2008)  remote tonsillectomy, remote cholecystectomy, status post appendectomy, multiple skin biopsies for what the patient said were simple keratoses, and status post port placement.  FAMILY HISTORY: The patient has no information regarding her father. She was brought up by her stepfather. The patient's mother died at the age of 14 with bladder cancer. The patient has one sister who is in good health. There is no history of breast or ovarian cancer in the family to the patient's knowledge.   GYNECOLOGIC HISTORY: She is GX, P2. She was premenopausal at the time of her initial breast cancer diagnosis  SOCIAL HISTORY: She and her husband, Gene, have been married 10 years. 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 She has two children, a daughter, [name], who lives in [place] and is a Investment banker, corporate, and a son, [name], also in [place], who works for [employer]. She has two grandchildren and three step-grandchildren from her own two children, [name] and [name], who are from her first marriage. Gene has one daughter from his first marriage and he has grandchildren  through her. The patient was brought up as a Catholic but currently is not practicing. She and Gene are attending a [employer].      ADVANCED DIRECTIVES:  HEALTH MAINTENANCE: History  Substance Use Topics  . Smoking status: Former Smoker -- 20 years    Quit date: 01/31/1995  . Smokeless tobacco: Never Used  . Alcohol Use: Yes     Comment: occasionl     Colonoscopy:  PAP:  Bone density: August 2012, "Normal"  Lipid panel:  No Known Allergies  Current Outpatient Prescriptions  Medication Sig Dispense Refill  . calcium citrate (CALCITRATE - DOSED IN MG ELEMENTAL CALCIUM) 950 MG tablet Take 1 tablet by mouth daily.       . cholecalciferol (VITAMIN D) 1000 UNITS tablet Take 3,000 Units by mouth daily.       . cholestyramine (QUESTRAN) 4 G packet Take 1 packet by mouth 2 (two) times daily with a meal. Mix with water.      . Desloratadine-Pseudoephedrine (CLARINEX-D 24 HOUR PO) Take 1 capsule by mouth daily.      Marland Kitchen  dexamethasone (DECADRON) 4 MG tablet Take 2 tablets (8 mg total) by mouth 2 (two) times daily with a meal.  60 tablet  1  . FLUoxetine (PROZAC) 40 MG capsule TAKE ONE CAPSULE BY MOUTH EVERY DAY  30 capsule  0  . lidocaine-prilocaine (EMLA) cream Apply topically as needed.  30 g  3  . loperamide (IMODIUM) 2 MG capsule Take 2 mg by mouth 4 (four) times daily as needed. diarrhea      . LORazepam (ATIVAN) 0.5 MG tablet Take 1 tablet (0.5 mg total) by mouth 2 (two) times daily as needed.  60 tablet  0  . losartan (COZAAR) 50 MG tablet Take 1 tablet (50 mg total) by mouth daily.  30 tablet  6  . metoCLOPramide (REGLAN) 10 MG tablet Take 10 mg by mouth 4 (four) times daily.      . ondansetron (ZOFRAN) 8 MG tablet Take 8 mg by mouth 2 (two) times daily as needed for nausea.      Marland Kitchen oxybutynin (DITROPAN) 5 MG tablet       . oxyCODONE (OXY IR/ROXICODONE) 5 MG immediate release tablet Take 5 mg by mouth every 4 (four) hours as needed.       . potassium chloride (MICRO-K) 10 MEQ CR  capsule Take 2 capsules (20 mEq total) by mouth daily.  60 capsule  1  . predniSONE (DELTASONE) 5 MG tablet Take 5 mg by mouth daily. Take x 4 days after discontinuing dexamethasone.-- TO START ON 09-12-2012      . prochlorperazine (COMPAZINE) 10 MG tablet Take 1 tablet (10 mg total) by mouth every 6 (six) hours as needed.  30 tablet  3  . tobramycin-dexamethasone (TOBRADEX) ophthalmic solution Place 1 drop into both eyes 2 (two) times daily.  5 mL  1  . gabapentin (NEURONTIN) 300 MG capsule Take 1 capsule (300 mg total) by mouth at bedtime.  30 capsule  5  . lidocaine (XYLOCAINE) 5 % ointment Apply topically 2 (two) times daily as needed.  30 g  1  . valACYclovir (VALTREX) 1000 MG tablet Take 1 tablet (1,000 mg total) by mouth 2 (two) times daily.  28 tablet  0   No current facility-administered medications for this visit.   Facility-Administered Medications Ordered in Other Visits  Medication Dose Route Frequency Provider Last Rate Last Dose  . heparin lock flush 100 unit/mL  500 Units Intravenous Once Lowella Dell, MD      . sodium chloride 0.9 % injection 10 mL  10 mL Intravenous PRN Lowella Dell, MD         OBJECTIVE: Middle age white woman who appears tired and very uncomfortable Filed Vitals:   11/04/12 1210  BP: 103/72  Pulse: 97  Temp: 98.2 F (36.8 C)  Resp: 18  Body mass index is 23.78 kg/(m^2). Filed Weights   11/04/12 1210  Weight: 138 lb 9.6 oz (62.869 kg)   ECOG: 2  Exam reveals a unilateral vesicular rash covering the right labia, and extending down into the right groin area. There is no evidence of rash or skin changes whatsoever on the left side. There is one enlarged lymph node in the right inguinal area as well, measuring approximately 1 cm. It is tender to touch.   LAB RESULTS:  Lab Results  Component Value Date   WBC 5.6 11/04/2012   NEUTROABS 3.7 11/04/2012   HGB 11.4* 11/04/2012   HCT 34.3* 11/04/2012   MCV 88.0 11/04/2012   PLT 225  11/04/2012       Chemistry      Component Value Date/Time   NA 138 11/04/2012 1145   NA 140 10/20/2012 1113   NA 142 11/05/2010 1036   K 3.6 11/04/2012 1145   K 4.6 10/20/2012 1113   K 4.5 11/05/2010 1036   CL 99 11/04/2012 1145   CL 102 02/01/2012 0500   CL 99 11/05/2010 1036   CO2 28 11/04/2012 1145   CO2 25 02/01/2012 0500   CO2 26 11/05/2010 1036   BUN 13.5 11/04/2012 1145   BUN 13 02/01/2012 0500   BUN 14 11/05/2010 1036   CREATININE 1.4* 11/04/2012 1145   CREATININE 0.97 02/01/2012 0500   CREATININE 0.8 11/05/2010 1036      Component Value Date/Time   CALCIUM 9.1 11/04/2012 1145   CALCIUM 8.6 02/01/2012 0500   CALCIUM 8.8 11/05/2010 1036   ALKPHOS 119 11/04/2012 1145   ALKPHOS 88 01/08/2012 0916   ALKPHOS 106* 11/05/2010 1036   AST 13 11/04/2012 1145   AST 24 01/08/2012 0916   AST 27 11/05/2010 1036   ALT 9 11/04/2012 1145   ALT 27 01/08/2012 0916   BILITOT 0.70 11/04/2012 1145   BILITOT 0.9 01/08/2012 0916   BILITOT 0.80 11/05/2010 1036       Lab Results  Component Value Date   LABCA2 107* 08/17/2012    STUDIES: US Renal  10/19/2012  *RADIOLOGY REPORT*  Clinical Data: Bilateral hydronephrosis, elevated creatinine, metastatic breast cancer  RENAL/URINARY TRACT ULTRASOUND COMPLETE  Comparison:  CT abdomen and pelvis 05/29/2012  Findings:  Right Kidney:  11.2 cm length.  Echogenic focus at renal pelvis question ureteral stent.  Moderate hydronephrosis present.  No gross renal mass or shadowing calcification.  Left Kidney:  10.9 cm length.  Moderate hydronephrosis.  Ureteral stent seen at renal pelvis.  No definite solid mass or shadowing calcification.  Tiny cyst at upper pole 6 x 5 x 6 mm.  Bladder:  Ureteral stents noted.  No definite bladder mass. Bladder only partially distended.  IMPRESSION: Persistent moderate bilateral hydronephrosis despite what appear to be bilateral ureteral stents.   Original Report Authenticated By: Ulyses Southward, M.D.     ASSESSMENT: 62 y.o.  [facility] woman with stage IV breast  cancer.  1. 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 2. Status post 4 cycles of adjuvant docetaxel, carboplatin and trastuzumab completed November 2007. 3. Postmastectomy radiation completed February 2008. 4. Anastrozole begun February 2008 (the trastuzumab also was continued to complete a year), continued until June 2012.  5. 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  6. On tamoxifen/lapatinib/trastuzumab between June 2012 and October 2012.  7. Ixempra/lapatinib/trastuzumab started October 2012, the Ixempra discontinued in February 2013 due to peripheral neuropathy. 8. Continuing on lapatinib/trastuzumab with the addition of fulvestrant, first injections given on 07/31/2011. Lapatinib given at a dose of 1 g (4 tablets) daily, and trastuzumab given at 8 mg/kg every 4 weeks to coordinate with injection appointments. 9. Discontinued lapatinib and fulvestrant in December 2013 10. Started q. three-week docetaxel/ trastuzumab/ pertuzumab, first dose given 06/15/2012.  Docetaxel given at a 50% dose reduction beginning with cycle 2 due to poor tolerance and severe neutropenia. Completed 6 cycles as of 09/28/2012.  11. Trastuzumab/ pertuzumab to be continued indefinitely. Most recent echocardiogram 09/08/2012. Letrozole added 10/19/2012 12. Bilateral hydronephrosis noted March 2014, bilateral  ureteral stenting performed 09/11/2012 under Dr Brunilda Payor 13. Herpes Zoster  PLAN:  Malaia has unfortunately developed shingles on the right labia.  I am starting her on Valtrex, and have adjusted the dose due to her elevated creatinine. With a creatinine today of 1.4, her creatinine clearance is calculated 39.6. Accordingly, I am giving her a reduced dose of Valtrex, 1000 mg twice daily rather than the usual 3 times a day dose.  I've  given her prescription for topical lidocaine ointment for pain control, and I'm starting her on gabapentin, currently at 300 mg nightly. She also has tramadol on hand at home, and will let us know if this does not control her discomfort.  Sharmeka is scheduled to return next week on may 27th for her next dose of  Trastuzumab/pertuzumab, and I have asked her to stop by the office and let us know how she is doing. Of course she knows to call us prior that time if she has any problems.  Zailynn is scheduled to see [name] again on June 16, and prior to that time will have restaging studies. I am also scheduling her for her next echocardiogram which we do in late June. According to his previous notes, at the time of progression we will switch to TDM-1 or to carboplatin and Gemzar.     Jamarr Treinen    11/04/2012

## 2012-11-05 ENCOUNTER — Telehealth: Payer: Self-pay | Admitting: Oncology

## 2012-11-05 NOTE — Telephone Encounter (Signed)
, °

## 2012-11-10 ENCOUNTER — Ambulatory Visit (HOSPITAL_BASED_OUTPATIENT_CLINIC_OR_DEPARTMENT_OTHER): Payer: Medicare Other

## 2012-11-10 ENCOUNTER — Other Ambulatory Visit (HOSPITAL_BASED_OUTPATIENT_CLINIC_OR_DEPARTMENT_OTHER): Payer: Medicare Other | Admitting: Lab

## 2012-11-10 VITALS — BP 130/77 | HR 73 | Temp 97.1°F

## 2012-11-10 DIAGNOSIS — C50919 Malignant neoplasm of unspecified site of unspecified female breast: Secondary | ICD-10-CM

## 2012-11-10 DIAGNOSIS — Z5112 Encounter for antineoplastic immunotherapy: Secondary | ICD-10-CM

## 2012-11-10 DIAGNOSIS — C773 Secondary and unspecified malignant neoplasm of axilla and upper limb lymph nodes: Secondary | ICD-10-CM

## 2012-11-10 DIAGNOSIS — C50219 Malignant neoplasm of upper-inner quadrant of unspecified female breast: Secondary | ICD-10-CM

## 2012-11-10 LAB — CBC WITH DIFFERENTIAL/PLATELET
Basophils Absolute: 0 10*3/uL (ref 0.0–0.1)
Eosinophils Absolute: 0.4 10*3/uL (ref 0.0–0.5)
HCT: 32.5 % — ABNORMAL LOW (ref 34.8–46.6)
HGB: 10.7 g/dL — ABNORMAL LOW (ref 11.6–15.9)
MCH: 29.1 pg (ref 25.1–34.0)
MONO#: 0.5 10*3/uL (ref 0.1–0.9)
NEUT#: 3.8 10*3/uL (ref 1.5–6.5)
NEUT%: 66.2 % (ref 38.4–76.8)
RDW: 13.5 % (ref 11.2–14.5)
WBC: 5.8 10*3/uL (ref 3.9–10.3)
lymph#: 1.1 10*3/uL (ref 0.9–3.3)

## 2012-11-10 LAB — COMPREHENSIVE METABOLIC PANEL (CC13)
Albumin: 3 g/dL — ABNORMAL LOW (ref 3.5–5.0)
BUN: 14 mg/dL (ref 7.0–26.0)
CO2: 26 mEq/L (ref 22–29)
Calcium: 8.9 mg/dL (ref 8.4–10.4)
Chloride: 102 mEq/L (ref 98–107)
Glucose: 93 mg/dl (ref 70–99)
Potassium: 3.6 mEq/L (ref 3.5–5.1)
Sodium: 138 mEq/L (ref 136–145)
Total Protein: 6.2 g/dL — ABNORMAL LOW (ref 6.4–8.3)

## 2012-11-10 MED ORDER — ONDANSETRON 16 MG/50ML IVPB (CHCC)
16.0000 mg | Freq: Once | INTRAVENOUS | Status: AC
  Administered 2012-11-10: 16 mg via INTRAVENOUS

## 2012-11-10 MED ORDER — DEXAMETHASONE SODIUM PHOSPHATE 10 MG/ML IJ SOLN
10.0000 mg | Freq: Once | INTRAMUSCULAR | Status: AC
  Administered 2012-11-10: 10 mg via INTRAVENOUS

## 2012-11-10 MED ORDER — SODIUM CHLORIDE 0.9 % IJ SOLN
10.0000 mL | INTRAMUSCULAR | Status: DC | PRN
  Administered 2012-11-10: 10 mL
  Filled 2012-11-10: qty 10

## 2012-11-10 MED ORDER — HEPARIN SOD (PORK) LOCK FLUSH 100 UNIT/ML IV SOLN
500.0000 [IU] | Freq: Once | INTRAVENOUS | Status: AC | PRN
  Administered 2012-11-10: 500 [IU]
  Filled 2012-11-10: qty 5

## 2012-11-10 MED ORDER — TRASTUZUMAB CHEMO INJECTION 440 MG: 6.0000 mg/kg | Freq: Once | INTRAVENOUS | Status: DC

## 2012-11-10 MED ORDER — SODIUM CHLORIDE 0.9 % IV SOLN
Freq: Once | INTRAVENOUS | Status: AC
  Administered 2012-11-10: 12:00:00 via INTRAVENOUS

## 2012-11-10 MED ORDER — ACETAMINOPHEN 325 MG PO TABS
650.0000 mg | ORAL_TABLET | Freq: Once | ORAL | Status: AC
  Administered 2012-11-10: 650 mg via ORAL

## 2012-11-10 MED ORDER — TRASTUZUMAB CHEMO INJECTION 440 MG
6.0000 mg/kg | Freq: Once | INTRAVENOUS | Status: AC
  Administered 2012-11-10: 378 mg via INTRAVENOUS
  Filled 2012-11-10: qty 18

## 2012-11-10 MED ORDER — SODIUM CHLORIDE 0.9 % IV SOLN
420.0000 mg | Freq: Once | INTRAVENOUS | Status: AC
  Administered 2012-11-10: 420 mg via INTRAVENOUS
  Filled 2012-11-10: qty 14

## 2012-11-10 NOTE — Progress Notes (Signed)
Patient reports her shingles are improving.  She has spoken with Mena Pauls Rn as instructed by Zollie Scale PA before coming back to the infusion room. Patient kept for 30 min for observation after completion of perjeta.

## 2012-11-10 NOTE — Patient Instructions (Addendum)
[facility] Discharge Instructions for Patients Receiving Chemotherapy  Today you received the following chemotherapy agents herceptin, perjeta.  To help prevent nausea and vomiting after your treatment, we encourage you to take your nausea medication reglan. Begin taking it at  tonight and take it as often as prescribed.   If you develop nausea and vomiting that is not controlled by your nausea medication, call the clinic. If it is after clinic hours your family physician or the after hours number for the clinic or go to the Emergency Department.   BELOW ARE SYMPTOMS THAT SHOULD BE REPORTED IMMEDIATELY:  *FEVER GREATER THAN 100.5 F  *CHILLS WITH OR WITHOUT FEVER  NAUSEA AND VOMITING THAT IS NOT CONTROLLED WITH YOUR NAUSEA MEDICATION  *UNUSUAL SHORTNESS OF BREATH  *UNUSUAL BRUISING OR BLEEDING  TENDERNESS IN MOUTH AND THROAT WITH OR WITHOUT PRESENCE OF ULCERS  *URINARY PROBLEMS  *BOWEL PROBLEMS  UNUSUAL RASH Items with * indicate a potential emergency and should be followed up as soon as possible.  . Feel free to call the clinic you have any questions or concerns. The clinic phone number is [phone].   I have been informed and understand all the instructions given to me. I know to contact the clinic, my physician, or go to the Emergency Department if any problems should occur. I do not have any questions at this time, but understand that I may call the clinic during office hours   should I have any questions or need assistance in obtaining follow up care.    __________________________________________  _____________  __________ Signature of Patient or Authorized Representative            Date                   Time    __________________________________________ Nurse's Signature

## 2012-11-11 ENCOUNTER — Telehealth: Payer: Self-pay | Admitting: *Deleted

## 2012-11-11 ENCOUNTER — Ambulatory Visit: Payer: Medicare Other

## 2012-11-11 NOTE — Telephone Encounter (Signed)
Pt called stating that she will be out of town and wanted her PET, and CT scans r/s. She wants it completed the 1st week of June. i called cs to r/s her. gv her an appt for 11/17/12 and informed her that she needed to arrive @ 10:45am...td

## 2012-11-15 ENCOUNTER — Other Ambulatory Visit: Payer: Self-pay | Admitting: Physician Assistant

## 2012-11-17 ENCOUNTER — Ambulatory Visit (HOSPITAL_COMMUNITY)
Admission: RE | Admit: 2012-11-17 | Discharge: 2012-11-17 | Disposition: A | Discharge status: Home / Self Care | Payer: Medicare Other | Source: Ambulatory Visit | Attending: Physician Assistant | Admitting: Physician Assistant

## 2012-11-17 ENCOUNTER — Encounter (HOSPITAL_COMMUNITY)
Admission: RE | Admit: 2012-11-17 | Discharge: 2012-11-17 | Disposition: A | Discharge status: Home / Self Care | Payer: Medicare Other | Source: Ambulatory Visit | Attending: Physician Assistant | Admitting: Physician Assistant

## 2012-11-17 DIAGNOSIS — R599 Enlarged lymph nodes, unspecified: Secondary | ICD-10-CM | POA: Insufficient documentation

## 2012-11-17 DIAGNOSIS — C787 Secondary malignant neoplasm of liver and intrahepatic bile duct: Secondary | ICD-10-CM | POA: Insufficient documentation

## 2012-11-17 DIAGNOSIS — C50919 Malignant neoplasm of unspecified site of unspecified female breast: Secondary | ICD-10-CM | POA: Insufficient documentation

## 2012-11-17 DIAGNOSIS — C800 Disseminated malignant neoplasm, unspecified: Secondary | ICD-10-CM | POA: Insufficient documentation

## 2012-11-17 DIAGNOSIS — C50912 Malignant neoplasm of unspecified site of left female breast: Secondary | ICD-10-CM

## 2012-11-17 DIAGNOSIS — N1330 Unspecified hydronephrosis: Secondary | ICD-10-CM | POA: Insufficient documentation

## 2012-11-17 DIAGNOSIS — D1803 Hemangioma of intra-abdominal structures: Secondary | ICD-10-CM | POA: Insufficient documentation

## 2012-11-17 DIAGNOSIS — E279 Disorder of adrenal gland, unspecified: Secondary | ICD-10-CM | POA: Insufficient documentation

## 2012-11-17 LAB — GLUCOSE, CAPILLARY: Glucose-Capillary: 104 mg/dL — ABNORMAL HIGH (ref 70–99)

## 2012-11-17 MED ORDER — IOHEXOL 300 MG/ML  SOLN
125.0000 mL | Freq: Once | INTRAMUSCULAR | Status: AC | PRN
  Administered 2012-11-17: 125 mL via INTRAVENOUS

## 2012-11-17 MED ORDER — FLUDEOXYGLUCOSE F - 18 (FDG) INJECTION
17.8000 | Freq: Once | INTRAVENOUS | Status: AC | PRN
  Administered 2012-11-17: 17.8 via INTRAVENOUS

## 2012-11-24 ENCOUNTER — Telehealth: Payer: Self-pay | Admitting: Dietician

## 2012-11-24 NOTE — Progress Notes (Signed)
ID: [name]   DOB: [birth year]  MR#: [id number]  CSN#:[id number]  PCP: [name], MD GYN: SU: [name] OTHER MD: [name]  HISTORY OF PRESENT ILLNESS: The patient and her husband, Gene, moved back to this area recently from [facility] (Gene actually is a native of [place] and incidentally remains a [employer] fan).  As part of moving boxes, Ms. Overbaugh felt something under her left arm.  She brought it to [name] attention and although the patient states that she has always had some cyclical swelling of her left axilla during menstruation, [name] set the patient up for evaluation at [facility].  On 12-12-05 the patient had a mammogram and ultrasound there and these showed no mass, distortion or microcalcifications in either breast however, in the left axilla there was a large macrolobulated mass and just above that there was a slightly prominent lymph node which did demonstrate a fatty hilum.  Physically on exam the mass was mobile and nontender and measured approximately 6.5 cm.  The ultrasound showed two small hypoechoic nodules in the left breast 3 cm. from the nipple measuring 7 mm. each and felt to be most likely benign however, the axillary mass, of course, was quite suspicious and biopsy was performed the same day.  This showed ([id number] and [id number]) invasive breast cancer which was strongly ER positive at 76%, moderately PR positive at 25% with a very high proliferation marker at 66%.  HER-2/neu was 1+ and FISH showed no amplification with a ratio of 1.1.    With this information, the patient was referred to Dr. [name] and on 12-20-05 the patient had bilateral breast MRI's.  In the left breast there was a 5.8 cm. axillary mass and several abnormally enlarged lymph nodes.  There was a 7 mm. left supraclavicular lymph node and the two nodules in the breast that had been noted by ultrasound were also noted by MRI measuring 1.1 and 0.7 cm.  In the right breast there  were no abnormalities noted.  MR guided biopsies of the left breast masses were obtained on 12-27-05 and both showed high grade ductal carcinoma.  There was evidence of lymphovascular invasion and high grade ductal carcinoma in situ as well ([id number]).    [name] discussed the situation with me and we felt it would be useful to have a PET scan prior to definitive surgery.  This was obtained on 12-30-05 and showed the primary axillary mass to have an SUV of 8.5.  The lymph nodes in the left axilla had SUV's in the 2.2 range, small focus of increased activity in the left breast had an SUV of 2.3.  There was also skin thickening with mildly increased diffuse FDG uptake within the skin suggesting an inflammatory breast cancer.  The neck, abdomen and  pelvis were negative.  In the lung windows, there were several scattered small areas of focal nodular density in the upper lobes with compressive atelectasis and this was felt to be benign.  Chest x-ray on 01-09-06 was negative.  With this information, [name] proceeded, after appropriate discussion, to left modified radical mastectomy on 01-15-06 and the insertion of a BARD port at the same time.  The final pathology ([id number]) showed the maximum tumor size to be 5 cm.  Margins were ample.  There was evidence of lymphovascular invasion.  This infiltrating ductal carcinoma was a grade 3 of 3 and 11 out of 14 lymph nodes were involved.  There was evidence of  extracapsular extension.  Her subsequent history is as detailed below.   INTERVAL HISTORY: Glendy returns today with her husband gene to discuss results of her recent staging scans which unfortunately now show significant liver involvement. 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 They just returned from a trip with her grandchildren to [place].  REVIEW OF SYSTEMS: Denaly remains significantly fatigued, and spends quite a bit of time during her trip resting. She has a terrible taste in her  mouth, is nauseated, but is now vomiting less since she started taking Maalox. 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 She is beginning to think of her "Legacy". She feels the lymph nodes in her neck are beginning to grow again. A detailed review of systems was otherwise negative and specifically she denies unusual headaches, visual changes dizziness or gait imbalance.  PAST MEDICAL HISTORY: Past Medical History  Diagnosis Date  . Hypertension   . Depression   . Hydronephrosis, bilateral   . Breast cancer metastasized to multiple sites POSITIVE METS IN 2012-----  ONCOLOGIST--  DR Darnelle Catalan--  CURRENTLY RECEIVING CHEMO  EVERY 3 WEEKS (STARTED 06-15-2012)    FIRST DX 01-15-2006, INVASIVE DUCTAL GRADE III/    S/P LEFT MASTECTOMY AND CHEMORADIATION (CHEMO COMPLETE NOV 2007 AND XRT COMPLETE FEB 2008)  remote tonsillectomy, remote cholecystectomy, status post appendectomy, multiple skin biopsies for what the patient said were simple keratoses, and status post port placement.  FAMILY HISTORY: The patient has no information regarding her father. She was brought up by her stepfather. The patient's mother died at the age of 76 with bladder cancer. The patient has one sister who is in good health. There is no history of breast or ovarian cancer in the family to the patient's knowledge.   GYNECOLOGIC HISTORY: She is GX, P2. She was premenopausal at the time of her initial breast cancer diagnosis  SOCIAL HISTORY: She and her husband, Gene, have been married 10 years. 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 She has two children, a daughter, [name], who lives in [place] and is a Investment banker, corporate, and a son, [name], also in [place], who works for [employer]. She has two grandchildren and three step-grandchildren from her own  two children, [name] and [name], who are from her first marriage. Gene has one daughter from his first marriage and he has grandchildren through her. The patient was brought up as a Catholic but currently is not practicing. She and Gene are attending a [employer].      ADVANCED DIRECTIVES:  HEALTH MAINTENANCE: History  Substance Use Topics  . Smoking status: Former Smoker -- 20 years    Quit date: 01/31/1995  . Smokeless tobacco: Never Used  . Alcohol Use: Yes     Comment: occasionl     Colonoscopy:  PAP:  Bone density: August 2012, "Normal"  Lipid panel:  No Known Allergies  Current Outpatient Prescriptions  Medication Sig Dispense Refill  . calcium citrate (CALCITRATE - DOSED IN MG ELEMENTAL CALCIUM) 950 MG tablet Take 1 tablet by mouth daily.       . cholecalciferol (VITAMIN D) 1000 UNITS tablet Take 3,000 Units by mouth daily.       . cholestyramine (QUESTRAN) 4 G packet Take 1 packet by mouth 2 (two) times daily with a meal. Mix with water.      Marland Kitchen  Desloratadine-Pseudoephedrine (CLARINEX-D 24 HOUR PO) Take 1 capsule by mouth daily.      Marland Kitchen FLUoxetine (PROZAC) 40 MG capsule TAKE ONE CAPSULE BY MOUTH ONCE DAILY  30 capsule  6  . gabapentin (NEURONTIN) 300 MG capsule Take 1 capsule (300 mg total) by mouth at bedtime.  30 capsule  5  . lidocaine (XYLOCAINE) 5 % ointment Apply topically 2 (two) times daily as needed.  30 g  1  . lidocaine-prilocaine (EMLA) cream Apply topically as needed.  30 g  3  . loperamide (IMODIUM) 2 MG capsule Take 2 mg by mouth 4 (four) times daily as needed. diarrhea      . LORazepam (ATIVAN) 0.5 MG tablet Take 1 tablet (0.5 mg total) by mouth 2 (two) times daily as needed.  60 tablet  0  . losartan (COZAAR) 50 MG tablet Take 1 tablet (50 mg total) by mouth daily.  30 tablet  6  . metoCLOPramide (REGLAN) 10 MG tablet Take 0.5 tablets (5 mg total) by mouth 4 (four) times daily -  before meals and at bedtime.  120 tablet  12  . omeprazole (PRILOSEC)  40 MG capsule Take 1 capsule (40 mg total) by mouth at bedtime.  30 capsule  12  . ondansetron (ZOFRAN) 8 MG tablet Take 8 mg by mouth 2 (two) times daily as needed for nausea.      Marland Kitchen oxybutynin (DITROPAN) 5 MG tablet       . oxyCODONE (OXY IR/ROXICODONE) 5 MG immediate release tablet Take 5 mg by mouth every 4 (four) hours as needed.       . potassium chloride (MICRO-K) 10 MEQ CR capsule Take 2 capsules (20 mEq total) by mouth daily.  60 capsule  1  . predniSONE (DELTASONE) 5 MG tablet Take 5 mg by mouth daily. Take x 4 days after discontinuing dexamethasone.-- TO START ON 09-12-2012      . prochlorperazine (COMPAZINE) 10 MG tablet Take 1 tablet (10 mg total) by mouth every 6 (six) hours as needed.  30 tablet  3  . tobramycin-dexamethasone (TOBRADEX) ophthalmic solution Place 1 drop into both eyes 2 (two) times daily.  5 mL  1  . valACYclovir (VALTREX) 1000 MG tablet Take 1 tablet (1,000 mg total) by mouth 2 (two) times daily.  28 tablet  0   No current facility-administered medications for this visit.   Facility-Administered Medications Ordered in Other Visits  Medication Dose Route Frequency Provider Last Rate Last Dose  . heparin lock flush 100 unit/mL  500 Units Intravenous Once Lowella Dell, MD      . sodium chloride 0.9 % injection 10 mL  10 mL Intravenous PRN Lowella Dell, MD         OBJECTIVE: Middle age white woman in no acute distress Filed Vitals:   11/30/12 0850  BP: 103/67  Pulse: 91  Temp: 98.4 F (36.9 C)  Resp: 20  Body mass index is 22.65 kg/(m^2). Filed Weights   11/30/12 0850  Weight: 132 lb (59.875 kg)   ECOG: 2 Sclerae unicteric Oropharynx clear No bilateral cervical adenopathy, left greater than right, also supraclavicular adenopathy left greater than right Lungs no rales or rhonchi Heart regular rate and rhythm Abd benign MSK no focal spinal tenderness, no peripheral edema Neuro: nonfocal, well oriented, appropriate affect Breasts:  Deferred     LAB RESULTS:  Lab Results  Component Value Date   WBC 5.5 11/30/2012   NEUTROABS 3.6 11/30/2012   HGB 11.0* 11/30/2012  HCT 33.6* 11/30/2012   MCV 89.1 11/30/2012   PLT 290 11/30/2012      Chemistry      Component Value Date/Time   NA 136 11/30/2012 0835   NA 140 10/20/2012 1113   NA 142 11/05/2010 1036   K 4.0 11/30/2012 0835   K 4.6 10/20/2012 1113   K 4.5 11/05/2010 1036   CL 99 11/30/2012 0835   CL 102 02/01/2012 0500   CL 99 11/05/2010 1036   CO2 26 11/30/2012 0835   CO2 25 02/01/2012 0500   CO2 26 11/05/2010 1036   BUN 14.7 11/30/2012 0835   BUN 13 02/01/2012 0500   BUN 14 11/05/2010 1036   CREATININE 1.4* 11/30/2012 0835   CREATININE 0.97 02/01/2012 0500   CREATININE 0.8 11/05/2010 1036      Component Value Date/Time   CALCIUM 9.3 11/30/2012 0835   CALCIUM 8.6 02/01/2012 0500   CALCIUM 8.8 11/05/2010 1036   ALKPHOS 106 11/30/2012 0835   ALKPHOS 88 01/08/2012 0916   ALKPHOS 106* 11/05/2010 1036   AST 15 11/30/2012 0835   AST 24 01/08/2012 0916   AST 27 11/05/2010 1036   ALT 14 11/30/2012 0835   ALT 27 01/08/2012 0916   BILITOT 0.59 11/30/2012 0835   BILITOT 0.9 01/08/2012 0916   BILITOT 0.80 11/05/2010 1036       Lab Results  Component Value Date   LABCA2 107* 08/17/2012    STUDIES: Ct Soft Tissue Neck W Contrast  11/17/2012   *RADIOLOGY REPORT*  Clinical Data: 62 year old female with metastatic breast cancer. Chemotherapy and radiation completed.  Enlarged lymph nodes in the neck.  CT NECK WITH CONTRAST  Technique:  Multidetector CT imaging of the neck was performed with intravenous contrast.  Contrast: OMNIPAQUE IOHEXOL 300 MG/ML  SOLN In conjunction with CT(s) of the chest abdomen and pelvis which is(are) reported separately.  Comparison: 08/31/2012 and earlier.  Findings: Chest findings are reported separately.  Progression of left level IV/supraclavicular metastatic nodal disease, including enlarged and now spiculated margins of the index node on series 4 image 73  - now up to 15 mm (previously up to 11 mm on prior study series 6 image 70).  Multiple other adjacent multiple other nearby metastatic nodules show mildly increased size and spiculation, and this includes a lesion which is now inseparable from and might to occlude the left external jugular vein on series 4 image 69.  Other cervical lymph node stations remain normal, with stable to mildly decreased lymph nodes elsewhere.  Other major vascular structures in the neck including the left IJ remain patent. Visualized orbit soft tissues are within normal limits.  Stable right neck Port-A-Cath catheter.  Negative visualized brain parenchyma.  Negative nasopharynx, parapharyngeal spaces, retropharyngeal space, thyroid, larynx, hypopharynx, oropharynx, submandibular glands and parotid glands.  Visualized paranasal sinuses and mastoids are clear.  No acute or suspicious osseous lesion identified in the neck.  However, there is a progressed spiculated left paraspinal mass at the cervicothoracic junction which appears intimately associated with the left extraforaminal seventh nerve (series 4 image 79). This has spiculated margins similar to the nearby nodal disease and itself encompasses 18 x 15 x 830 mm. This is nearly contiguous with the left superior mediastinal prevascular nodal disease (and was described as a component of those findings on the 08/31/2012 chest exam).  See coronal image 56.  IMPRESSION: 1.  Interval progression of metastatic nodal/Ex-nodal disease in the left supraclavicular fossa and level IV.  A 12 mm  lesion on series 4 image 69 now is inseparable from and might occlude the left external jugular vein. 2.  Progressed left paraspinal metastasis which may involve or have originated from the left C7 nerve.  This is nearly inseparable from the left superior mediastinal prevascular disease which has been followed over this series exams. 3.  See chest, abdomen, and pelvis findings reported separately.   Original  Report Authenticated By: Erskine Speed, M.D.   Ct Chest W Contrast  11/17/2012   *RADIOLOGY REPORT*  Clinical Data:  Metastatic breast cancer.  Assess for progression of disease.  CT CHEST, ABDOMEN AND PELVIS WITH CONTRAST  Technique:  Multidetector CT imaging of the chest, abdomen and pelvis was performed following the standard protocol during bolus administration of intravenous contrast.  Contrast: OMNIPAQUE IOHEXOL 300 MG/ML  SOLN  Comparison:  CT 05/29/2012, PET CT 05/29/2012  CT CHEST  Findings:  Port in the right anterior chest wall.  Abnormal left supraclavicular lymph nodes are not completely imaged (image #1). No mediastinal hilar lymphadenopathy.  No pericardial fluid.  Review of the lung windows demonstrates no suspicious pulmonary nodules.  IMPRESSION:  1.  Left supraclavicular lymphadenopathy is not well imaged. 2.  No pulmonary metastasis.  CT ABDOMEN AND PELVIS  Findings:  There is multiple new hepatic metastasis involving the left and right hepatic lobe.  These are round hypodense lesions measuring approximately 2 cm each numbering 12 in the right hepatic lobe and 6 in the left hepatic lobe.  Exemplary right lesion is 21 mm lesion (51, series 2).  15 mm lesion in the right hepatic lobe (image 58).  22 mm lesion in the left lateral hepatic lobe.   22 mm hemangiomas in the inferior aspect of the right hepatic lobe.  The retroperitoneal mass like conglomeration which involves the left adrenal gland is increased mildly.  Superior aspect of the mass involving left adrenal gland measures 60 cm compared to 58 cm on prior.  Left periaortic mass adjacent to the renal hilum measures 64 x 58 compared to 62 x 51 mm.   Large nodal mass anterior to the aorta measures 33 mm compared to 34 mm on prior.  There are bilateral ureteral stents in place.  Mild hydronephrosis on the left.  The right adrenal glands normal.  Spleen is normal.  Ureteral stents noted in the bladder r.  The uterus is normal.  The ovaries  appear normal.  No pelvic lymphadenopathy.  IMPRESSION: 1.  New multifocal hepatic metastasis in the left right hepatic lobe.  2.  Left periaortic and central retroperitoneal mass increased mildly in size. 2.  Left adrenal mass is slightly increased size.   Original Report Authenticated By: Genevive Bi, M.D.   Ct Abdomen Pelvis W Contrast  11/17/2012   *RADIOLOGY REPORT*  Clinical Data:  Metastatic breast cancer.  Assess for progression of disease.  CT CHEST, ABDOMEN AND PELVIS WITH CONTRAST  Technique:  Multidetector CT imaging of the chest, abdomen and pelvis was performed following the standard protocol during bolus administration of intravenous contrast.  Contrast: OMNIPAQUE IOHEXOL 300 MG/ML  SOLN  Comparison:  CT 05/29/2012, PET CT 05/29/2012  CT CHEST  Findings:  Port in the right anterior chest wall.  Abnormal left supraclavicular lymph nodes are not completely imaged (image #1). No mediastinal hilar lymphadenopathy.  No pericardial fluid.  Review of the lung windows demonstrates no suspicious pulmonary nodules.  IMPRESSION:  1.  Left supraclavicular lymphadenopathy is not well imaged. 2.  No  pulmonary metastasis.  CT ABDOMEN AND PELVIS  Findings:  There is multiple new hepatic metastasis involving the left and right hepatic lobe.  These are round hypodense lesions measuring approximately 2 cm each numbering 12 in the right hepatic lobe and 6 in the left hepatic lobe.  Exemplary right lesion is 21 mm lesion (51, series 2).  15 mm lesion in the right hepatic lobe (image 58).  22 mm lesion in the left lateral hepatic lobe.   22 mm hemangiomas in the inferior aspect of the right hepatic lobe.  The retroperitoneal mass like conglomeration which involves the left adrenal gland is increased mildly.  Superior aspect of the mass involving left adrenal gland measures 60 cm compared to 58 cm on prior.  Left periaortic mass adjacent to the renal hilum measures 64 x 58 compared to 62 x 51 mm.   Large nodal  mass anterior to the aorta measures 33 mm compared to 34 mm on prior.  There are bilateral ureteral stents in place.  Mild hydronephrosis on the left.  The right adrenal glands normal.  Spleen is normal.  Ureteral stents noted in the bladder r.  The uterus is normal.  The ovaries appear normal.  No pelvic lymphadenopathy.  IMPRESSION: 1.  New multifocal hepatic metastasis in the left right hepatic lobe.  2.  Left periaortic and central retroperitoneal mass increased mildly in size. 2.  Left adrenal mass is slightly increased size.   Original Report Authenticated By: Genevive Bi, M.D.   Nm Pet Image Restag (ps) Skull Base To Thigh  11/17/2012   *RADIOLOGY REPORT*  Clinical Data: Subsequent treatment strategy for breast cancer.  NUCLEAR MEDICINE PET SKULL BASE TO THIGH  Fasting Blood Glucose:  104  Technique:  17.8 mCi F-18 FDG was injected intravenously. CT data was obtained and used for attenuation correction and anatomic localization only.  (This was not acquired as a diagnostic CT examination.) Additional exam technical data entered on technologist worksheet.  Comparison:  PET CT 05/29/2012, CT 11/17/2012  Findings:  Neck: Hypermetabolic nodes on the left level II and V neck not changed from prior.  Hypermetabolic nodal mass adjacent to the C7 vertebral body on the left (image 49).  Chest:  No hypermetabolic mediastinal or hilar nodes.  No suspicious pulmonary nodules on the CT scan.  Abdomen/Pelvis:  There are new hypermetabolic hepatic metastasis. There approximately eight lesions in the right hepatic lobe and six in the left hepatic lobe.  Exemplary lesion with SUV max = 9.6 in the right hepatic lobe (image 103).  Lesion in the left hepatic lobe on same image with  SUV max 11.2  The periaortic nodal conglomeration remains intensely hypermetabolic similar to comparison exam.  There is mild decrease in measured activity with SUV max = equal 10.9 compared to the SUV max = 14.0.  Hypermetabolic active  associated with the enlarged left adrenal gland again demonstrated.  There is new hypermetabolic active associated the small right inguinal lymph node (image 215).  Skeleton:  No focal hypermetabolic activity to suggest skeletal metastasis.  IMPRESSION:  1.  New hypermetabolic hepatic metastasis involving the left and right hepatic lobe. 2.  Persistent intense hypermetabolic activity associated with the retroperitoneal nodal conglomerate mass and left adrenal gland. 3.  New small hypermetabolic nodal metastasis in the right inguinal lymph node station.   Original Report Authenticated By: Genevive Bi, M.D.     ASSESSMENT: 62 y.o.  [facility] woman with stage IV breast cancer.  1. Status post  left modified radical mastectomy, August 2007, for a 5-cm invasive ductal carcinoma, grade 3, involving 11/14 lymph nodes, triple positive. 2. Status post 4 cycles of adjuvant docetaxel, carboplatin and trastuzumab completed November 2007. 3. Postmastectomy radiation completed February 2008. 4. Anastrozole begun February 2008 (the trastuzumab also was continued to complete a year), continued until June 2012.  5. 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 with left retrocrural and retroperitoneal adenopathy.  6. On tamoxifen/lapatinib/trastuzumab between June 2012 and October 2012.  7. Ixempra/lapatinib/trastuzumab started October 2012, the Ixempra discontinued in February 2013 due to peripheral neuropathy. 8. Continuing on lapatinib/trastuzumab with the addition of fulvestrant, first injections given on 07/31/2011. Lapatinib given at a dose of 1 g (4 tablets) daily, and trastuzumab given at 8 mg/kg every 4 weeks to coordinate with injection appointments. 9. Discontinued lapatinib and fulvestrant in December 2013 10. Started q. three-week docetaxel/ trastuzumab/ pertuzumab,  first dose given 06/15/2012.  Docetaxel given at a 50% dose reduction beginning with cycle 2 due to poor tolerance and severe neutropenia. Completed 6 cycles as of 09/28/2012.  11. Trastuzumab/ pertuzumab continued  to June of 2014, with evidence of disease progression. Letrozole added 10/19/2012. 12. Bilateral hydronephrosis noted March 2014, bilateral ureteral stenting performed 09/11/2012 under Dr Brunilda Payor 13. Herpes Zoster June 2014, resolved 14. 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 We consider treatment with carboplatin, carboplatin and Gemzar, or TDM 1. After much discussion we opted for TDM 1 and she received her first dose today. She has a good understanding of the possible toxicities side effects and complications. The plan is to complete at least 3 cycles and then restage.  Corynn and her husband Gene were appropriately concerned regarding the development of liver involvement. They understand this is life-threatening, although not immediately so. We do have other chemotherapy options if he currently been does not work. On the other hand if no treatment works then the threat to Mirjana's life is more imminent. For that reason it is appropriate for her to think of her "Legacy" as she puts it, as well as to complete all the paperwork needed in preparation of her possibly reaching a point where she might not be able to do it for any reason. We did not specifically discuss advanced directives today, but we will discuss this before restaging studies which will be obtained in approximately 2 months. I am also setting neuropathy for an MRI of the brain at this point. Basia knows to call for any problems that may develop before the next visit here.    Nasiir Monts C    11/30/2012

## 2012-11-24 NOTE — Telephone Encounter (Signed)
Brief Outpatient Oncology Nutrition Note  Patient has been identified to be at risk on malnutrition screen.  Wt Readings from Last 10 Encounters:  11/04/12 138 lb 9.6 oz (62.869 kg)  10/20/12 155 lb (70.308 kg)  10/20/12 155 lb (70.308 kg)  10/19/12 149 lb 1.6 oz (67.631 kg)  10/05/12 147 lb (66.679 kg)  09/28/12 153 lb (69.4 kg)  09/16/12 150 lb 6.4 oz (68.221 kg)  09/11/12 156 lb (70.761 kg)  09/11/12 156 lb (70.761 kg)  09/08/12 157 lb (71.215 kg)   Patient with weight loss as noted above.  Called patient's home and cell without response.  Left contact information for [facility].  Oran Rein, RD, LDN

## 2012-11-25 ENCOUNTER — Other Ambulatory Visit: Payer: Self-pay | Admitting: Oncology

## 2012-11-26 ENCOUNTER — Encounter (HOSPITAL_COMMUNITY): Admission: RE | Admit: 2012-11-26 | Payer: Medicare Other | Source: Ambulatory Visit

## 2012-11-26 ENCOUNTER — Ambulatory Visit (HOSPITAL_COMMUNITY): Payer: Medicare Other

## 2012-11-27 ENCOUNTER — Telehealth: Payer: Self-pay | Admitting: *Deleted

## 2012-11-27 NOTE — Telephone Encounter (Signed)
Lm informing the pt that [facility] would like for her to see Dr.Nesi on 12/04/12@2 :15pm. i gv the address and phone number in case she had futher questions. In which the address i gave was [address]

## 2012-11-30 ENCOUNTER — Telehealth: Payer: Self-pay | Admitting: *Deleted

## 2012-11-30 ENCOUNTER — Other Ambulatory Visit (HOSPITAL_BASED_OUTPATIENT_CLINIC_OR_DEPARTMENT_OTHER): Payer: Medicare Other | Admitting: Lab

## 2012-11-30 ENCOUNTER — Ambulatory Visit (HOSPITAL_BASED_OUTPATIENT_CLINIC_OR_DEPARTMENT_OTHER): Payer: Medicare Other | Admitting: Oncology

## 2012-11-30 ENCOUNTER — Other Ambulatory Visit: Payer: Medicare Other | Admitting: Lab

## 2012-11-30 ENCOUNTER — Ambulatory Visit (HOSPITAL_BASED_OUTPATIENT_CLINIC_OR_DEPARTMENT_OTHER): Payer: Medicare Other

## 2012-11-30 VITALS — BP 103/67 | HR 91 | Temp 98.4°F | Resp 20 | Ht 64.0 in | Wt 132.0 lb

## 2012-11-30 DIAGNOSIS — C773 Secondary and unspecified malignant neoplasm of axilla and upper limb lymph nodes: Secondary | ICD-10-CM

## 2012-11-30 DIAGNOSIS — C50219 Malignant neoplasm of upper-inner quadrant of unspecified female breast: Secondary | ICD-10-CM

## 2012-11-30 DIAGNOSIS — C50919 Malignant neoplasm of unspecified site of unspecified female breast: Secondary | ICD-10-CM

## 2012-11-30 DIAGNOSIS — N1330 Unspecified hydronephrosis: Secondary | ICD-10-CM

## 2012-11-30 DIAGNOSIS — C7970 Secondary malignant neoplasm of unspecified adrenal gland: Secondary | ICD-10-CM

## 2012-11-30 DIAGNOSIS — Z5112 Encounter for antineoplastic immunotherapy: Secondary | ICD-10-CM

## 2012-11-30 DIAGNOSIS — C787 Secondary malignant neoplasm of liver and intrahepatic bile duct: Secondary | ICD-10-CM

## 2012-11-30 DIAGNOSIS — C50912 Malignant neoplasm of unspecified site of left female breast: Secondary | ICD-10-CM

## 2012-11-30 LAB — CBC WITH DIFFERENTIAL/PLATELET
Eosinophils Absolute: 0.2 10*3/uL (ref 0.0–0.5)
HCT: 33.6 % — ABNORMAL LOW (ref 34.8–46.6)
HGB: 11 g/dL — ABNORMAL LOW (ref 11.6–15.9)
LYMPH%: 20.6 % (ref 14.0–49.7)
MONO#: 0.6 10*3/uL (ref 0.1–0.9)
NEUT#: 3.6 10*3/uL (ref 1.5–6.5)
NEUT%: 64.6 % (ref 38.4–76.8)
Platelets: 290 10*3/uL (ref 145–400)
WBC: 5.5 10*3/uL (ref 3.9–10.3)
lymph#: 1.1 10*3/uL (ref 0.9–3.3)

## 2012-11-30 LAB — COMPREHENSIVE METABOLIC PANEL (CC13)
ALT: 14 U/L (ref 0–55)
AST: 15 U/L (ref 5–34)
Albumin: 3 g/dL — ABNORMAL LOW (ref 3.5–5.0)
BUN: 14.7 mg/dL (ref 7.0–26.0)
CO2: 26 mEq/L (ref 22–29)
Calcium: 9.3 mg/dL (ref 8.4–10.4)
Chloride: 99 mEq/L (ref 98–107)
Creatinine: 1.4 mg/dL — ABNORMAL HIGH (ref 0.6–1.1)
Potassium: 4 mEq/L (ref 3.5–5.1)

## 2012-11-30 MED ORDER — ACETAMINOPHEN 325 MG PO TABS
650.0000 mg | ORAL_TABLET | Freq: Once | ORAL | Status: AC
  Administered 2012-11-30: 650 mg via ORAL

## 2012-11-30 MED ORDER — METOCLOPRAMIDE HCL 10 MG PO TABS: 5.0000 mg | ORAL_TABLET | Freq: Three times a day (TID) | ORAL | Status: AC

## 2012-11-30 MED ORDER — DIPHENHYDRAMINE HCL 25 MG PO CAPS
25.0000 mg | ORAL_CAPSULE | Freq: Once | ORAL | Status: AC
  Administered 2012-11-30: 25 mg via ORAL

## 2012-11-30 MED ORDER — SODIUM CHLORIDE 0.9 % IV SOLN
3.6000 mg/kg | Freq: Once | INTRAVENOUS | Status: AC
  Administered 2012-11-30: 220 mg via INTRAVENOUS
  Filled 2012-11-30: qty 11

## 2012-11-30 MED ORDER — OMEPRAZOLE 40 MG PO CPDR: 40.0000 mg | DELAYED_RELEASE_CAPSULE | Freq: Every day | ORAL | Status: AC

## 2012-11-30 MED ORDER — HEPARIN SOD (PORK) LOCK FLUSH 100 UNIT/ML IV SOLN
500.0000 [IU] | Freq: Once | INTRAVENOUS | Status: AC | PRN
  Administered 2012-11-30: 500 [IU]
  Filled 2012-11-30: qty 5

## 2012-11-30 MED ORDER — SODIUM CHLORIDE 0.9 % IJ SOLN
10.0000 mL | INTRAMUSCULAR | Status: DC | PRN
  Administered 2012-11-30: 10 mL
  Filled 2012-11-30: qty 10

## 2012-11-30 MED ORDER — SODIUM CHLORIDE 0.9 % IV SOLN
Freq: Once | INTRAVENOUS | Status: AC
  Administered 2012-11-30: 10:00:00 via INTRAVENOUS

## 2012-11-30 NOTE — Patient Instructions (Addendum)
[facility] Discharge Instructions for Patients Receiving Chemotherapy  Today you received the following chemotherapy agents: Kadcyla  To help prevent nausea and vomiting after your treatment, we encourage you to take your nausea medication as prescribed.   If you develop nausea and vomiting that is not controlled by your nausea medication, call the clinic.   BELOW ARE SYMPTOMS THAT SHOULD BE REPORTED IMMEDIATELY:  *FEVER GREATER THAN 100.5 F  *CHILLS WITH OR WITHOUT FEVER  NAUSEA AND VOMITING THAT IS NOT CONTROLLED WITH YOUR NAUSEA MEDICATION  *UNUSUAL SHORTNESS OF BREATH  *UNUSUAL BRUISING OR BLEEDING  TENDERNESS IN MOUTH AND THROAT WITH OR WITHOUT PRESENCE OF ULCERS  *URINARY PROBLEMS  *BOWEL PROBLEMS  UNUSUAL RASH Items with * indicate a potential emergency and should be followed up as soon as possible.  Feel free to call the clinic you have any questions or concerns. The clinic phone number is [phone].    

## 2012-11-30 NOTE — Telephone Encounter (Signed)
appts made and printed. Pt is aware that we are waiting on a time slot for 12/17/12. She is also aware that her tx will be adjusted on 01/11/13. i emailed MW to adjust the schedule. i also emailed [facility] for a new time slot so that [facility] will not be doubled booked. Pt is aware that cs will call her with appt for her MRI....td

## 2012-11-30 NOTE — Telephone Encounter (Signed)
Per staff message and POF I have adjusted appt for 7/28.  JMW

## 2012-12-01 ENCOUNTER — Ambulatory Visit: Payer: Medicare Other

## 2012-12-02 ENCOUNTER — Telehealth: Payer: Self-pay | Admitting: *Deleted

## 2012-12-02 NOTE — Telephone Encounter (Signed)
Lm gv appt d/t for 12/17/12. Labs@ 9:45am and ov@ 10:15am. i also made pt aware that i will mail an new AVS...td

## 2012-12-04 ENCOUNTER — Ambulatory Visit (HOSPITAL_COMMUNITY)
Admission: RE | Admit: 2012-12-04 | Discharge: 2012-12-04 | Disposition: A | Discharge status: Home / Self Care | Payer: Medicare Other | Source: Ambulatory Visit | Attending: Oncology | Admitting: Oncology

## 2012-12-04 DIAGNOSIS — C50919 Malignant neoplasm of unspecified site of unspecified female breast: Secondary | ICD-10-CM | POA: Insufficient documentation

## 2012-12-04 DIAGNOSIS — C50912 Malignant neoplasm of unspecified site of left female breast: Secondary | ICD-10-CM

## 2012-12-04 DIAGNOSIS — C801 Malignant (primary) neoplasm, unspecified: Secondary | ICD-10-CM | POA: Insufficient documentation

## 2012-12-04 DIAGNOSIS — N1330 Unspecified hydronephrosis: Secondary | ICD-10-CM

## 2012-12-04 DIAGNOSIS — I739 Peripheral vascular disease, unspecified: Secondary | ICD-10-CM | POA: Insufficient documentation

## 2012-12-04 MED ORDER — GADOBENATE DIMEGLUMINE 529 MG/ML IV SOLN
6.0000 mL | Freq: Once | INTRAVENOUS | Status: AC | PRN
  Administered 2012-12-04: 6 mL via INTRAVENOUS

## 2012-12-07 ENCOUNTER — Ambulatory Visit (HOSPITAL_BASED_OUTPATIENT_CLINIC_OR_DEPARTMENT_OTHER)
Admission: RE | Admit: 2012-12-07 | Discharge: 2012-12-07 | Disposition: A | Discharge status: Home / Self Care | Payer: Medicare Other | Source: Ambulatory Visit | Attending: Internal Medicine | Admitting: Internal Medicine

## 2012-12-07 ENCOUNTER — Encounter (HOSPITAL_COMMUNITY): Payer: Self-pay

## 2012-12-07 ENCOUNTER — Ambulatory Visit (HOSPITAL_COMMUNITY)
Admission: RE | Admit: 2012-12-07 | Discharge: 2012-12-07 | Disposition: A | Discharge status: Home / Self Care | Payer: Medicare Other | Source: Ambulatory Visit | Attending: Family Medicine | Admitting: Family Medicine

## 2012-12-07 VITALS — BP 122/70 | HR 84 | Wt 130.8 lb

## 2012-12-07 DIAGNOSIS — Z09 Encounter for follow-up examination after completed treatment for conditions other than malignant neoplasm: Secondary | ICD-10-CM

## 2012-12-07 DIAGNOSIS — I517 Cardiomegaly: Secondary | ICD-10-CM | POA: Insufficient documentation

## 2012-12-07 DIAGNOSIS — C50919 Malignant neoplasm of unspecified site of unspecified female breast: Secondary | ICD-10-CM | POA: Insufficient documentation

## 2012-12-07 DIAGNOSIS — I10 Essential (primary) hypertension: Secondary | ICD-10-CM | POA: Insufficient documentation

## 2012-12-07 DIAGNOSIS — C50912 Malignant neoplasm of unspecified site of left female breast: Secondary | ICD-10-CM

## 2012-12-07 DIAGNOSIS — C800 Disseminated malignant neoplasm, unspecified: Secondary | ICD-10-CM

## 2012-12-07 MED ORDER — LOSARTAN POTASSIUM 50 MG PO TABS: 25.0000 mg | ORAL_TABLET | Freq: Every day | ORAL | Status: DC

## 2012-12-07 MED ORDER — CARVEDILOL 3.125 MG PO TABS: 3.1250 mg | ORAL_TABLET | Freq: Two times a day (BID) | ORAL | Status: DC

## 2012-12-07 NOTE — Progress Notes (Signed)
Patient ID: Kaliyan Osbourn, female   DOB: 08/24/1950, 62 y.o.   MRN: [id number] Referring Physician: [name] Primary Care: [name]  Primary Cardiologist: none  HPI: Mrs. Hagmann is a 62 y.o. female with history of stage IV breast cancer initially diagnosed in 2007.  She underwent 4 cycles of adjuvant paclitaxel/carboplatin/trastuzumab (completed 04/2006) and trastuzumab continued for 1 year.  She had postmastectomy radiation completed 07/2006.  Anastrozole began in 07/2006.  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  Started on tamoxifen, lapatinib, and trastuzumab between June and October of 2012, with progression.  She is now on ixabepilone, lapatinib, and trastuzumab beginning November of 2012, with fair tolerance.  She has completed four cycles of ixabepilone/lapatinib and will remain on herceptin indefinitely.  Discontinued lapatinib and fulvestrant in December 2013 Started q. three-week docetaxel/ trastuzumab/ pertuzumab, first dose given 06/15/2012 and completed 6 cycles as of 09/28/2012. Trastuzumab/ pertuzumab continued to June of 2014    Echos: 11/2010: EF 60-65% with poor windows to evaluate lateral S' 03/2011: EF60-65% with lateral S' peaks of 10.1 07/10/11: EF 55-60% lateral s' 9.6 10/08/11: EF 60%, lateral s' 9.6 01/09/12: EF 60-65%, lateral s' 11.5, 11.08 05/21/12: EF 60%, lateral s' 10.9 09/08/12: EF 65-70% lateral s' 10.9 12/07/12 EF 40%  lateral s' 11.9   She returns for follow up today. Reviewed Dr Princella Pellegrini last office visit and she now has significant liver involvement. She last received Herceptin in early June. She will no longer receive Herceptin and she was started onTDM  1   one week ago.  Denies SOB/PND/Orthopnea. Complaining of dizziness. She has lost 40 pounds over the last 6 weeks. Poor appetite.     Review of Systems: All pertinent positives and negatives as in HPI, otherwise negative.     Past Medical History  Diagnosis Date  . Hypertension   . Depression   . Hydronephrosis, bilateral   . Breast cancer metastasized to multiple sites POSITIVE METS IN 2012-----  ONCOLOGIST--  DR Darnelle Catalan--  CURRENTLY RECEIVING CHEMO  EVERY 3 WEEKS (STARTED 06-15-2012)    FIRST DX 01-15-2006, INVASIVE DUCTAL GRADE III/    S/P LEFT MASTECTOMY AND CHEMORADIATION (CHEMO COMPLETE NOV 2007 AND XRT COMPLETE FEB 2008)    Current Outpatient Prescriptions  Medication Sig Dispense Refill  . calcium citrate (CALCITRATE - DOSED IN MG ELEMENTAL CALCIUM) 950 MG tablet Take 1 tablet by mouth daily.       . cholecalciferol (VITAMIN D) 1000 UNITS tablet Take 3,000 Units by mouth daily.       . cholestyramine (QUESTRAN) 4 G packet Take 1 packet by mouth 2 (two) times daily with a meal. Mix with water.      . Desloratadine-Pseudoephedrine (CLARINEX-D 24 HOUR PO) Take 1 capsule by mouth daily.      Marland Kitchen FLUoxetine (PROZAC) 40 MG capsule TAKE ONE CAPSULE BY MOUTH ONCE DAILY  30 capsule  6  . gabapentin (NEURONTIN) 300 MG capsule Take 1 capsule (300 mg total) by mouth at bedtime.  30 capsule  5  . lidocaine (XYLOCAINE) 5 % ointment Apply topically 2 (two) times daily as needed.  30 g  1  . lidocaine-prilocaine (EMLA) cream Apply topically as needed.  30 g  3  . loperamide (IMODIUM) 2 MG capsule Take 2 mg by mouth 4 (four) times daily as needed. diarrhea      . LORazepam (ATIVAN) 0.5 MG tablet Take 1 tablet (0.5 mg total) by mouth 2 (two) times daily as needed.  60 tablet  0  . losartan (COZAAR) 50 MG tablet Take 0.5 tablets (25 mg total) by mouth daily.  30 tablet  6  . metoCLOPramide (REGLAN) 10 MG tablet Take 0.5 tablets (5 mg total) by mouth 4 (four) times daily -  before meals and at  bedtime.  120 tablet  12  . omeprazole (PRILOSEC) 40 MG capsule Take 1 capsule (40 mg total) by mouth at bedtime.  30 capsule  12  . ondansetron (ZOFRAN) 8 MG tablet Take 8 mg by mouth 2 (two) times daily as needed for nausea.      Marland Kitchen oxybutynin (DITROPAN) 5 MG tablet       . oxyCODONE (OXY IR/ROXICODONE) 5 MG immediate release tablet Take 5 mg by mouth every 4 (four) hours as needed.       . potassium chloride (MICRO-K) 10 MEQ CR capsule Take 2 capsules (20 mEq total) by mouth daily.  60 capsule  1  . predniSONE (DELTASONE) 5 MG tablet Take 5 mg by mouth daily. Take x 4 days after discontinuing dexamethasone.-- TO START ON 09-12-2012      . prochlorperazine (COMPAZINE) 10 MG tablet Take 1 tablet (10 mg total) by mouth every 6 (six) hours as needed.  30 tablet  3  . tobramycin-dexamethasone (TOBRADEX) ophthalmic solution Place 1 drop into both eyes 2 (two) times daily.  5 mL  1  . valACYclovir (VALTREX) 1000 MG tablet Take 1 tablet (1,000 mg total) by mouth 2 (two) times daily.  28 tablet  0  . carvedilol (COREG) 3.125 MG tablet Take 1 tablet (3.125 mg total) by mouth 2 (two) times daily with a meal.  60 tablet  3   No current facility-administered medications for this encounter.   Facility-Administered Medications Ordered in Other Encounters  Medication Dose Route Frequency Provider Last Rate Last Dose  . heparin lock flush 100 unit/mL  500 Units Intravenous Once Lowella Dell, MD      . sodium chloride 0.9 % injection 10 mL  10 mL Intravenous PRN Lowella Dell, MD        No Known Allergies   PHYSICAL EXAM: Filed Vitals:   12/07/12 1359  BP: 122/70  Pulse: 84  Weight: 130 lb 12.8 oz (59.33 kg)  SpO2: 97%   General:  Well appearing. No respiratory difficulty HEENT: Normal Neck: supple. no JVD. Carotids 2+ bilat; no bruits. No lymphadenopathy or thryomegaly appreciated. Cor: PMI nondisplaced. Regular rate & rhythm. No rubs, gallops or murmurs. Lungs: clear Abdomen: soft,  nontender, nondistended. No hepatosplenomegaly. No bruits or masses. Good bowel sounds. Extremities: no cyanosis, clubbing, rash, edema Neuro: alert & oriented x 3, cranial nerves grossly intact. moves all 4 extremities w/o difficulty. Affect pleasant.   ASSESSMENT & PLAN:

## 2012-12-07 NOTE — Patient Instructions (Addendum)
Follow up in 1 month with an ECHO   Take losartan 25 mg daily which is a half tablet  Take carvedilol 3.125 mg twice a day

## 2012-12-07 NOTE — Assessment & Plan Note (Addendum)
I have reviewed her echo in clinic today, unfortunately her ejection fraction is now down to 40%. She has no symptoms of heart failure. She has been taken off Herceptin and is now on gemcitabine. I looked in the literature and gemcitabine is quoted as having a 0.1% risk of cardiotoxicity thus I do not think this is the inciting agent. I suspect this might be left over from her Herceptin. As her blood pressure is soft we'll cut back losartan to 25 mg a day and add carvedilol 3.125 twice a day. Will follow her closely she knows the contact us if she develops any heart failure symptoms. Follow up in 1 month with an ECHO.

## 2012-12-08 ENCOUNTER — Other Ambulatory Visit: Payer: Self-pay | Admitting: Oncology

## 2012-12-08 ENCOUNTER — Other Ambulatory Visit: Payer: Self-pay | Admitting: Urology

## 2012-12-17 ENCOUNTER — Encounter: Payer: Self-pay | Admitting: Physician Assistant

## 2012-12-17 ENCOUNTER — Other Ambulatory Visit (HOSPITAL_BASED_OUTPATIENT_CLINIC_OR_DEPARTMENT_OTHER): Payer: Medicare Other | Admitting: Lab

## 2012-12-17 ENCOUNTER — Ambulatory Visit (HOSPITAL_BASED_OUTPATIENT_CLINIC_OR_DEPARTMENT_OTHER): Payer: Medicare Other | Admitting: Physician Assistant

## 2012-12-17 ENCOUNTER — Other Ambulatory Visit: Payer: Self-pay | Admitting: Oncology

## 2012-12-17 VITALS — BP 94/60 | HR 77 | Temp 98.1°F | Resp 20 | Ht 64.0 in | Wt 130.1 lb

## 2012-12-17 DIAGNOSIS — C50219 Malignant neoplasm of upper-inner quadrant of unspecified female breast: Secondary | ICD-10-CM

## 2012-12-17 DIAGNOSIS — C773 Secondary and unspecified malignant neoplasm of axilla and upper limb lymph nodes: Secondary | ICD-10-CM

## 2012-12-17 DIAGNOSIS — C50912 Malignant neoplasm of unspecified site of left female breast: Secondary | ICD-10-CM

## 2012-12-17 DIAGNOSIS — R630 Anorexia: Secondary | ICD-10-CM

## 2012-12-17 DIAGNOSIS — C50919 Malignant neoplasm of unspecified site of unspecified female breast: Secondary | ICD-10-CM

## 2012-12-17 DIAGNOSIS — F419 Anxiety disorder, unspecified: Secondary | ICD-10-CM

## 2012-12-17 DIAGNOSIS — M21371 Foot drop, right foot: Secondary | ICD-10-CM | POA: Insufficient documentation

## 2012-12-17 DIAGNOSIS — R931 Abnormal findings on diagnostic imaging of heart and coronary circulation: Secondary | ICD-10-CM

## 2012-12-17 DIAGNOSIS — C7970 Secondary malignant neoplasm of unspecified adrenal gland: Secondary | ICD-10-CM

## 2012-12-17 LAB — COMPREHENSIVE METABOLIC PANEL (CC13)
ALT: 15 U/L (ref 0–55)
AST: 26 U/L (ref 5–34)
Albumin: 2.9 g/dL — ABNORMAL LOW (ref 3.5–5.0)
CO2: 30 mEq/L — ABNORMAL HIGH (ref 22–29)
Calcium: 9.8 mg/dL (ref 8.4–10.4)
Chloride: 101 mEq/L (ref 98–109)
Potassium: 4.1 mEq/L (ref 3.5–5.1)
Total Protein: 7.1 g/dL (ref 6.4–8.3)

## 2012-12-17 LAB — CBC WITH DIFFERENTIAL/PLATELET
BASO%: 1 % (ref 0.0–2.0)
EOS%: 3.2 % (ref 0.0–7.0)
HCT: 30.8 % — ABNORMAL LOW (ref 34.8–46.6)
HGB: 10.5 g/dL — ABNORMAL LOW (ref 11.6–15.9)
MCH: 29.9 pg (ref 25.1–34.0)
MCHC: 34 g/dL (ref 31.5–36.0)
MONO#: 0.5 10*3/uL (ref 0.1–0.9)
NEUT%: 69.8 % (ref 38.4–76.8)
RDW: 14.8 % — ABNORMAL HIGH (ref 11.2–14.5)
WBC: 5.9 10*3/uL (ref 3.9–10.3)
lymph#: 1.1 10*3/uL (ref 0.9–3.3)

## 2012-12-17 NOTE — Progress Notes (Addendum)
ID: Rebecca Pugh   DOB: May 25, 1951  MR#: [id number]  WUJ#:[id number]  PCP: Rebecca Bur, MD GYN: SU: Rebecca Pugh OTHER MD: Rebecca Pugh  HISTORY OF PRESENT ILLNESS: The patient and her husband, Rebecca Pugh, moved back to this area recently from [facility] (Rebecca Pugh actually is a native of [place] and incidentally remains a [employer] fan).  As part of moving boxes, [name] felt something under her left arm.  She brought it to Dr. Lovena Pugh attention and although the patient states that she has always had some cyclical swelling of her left axilla during menstruation, Dr. Lalla Pugh set the patient up for evaluation at [facility].  On 12-12-05 the patient had a mammogram and ultrasound there and these showed no mass, distortion or microcalcifications in either breast however, in the left axilla there was a large macrolobulated mass and just above that there was a slightly prominent lymph node which did demonstrate a fatty hilum.  Physically on exam the mass was mobile and nontender and measured approximately 6.5 cm.  The ultrasound showed two small hypoechoic nodules in the left breast 3 cm. from the nipple measuring 7 mm. each and felt to be most likely benign however, the axillary mass, of course, was quite suspicious and biopsy was performed the same day.  This showed ([id number] and [id number]) invasive breast cancer which was strongly ER positive at 76%, moderately PR positive at 25% with a very high proliferation marker at 66%.  HER-2/neu was 1+ and FISH showed no amplification with a ratio of 1.1.    With this information, the patient was referred to Dr. Claud Pugh and on 12-20-05 the patient had bilateral breast MRI's.  In the left breast there was a 5.8 cm. axillary mass and several abnormally enlarged lymph nodes.  There was a 7 mm. left supraclavicular lymph node and the two nodules in the breast that had been noted by ultrasound were also noted by MRI measuring 1.1 and 0.7 cm.  In the right breast there  were no abnormalities noted.  MR guided biopsies of the left breast masses were obtained on 12-27-05 and both showed high grade ductal carcinoma.  There was evidence of lymphovascular invasion and high grade ductal carcinoma in situ as well ([id number]).    Dr. Derrell Pugh discussed the situation with me and we felt it would be useful to have a PET scan prior to definitive surgery.  This was obtained on 12-30-05 and showed the primary axillary mass to have an SUV of 8.5.  The lymph nodes in the left axilla had SUV's in the 2.2 range, small focus of increased activity in the left breast had an SUV of 2.3.  There was also skin thickening with mildly increased diffuse FDG uptake within the skin suggesting an inflammatory breast cancer.  The neck, abdomen and  pelvis were negative.  In the lung windows, there were several scattered small areas of focal nodular density in the upper lobes with compressive atelectasis and this was felt to be benign.  Chest x-ray on 01-09-06 was negative.  With this information, Dr. Derrell Pugh proceeded, after appropriate discussion, to left modified radical mastectomy on 01-15-06 and the insertion of a BARD port at the same time.  The final pathology ([id number]) showed the maximum tumor size to be 5 cm.  Margins were ample.  There was evidence of lymphovascular invasion.  This infiltrating ductal carcinoma was a grade 3 of 3 and 11 out of 14 lymph nodes were involved.  There was evidence of  extracapsular extension.  Her subsequent history is as detailed below.   INTERVAL HISTORY: Rebecca Pugh returns today with her husband Rebecca Pugh to discuss her metastatic breast carcinoma. Recent scans showed significant liver involvement, and Rebecca Pugh was started on TDM-1 in mid June. She received her first dose on 11/30/2012. Unfortunately, an echocardiogram on 12/07/2012 showed a reduced ejection fraction of 40% (thought to be secondary to trastuzumab), and TDM-1 had to be discontinued. She is here today to discuss  the results of her recent brain MRI, and to discuss options for treatment.  Fortunately she is asymptomatic of CHF. She has shortness of breath with exertion but this is stable. She denies any increased shortness of breath, orthopnea, PND, cough, or peripheral swelling.  Rebecca Pugh is feeling poorly over all. She is weak and tired. She's having a difficult time eating Q2 a decreased appetite and some slight taste aversion. She's lost approximately 25 pounds since early May. She denies any actual nausea or emesis, and is having regular bowel movements.  Rebecca Pugh also complains of a "pinched nerve" which is causing increased numbness in the left arm. She thinks this may be originating in her neck where she has several enlarged lymph nodes. She's also noticed foot drop on the right over the past couple of weeks, and this makes it more difficult to walk. She finds herself tripping, but fortunately has not fallen. Recall that she has a history of trauma to the right foot and underwent extensive surgery on the right foot several months ago.  Fortunately, her brain MRI on 12/05/2012 showed no evidence of brain metastasis, and this is detailed below.  REVIEW OF SYSTEMS: Rebecca Pugh has had no fevers or chills. She denies any skin changes. She's had no abnormal bleeding. She denies any abnormal headaches and has had no change in vision. She has had some episodes of dizziness and has had some issues with hypertension. She's currently on low started and 25 mg daily and carvedilol 3.125 mg twice daily as prescribed by Dr.  Gala Pugh.  Rebecca Pugh tells me she was recently at Dr.  Madilyn Pugh office, stood up too quickly, and "passed out". She knows that her blood pressure has been fluctuating. She also admits that she has not been drinking enough water. Understandably, Rebecca Pugh continues to be anxious and depressed, and she continues on fluoxetine 40 mg daily as well as lorazepam.  A detailed review of systems is otherwise stable.     PAST  MEDICAL HISTORY: Past Medical History  Diagnosis Date  . Hypertension   . Depression   . Hydronephrosis, bilateral   . Breast cancer metastasized to multiple sites POSITIVE METS IN 2012-----  ONCOLOGIST--  DR Darnelle Catalan--  CURRENTLY RECEIVING CHEMO  EVERY 3 WEEKS (STARTED 06-15-2012)    FIRST DX 01-15-2006, INVASIVE DUCTAL GRADE III/    S/P LEFT MASTECTOMY AND CHEMORADIATION (CHEMO COMPLETE NOV 2007 AND XRT COMPLETE FEB 2008)  remote tonsillectomy, remote cholecystectomy, status post appendectomy, multiple skin biopsies for what the patient said were simple keratoses, and status post port placement.  FAMILY HISTORY: The patient has no information regarding her father. She was brought up by her stepfather. The patient's mother died at the age of 53 with bladder cancer. The patient has one sister who is in good health. There is no history of breast or ovarian cancer in the family to the patient's knowledge.   GYNECOLOGIC HISTORY: She is GX, P2. She was premenopausal at the time of her initial breast cancer diagnosis  SOCIAL HISTORY: She and her  husband, Rebecca Pugh, have been married 10 years. 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 She has two children, a daughter, [name], who lives in [place] and is a Investment banker, corporate, and a son, [name], also in [place], who works for [employer]. She has two grandchildren and three step-grandchildren from her own two children, [name] and [name], who are from her first marriage. Rebecca Pugh has one daughter from his first marriage and he has grandchildren through her. The patient was brought up as a Catholic but currently is not practicing. She and Rebecca Pugh are attending a [employer].      ADVANCED DIRECTIVES:  HEALTH MAINTENANCE: History  Substance Use Topics  . Smoking status: Former Smoker -- 20 years    Quit date: 01/31/1995  . Smokeless tobacco: Never Used  . Alcohol Use: Yes      Comment: occasionl     Colonoscopy:  PAP:  Bone density: August 2012, "Normal"  Lipid panel:  No Known Allergies  Current Outpatient Prescriptions  Medication Sig Dispense Refill  . calcium citrate (CALCITRATE - DOSED IN MG ELEMENTAL CALCIUM) 950 MG tablet Take 1 tablet by mouth daily.       . carvedilol (COREG) 3.125 MG tablet Take 1 tablet (3.125 mg total) by mouth 2 (two) times daily with a meal.  60 tablet  3  . cholecalciferol (VITAMIN D) 1000 UNITS tablet Take 3,000 Units by mouth daily.       . cholestyramine (QUESTRAN) 4 G packet Take 1 packet by mouth 2 (two) times daily with a meal. Mix with water.      . Desloratadine-Pseudoephedrine (CLARINEX-D 24 HOUR PO) Take 1 capsule by mouth daily.      Marland Kitchen FLUoxetine (PROZAC) 40 MG capsule TAKE ONE CAPSULE BY MOUTH ONCE DAILY  30 capsule  6  . gabapentin (NEURONTIN) 300 MG capsule Take 1 capsule (300 mg total) by mouth at bedtime.  30 capsule  5  . letrozole (FEMARA) 2.5 MG tablet       . lidocaine (XYLOCAINE) 5 % ointment Apply topically 2 (two) times daily as needed.  30 g  1  . lidocaine-prilocaine (EMLA) cream Apply topically as needed.  30 g  3  . loperamide (IMODIUM) 2 MG capsule Take 2 mg by mouth 4 (four) times daily as needed. diarrhea      . LORazepam (ATIVAN) 0.5 MG tablet Take 1 tablet (0.5 mg total) by mouth 2 (two) times daily as needed.  60 tablet  0  . losartan (COZAAR) 50 MG tablet Take 0.5 tablets (25 mg total) by mouth daily.  30 tablet  6  . metoCLOPramide (REGLAN) 10 MG tablet Take 0.5 tablets (5 mg total) by mouth 4 (four) times daily -  before meals and at bedtime.  120 tablet  12  . omeprazole (PRILOSEC) 40 MG capsule Take 1 capsule (40 mg total) by mouth at bedtime.  30 capsule  12  . ondansetron (ZOFRAN) 8 MG tablet Take 8 mg by mouth 2 (two) times daily as needed for nausea.      Marland Kitchen oxybutynin (DITROPAN) 5 MG tablet       . oxyCODONE (OXY IR/ROXICODONE) 5 MG immediate release tablet Take 5 mg by mouth every  4 (four) hours as needed.       . potassium chloride (MICRO-K) 10 MEQ CR capsule Take 2 capsules (20 mEq total) by mouth daily.  60 capsule  1  . predniSONE (DELTASONE) 5 MG tablet Take 5 mg by mouth daily. Take x 4 days after discontinuing dexamethasone.-- TO START ON 09-12-2012      . prochlorperazine (COMPAZINE) 10 MG tablet Take 1 tablet (10 mg total) by mouth every 6 (six) hours as needed.  30 tablet  3  . tobramycin-dexamethasone (TOBRADEX) ophthalmic solution Place 1 drop into both eyes 2 (two) times daily.  5 mL  1  . valACYclovir (VALTREX) 1000 MG tablet Take 1 tablet (1,000 mg total) by mouth 2 (two) times daily.  28 tablet  0   No current facility-administered medications for this visit.   Facility-Administered Medications Ordered in Other Visits  Medication Dose Route Frequency Provider Last Rate Last Dose  . heparin lock flush 100 unit/mL  500 Units Intravenous Once Lowella Dell, MD      . sodium chloride 0.9 % injection 10 mL  10 mL Intravenous PRN Lowella Dell, MD         OBJECTIVE:  Filed Vitals:   12/17/12 1024  BP: 94/60  Pulse: 77  Temp: 98.1 F (36.7 C)  Resp: 20  Body mass index is 22.32 kg/(m^2). Filed Weights   12/17/12 1024  Weight: 130 lb 1.6 oz (59.013 kg)  Middle-aged white female who appears anxious and slightly uncomfortable ECOG: 2  Sclerae unicteric Oropharynx clear, buccal mucosa slightly dry, no ulcerations. Easily palpated bilateral cervical and supraclavicular adenopathy, left greater than right. Lungs clear to auscultation, no rales or rhonchi Heart regular rate and rhythm Abdomen nontender, positive bowel sounds MSK  no peripheral edema, right foot drop which is affecting gait Neuro: nonfocal, well oriented, appropriate affect Breasts: Deferred.     LAB RESULTS:  Lab Results  Component Value Date   WBC 5.9 12/17/2012   NEUTROABS 4.1 12/17/2012   HGB 10.5* 12/17/2012   HCT 30.8* 12/17/2012   MCV 87.8 12/17/2012   PLT 298 12/17/2012       Chemistry      Component Value Date/Time   NA 139 12/17/2012 0955   NA 140 10/20/2012 1113   NA 142 11/05/2010 1036   K 4.1 12/17/2012 0955   K 4.6 10/20/2012 1113   K 4.5 11/05/2010 1036   CL 99 11/30/2012 0835   CL 102 02/01/2012 0500   CL 99 11/05/2010 1036   CO2 30* 12/17/2012 0955   CO2 25 02/01/2012 0500   CO2 26 11/05/2010 1036   BUN 23.4 12/17/2012 0955   BUN 13 02/01/2012 0500   BUN 14 11/05/2010 1036   CREATININE 1.2* 12/17/2012 0955   CREATININE 0.97 02/01/2012 0500   CREATININE 0.8 11/05/2010 1036      Component Value Date/Time   CALCIUM 9.8 12/17/2012 0955   CALCIUM 8.6 02/01/2012 0500   CALCIUM 8.8 11/05/2010 1036   ALKPHOS 106 12/17/2012 0955   ALKPHOS 88 01/08/2012 0916   ALKPHOS 106* 11/05/2010 1036   AST 26 12/17/2012 0955   AST 24 01/08/2012 0916   AST 27 11/05/2010 1036   ALT 15 12/17/2012 0955   ALT 27 01/08/2012 0916   BILITOT 0.37 12/17/2012 0955   BILITOT 0.9 01/08/2012 0916   BILITOT 0.80 11/05/2010 1036       Lab Results  Component Value Date   LABCA2 107* 08/17/2012    STUDIES:  Mr Laqueta Jean JY Contrast  12/05/2012   *RADIOLOGY REPORT*  Clinical Data: Breast cancer.  Newly discovered liver metastases.  MRI HEAD WITHOUT AND WITH CONTRAST  Technique:  Multiplanar, multiecho pulse sequences of the brain and surrounding structures were obtained according to standard protocol without and with intravenous contrast  Contrast: 6mL MULTIHANCE GADOBENATE DIMEGLUMINE 529 MG/ML IV SOLN  Comparison: CT head cervical 11/09/2011. MRI brain 12/04/2010.  Findings: No acute stroke or hemorrhage.  No mass lesion or hydrocephalus.  No extra-axial fluid.  Normal cerebral volume. Minor white matter changes are suspected in the periventricular greater than subcortical regions, also in the mid pons, suggesting chronic microvascular ischemic change and/or post treatment effect. There is no midline shift.  Flow voids are maintained in the major intracranial vessels.  Post infusion, there is no abnormal  enhancement of the brain or meninges. There are no definite osseous metastases.  No skull base or upper cervical lesions.  Normal midline structures. The orbits, sinuses, and mastoids appear unremarkable. Pathologic cervical adenopathy is incompletely evaluated on this brain exam.  Prominence of the left jugular bulb may reflect nodal disease in the neck.  IMPRESSION: Unremarkable cranial MRI. Minor changes of small vessel disease. No evidence intracranial metastatic disease.   Original Report Authenticated By: Davonna Belling, M.D.     12/07/2012 *[place]* *[facility]* [address] [place], [place] [address] [phone]  ------------------------------------------------------------ Transthoracic Echocardiography  Patient: Rebecca Pugh, Regal MR #: [id number] Study Date: 12/07/2012 Gender: F Age: 62 Height: 162.6cm Weight: 59.8kg BSA: 1.73m^2 Pt. Status: Room:  PERFORMING [employer], [facility], Marcellene Shivley SONOGRAPHER Melissa Morford, RDCS cc:  ------------------------------------------------------------ LV EF: 40%  ------------------------------------------------------------ Indications: Neoplasm - breast 174.9.  ------------------------------------------------------------ History: Risk factors: Hypertension.  ------------------------------------------------------------ Study Conclusions  - Left ventricle: The cavity size was mildly dilated. Wall thickness was normal. The estimated ejection fraction was 40%. Diffuse hypokinesis. - Aortic valve: Trivial regurgitation. - Atrial septum: No defect or patent foramen ovale was identified. Transthoracic echocardiography. M-mode, limited 2D, limited spectral Doppler, and color Doppler. Height: Height: 162.6cm. Height: 64in. Weight: Weight: 59.8kg. Weight: 131.6lb. Body mass index: BMI: 22.6kg/m^2. Body surface area: BSA: 1.14m^2. Blood pressure: 103/67. Patient status: Outpatient. Location: Echo  laboratory.    ASSESSMENT: 62 y.o.  [facility] woman with stage IV breast cancer.  1. 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 2. Status post 4 cycles of adjuvant docetaxel, carboplatin and trastuzumab completed November 2007. 3. Postmastectomy radiation completed February 2008. 4. Anastrozole begun February 2008 (the trastuzumab also was continued to complete a year), continued until June 2012.  5. 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 with left retrocrural and retroperitoneal adenopathy.  6. On tamoxifen/lapatinib/trastuzumab between June 2012 and October 2012.  7. Ixempra/lapatinib/trastuzumab started October 2012, the Ixempra discontinued in February 2013 due to peripheral neuropathy. 8. Continuing on lapatinib/trastuzumab with the addition of fulvestrant, first injections given on 07/31/2011. Lapatinib given at a dose of 1 g (4 tablets) daily, and trastuzumab given at 8 mg/kg every 4 weeks to coordinate with injection appointments. 9. Discontinued lapatinib and fulvestrant in December 2013 10. Started q. three-week docetaxel/ trastuzumab/ pertuzumab, first dose given 06/15/2012.  Docetaxel given at a 50% dose reduction beginning with cycle 2 due to poor tolerance and severe neutropenia. Completed 6 cycles as of 09/28/2012.  11. Trastuzumab/ pertuzumab continued  to June of 2014, with evidence of disease progression. Letrozole added 10/19/2012. 12. Bilateral hydronephrosis noted March 2014, bilateral ureteral stenting performed 09/11/2012  under Dr Brunilda Payor 13. Herpes Zoster June 2014, resolved 14. 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  An echocardiogram on 12/07/2012 showed a reduced ejection fraction of 40%, likely secondary to previous  treatment with trastuzumab. Subsequently, TDM-1 was discontinued. 15. Starting on treatment with carboplatin/gemcitabine, both agents to be given on days one and 8 of each 21 day cycle, first dose on 12/21/2012.  Carboplatin will be given at an AUC of 2, the gemcitabine at 1000 mg per meter square. The plan is to complete 3 cycles, then restage.    PLAN:  [name] and I spoke with the patient together today to discuss her treatment options.  Over one hour was spent with the patient and her husband today, the majority of which was spent counseling her regarding her disease process, prognosis, and options for treatment. We reviewed not only her recent brain MRI results (which as detailed above were favorable) but also her recent echocardiogram results. Of course we will be holding Kadcyla for now, and it has been decided that she will proceed with carboplatin/gemcitabine, both agents given on days one and 8 of each 21 day cycle. Of course Nura was given information regarding this regimen in writing today, including her antinausea regimen which will include metoclopramide and ondansetron.   She is scheduled for chemotherapy next week on July 7, and we will initiate day 1 cycle 1 at that time. I will see her one week later, 12/28/2012, for assessment of chemotoxicity and initiation of day 8 cycle 1.  I am scheduling Deretha out for the first 3 cycles (6 total doses) of carboplatin/gemcitabine. Tentatively, we are planning on restaging in late August, and she will see [name] to review those results in early September.  I will mention that they are planning a trip to [facility] between August 6 and August 12. As we get closer to that day, we will make the decision whether or not to treat or to hold treatment on August 4 prior to [place] leaving town.  I will mention that Shauni is already scheduled for repeat echocardiogram with Dr. Gala Pugh on 01/07/2013. Her blood pressures have been very low, and Dr.  Darnelle Catalan have suggested that she hold her losartan.  With regards to her anorexia, we have discussed dietary measures such as supplementing with Boost or Ensure. I am also starting her on a morning dose of prednisone, 5 mg with breakfast to see if this helps with both her energy level and her appetite. She already has prednisone on hand at home from previous chemotherapy regimens.  I am also ordering a brace for the right foot to help correct the right foot drop. Hopefully this will help her with ambulation and will help prevent her from tripping and falling.  Dorsie and Rebecca Pugh both voice understanding and agreement with this plan thus far. As always, they'll call with any changes or problems.     Maleiah Dula    12/17/2012   ADDENDUM: I went over the recent echo report. Letia and Rebecca Pugh understand we cannot go to anti-HER-2 therapy at this point, but that frequently these drops in ejection fraction are transient, and we may be able to get back to TDM 1 once the heart muscle recovers.   In the meantime we are trying carboplatin and gemcitabine. We have had a very hard time getting a response in [place] situation, and she has also not been able to tolerate chemotherapy at full doses. I am hopeful we can get her through  3 cycles of these 2 agents and that on restaging we will document a response. Hopefully by then we may have an improved ejection fraction and may be able to go back to TDM 1 (after 2-6 months of the current chemotherapy, if it is effective).  They understand however that I am concerned we have not obtained a response despite multiple treatments. At some point we may feel that further treatment is futile. However that is not the situation we are and right now.  I personally saw this patient and performed a substantive portion of this encounter with the listed APP documented above.   Lowella Dell, MD

## 2012-12-21 ENCOUNTER — Encounter: Payer: Self-pay | Admitting: Oncology

## 2012-12-21 ENCOUNTER — Ambulatory Visit (HOSPITAL_BASED_OUTPATIENT_CLINIC_OR_DEPARTMENT_OTHER): Payer: Medicare Other

## 2012-12-21 ENCOUNTER — Other Ambulatory Visit: Payer: Self-pay | Admitting: *Deleted

## 2012-12-21 ENCOUNTER — Other Ambulatory Visit (HOSPITAL_BASED_OUTPATIENT_CLINIC_OR_DEPARTMENT_OTHER): Payer: Medicare Other

## 2012-12-21 ENCOUNTER — Telehealth: Payer: Self-pay | Admitting: *Deleted

## 2012-12-21 VITALS — BP 115/72 | HR 66 | Temp 98.0°F | Resp 20

## 2012-12-21 DIAGNOSIS — C787 Secondary malignant neoplasm of liver and intrahepatic bile duct: Secondary | ICD-10-CM

## 2012-12-21 DIAGNOSIS — C50219 Malignant neoplasm of upper-inner quadrant of unspecified female breast: Secondary | ICD-10-CM

## 2012-12-21 DIAGNOSIS — Z5111 Encounter for antineoplastic chemotherapy: Secondary | ICD-10-CM

## 2012-12-21 DIAGNOSIS — C773 Secondary and unspecified malignant neoplasm of axilla and upper limb lymph nodes: Secondary | ICD-10-CM

## 2012-12-21 DIAGNOSIS — C50912 Malignant neoplasm of unspecified site of left female breast: Secondary | ICD-10-CM

## 2012-12-21 DIAGNOSIS — C801 Malignant (primary) neoplasm, unspecified: Secondary | ICD-10-CM

## 2012-12-21 LAB — CBC WITH DIFFERENTIAL/PLATELET
BASO%: 0.8 % (ref 0.0–2.0)
EOS%: 3.5 % (ref 0.0–7.0)
HCT: 32.1 % — ABNORMAL LOW (ref 34.8–46.6)
MCH: 28.7 pg (ref 25.1–34.0)
MCHC: 32.1 g/dL (ref 31.5–36.0)
MONO#: 0.7 10*3/uL (ref 0.1–0.9)
NEUT%: 68.1 % (ref 38.4–76.8)
RBC: 3.59 10*6/uL — ABNORMAL LOW (ref 3.70–5.45)
RDW: 14 % (ref 11.2–14.5)
WBC: 7.1 10*3/uL (ref 3.9–10.3)
lymph#: 1.3 10*3/uL (ref 0.9–3.3)
nRBC: 0 % (ref 0–0)

## 2012-12-21 LAB — COMPREHENSIVE METABOLIC PANEL (CC13)
ALT: 15 U/L (ref 0–55)
AST: 26 U/L (ref 5–34)
Albumin: 2.9 g/dL — ABNORMAL LOW (ref 3.5–5.0)
Alkaline Phosphatase: 83 U/L (ref 40–150)
BUN: 25.5 mg/dL (ref 7.0–26.0)
Chloride: 102 mEq/L (ref 98–109)
Potassium: 3.9 mEq/L (ref 3.5–5.1)

## 2012-12-21 MED ORDER — SODIUM CHLORIDE 0.9 % IV SOLN
144.0000 mg | Freq: Once | INTRAVENOUS | Status: AC
  Administered 2012-12-21: 140 mg via INTRAVENOUS
  Filled 2012-12-21: qty 14

## 2012-12-21 MED ORDER — ONDANSETRON 8 MG/50ML IVPB (CHCC)
8.0000 mg | Freq: Once | INTRAVENOUS | Status: AC
  Administered 2012-12-21: 8 mg via INTRAVENOUS

## 2012-12-21 MED ORDER — SODIUM CHLORIDE 0.9 % IJ SOLN
10.0000 mL | INTRAMUSCULAR | Status: DC | PRN
  Administered 2012-12-21: 10 mL
  Filled 2012-12-21: qty 10

## 2012-12-21 MED ORDER — SODIUM CHLORIDE 0.9 % IV SOLN
1000.0000 mg/m2 | Freq: Once | INTRAVENOUS | Status: AC
  Administered 2012-12-21: 1634 mg via INTRAVENOUS
  Filled 2012-12-21: qty 43

## 2012-12-21 MED ORDER — HEPARIN SOD (PORK) LOCK FLUSH 100 UNIT/ML IV SOLN
500.0000 [IU] | Freq: Once | INTRAVENOUS | Status: AC | PRN
  Administered 2012-12-21: 500 [IU]
  Filled 2012-12-21: qty 5

## 2012-12-21 MED ORDER — DEXAMETHASONE SODIUM PHOSPHATE 10 MG/ML IJ SOLN
10.0000 mg | Freq: Once | INTRAMUSCULAR | Status: AC
  Administered 2012-12-21: 10 mg via INTRAVENOUS

## 2012-12-21 MED ORDER — SODIUM CHLORIDE 0.9 % IV SOLN
Freq: Once | INTRAVENOUS | Status: AC
  Administered 2012-12-21: 12:00:00 via INTRAVENOUS

## 2012-12-21 NOTE — Patient Instructions (Addendum)
[facility] Discharge Instructions for Patients Receiving Chemotherapy  Today you received the following chemotherapy agents: gemzar, carboplatin   To help prevent nausea and vomiting after your treatment, we encourage you to take your nausea medication.  Take it as often as prescribed.     If you develop nausea and vomiting that is not controlled by your nausea medication, call the clinic. If it is after clinic hours your family physician or the after hours number for the clinic or go to the Emergency Department.   BELOW ARE SYMPTOMS THAT SHOULD BE REPORTED IMMEDIATELY:  *FEVER GREATER THAN 100.5 F  *CHILLS WITH OR WITHOUT FEVER  NAUSEA AND VOMITING THAT IS NOT CONTROLLED WITH YOUR NAUSEA MEDICATION  *UNUSUAL SHORTNESS OF BREATH  *UNUSUAL BRUISING OR BLEEDING  TENDERNESS IN MOUTH AND THROAT WITH OR WITHOUT PRESENCE OF ULCERS  *URINARY PROBLEMS  *BOWEL PROBLEMS  UNUSUAL RASH Items with * indicate a potential emergency and should be followed up as soon as possible.  Feel free to call the clinic you have any questions or concerns. The clinic phone number is [phone].   I have been informed and understand all the instructions given to me. I know to contact the clinic, my physician, or go to the Emergency Department if any problems should occur. I do not have any questions at this time, but understand that I may call the clinic during office hours   should I have any questions or need assistance in obtaining follow up care.    __________________________________________  _____________  __________ Signature of Patient or Authorized Representative            Date                   Time    __________________________________________ Nurse's Signature   Carboplatin injection What is this medicine? CARBOPLATIN (KAR boe pla tin) is a chemotherapy drug. It targets fast dividing cells, like cancer cells, and causes these cells to die. This medicine is used to  treat ovarian cancer and many other cancers. This medicine may be used for other purposes; ask your health care provider or pharmacist if you have questions. What should I tell my health care provider before I take this medicine? They need to know if you have any of these conditions: -blood disorders -hearing problems -kidney disease -recent or ongoing radiation therapy -an unusual or allergic reaction to carboplatin, cisplatin, other chemotherapy, other medicines, foods, dyes, or preservatives -pregnant or trying to get pregnant -breast-feeding How should I use this medicine? This drug is usually given as an infusion into a vein. It is administered in a hospital or clinic by a specially trained health care professional. Talk to your pediatrician regarding the use of this medicine in children. Special care may be needed. Overdosage: If you think you have taken too much of this medicine contact a poison control center or emergency room at once. NOTE: This medicine is only for you. Do not share this medicine with others. What if I miss a dose? It is important not to miss a dose. Call your doctor or health care professional if you are unable to keep an appointment. What may interact with this medicine? -medicines for seizures -medicines to increase blood counts like filgrastim, pegfilgrastim, sargramostim -some antibiotics like amikacin, gentamicin, neomycin, streptomycin, tobramycin -vaccines Talk to your doctor or health care professional before taking any of these medicines: -acetaminophen -aspirin -ibuprofen -ketoprofen -naproxen This list may not describe all possible interactions. Give  your health care provider a list of all the medicines, herbs, non-prescription drugs, or dietary supplements you use. Also tell them if you smoke, drink alcohol, or use illegal drugs. Some items may interact with your medicine. What should I watch for while using this medicine? Your condition will be  monitored carefully while you are receiving this medicine. You will need important blood work done while you are taking this medicine. This drug may make you feel generally unwell. This is not uncommon, as chemotherapy can affect healthy cells as well as cancer cells. Report any side effects. Continue your course of treatment even though you feel ill unless your doctor tells you to stop. In some cases, you may be given additional medicines to help with side effects. Follow all directions for their use. Call your doctor or health care professional for advice if you get a fever, chills or sore throat, or other symptoms of a cold or flu. Do not treat yourself. This drug decreases your body's ability to fight infections. Try to avoid being around people who are sick. This medicine may increase your risk to bruise or bleed. Call your doctor or health care professional if you notice any unusual bleeding. Be careful brushing and flossing your teeth or using a toothpick because you may get an infection or bleed more easily. If you have any dental work done, tell your dentist you are receiving this medicine. Avoid taking products that contain aspirin, acetaminophen, ibuprofen, naproxen, or ketoprofen unless instructed by your doctor. These medicines may hide a fever. Do not become pregnant while taking this medicine. Women should inform their doctor if they wish to become pregnant or think they might be pregnant. There is a potential for serious side effects to an unborn child. Talk to your health care professional or pharmacist for more information. Do not breast-feed an infant while taking this medicine. What side effects may I notice from receiving this medicine? Side effects that you should report to your doctor or health care professional as soon as possible: -allergic reactions like skin rash, itching or hives, swelling of the face, lips, or tongue -signs of infection - fever or chills, cough, sore throat,  pain or difficulty passing urine -signs of decreased platelets or bleeding - bruising, pinpoint red spots on the skin, black, tarry stools, nosebleeds -signs of decreased red blood cells - unusually weak or tired, fainting spells, lightheadedness -breathing problems -changes in hearing -changes in vision -chest pain -high blood pressure -low blood counts - This drug may decrease the number of white blood cells, red blood cells and platelets. You may be at increased risk for infections and bleeding. -nausea and vomiting -pain, swelling, redness or irritation at the injection site -pain, tingling, numbness in the hands or feet -problems with balance, talking, walking -trouble passing urine or change in the amount of urine Side effects that usually do not require medical attention (report to your doctor or health care professional if they continue or are bothersome): -hair loss -loss of appetite -metallic taste in the mouth or changes in taste This list may not describe all possible side effects. Call your doctor for medical advice about side effects. You may report side effects to FDA at 1-800-FDA-1088. Where should I keep my medicine? This drug is given in a hospital or clinic and will not be stored at home. NOTE: This sheet is a summary. It may not cover all possible information. If you have questions about this medicine, talk to your doctor, pharmacist,  or health care provider.  2012, [employer]. (09/08/2007 2:38:05 PM)  Gemcitabine injection What is this medicine? GEMCITABINE (jem SIT a been) is a chemotherapy drug. This medicine is used to treat many types of cancer like breast cancer, lung cancer, pancreatic cancer, and ovarian cancer. This medicine may be used for other purposes; ask your health care provider or pharmacist if you have questions. What should I tell my health care provider before I take this medicine? They need to know if you have any of these  conditions: -blood disorders -infection -kidney disease -liver disease -recent or ongoing radiation therapy -an unusual or allergic reaction to gemcitabine, other chemotherapy, other medicines, foods, dyes, or preservatives -pregnant or trying to get pregnant -breast-feeding How should I use this medicine? This drug is given as an infusion into a vein. It is administered in a hospital or clinic by a specially trained health care professional. Talk to your pediatrician regarding the use of this medicine in children. Special care may be needed. Overdosage: If you think you have taken too much of this medicine contact a poison control center or emergency room at once. NOTE: This medicine is only for you. Do not share this medicine with others. What if I miss a dose? It is important not to miss your dose. Call your doctor or health care professional if you are unable to keep an appointment. What may interact with this medicine? -medicines to increase blood counts like filgrastim, pegfilgrastim, sargramostim -some other chemotherapy drugs like cisplatin -vaccines Talk to your doctor or health care professional before taking any of these medicines: -acetaminophen -aspirin -ibuprofen -ketoprofen -naproxen This list may not describe all possible interactions. Give your health care provider a list of all the medicines, herbs, non-prescription drugs, or dietary supplements you use. Also tell them if you smoke, drink alcohol, or use illegal drugs. Some items may interact with your medicine. What should I watch for while using this medicine? Visit your doctor for checks on your progress. This drug may make you feel generally unwell. This is not uncommon, as chemotherapy can affect healthy cells as well as cancer cells. Report any side effects. Continue your course of treatment even though you feel ill unless your doctor tells you to stop. In some cases, you may be given additional medicines to help  with side effects. Follow all directions for their use. Call your doctor or health care professional for advice if you get a fever, chills or sore throat, or other symptoms of a cold or flu. Do not treat yourself. This drug decreases your body's ability to fight infections. Try to avoid being around people who are sick. This medicine may increase your risk to bruise or bleed. Call your doctor or health care professional if you notice any unusual bleeding. Be careful brushing and flossing your teeth or using a toothpick because you may get an infection or bleed more easily. If you have any dental work done, tell your dentist you are receiving this medicine. Avoid taking products that contain aspirin, acetaminophen, ibuprofen, naproxen, or ketoprofen unless instructed by your doctor. These medicines may hide a fever. Women should inform their doctor if they wish to become pregnant or think they might be pregnant. There is a potential for serious side effects to an unborn child. Talk to your health care professional or pharmacist for more information. Do not breast-feed an infant while taking this medicine. What side effects may I notice from receiving this medicine? Side effects that you should  report to your doctor or health care professional as soon as possible: -allergic reactions like skin rash, itching or hives, swelling of the face, lips, or tongue -low blood counts - this medicine may decrease the number of white blood cells, red blood cells and platelets. You may be at increased risk for infections and bleeding. -signs of infection - fever or chills, cough, sore throat, pain or difficulty passing urine -signs of decreased platelets or bleeding - bruising, pinpoint red spots on the skin, black, tarry stools, blood in the urine -signs of decreased red blood cells - unusually weak or tired, fainting spells, lightheadedness -breathing problems -chest pain -mouth sores -nausea and vomiting -pain,  swelling, redness at site where injected -pain, tingling, numbness in the hands or feet -stomach pain -swelling of ankles, feet, hands -unusual bleeding Side effects that usually do not require medical attention (report to your doctor or health care professional if they continue or are bothersome): -constipation -diarrhea -hair loss -loss of appetite -stomach upset This list may not describe all possible side effects. Call your doctor for medical advice about side effects. You may report side effects to FDA at 1-800-FDA-1088. Where should I keep my medicine? This drug is given in a hospital or clinic and will not be stored at home. NOTE: This sheet is a summary. It may not cover all possible information. If you have questions about this medicine, talk to your doctor, pharmacist, or health care provider.  2012, [employer]. (10/13/2007 6:45:54 PM)

## 2012-12-21 NOTE — Telephone Encounter (Signed)
Per staff phone call and POF I have schedueld appts.  JMW  

## 2012-12-22 ENCOUNTER — Telehealth: Payer: Self-pay | Admitting: *Deleted

## 2012-12-22 ENCOUNTER — Ambulatory Visit: Payer: Medicare Other

## 2012-12-22 NOTE — Telephone Encounter (Signed)
NO PROBLEMS WITH NAUSEA OR VOMITING. PT. IS EATING BUT ENCOURAGED HER TO FORCE FLUIDS. NO PROBLEMS WITH PT.'S BOWELS. SHE HAD A GOOD BOWEL MOVEMENT WITHIN THREE DAYS. NO ISSUES WITH PT.'S MOUTH. PT. HAS HAD CONSTANT PAIN IN HER LEFT SHOULDER TO HER FINGERTIPS AT A SCALE OF EIGHT FOR THREE TO FOUR MONTHS. ALSO PT.'S HAND IS NUMB. SHE DOES NOT HAVE ANY PAIN MEDICATION AND HAS NOT TRIED ANYTHING OVER THE COUNTER. THIS NOTE WAS LEFT ON DR.MAGRINAT'S NURSE'S DESK. ALSO INSTRUCTED PT. TO CALL THIS OFFICE IN THE MORNING TO SPEAK WITH DR.MAGRINAT'S NURSE. PT. VOICES UNDERSTANDING.

## 2012-12-24 ENCOUNTER — Other Ambulatory Visit: Payer: Self-pay | Admitting: Oncology

## 2012-12-24 DIAGNOSIS — M25512 Pain in left shoulder: Secondary | ICD-10-CM

## 2012-12-24 DIAGNOSIS — C50919 Malignant neoplasm of unspecified site of unspecified female breast: Secondary | ICD-10-CM

## 2012-12-25 ENCOUNTER — Ambulatory Visit (HOSPITAL_COMMUNITY)
Admission: RE | Admit: 2012-12-25 | Discharge: 2012-12-25 | Disposition: A | Discharge status: Home / Self Care | Payer: Medicare Other | Source: Ambulatory Visit | Attending: Oncology | Admitting: Oncology

## 2012-12-25 DIAGNOSIS — M67919 Unspecified disorder of synovium and tendon, unspecified shoulder: Secondary | ICD-10-CM | POA: Insufficient documentation

## 2012-12-25 DIAGNOSIS — C50919 Malignant neoplasm of unspecified site of unspecified female breast: Secondary | ICD-10-CM

## 2012-12-25 DIAGNOSIS — M719 Bursopathy, unspecified: Secondary | ICD-10-CM | POA: Insufficient documentation

## 2012-12-25 DIAGNOSIS — M25512 Pain in left shoulder: Secondary | ICD-10-CM

## 2012-12-25 DIAGNOSIS — M19019 Primary osteoarthritis, unspecified shoulder: Secondary | ICD-10-CM | POA: Insufficient documentation

## 2012-12-25 MED ORDER — GADOBENATE DIMEGLUMINE 529 MG/ML IV SOLN
6.0000 mL | Freq: Once | INTRAVENOUS | Status: AC | PRN
  Administered 2012-12-25: 6 mL via INTRAVENOUS

## 2012-12-28 ENCOUNTER — Ambulatory Visit (HOSPITAL_BASED_OUTPATIENT_CLINIC_OR_DEPARTMENT_OTHER): Payer: Medicare Other

## 2012-12-28 ENCOUNTER — Ambulatory Visit: Payer: Medicare Other | Admitting: Physician Assistant

## 2012-12-28 ENCOUNTER — Other Ambulatory Visit (HOSPITAL_BASED_OUTPATIENT_CLINIC_OR_DEPARTMENT_OTHER): Payer: Medicare Other | Admitting: Lab

## 2012-12-28 ENCOUNTER — Encounter: Payer: Self-pay | Admitting: Physician Assistant

## 2012-12-28 ENCOUNTER — Ambulatory Visit (HOSPITAL_BASED_OUTPATIENT_CLINIC_OR_DEPARTMENT_OTHER): Payer: Medicare Other | Admitting: Physician Assistant

## 2012-12-28 VITALS — BP 94/61 | HR 74 | Temp 98.2°F | Resp 20 | Ht 64.0 in | Wt 126.8 lb

## 2012-12-28 DIAGNOSIS — C7970 Secondary malignant neoplasm of unspecified adrenal gland: Secondary | ICD-10-CM

## 2012-12-28 DIAGNOSIS — M25519 Pain in unspecified shoulder: Secondary | ICD-10-CM

## 2012-12-28 DIAGNOSIS — F419 Anxiety disorder, unspecified: Secondary | ICD-10-CM

## 2012-12-28 DIAGNOSIS — C801 Malignant (primary) neoplasm, unspecified: Secondary | ICD-10-CM

## 2012-12-28 DIAGNOSIS — C50219 Malignant neoplasm of upper-inner quadrant of unspecified female breast: Secondary | ICD-10-CM

## 2012-12-28 DIAGNOSIS — C50912 Malignant neoplasm of unspecified site of left female breast: Secondary | ICD-10-CM

## 2012-12-28 DIAGNOSIS — C773 Secondary and unspecified malignant neoplasm of axilla and upper limb lymph nodes: Secondary | ICD-10-CM

## 2012-12-28 DIAGNOSIS — M21371 Foot drop, right foot: Secondary | ICD-10-CM

## 2012-12-28 DIAGNOSIS — Z5111 Encounter for antineoplastic chemotherapy: Secondary | ICD-10-CM

## 2012-12-28 DIAGNOSIS — M25512 Pain in left shoulder: Secondary | ICD-10-CM

## 2012-12-28 DIAGNOSIS — R931 Abnormal findings on diagnostic imaging of heart and coronary circulation: Secondary | ICD-10-CM

## 2012-12-28 LAB — CBC WITH DIFFERENTIAL/PLATELET
Basophils Absolute: 0 10*3/uL (ref 0.0–0.1)
EOS%: 2.5 % (ref 0.0–7.0)
Eosinophils Absolute: 0.1 10*3/uL (ref 0.0–0.5)
HCT: 30.2 % — ABNORMAL LOW (ref 34.8–46.6)
HGB: 9.9 g/dL — ABNORMAL LOW (ref 11.6–15.9)
MCH: 28.7 pg (ref 25.1–34.0)
MCV: 87.5 fL (ref 79.5–101.0)
MONO%: 10.2 % (ref 0.0–14.0)
NEUT#: 1.8 10*3/uL (ref 1.5–6.5)
NEUT%: 50 % (ref 38.4–76.8)
lymph#: 1.3 10*3/uL (ref 0.9–3.3)

## 2012-12-28 LAB — COMPREHENSIVE METABOLIC PANEL (CC13)
Albumin: 3.4 g/dL — ABNORMAL LOW (ref 3.5–5.0)
Alkaline Phosphatase: 138 U/L (ref 40–150)
BUN: 24 mg/dL (ref 7.0–26.0)
CO2: 24 mEq/L (ref 22–29)
Glucose: 103 mg/dl (ref 70–140)
Potassium: 3.9 mEq/L (ref 3.5–5.1)
Total Protein: 7.4 g/dL (ref 6.4–8.3)

## 2012-12-28 MED ORDER — SODIUM CHLORIDE 0.9 % IV SOLN
144.0000 mg | Freq: Once | INTRAVENOUS | Status: AC
  Administered 2012-12-28: 140 mg via INTRAVENOUS
  Filled 2012-12-28: qty 14

## 2012-12-28 MED ORDER — OXYCODONE HCL 5 MG PO TABS: 5.0000 mg | ORAL_TABLET | Freq: Four times a day (QID) | ORAL | Status: DC | PRN

## 2012-12-28 MED ORDER — HEPARIN SOD (PORK) LOCK FLUSH 100 UNIT/ML IV SOLN
500.0000 [IU] | Freq: Once | INTRAVENOUS | Status: AC | PRN
  Administered 2012-12-28: 500 [IU]
  Filled 2012-12-28: qty 5

## 2012-12-28 MED ORDER — ONDANSETRON 8 MG/50ML IVPB (CHCC)
8.0000 mg | Freq: Once | INTRAVENOUS | Status: AC
  Administered 2012-12-28: 8 mg via INTRAVENOUS

## 2012-12-28 MED ORDER — DEXAMETHASONE SODIUM PHOSPHATE 10 MG/ML IJ SOLN
10.0000 mg | Freq: Once | INTRAMUSCULAR | Status: AC
  Administered 2012-12-28: 10 mg via INTRAVENOUS

## 2012-12-28 MED ORDER — SODIUM CHLORIDE 0.9 % IJ SOLN
10.0000 mL | INTRAMUSCULAR | Status: DC | PRN
  Administered 2012-12-28: 10 mL
  Filled 2012-12-28: qty 10

## 2012-12-28 MED ORDER — SODIUM CHLORIDE 0.9 % IV SOLN
Freq: Once | INTRAVENOUS | Status: AC
  Administered 2012-12-28: 15:00:00 via INTRAVENOUS

## 2012-12-28 MED ORDER — SODIUM CHLORIDE 0.9 % IV SOLN
1000.0000 mg/m2 | Freq: Once | INTRAVENOUS | Status: AC
  Administered 2012-12-28: 1634 mg via INTRAVENOUS
  Filled 2012-12-28: qty 43

## 2012-12-28 NOTE — Progress Notes (Signed)
ID: [name]   DOB: 1951-03-26  MR#: [id number]  CSN#:[id number]  PCP: [name], MD GYN: SU: [name] OTHER MD: [name]  HISTORY OF PRESENT ILLNESS: The patient and her husband, Gene, moved back to this area recently from [facility] (Gene actually is a native of [place] and incidentally remains a [employer] fan).  As part of moving boxes, Ms. Menn felt something under her left arm.  She brought it to [name] attention and although the patient states that she has always had some cyclical swelling of her left axilla during menstruation, [name] set the patient up for evaluation at [facility].  On 12-12-05 the patient had a mammogram and ultrasound there and these showed no mass, distortion or microcalcifications in either breast however, in the left axilla there was a large macrolobulated mass and just above that there was a slightly prominent lymph node which did demonstrate a fatty hilum.  Physically on exam the mass was mobile and nontender and measured approximately 6.5 cm.  The ultrasound showed two small hypoechoic nodules in the left breast 3 cm. from the nipple measuring 7 mm. each and felt to be most likely benign however, the axillary mass, of course, was quite suspicious and biopsy was performed the same day.  This showed ([id number] and [id number]) invasive breast cancer which was strongly ER positive at 76%, moderately PR positive at 25% with a very high proliferation marker at 66%.  HER-2/neu was 1+ and FISH showed no amplification with a ratio of 1.1.    With this information, the patient was referred to Dr. [name] and on 12-20-05 the patient had bilateral breast MRI's.  In the left breast there was a 5.8 cm. axillary mass and several abnormally enlarged lymph nodes.  There was a 7 mm. left supraclavicular lymph node and the two nodules in the breast that had been noted by ultrasound were also noted by MRI measuring 1.1 and 0.7 cm.  In the right breast there  were no abnormalities noted.  MR guided biopsies of the left breast masses were obtained on 12-27-05 and both showed high grade ductal carcinoma.  There was evidence of lymphovascular invasion and high grade ductal carcinoma in situ as well ([id number]).    [name] discussed the situation with me and we felt it would be useful to have a PET scan prior to definitive surgery.  This was obtained on 12-30-05 and showed the primary axillary mass to have an SUV of 8.5.  The lymph nodes in the left axilla had SUV's in the 2.2 range, small focus of increased activity in the left breast had an SUV of 2.3.  There was also skin thickening with mildly increased diffuse FDG uptake within the skin suggesting an inflammatory breast cancer.  The neck, abdomen and  pelvis were negative.  In the lung windows, there were several scattered small areas of focal nodular density in the upper lobes with compressive atelectasis and this was felt to be benign.  Chest x-ray on 01-09-06 was negative.  With this information, [name] proceeded, after appropriate discussion, to left modified radical mastectomy on 01-15-06 and the insertion of a BARD port at the same time.  The final pathology ([id number]) showed the maximum tumor size to be 5 cm.  Margins were ample.  There was evidence of lymphovascular invasion.  This infiltrating ductal carcinoma was a grade 3 of 3 and 11 out of 14 lymph nodes were involved.  There was evidence of  extracapsular extension.  Her subsequent history is as detailed below.   INTERVAL HISTORY: [name] returns today with her husband Gene for followup of her metastatic breast carcinoma. She's currently receiving carboplatin/gemcitabine, both agents given on days one and 8 of each 21 day cycle, and is due for day 8 cycle 1 today.   She tolerated treatment last week quite well with few side effects. She had no problems with nausea or emesis. Her appetite has improved. Her biggest complaint this past week has  been severe left shoulder pain which was evaluated with a shoulder MRI. These results are detailed below, but the MRI showed no evidence of metastatic disease. She took oxycodone effectively for the pain. Stepheni tells me that over the weekend, the pain has decreased significantly, and she also notices a decrease in the adenopathy in the left supraclavicular region. She's having less numbness in the left upper extremity as well.  She continues to have some problems with right footdrop, and is in the process of obtaining a brace for the right foot as recommended by [name].  REVIEW OF SYSTEMS: Avanni has had no fevers or chills. She denies any skin changes or rashes. She's had no abnormal bleeding. She denies any abnormal headaches and has had no recent dizziness or change in vision. She's having regular bowel movements. She's had no increased cough or phlegm production. She does have shortness of breath with exertion but this has not worsened. She denies any orthopnea. She's had no chest pain or palpitations. She denies any peripheral swelling.   A detailed review of systems is otherwise stable.     PAST MEDICAL HISTORY: Past Medical History  Diagnosis Date  . Hypertension   . Depression   . Hydronephrosis, bilateral   . Breast cancer metastasized to multiple sites POSITIVE METS IN 2012-----  ONCOLOGIST--  DR Darnelle Catalan--  CURRENTLY RECEIVING CHEMO  EVERY 3 WEEKS (STARTED 06-15-2012)    FIRST DX 01-15-2006, INVASIVE DUCTAL GRADE III/    S/P LEFT MASTECTOMY AND CHEMORADIATION (CHEMO COMPLETE NOV 2007 AND XRT COMPLETE FEB 2008)  remote tonsillectomy, remote cholecystectomy, status post appendectomy, multiple skin biopsies for what the patient said were simple keratoses, and status post port placement.  FAMILY HISTORY: The patient has no information regarding her father. She was brought up by her stepfather. The patient's mother died at the age of 70 with bladder cancer. The patient has one sister  who is in good health. There is no history of breast or ovarian cancer in the family to the patient's knowledge.   GYNECOLOGIC HISTORY: She is GX, P2. She was premenopausal at the time of her initial breast cancer diagnosis  SOCIAL HISTORY: She and her husband, Gene, have been married 10 years. 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 She has two children, a daughter, [name], who lives in [place] and is a Investment banker, corporate, and a son, [name], also in [place], who works for [employer]. She has two grandchildren and three step-grandchildren from her own two children, [name] and [name], who are from her first marriage. Gene has one daughter from his first marriage and he has grandchildren through her. The patient was brought up as a Catholic but currently is not practicing. She and Gene are attending a [employer].      ADVANCED DIRECTIVES:  HEALTH MAINTENANCE: History  Substance Use Topics  . Smoking status: Former Smoker -- 20 years  Quit date: 01/31/1995  . Smokeless tobacco: Never Used  . Alcohol Use: Yes     Comment: occasionl     Colonoscopy:  PAP:  Bone density: August 2012, "Normal"  Lipid panel:  No Known Allergies  Current Outpatient Prescriptions  Medication Sig Dispense Refill  . calcium citrate (CALCITRATE - DOSED IN MG ELEMENTAL CALCIUM) 950 MG tablet Take 1 tablet by mouth daily.       . carvedilol (COREG) 3.125 MG tablet Take 1 tablet (3.125 mg total) by mouth 2 (two) times daily with a meal.  60 tablet  3  . cholecalciferol (VITAMIN D) 1000 UNITS tablet Take 3,000 Units by mouth daily.       . cholestyramine (QUESTRAN) 4 G packet Take 1 packet by mouth 2 (two) times daily with a meal. Mix with water.      . Desloratadine-Pseudoephedrine (CLARINEX-D 24 HOUR PO) Take 1 capsule by mouth daily.      Marland Kitchen FLUoxetine (PROZAC) 40 MG capsule TAKE ONE CAPSULE BY MOUTH ONCE DAILY  30  capsule  6  . gabapentin (NEURONTIN) 300 MG capsule Take 1 capsule (300 mg total) by mouth at bedtime.  30 capsule  5  . letrozole (FEMARA) 2.5 MG tablet       . lidocaine (XYLOCAINE) 5 % ointment Apply topically 2 (two) times daily as needed.  30 g  1  . lidocaine-prilocaine (EMLA) cream Apply topically as needed.  30 g  3  . loperamide (IMODIUM) 2 MG capsule Take 2 mg by mouth 4 (four) times daily as needed. diarrhea      . LORazepam (ATIVAN) 0.5 MG tablet Take 1 tablet (0.5 mg total) by mouth 2 (two) times daily as needed.  60 tablet  0  . losartan (COZAAR) 50 MG tablet Take 0.5 tablets (25 mg total) by mouth daily.  30 tablet  6  . metoCLOPramide (REGLAN) 10 MG tablet Take 0.5 tablets (5 mg total) by mouth 4 (four) times daily -  before meals and at bedtime.  120 tablet  12  . omeprazole (PRILOSEC) 40 MG capsule Take 1 capsule (40 mg total) by mouth at bedtime.  30 capsule  12  . ondansetron (ZOFRAN) 8 MG tablet Take 8 mg by mouth 2 (two) times daily as needed for nausea.      Marland Kitchen oxybutynin (DITROPAN) 5 MG tablet       . oxyCODONE (OXY IR/ROXICODONE) 5 MG immediate release tablet Take 1 tablet (5 mg total) by mouth every 6 (six) hours as needed.  30 tablet  0  . potassium chloride (MICRO-K) 10 MEQ CR capsule Take 2 capsules (20 mEq total) by mouth daily.  60 capsule  1  . predniSONE (DELTASONE) 5 MG tablet Take 5 mg by mouth daily. Take x 4 days after discontinuing dexamethasone.-- TO START ON 09-12-2012      . prochlorperazine (COMPAZINE) 10 MG tablet Take 1 tablet (10 mg total) by mouth every 6 (six) hours as needed.  30 tablet  3  . tobramycin-dexamethasone (TOBRADEX) ophthalmic solution Place 1 drop into both eyes 2 (two) times daily.  5 mL  1  . valACYclovir (VALTREX) 1000 MG tablet Take 1 tablet (1,000 mg total) by mouth 2 (two) times daily.  28 tablet  0   No current facility-administered medications for this visit.   Facility-Administered Medications Ordered in Other Visits   Medication Dose Route Frequency Provider Last Rate Last Dose  . heparin lock flush 100 unit/mL  500 Units Intravenous  Once Lowella Dell, MD      . sodium chloride 0.9 % injection 10 mL  10 mL Intravenous PRN Lowella Dell, MD         OBJECTIVE:  Filed Vitals:   12/28/12 1419  BP: 94/61  Pulse: 74  Temp: 98.2 F (36.8 C)  Resp: 20  Body mass index is 21.75 kg/(m^2). Filed Weights   12/28/12 1419  Weight: 126 lb 12.8 oz (57.516 kg)  Middle-aged white female who appears slightly uncomfortable but is upbeat and in no acute distress. ECOG: 2  Sclerae unicteric Oropharynx clear,  no ulcerations. Buccal mucosa is slightly dry. Easily palpated bilateral cervical and supraclavicular adenopathy, left greater than right. The adenopathy on the left, however, does appears to be protruding slightly less than with previous exam, and perhaps is a little softer as well. Lungs clear to auscultation, no rales or rhonchi Heart regular rate and rhythm Abdomen nontender, positive bowel sounds MSK  no peripheral edema, right foot drop which is affecting gait Neuro: nonfocal, well oriented, appropriate affect Breasts: Deferred.     LAB RESULTS:  Lab Results  Component Value Date   WBC 3.5* 12/28/2012   NEUTROABS 1.8 12/28/2012   HGB 9.9* 12/28/2012   HCT 30.2* 12/28/2012   MCV 87.5 12/28/2012   PLT 188 12/28/2012      Chemistry      Component Value Date/Time   NA 139 12/21/2012 1102   NA 140 10/20/2012 1113   NA 142 11/05/2010 1036   K 3.9 12/21/2012 1102   K 4.6 10/20/2012 1113   K 4.5 11/05/2010 1036   CL 99 11/30/2012 0835   CL 102 02/01/2012 0500   CL 99 11/05/2010 1036   CO2 29 12/21/2012 1102   CO2 25 02/01/2012 0500   CO2 26 11/05/2010 1036   BUN 25.5 12/21/2012 1102   BUN 13 02/01/2012 0500   BUN 14 11/05/2010 1036   CREATININE 1.3* 12/21/2012 1102   CREATININE 0.97 02/01/2012 0500   CREATININE 0.8 11/05/2010 1036      Component Value Date/Time   CALCIUM 9.5 12/21/2012 1102   CALCIUM 8.6  02/01/2012 0500   CALCIUM 8.8 11/05/2010 1036   ALKPHOS 83 12/21/2012 1102   ALKPHOS 88 01/08/2012 0916   ALKPHOS 106* 11/05/2010 1036   AST 26 12/21/2012 1102   AST 24 01/08/2012 0916   AST 27 11/05/2010 1036   ALT 15 12/21/2012 1102   ALT 27 01/08/2012 0916   BILITOT 0.41 12/21/2012 1102   BILITOT 0.9 01/08/2012 0916   BILITOT 0.80 11/05/2010 1036       Lab Results  Component Value Date   LABCA2 107* 08/17/2012    STUDIES:  Mr Laqueta Jean ZO Contrast  12/05/2012   *RADIOLOGY REPORT*  Clinical Data: Breast cancer.  Newly discovered liver metastases.  MRI HEAD WITHOUT AND WITH CONTRAST  Technique:  Multiplanar, multiecho pulse sequences of the brain and surrounding structures were obtained according to standard protocol without and with intravenous contrast  Contrast: 6mL MULTIHANCE GADOBENATE DIMEGLUMINE 529 MG/ML IV SOLN  Comparison: CT head cervical 11/09/2011. MRI brain 12/04/2010.  Findings: No acute stroke or hemorrhage.  No mass lesion or hydrocephalus.  No extra-axial fluid.  Normal cerebral volume. Minor white matter changes are suspected in the periventricular greater than subcortical regions, also in the mid pons, suggesting chronic microvascular ischemic change and/or post treatment effect. There is no midline shift.  Flow voids are maintained in the major intracranial vessels.  Post  infusion, there is no abnormal enhancement of the brain or meninges. There are no definite osseous metastases.  No skull base or upper cervical lesions.  Normal midline structures. The orbits, sinuses, and mastoids appear unremarkable. Pathologic cervical adenopathy is incompletely evaluated on this brain exam.  Prominence of the left jugular bulb may reflect nodal disease in the neck.  IMPRESSION: Unremarkable cranial MRI. Minor changes of small vessel disease. No evidence intracranial metastatic disease.   Original Report Authenticated By: Davonna Belling, M.D.   Mr Shoulder Left W Contrast  12/26/2012   *RADIOLOGY  REPORT*  Clinical Data: Left shoulder pain.  History of breast cancer.  MRI LEFT SHOULDER WITH CONTRAST  Technique:  Multiplanar, multisequence MR imaging of the left shoulder was performed following the administration of intravenous contrast.  Contrast: 6mL MULTIHANCE GADOBENATE DIMEGLUMINE 529 MG/ML IV SOLN  Comparison:  CT scan chest, abdomen and pelvis 11/17/2012.  Findings:  There is no evidence of metastatic disease.  Mildly increased T2 signal in the supraspinatus and infraspinatus tendons consistent with tendinopathy is identified.  The tendons are intact.  The subscapularis appears normal.  The long head of biceps tendon is intact.  The labrum is unremarkable.  The patient has moderate acromioclavicular degenerative change.  The acromion is type 2.  A small amount of fluid is present in the subacromial/subdeltoid bursa.  IMPRESSION:  1.  Mild to moderate appearing supraspinatus and infraspinatus tendinopathy without tear. 2.  Acromioclavicular osteoarthritis. 3.  Small amount fluid in the subacromial/subdeltoid bursa compatible with bursitis. 4.  Negative for metastatic disease.   Original Report Authenticated By: Holley Dexter, M.D.    ASSESSMENT: 62 y.o.  [facility] woman with stage IV breast cancer.  1. 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 2. Status post 4 cycles of adjuvant docetaxel, carboplatin and trastuzumab completed November 2007. 3. Postmastectomy radiation completed February 2008. 4. Anastrozole begun February 2008 (the trastuzumab also was continued to complete a year), continued until June 2012.  5. 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 with left retrocrural and retroperitoneal adenopathy.  6. On  tamoxifen/lapatinib/trastuzumab between June 2012 and October 2012.  7. Ixempra/lapatinib/trastuzumab started October 2012, the Ixempra discontinued in February 2013 due to peripheral neuropathy. 8. Continuing on lapatinib/trastuzumab with the addition of fulvestrant, first injections given on 07/31/2011. Lapatinib given at a dose of 1 g (4 tablets) daily, and trastuzumab given at 8 mg/kg every 4 weeks to coordinate with injection appointments. 9. Discontinued lapatinib and fulvestrant in December 2013 10. Started q. three-week docetaxel/ trastuzumab/ pertuzumab, first dose given 06/15/2012.  Docetaxel given at a 50% dose reduction beginning with cycle 2 due to poor tolerance and severe neutropenia. Completed 6 cycles as of 09/28/2012.  11. Trastuzumab/ pertuzumab continued  to June of 2014, with evidence of disease progression. Letrozole added 10/19/2012. 12. Bilateral hydronephrosis noted March 2014, bilateral ureteral stenting performed 09/11/2012 under Dr Brunilda Payor 13. Herpes Zoster June 2014, resolved 14. 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  An echocardiogram on 12/07/2012 showed a reduced ejection fraction of 40%, likely secondary to previous treatment with trastuzumab. Subsequently, TDM-1 was discontinued. 15. Starting on treatment with carboplatin/gemcitabine, both agents to be given on days one and 8 of each 21 day cycle, first dose on 12/21/2012.  Carboplatin will be given at an  AUC of 2, the gemcitabine at 1000 mg per meter square. The plan is to complete 3 cycles, then restage.    PLAN:  Tashia is encouraged by how well she tolerated her first dose of carboplatin/gemcitabine, and she'll proceed to treatment today as scheduled for day 8 cycle 1. We'll repeat labs again next week on July 21, and I will see her in 2 weeks on July 28 in anticipation of day 1 cycle 2.  Tentatively, we are planning on restaging in late August when she has completed 3 cycles (6 doses) of  carboplatin/gemcitabine, and she will see [name] to review those results in early September.  I will mention that they are planning a trip to [facility] between August 6 and August 12. As we get closer to that day, we will make the decision whether or not to treat or to hold treatment on August 4 prior to Crowder leaving town.  I will mention that Bristal is already scheduled for repeat echocardiogram with Dr. Gala Romney on 01/07/2013. Her blood pressures have been very low, and [name] have suggested that she hold her losartan.  I have given Bonita Quin a prescription for oxycodone, 5 mg, to take one tablet every 6 hours as needed for pain, primarily pain involving the left shoulder. She is still working on getting a brace for the right foot drop, but continues to have difficulty walking. I've also written orders for a handicapped parking past.  Bonita Quin and Gene both voice understanding and agreement with this plan thus far. As always, they'll call with any changes or problems.     Rodnesha Elie    12/28/2012

## 2012-12-28 NOTE — Patient Instructions (Addendum)
[facility] Discharge Instructions for Patients Receiving Chemotherapy  Today you received the following chemotherapy agents Gemzar/Carboplatin.  To help prevent nausea and vomiting after your treatment, we encourage you to take your nausea medication as prescribed.   If you develop nausea and vomiting that is not controlled by your nausea medication, call the clinic.   BELOW ARE SYMPTOMS THAT SHOULD BE REPORTED IMMEDIATELY:  *FEVER GREATER THAN 100.5 F  *CHILLS WITH OR WITHOUT FEVER  NAUSEA AND VOMITING THAT IS NOT CONTROLLED WITH YOUR NAUSEA MEDICATION  *UNUSUAL SHORTNESS OF BREATH  *UNUSUAL BRUISING OR BLEEDING  TENDERNESS IN MOUTH AND THROAT WITH OR WITHOUT PRESENCE OF ULCERS  *URINARY PROBLEMS  *BOWEL PROBLEMS  UNUSUAL RASH Items with * indicate a potential emergency and should be followed up as soon as possible.  Feel free to call the clinic you have any questions or concerns. The clinic phone number is [phone].    

## 2013-01-04 ENCOUNTER — Other Ambulatory Visit (HOSPITAL_BASED_OUTPATIENT_CLINIC_OR_DEPARTMENT_OTHER): Payer: Medicare Other

## 2013-01-04 DIAGNOSIS — C50219 Malignant neoplasm of upper-inner quadrant of unspecified female breast: Secondary | ICD-10-CM

## 2013-01-04 DIAGNOSIS — C50912 Malignant neoplasm of unspecified site of left female breast: Secondary | ICD-10-CM

## 2013-01-04 LAB — COMPREHENSIVE METABOLIC PANEL (CC13)
ALT: 34 U/L (ref 0–55)
AST: 26 U/L (ref 5–34)
Creatinine: 1.4 mg/dL — ABNORMAL HIGH (ref 0.6–1.1)
Total Bilirubin: 0.75 mg/dL (ref 0.20–1.20)

## 2013-01-04 LAB — CBC WITH DIFFERENTIAL/PLATELET
BASO%: 0.6 % (ref 0.0–2.0)
EOS%: 1.1 % (ref 0.0–7.0)
HCT: 27.2 % — ABNORMAL LOW (ref 34.8–46.6)
LYMPH%: 30.2 % (ref 14.0–49.7)
MCH: 29.6 pg (ref 25.1–34.0)
MCHC: 34.1 g/dL (ref 31.5–36.0)
MONO%: 6.1 % (ref 0.0–14.0)
NEUT%: 62 % (ref 38.4–76.8)
Platelets: 98 10*3/uL — ABNORMAL LOW (ref 145–400)

## 2013-01-07 ENCOUNTER — Encounter (HOSPITAL_COMMUNITY): Payer: Self-pay

## 2013-01-07 ENCOUNTER — Ambulatory Visit (HOSPITAL_COMMUNITY)
Admission: RE | Admit: 2013-01-07 | Discharge: 2013-01-07 | Disposition: A | Discharge status: Home / Self Care | Payer: Medicare Other | Source: Ambulatory Visit | Attending: Oncology | Admitting: Oncology

## 2013-01-07 ENCOUNTER — Ambulatory Visit (HOSPITAL_BASED_OUTPATIENT_CLINIC_OR_DEPARTMENT_OTHER)
Admission: RE | Admit: 2013-01-07 | Discharge: 2013-01-07 | Disposition: A | Discharge status: Home / Self Care | Payer: Medicare Other | Source: Ambulatory Visit | Attending: Cardiology | Admitting: Cardiology

## 2013-01-07 VITALS — BP 100/58 | HR 70 | Wt 126.8 lb

## 2013-01-07 DIAGNOSIS — I059 Rheumatic mitral valve disease, unspecified: Secondary | ICD-10-CM | POA: Insufficient documentation

## 2013-01-07 DIAGNOSIS — Z09 Encounter for follow-up examination after completed treatment for conditions other than malignant neoplasm: Secondary | ICD-10-CM

## 2013-01-07 DIAGNOSIS — C50919 Malignant neoplasm of unspecified site of unspecified female breast: Secondary | ICD-10-CM | POA: Insufficient documentation

## 2013-01-07 DIAGNOSIS — C50912 Malignant neoplasm of unspecified site of left female breast: Secondary | ICD-10-CM

## 2013-01-07 DIAGNOSIS — I10 Essential (primary) hypertension: Secondary | ICD-10-CM | POA: Insufficient documentation

## 2013-01-07 NOTE — Patient Instructions (Addendum)
Follow up in 6 months with ECHO.  Continue carvedilol at 3.125 mg BID.  Call any issues.

## 2013-01-07 NOTE — Progress Notes (Signed)
Patient ID: Kiernan Farkas, female   DOB: 09-13-1950, 62 y.o.   MRN: [id number] Referring Physician: [name] Primary Care: [name]  Primary Cardiologist: none  HPI: Mrs. Fajardo is a 62 y.o. female with history of stage IV breast cancer initially diagnosed in 2007.  She underwent 4 cycles of adjuvant paclitaxel/carboplatin/trastuzumab (completed 04/2006) and trastuzumab continued for 1 year.  She had postmastectomy radiation completed 07/2006.  Anastrozole began in 07/2006.  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  Started on tamoxifen, lapatinib, and trastuzumab between June and October of 2012, with progression.  She is now on ixabepilone, lapatinib, and trastuzumab beginning November of 2012, with fair tolerance.  She has completed four cycles of ixabepilone/lapatinib and will remain on herceptin indefinitely.  Discontinued lapatinib and fulvestrant in December 2013 Started q. three-week docetaxel/ trastuzumab/ pertuzumab, first dose given 06/15/2012 and completed 6 cycles as of 09/28/2012. Trastuzumab/ pertuzumab continued to June of 2014 and not on gemcitabine.   Echos: 11/2010: EF 60-65% with poor windows to evaluate lateral S' 03/2011: EF60-65% with lateral S' peaks of 10.1 07/10/11: EF 55-60% lateral s' 9.6 10/08/11: EF 60%, lateral s' 9.6 01/09/12: EF 60-65%, lateral s' 11.5, 11.08 05/21/12: EF 60%, lateral s' 10.9 09/08/12: EF 65-70% lateral s' 10.9 12/07/12 EF 40%  lateral s' 11.9  01/07/13 EF 60-65% lateral s' 11.6  Follow up:  Last visit her EF 40% and is no longer on trastuzumab, but gemcitabine. Her BP was soft and her losartan was cut back to 25 mg daily and carvedilol 3.125 BID  was added. Reports that [name] took her completley off losartan d/t hypotension. Reports that she still has some dizziness and feels like she is going to pass out at times. SBP at home 90-100's. Denies SOB/PND/Orthopnea. Appetite improving.   I reviewed her echo today: EF 60-65%, improved.   Review of Systems: All pertinent positives and negatives as in HPI, otherwise negative.     Past Medical History  Diagnosis Date  . Hypertension   . Depression   . Hydronephrosis, bilateral   . Breast cancer metastasized to multiple sites POSITIVE METS IN 2012-----  ONCOLOGIST--  DR Darnelle Catalan--  CURRENTLY RECEIVING CHEMO  EVERY 3 WEEKS (STARTED 06-15-2012)    FIRST DX 01-15-2006, INVASIVE DUCTAL GRADE III/    S/P LEFT MASTECTOMY AND CHEMORADIATION (CHEMO COMPLETE NOV 2007 AND XRT COMPLETE FEB 2008)    Current Outpatient Prescriptions  Medication Sig Dispense Refill  . calcium citrate (CALCITRATE - DOSED IN MG ELEMENTAL CALCIUM) 950 MG tablet Take 1 tablet by mouth daily.       . carvedilol (COREG) 3.125 MG tablet Take 1 tablet (3.125 mg total) by mouth 2 (two) times daily with a meal.  60 tablet  3  . cholecalciferol (VITAMIN D) 1000 UNITS tablet Take 3,000 Units by mouth daily.       . cholestyramine (QUESTRAN) 4 G packet Take 1 packet by mouth 2 (two) times daily with a meal. Mix with water.      . Desloratadine-Pseudoephedrine (CLARINEX-D 24 HOUR PO) Take 1 capsule by mouth daily.      Marland Kitchen FLUoxetine (PROZAC) 40 MG capsule TAKE ONE CAPSULE BY MOUTH ONCE DAILY  30 capsule  6  . gabapentin (NEURONTIN) 300 MG capsule Take 1 capsule (300 mg total) by mouth at bedtime.  30 capsule  5  . letrozole (FEMARA) 2.5 MG tablet       . lidocaine (XYLOCAINE) 5 % ointment Apply topically 2 (two) times daily as needed.  30 g  1  . lidocaine-prilocaine (EMLA) cream Apply topically as needed.  30 g  3  . loperamide (IMODIUM) 2 MG capsule Take 2 mg by mouth 4 (four) times daily as needed. diarrhea      . LORazepam  (ATIVAN) 0.5 MG tablet Take 1 tablet (0.5 mg total) by mouth 2 (two) times daily as needed.  60 tablet  0  . losartan (COZAAR) 50 MG tablet Take 0.5 tablets (25 mg total) by mouth daily.  30 tablet  6  . metoCLOPramide (REGLAN) 10 MG tablet Take 0.5 tablets (5 mg total) by mouth 4 (four) times daily -  before meals and at bedtime.  120 tablet  12  . omeprazole (PRILOSEC) 40 MG capsule Take 1 capsule (40 mg total) by mouth at bedtime.  30 capsule  12  . ondansetron (ZOFRAN) 8 MG tablet Take 8 mg by mouth 2 (two) times daily as needed for nausea.      Marland Kitchen oxybutynin (DITROPAN) 5 MG tablet       . oxyCODONE (OXY IR/ROXICODONE) 5 MG immediate release tablet Take 1 tablet (5 mg total) by mouth every 6 (six) hours as needed.  30 tablet  0  . potassium chloride (MICRO-K) 10 MEQ CR capsule Take 2 capsules (20 mEq total) by mouth daily.  60 capsule  1  . predniSONE (DELTASONE) 5 MG tablet Take 5 mg by mouth daily. Take x 4 days after discontinuing dexamethasone.-- TO START ON 09-12-2012      . prochlorperazine (COMPAZINE) 10 MG tablet Take 1 tablet (10 mg total) by mouth every 6 (six) hours as needed.  30 tablet  3  . tobramycin-dexamethasone (TOBRADEX) ophthalmic solution Place 1 drop into both eyes 2 (two) times daily.  5 mL  1  . valACYclovir (VALTREX) 1000 MG tablet Take 1 tablet (1,000 mg total) by mouth 2 (two) times daily.  28 tablet  0   No current facility-administered medications for this encounter.   Facility-Administered Medications Ordered in Other Encounters  Medication Dose Route Frequency Provider Last Rate Last Dose  . heparin lock flush 100 unit/mL  500 Units Intravenous Once Lowella Dell, MD      . sodium chloride 0.9 % injection 10 mL  10 mL Intravenous PRN Lowella Dell, MD        No Known Allergies   PHYSICAL EXAM: Filed Vitals:   01/07/13 1122  BP: 100/58  Pulse: 70  Weight: 126 lb 12.8 oz (57.516 kg)  SpO2: 97%   General:  Well appearing. No respiratory  difficulty HEENT: Normal Neck: supple. no JVD. Carotids 2+ bilat; no bruits. No lymphadenopathy or thryomegaly appreciated. Cor: PMI nondisplaced. Regular rate & rhythm. No rubs, gallops or murmurs. Lungs: clear Abdomen: soft, nontender, nondistended. No hepatosplenomegaly. No bruits or masses. Good bowel sounds. Extremities: no cyanosis, clubbing, rash, edema Neuro: alert & oriented x 3, cranial nerves grossly intact. moves all 4 extremities w/o difficulty. Affect pleasant.   ASSESSMENT & PLAN: 1. Chemotherapy cardiomyopathy screening: Echo today showed EF 60-65% with stable lateral s'.  LV systolic function appears improved.  She is no longer getting Herceptin.  Plan for followup with echo in 6 months.  2. Hypotension: SBP in 100s now.  She is off losartan and Coreg has been cut back to 3.125 mg bid.  Continue current Coreg.   Marca Ancona 01/08/2013

## 2013-01-08 ENCOUNTER — Encounter (HOSPITAL_BASED_OUTPATIENT_CLINIC_OR_DEPARTMENT_OTHER): Payer: Self-pay | Admitting: *Deleted

## 2013-01-08 DIAGNOSIS — I959 Hypotension, unspecified: Secondary | ICD-10-CM | POA: Insufficient documentation

## 2013-01-08 NOTE — Progress Notes (Signed)
To [facility] at 0715 -Istat ,Ekg  on arrival-2D echo,chest CT in epic-Npo after Mn-instructed to take coreg,prednisone with small amt water - any pain or nausea meds as well if needed.

## 2013-01-11 ENCOUNTER — Encounter: Payer: Self-pay | Admitting: Oncology

## 2013-01-11 ENCOUNTER — Other Ambulatory Visit: Payer: Medicare Other | Admitting: Lab

## 2013-01-11 ENCOUNTER — Encounter: Payer: Self-pay | Admitting: Physician Assistant

## 2013-01-11 ENCOUNTER — Telehealth: Payer: Self-pay | Admitting: *Deleted

## 2013-01-11 ENCOUNTER — Other Ambulatory Visit (HOSPITAL_BASED_OUTPATIENT_CLINIC_OR_DEPARTMENT_OTHER): Payer: Medicare Other | Admitting: Lab

## 2013-01-11 ENCOUNTER — Ambulatory Visit (HOSPITAL_BASED_OUTPATIENT_CLINIC_OR_DEPARTMENT_OTHER): Payer: Medicare Other | Admitting: Physician Assistant

## 2013-01-11 ENCOUNTER — Ambulatory Visit (HOSPITAL_BASED_OUTPATIENT_CLINIC_OR_DEPARTMENT_OTHER): Payer: Medicare Other

## 2013-01-11 VITALS — BP 125/61 | HR 77

## 2013-01-11 VITALS — BP 88/54 | HR 78 | Temp 98.2°F | Resp 20 | Ht 64.0 in | Wt 128.2 lb

## 2013-01-11 DIAGNOSIS — C7970 Secondary malignant neoplasm of unspecified adrenal gland: Secondary | ICD-10-CM

## 2013-01-11 DIAGNOSIS — C50912 Malignant neoplasm of unspecified site of left female breast: Secondary | ICD-10-CM

## 2013-01-11 DIAGNOSIS — Z5111 Encounter for antineoplastic chemotherapy: Secondary | ICD-10-CM

## 2013-01-11 DIAGNOSIS — C773 Secondary and unspecified malignant neoplasm of axilla and upper limb lymph nodes: Secondary | ICD-10-CM

## 2013-01-11 DIAGNOSIS — D630 Anemia in neoplastic disease: Secondary | ICD-10-CM

## 2013-01-11 DIAGNOSIS — C50219 Malignant neoplasm of upper-inner quadrant of unspecified female breast: Secondary | ICD-10-CM

## 2013-01-11 DIAGNOSIS — C801 Malignant (primary) neoplasm, unspecified: Secondary | ICD-10-CM

## 2013-01-11 DIAGNOSIS — M21371 Foot drop, right foot: Secondary | ICD-10-CM

## 2013-01-11 DIAGNOSIS — E860 Dehydration: Secondary | ICD-10-CM

## 2013-01-11 DIAGNOSIS — I959 Hypotension, unspecified: Secondary | ICD-10-CM

## 2013-01-11 LAB — COMPREHENSIVE METABOLIC PANEL (CC13)
AST: 21 U/L (ref 5–34)
BUN: 16.2 mg/dL (ref 7.0–26.0)
Calcium: 9.1 mg/dL (ref 8.4–10.4)
Chloride: 106 mEq/L (ref 98–109)
Creatinine: 1.2 mg/dL — ABNORMAL HIGH (ref 0.6–1.1)
Glucose: 108 mg/dl (ref 70–140)

## 2013-01-11 LAB — CBC WITH DIFFERENTIAL/PLATELET
BASO%: 0.9 % (ref 0.0–2.0)
Basophils Absolute: 0 10*3/uL (ref 0.0–0.1)
HCT: 27.6 % — ABNORMAL LOW (ref 34.8–46.6)
LYMPH%: 28.3 % (ref 14.0–49.7)
MCH: 29.2 pg (ref 25.1–34.0)
MCHC: 32.2 g/dL (ref 31.5–36.0)
MONO#: 0.5 10*3/uL (ref 0.1–0.9)
NEUT%: 55.9 % (ref 38.4–76.8)
Platelets: 255 10*3/uL (ref 145–400)

## 2013-01-11 MED ORDER — DEXAMETHASONE SODIUM PHOSPHATE 10 MG/ML IJ SOLN
10.0000 mg | Freq: Once | INTRAMUSCULAR | Status: AC
  Administered 2013-01-11: 10 mg via INTRAVENOUS

## 2013-01-11 MED ORDER — ONDANSETRON 8 MG/50ML IVPB (CHCC)
8.0000 mg | Freq: Once | INTRAVENOUS | Status: AC
  Administered 2013-01-11: 8 mg via INTRAVENOUS

## 2013-01-11 MED ORDER — SODIUM CHLORIDE 0.9 % IV SOLN
Freq: Once | INTRAVENOUS | Status: AC
  Administered 2013-01-11: 15:00:00 via INTRAVENOUS

## 2013-01-11 MED ORDER — SODIUM CHLORIDE 0.9 % IV SOLN
1000.0000 mg/m2 | Freq: Once | INTRAVENOUS | Status: AC
  Administered 2013-01-11: 1634 mg via INTRAVENOUS
  Filled 2013-01-11: qty 43

## 2013-01-11 MED ORDER — SODIUM CHLORIDE 0.9 % IV SOLN
130.6000 mg | Freq: Once | INTRAVENOUS | Status: AC
  Administered 2013-01-11: 130 mg via INTRAVENOUS
  Filled 2013-01-11: qty 13

## 2013-01-11 MED ORDER — HEPARIN SOD (PORK) LOCK FLUSH 100 UNIT/ML IV SOLN
500.0000 [IU] | Freq: Once | INTRAVENOUS | Status: AC | PRN
  Administered 2013-01-11: 500 [IU]
  Filled 2013-01-11: qty 5

## 2013-01-11 MED ORDER — SODIUM CHLORIDE 0.9 % IJ SOLN
10.0000 mL | INTRAMUSCULAR | Status: DC | PRN
  Administered 2013-01-11: 10 mL
  Filled 2013-01-11: qty 10

## 2013-01-11 NOTE — Patient Instructions (Addendum)
[facility] Discharge Instructions for Patients Receiving Chemotherapy  Today you received the following chemotherapy agents :  Gemzar, Carboplatin.  To help prevent nausea and vomiting after your treatment, we encourage you to take your nausea medication as instructed by your physician.   If you develop nausea and vomiting that is not controlled by your nausea medication, call the clinic.   BELOW ARE SYMPTOMS THAT SHOULD BE REPORTED IMMEDIATELY:  *FEVER GREATER THAN 100.5 F  *CHILLS WITH OR WITHOUT FEVER  NAUSEA AND VOMITING THAT IS NOT CONTROLLED WITH YOUR NAUSEA MEDICATION  *UNUSUAL SHORTNESS OF BREATH  *UNUSUAL BRUISING OR BLEEDING  TENDERNESS IN MOUTH AND THROAT WITH OR WITHOUT PRESENCE OF ULCERS  *URINARY PROBLEMS  *BOWEL PROBLEMS  UNUSUAL RASH Items with * indicate a potential emergency and should be followed up as soon as possible.  Feel free to call the clinic you have any questions or concerns. The clinic phone number is [phone].    

## 2013-01-11 NOTE — Progress Notes (Signed)
Pt saw Zollie Scale, PA today.  Proceed with chemo as planned per PA.

## 2013-01-11 NOTE — Progress Notes (Signed)
ID: Rebecca Pugh   DOB: Nov 09, 1950  MR#: [id number]  CSN#:[id number]  PCP: Rebecca Bur, MD GYN: SU: Rebecca Pugh OTHER MD: Rebecca Pugh  HISTORY OF PRESENT ILLNESS: The patient and her husband, Rebecca Pugh, moved back to this area recently from [facility] (Rebecca Pugh actually is a native of [place] and incidentally remains a [employer] fan).  As part of moving boxes, Rebecca Pugh felt something under her left arm.  She brought it to Dr. Lovena Pugh attention and although the patient states that she has always had some cyclical swelling of her left axilla during menstruation, Dr. Lalla Pugh set the patient up for evaluation at [facility].  On 12-12-05 the patient had a mammogram and ultrasound there and these showed no mass, distortion or microcalcifications in either breast however, in the left axilla there was a large macrolobulated mass and just above that there was a slightly prominent lymph node which did demonstrate a fatty hilum.  Physically on exam the mass was mobile and nontender and measured approximately 6.5 cm.  The ultrasound showed two small hypoechoic nodules in the left breast 3 cm. from the nipple measuring 7 mm. each and felt to be most likely benign however, the axillary mass, of course, was quite suspicious and biopsy was performed the same day.  This showed ([id number] and [id number]) invasive breast cancer which was strongly ER positive at 76%, moderately PR positive at 25% with a very high proliferation marker at 66%.  HER-2/neu was 1+ and FISH showed no amplification with a ratio of 1.1.    With this information, the patient was referred to Dr. Claud Pugh and on 12-20-05 the patient had bilateral breast MRI's.  In the left breast there was a 5.8 cm. axillary mass and several abnormally enlarged lymph nodes.  There was a 7 mm. left supraclavicular lymph node and the two nodules in the breast that had been noted by ultrasound were also noted by MRI measuring 1.1 and 0.7 cm.  In the right breast there  were no abnormalities noted.  MR guided biopsies of the left breast masses were obtained on 12-27-05 and both showed high grade ductal carcinoma.  There was evidence of lymphovascular invasion and high grade ductal carcinoma in situ as well ([id number]).    Dr. Derrell Pugh discussed the situation with me and we felt it would be useful to have a PET scan prior to definitive surgery.  This was obtained on 12-30-05 and showed the primary axillary mass to have an SUV of 8.5.  The lymph nodes in the left axilla had SUV's in the 2.2 range, small focus of increased activity in the left breast had an SUV of 2.3.  There was also skin thickening with mildly increased diffuse FDG uptake within the skin suggesting an inflammatory breast cancer.  The neck, abdomen and  pelvis were negative.  In the lung windows, there were several scattered small areas of focal nodular density in the upper lobes with compressive atelectasis and this was felt to be benign.  Chest x-ray on 01-09-06 was negative.  With this information, Dr. Derrell Pugh proceeded, after appropriate discussion, to left modified radical mastectomy on 01-15-06 and the insertion of a BARD port at the same time.  The final pathology ([id number]) showed the maximum tumor size to be 5 cm.  Margins were ample.  There was evidence of lymphovascular invasion.  This infiltrating ductal carcinoma was a grade 3 of 3 and 11 out of 14 lymph nodes were involved.  There was evidence of  extracapsular extension.  Her subsequent history is as detailed below.   INTERVAL HISTORY: Rebecca Pugh returns today with her husband Rebecca Pugh for followup of her metastatic breast carcinoma. She's currently receiving carboplatin/gemcitabine, both agents given on days one and 8 of each 21 day cycle, and is due for day 1 cycle 2 today. She's tolerating the chemotherapy extremely well, and in fact tells me she is having "no side effects at all."   Rebecca Pugh has noticed a significant improvement in the adenopathy in the  right and left supraclavicular regions. Since that has decreased, the pain in her left shoulder and arm has also improved significantly.  She continues to have some problems with right footdrop, and is in the process of obtaining a brace for the right foot as recommended by Rebecca Pugh.  REVIEW OF SYSTEMS: Rebecca Pugh has had no fevers or chills. Her energy level is much improved, and she has actually been able to do some small chores around the house. She denies any skin changes or rashes. She's had no abnormal bruising or bleeding. Her appetite has improved and her weight is stable. She denies any problems with nausea and has had no change in bowel or bladder habits. She denies any abnormal headaches  or change in vision. She has had some problems with her blood pressure dropping too low which has caused some subsequent dizziness, and "fainting" on 2 occasions. She's had no increased cough or phlegm production. She does have shortness of breath with exertion but this has not worsened. She denies any orthopnea. She's had no chest pain or palpitations. She denies any peripheral swelling. Currently, she also denies any back pain and has had no additional myalgias, arthralgias, or bony pain. She still has peripheral neuropathy in both the upper and lower extremities, stable.   A detailed review of systems is otherwise stable.     PAST MEDICAL HISTORY: Past Medical History  Diagnosis Date  . Hypertension   . Depression   . Hydronephrosis, bilateral   . Breast cancer metastasized to multiple sites POSITIVE METS IN 2012-----  ONCOLOGIST--  DR Darnelle Pugh--  CURRENTLY RECEIVING CHEMO  EVERY 3 WEEKS (STARTED 06-15-2012)    FIRST DX 01-15-2006, INVASIVE DUCTAL GRADE III/    S/P LEFT MASTECTOMY AND CHEMORADIATION (CHEMO COMPLETE NOV 2007 AND XRT COMPLETE FEB 2008)  remote tonsillectomy, remote cholecystectomy, status post appendectomy, multiple skin biopsies for what the patient said were simple keratoses, and  status post port placement.  FAMILY HISTORY: The patient has no information regarding her father. She was brought up by her stepfather. The patient's mother died at the age of 48 with bladder cancer. The patient has one sister who is in good health. There is no history of breast or ovarian cancer in the family to the patient's knowledge.   GYNECOLOGIC HISTORY: She is GX, P2. She was premenopausal at the time of her initial breast cancer diagnosis  SOCIAL HISTORY: She and her husband, Rebecca Pugh, have been married 10 years. 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 She has two children, a daughter, [name], who lives in [place] and is a Investment banker, corporate, and a son, [name], also in [place], who works for [employer]. She has two grandchildren and three step-grandchildren from her own two children, [name] and [name], who are from her first marriage. Rebecca Pugh has one daughter from his first marriage and he has grandchildren through her. The patient was brought  up as a Catholic but currently is not practicing. She and Rebecca Pugh are attending a [employer].      ADVANCED DIRECTIVES:  HEALTH MAINTENANCE: History  Substance Use Topics  . Smoking status: Former Smoker -- 20 years    Quit date: 01/31/1995  . Smokeless tobacco: Never Used  . Alcohol Use: Yes     Comment: occasionl     Colonoscopy:  PAP:  Bone density: August 2012, "Normal"  Lipid panel:  No Known Allergies  Current Outpatient Prescriptions  Medication Sig Dispense Refill  . calcium citrate (CALCITRATE - DOSED IN MG ELEMENTAL CALCIUM) 950 MG tablet Take 1 tablet by mouth daily.       . carvedilol (COREG) 3.125 MG tablet Take 1 tablet (3.125 mg total) by mouth 2 (two) times daily with a meal.  60 tablet  3  . cholecalciferol (VITAMIN D) 1000 UNITS tablet Take 3,000 Units by mouth daily.       . cholestyramine (QUESTRAN) 4 G packet Take 1 packet by  mouth 2 (two) times daily with a meal. Mix with water.      . Desloratadine-Pseudoephedrine (CLARINEX-D 24 HOUR PO) Take 1 capsule by mouth daily.      Marland Kitchen FLUoxetine (PROZAC) 40 MG capsule TAKE ONE CAPSULE BY MOUTH ONCE DAILY  30 capsule  6  . gabapentin (NEURONTIN) 300 MG capsule Take 1 capsule (300 mg total) by mouth at bedtime.  30 capsule  5  . letrozole (FEMARA) 2.5 MG tablet       . lidocaine (XYLOCAINE) 5 % ointment Apply topically 2 (two) times daily as needed.  30 g  1  . lidocaine-prilocaine (EMLA) cream Apply topically as needed.  30 g  3  . loperamide (IMODIUM) 2 MG capsule Take 2 mg by mouth 4 (four) times daily as needed. diarrhea      . LORazepam (ATIVAN) 0.5 MG tablet Take 1 tablet (0.5 mg total) by mouth 2 (two) times daily as needed.  60 tablet  0  . losartan (COZAAR) 50 MG tablet Take 0.5 tablets (25 mg total) by mouth daily.  30 tablet  6  . metoCLOPramide (REGLAN) 10 MG tablet Take 0.5 tablets (5 mg total) by mouth 4 (four) times daily -  before meals and at bedtime.  120 tablet  12  . omeprazole (PRILOSEC) 40 MG capsule Take 1 capsule (40 mg total) by mouth at bedtime.  30 capsule  12  . ondansetron (ZOFRAN) 8 MG tablet Take 8 mg by mouth 2 (two) times daily as needed for nausea.      Marland Kitchen oxybutynin (DITROPAN) 5 MG tablet       . oxyCODONE (OXY IR/ROXICODONE) 5 MG immediate release tablet Take 1 tablet (5 mg total) by mouth every 6 (six) hours as needed.  30 tablet  0  . potassium chloride (MICRO-K) 10 MEQ CR capsule Take 2 capsules (20 mEq total) by mouth daily.  60 capsule  1  . predniSONE (DELTASONE) 5 MG tablet Take 5 mg by mouth daily. Take x 4 days after discontinuing dexamethasone.-- TO START ON 09-12-2012      . prochlorperazine (COMPAZINE) 10 MG tablet Take 1 tablet (10 mg total) by mouth every 6 (six) hours as needed.  30 tablet  3  . tobramycin-dexamethasone (TOBRADEX) ophthalmic solution Place 1 drop into both eyes 2 (two) times daily.  5 mL  1  . valACYclovir  (VALTREX) 1000 MG tablet Take 1 tablet (1,000 mg total) by mouth 2 (two) times  daily.  28 tablet  0   No current facility-administered medications for this visit.   Facility-Administered Medications Ordered in Other Visits  Medication Dose Route Frequency Provider Last Rate Last Dose  . heparin lock flush 100 unit/mL  500 Units Intravenous Once Lowella Dell, MD      . sodium chloride 0.9 % injection 10 mL  10 mL Intravenous PRN Lowella Dell, MD         OBJECTIVE:  Filed Vitals:   01/11/13 1337  BP: 88/54  Pulse: 78  Temp: 98.2 F (36.8 C)  Resp: 20  Body mass index is 21.99 kg/(m^2). Filed Weights   01/11/13 1337  Weight: 128 lb 3.2 oz (58.151 kg)  Middle-aged white female who appears positive and in no acute distress. ECOG: 2  Sclerae unicteric Oropharynx clear,  no ulcerations. Buccal mucosa is slightly dry. There is still fullness palpated in the cervical and supraclavicular regions bilaterally, and I'm able to palpate a slightly enlarged lymph node on the left side. There does seem to be significant improvement, however, since her last visit.  Lungs clear to auscultation, no rales or rhonchi Heart regular rate and rhythm Abdomen nontender, soft, positive bowel sounds MSK  no peripheral edema, right foot drop which is affecting gait Neuro: nonfocal, well oriented, appropriate affect Breasts: Deferred. No axillary adenopathy palpated. Skin turgor is fair.     LAB RESULTS:  Lab Results  Component Value Date   WBC 3.5* 01/11/2013   NEUTROABS 2.0 01/11/2013   HGB 8.9* 01/11/2013   HCT 27.6* 01/11/2013   MCV 90.5 01/11/2013   PLT 255 01/11/2013      Chemistry      Component Value Date/Time   NA 135* 01/04/2013 0944   NA 140 10/20/2012 1113   NA 142 11/05/2010 1036   K 3.9 01/04/2013 0944   K 4.6 10/20/2012 1113   K 4.5 11/05/2010 1036   CL 99 11/30/2012 0835   CL 102 02/01/2012 0500   CL 99 11/05/2010 1036   CO2 25 01/04/2013 0944   CO2 25 02/01/2012 0500   CO2 26  11/05/2010 1036   BUN 21.3 01/04/2013 0944   BUN 13 02/01/2012 0500   BUN 14 11/05/2010 1036   CREATININE 1.4* 01/04/2013 0944   CREATININE 0.97 02/01/2012 0500   CREATININE 0.8 11/05/2010 1036      Component Value Date/Time   CALCIUM 9.9 01/04/2013 0944   CALCIUM 8.6 02/01/2012 0500   CALCIUM 8.8 11/05/2010 1036   ALKPHOS 103 01/04/2013 0944   ALKPHOS 88 01/08/2012 0916   ALKPHOS 106* 11/05/2010 1036   AST 26 01/04/2013 0944   AST 24 01/08/2012 0916   AST 27 11/05/2010 1036   ALT 34 01/04/2013 0944   ALT 27 01/08/2012 0916   ALT 35 11/05/2010 1036   BILITOT 0.75 01/04/2013 0944   BILITOT 0.9 01/08/2012 0916   BILITOT 0.80 11/05/2010 1036       Lab Results  Component Value Date   LABCA2 107* 08/17/2012    STUDIES:  Mr Rebecca Pugh WU Contrast  12/05/2012   *RADIOLOGY REPORT*  Clinical Data: Breast cancer.  Newly discovered liver metastases.  MRI HEAD WITHOUT AND WITH CONTRAST  Technique:  Multiplanar, multiecho pulse sequences of the brain and surrounding structures were obtained according to standard protocol without and with intravenous contrast  Contrast: 6mL MULTIHANCE GADOBENATE DIMEGLUMINE 529 MG/ML IV SOLN  Comparison: CT head cervical 11/09/2011. MRI brain 12/04/2010.  Findings: No acute stroke or hemorrhage.  No mass lesion or hydrocephalus.  No extra-axial fluid.  Normal cerebral volume. Minor white matter changes are suspected in the periventricular greater than subcortical regions, also in the mid pons, suggesting chronic microvascular ischemic change and/or post treatment effect. There is no midline shift.  Flow voids are maintained in the major intracranial vessels.  Post infusion, there is no abnormal enhancement of the brain or meninges. There are no definite osseous metastases.  No skull base or upper cervical lesions.  Normal midline structures. The orbits, sinuses, and mastoids appear unremarkable. Pathologic cervical adenopathy is incompletely evaluated on this brain exam.  Prominence of  the left jugular bulb may reflect nodal disease in the neck.  IMPRESSION: Unremarkable cranial MRI. Minor changes of small vessel disease. No evidence intracranial metastatic disease.   Original Report Authenticated By: Davonna Belling, M.D.   Mr Shoulder Left W Contrast  12/26/2012   *RADIOLOGY REPORT*  Clinical Data: Left shoulder pain.  History of breast cancer.  MRI LEFT SHOULDER WITH CONTRAST  Technique:  Multiplanar, multisequence MR imaging of the left shoulder was performed following the administration of intravenous contrast.  Contrast: 6mL MULTIHANCE GADOBENATE DIMEGLUMINE 529 MG/ML IV SOLN  Comparison:  CT scan chest, abdomen and pelvis 11/17/2012.  Findings:  There is no evidence of metastatic disease.  Mildly increased T2 signal in the supraspinatus and infraspinatus tendons consistent with tendinopathy is identified.  The tendons are intact.  The subscapularis appears normal.  The long head of biceps tendon is intact.  The labrum is unremarkable.  The patient has moderate acromioclavicular degenerative change.  The acromion is type 2.  A small amount of fluid is present in the subacromial/subdeltoid bursa.  IMPRESSION:  1.  Mild to moderate appearing supraspinatus and infraspinatus tendinopathy without tear. 2.  Acromioclavicular osteoarthritis. 3.  Small amount fluid in the subacromial/subdeltoid bursa compatible with bursitis. 4.  Negative for metastatic disease.   Original Report Authenticated By: Holley Dexter, M.D.    ASSESSMENT: 62 y.o.  [facility] woman with stage IV breast cancer.  1. 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 2. Status post 4 cycles of adjuvant docetaxel, carboplatin and trastuzumab completed November 2007. 3. Postmastectomy radiation completed February 2008. 4. Anastrozole begun February 2008 (the trastuzumab also was continued to complete a year), continued  until June 2012.  5. 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 with left retrocrural and retroperitoneal adenopathy.  6. On tamoxifen/lapatinib/trastuzumab between June 2012 and October 2012.  7. Ixempra/lapatinib/trastuzumab started October 2012, the Ixempra discontinued in February 2013 due to peripheral neuropathy. 8. Continuing on lapatinib/trastuzumab with the addition of fulvestrant, first injections given on 07/31/2011. Lapatinib given at a dose of 1 g (4 tablets) daily, and trastuzumab given at 8 mg/kg every 4 weeks to coordinate with injection appointments. 9. Discontinued lapatinib and fulvestrant in December 2013 10. Started q. three-week docetaxel/ trastuzumab/ pertuzumab, first dose given 06/15/2012.  Docetaxel given at a 50% dose reduction beginning with cycle 2 due to poor tolerance and severe neutropenia. Completed 6 cycles as of 09/28/2012.  11. Trastuzumab/ pertuzumab continued  to June of 2014, with evidence of disease progression. Letrozole added 10/19/2012. 12. Bilateral hydronephrosis noted March 2014, bilateral ureteral stenting performed 09/11/2012 under Dr Brunilda Payor 13. Herpes Zoster June 2014, resolved 14. TDM-1 started 11/30/2012, with 1  dose given.  An echocardiogram on 12/07/2012 showed a reduced ejection fraction of 40%, likely secondary to previous treatment with trastuzumab. Subsequently, TDM-1 was discontinued. 15. Starting on treatment with carboplatin/gemcitabine, both agents to be given on days one and 8 of each 21 day cycle, first dose on 12/21/2012.  Carboplatin will be given at an AUC of 2, the gemcitabine at 1000 mg per meter square. The plan is to complete 3 cycles, then restage.    PLAN:  Adri appears to be tolerating the carboplatin/gemcitabine remarkably well. She'll proceed for treatment today as scheduled for day 1  cycle 2, and will receive some extra IV fluids for supportive measures today. Hopefully this will help with her mild dehydration and hypotension. Rebecca Pugh has also suggested that [facility] drop her morning dose of carvedilol, and she'll be taking only 3.125 mg once daily at night.  I've also asked our chemotherapy nurse to rechecked when this blood pressure before discharging her today.  Brylyn is scheduled to have her stents replaced this Friday, August 1. She'll return next Monday for day 8 cycle 2 of carboplatin/gemcitabine, and will likely need a blood transfusion at that time as well. She is planning to leave on Thursday, August 7 for a trip to [facility] to visit family, and will return on August 12.   I will personally see Alisea again in 3 weeks on August 18 in anticipation of day 1 cycle 3 of chemotherapy. Tentatively, we are planning on restaging in late August when she has completed 3 cycles (6 doses) of carboplatin/gemcitabine, and she will see Rebecca Pugh to review those results in early September.   Chasty and Rebecca Pugh both voice understanding and agreement with this plan thus far. As always, they'll call with any changes or problems.     Mickey Esguerra    01/11/2013

## 2013-01-11 NOTE — Telephone Encounter (Signed)
appts made and printed. Pt is aware that her blood trans will be added. i emailed MW to add tx...td

## 2013-01-11 NOTE — Telephone Encounter (Signed)
Per staff message and POF I have scheduled appts.  JMW  

## 2013-01-12 ENCOUNTER — Ambulatory Visit: Payer: Medicare Other

## 2013-01-15 ENCOUNTER — Ambulatory Visit (HOSPITAL_BASED_OUTPATIENT_CLINIC_OR_DEPARTMENT_OTHER): Payer: Medicare Other | Admitting: Anesthesiology

## 2013-01-15 ENCOUNTER — Encounter (HOSPITAL_BASED_OUTPATIENT_CLINIC_OR_DEPARTMENT_OTHER)
Admission: RE | Disposition: A | Discharge status: Home / Self Care | Payer: Self-pay | Source: Ambulatory Visit | Attending: Urology

## 2013-01-15 ENCOUNTER — Other Ambulatory Visit: Payer: Self-pay | Admitting: Physician Assistant

## 2013-01-15 ENCOUNTER — Encounter (HOSPITAL_BASED_OUTPATIENT_CLINIC_OR_DEPARTMENT_OTHER): Payer: Self-pay | Admitting: *Deleted

## 2013-01-15 ENCOUNTER — Encounter (HOSPITAL_BASED_OUTPATIENT_CLINIC_OR_DEPARTMENT_OTHER): Payer: Self-pay | Admitting: Anesthesiology

## 2013-01-15 ENCOUNTER — Ambulatory Visit (HOSPITAL_BASED_OUTPATIENT_CLINIC_OR_DEPARTMENT_OTHER)
Admission: RE | Admit: 2013-01-15 | Discharge: 2013-01-15 | Disposition: A | Discharge status: Home / Self Care | Payer: Medicare Other | Source: Ambulatory Visit | Attending: Urology | Admitting: Urology

## 2013-01-15 ENCOUNTER — Ambulatory Visit (HOSPITAL_COMMUNITY): Payer: Medicare Other

## 2013-01-15 DIAGNOSIS — F3289 Other specified depressive episodes: Secondary | ICD-10-CM | POA: Insufficient documentation

## 2013-01-15 DIAGNOSIS — C50919 Malignant neoplasm of unspecified site of unspecified female breast: Secondary | ICD-10-CM | POA: Insufficient documentation

## 2013-01-15 DIAGNOSIS — Z9221 Personal history of antineoplastic chemotherapy: Secondary | ICD-10-CM | POA: Insufficient documentation

## 2013-01-15 DIAGNOSIS — C779 Secondary and unspecified malignant neoplasm of lymph node, unspecified: Secondary | ICD-10-CM | POA: Insufficient documentation

## 2013-01-15 DIAGNOSIS — Z923 Personal history of irradiation: Secondary | ICD-10-CM | POA: Insufficient documentation

## 2013-01-15 DIAGNOSIS — F329 Major depressive disorder, single episode, unspecified: Secondary | ICD-10-CM | POA: Insufficient documentation

## 2013-01-15 DIAGNOSIS — Z9010 Acquired absence of unspecified breast and nipple: Secondary | ICD-10-CM | POA: Insufficient documentation

## 2013-01-15 DIAGNOSIS — Z87891 Personal history of nicotine dependence: Secondary | ICD-10-CM | POA: Insufficient documentation

## 2013-01-15 DIAGNOSIS — Z79899 Other long term (current) drug therapy: Secondary | ICD-10-CM | POA: Insufficient documentation

## 2013-01-15 DIAGNOSIS — I10 Essential (primary) hypertension: Secondary | ICD-10-CM | POA: Insufficient documentation

## 2013-01-15 DIAGNOSIS — F411 Generalized anxiety disorder: Secondary | ICD-10-CM | POA: Insufficient documentation

## 2013-01-15 DIAGNOSIS — N1330 Unspecified hydronephrosis: Secondary | ICD-10-CM | POA: Insufficient documentation

## 2013-01-15 HISTORY — PX: CYSTOSCOPY W/ URETERAL STENT PLACEMENT: SHX1429

## 2013-01-15 LAB — POCT I-STAT, CHEM 8
BUN: 18 mg/dL (ref 6–23)
Chloride: 102 mEq/L (ref 96–112)
Sodium: 140 mEq/L (ref 135–145)

## 2013-01-15 SURGERY — CYSTOSCOPY, FLEXIBLE, WITH STENT REPLACEMENT
Anesthesia: General | Site: Ureter | Laterality: Bilateral | Wound class: Clean Contaminated

## 2013-01-15 MED ORDER — LACTATED RINGERS IV SOLN
INTRAVENOUS | Status: DC
  Filled 2013-01-15: qty 1000

## 2013-01-15 MED ORDER — ONDANSETRON HCL 4 MG/2ML IJ SOLN
INTRAMUSCULAR | Status: DC | PRN
  Administered 2013-01-15: 4 mg via INTRAVENOUS

## 2013-01-15 MED ORDER — PROMETHAZINE HCL 25 MG/ML IJ SOLN
6.2500 mg | INTRAMUSCULAR | Status: DC | PRN
  Filled 2013-01-15: qty 1

## 2013-01-15 MED ORDER — STERILE WATER FOR IRRIGATION IR SOLN
Status: DC | PRN
  Administered 2013-01-15: 3000 mL

## 2013-01-15 MED ORDER — FENTANYL CITRATE 0.05 MG/ML IJ SOLN
25.0000 ug | INTRAMUSCULAR | Status: DC | PRN
  Filled 2013-01-15: qty 1

## 2013-01-15 MED ORDER — DEXAMETHASONE SODIUM PHOSPHATE 4 MG/ML IJ SOLN
INTRAMUSCULAR | Status: DC | PRN
  Administered 2013-01-15: 10 mg via INTRAVENOUS

## 2013-01-15 MED ORDER — GLYCOPYRROLATE 0.2 MG/ML IJ SOLN
INTRAMUSCULAR | Status: DC | PRN
  Administered 2013-01-15: 0.2 mg via INTRAVENOUS

## 2013-01-15 MED ORDER — LACTATED RINGERS IV SOLN
INTRAVENOUS | Status: DC
  Administered 2013-01-15 (×2): via INTRAVENOUS
  Filled 2013-01-15: qty 1000

## 2013-01-15 MED ORDER — CEFAZOLIN SODIUM-DEXTROSE 2-3 GM-% IV SOLR
2.0000 g | INTRAVENOUS | Status: AC
  Administered 2013-01-15: 2 g via INTRAVENOUS
  Filled 2013-01-15: qty 50

## 2013-01-15 MED ORDER — LIDOCAINE HCL (CARDIAC) 20 MG/ML IV SOLN
INTRAVENOUS | Status: DC | PRN
  Administered 2013-01-15: 60 mg via INTRAVENOUS

## 2013-01-15 MED ORDER — PROPOFOL 10 MG/ML IV BOLUS
INTRAVENOUS | Status: DC | PRN
  Administered 2013-01-15: 120 mg via INTRAVENOUS

## 2013-01-15 MED ORDER — FENTANYL CITRATE 0.05 MG/ML IJ SOLN
INTRAMUSCULAR | Status: DC | PRN
  Administered 2013-01-15: 50 ug via INTRAVENOUS
  Administered 2013-01-15: 25 ug via INTRAVENOUS

## 2013-01-15 MED ORDER — CEFAZOLIN SODIUM 1-5 GM-% IV SOLN
1.0000 g | INTRAVENOUS | Status: DC
  Filled 2013-01-15: qty 50

## 2013-01-15 MED ORDER — MIDAZOLAM HCL 5 MG/5ML IJ SOLN
INTRAMUSCULAR | Status: DC | PRN
  Administered 2013-01-15 (×2): 1 mg via INTRAVENOUS

## 2013-01-15 SURGICAL SUPPLY — 20 items
BAG DRAIN URO-CYSTO SKYTR STRL (DRAIN) ×2 IMPLANT
BAG DRN UROCATH (DRAIN) ×1
CANISTER SUCT LVC 12 LTR MEDI- (MISCELLANEOUS) ×1 IMPLANT
CATH INTERMIT  6FR 70CM (CATHETERS) IMPLANT
CATH URET 5FR 28IN CONE TIP (BALLOONS)
CATH URET 5FR 70CM CONE TIP (BALLOONS) IMPLANT
CLOTH BEACON ORANGE TIMEOUT ST (SAFETY) ×2 IMPLANT
DRAPE CAMERA CLOSED 9X96 (DRAPES) ×1 IMPLANT
GLOVE BIO SURGEON STRL SZ7 (GLOVE) ×2 IMPLANT
GLOVE ECLIPSE 6.5 STRL STRAW (GLOVE) ×1 IMPLANT
GLOVE ECLIPSE STER SZ 5.5 (GLOVE) ×1 IMPLANT
GLOVE INDICATOR 6.5 STRL GRN (GLOVE) ×4 IMPLANT
GOWN STRL NON-REIN LRG LVL3 (GOWN DISPOSABLE) ×2 IMPLANT
GUIDEWIRE 0.038 PTFE COATED (WIRE) ×1 IMPLANT
GUIDEWIRE ANG ZIPWIRE 038X150 (WIRE) IMPLANT
GUIDEWIRE STR DUAL SENSOR (WIRE) ×1 IMPLANT
NS IRRIG 500ML POUR BTL (IV SOLUTION) IMPLANT
PACK CYSTOSCOPY (CUSTOM PROCEDURE TRAY) ×2 IMPLANT
STENT POLARIS LOOP 8FR X 26 CM (STENTS) ×2 IMPLANT
WATER STERILE IRR 3000ML UROMA (IV SOLUTION) ×1 IMPLANT

## 2013-01-15 NOTE — H&P (Signed)
History and Physical  Chief Complaint: Bilateral hydronephrosis  History of Present Illness: Patient is a 62 years old female who has  bilateral double-J stents for bilateral hydronephrosis secondary to metastatic breast cancer. She is scheduled today for double-J stents exchange  Past Medical History  Diagnosis Date  . Hypertension   . Depression   . Hydronephrosis, bilateral   . Breast cancer metastasized to multiple sites POSITIVE METS IN 2012-----  ONCOLOGIST--  DR Darnelle Catalan--  CURRENTLY RECEIVING CHEMO  EVERY 3 WEEKS (STARTED 06-15-2012)    FIRST DX 01-15-2006, INVASIVE DUCTAL GRADE III/    S/P LEFT MASTECTOMY AND CHEMORADIATION (CHEMO COMPLETE NOV 2007 AND XRT COMPLETE FEB 2008)   Past Surgical History  Procedure Laterality Date  . Orif ankle fracture  01/31/2012    Procedure: OPEN REDUCTION INTERNAL FIXATION (ORIF) ANKLE FRACTURE;  Surgeon: Loanne Drilling, MD;  Location: [facility];  Service: Orthopedics;  Laterality: Right;  . Port-a-cath placement  12-05-2010  . Mastectomy modified radical Left 01-15-2006    W/ LYMPH NODE DISSECTIONS AND PORT-A-CATH PLACEMENT (REMOVAL 04-06-2007)  . Transthoracic echocardiogram  09-08-2012    MILD LVF/  NORMAL LVSF/ EF 60-65%  . Cholecystectomy  1977    AND APPENDECTOMY  . Tonsillectomy  AS CHILD  . Cystoscopy w/ ureteral stent placement Bilateral 09/11/2012    Procedure: CYSTOSCOPY WITH RETROGRADE PYELOGRAM/URETERAL STENT PLACEMENT;  Surgeon: Lindaann Slough, MD;  Location: [facility];  Service: Urology;  Laterality: Bilateral;  1 HR   . Transthoracic echocardiogram  09-08-2012    mild lvf/ lvsf normal/ ef 60-65%  . Cystoscopy with stent placement N/A 10/20/2012    Procedure: CYSTOSCOPY, bilateral retrogrades WITH bilateral  STENT RE PLACEMENTs;  Surgeon: Lindaann Slough, MD;  Location: [facility];  Service: Urology;  Laterality: N/A;  BILATERAL JJ STENT PLACEMENT     Medications: Coreg, calcium citrate, vitamin  D, cholestyramine, Clarinex-D. Prozac, gabapentin, letrozol, loperamide, omeprazole, ondansetron, oxybutynin, oxycodone, potassium chloride, prochlorperazine Allergies: No Known Allergies  History reviewed. No pertinent family history. Social History:  reports that she quit smoking about 17 years ago. She has never used smokeless tobacco. She reports that  drinks alcohol. She reports that she does not use illicit drugs.  ROS: All systems are reviewed and negative except as noted.   Physical Exam:  Vital signs in last 24 hours: Temp:  [97.4 F (36.3 C)] 97.4 F (36.3 C) (08/01 0803) Pulse Rate:  [66] 66 (08/01 0803) Resp:  [18] 18 (08/01 0803) BP: (129)/(73) 129/73 mmHg (08/01 0803) SpO2:  [96 %] 96 % (08/01 0803) Weight:  [58.514 kg (129 lb)] 58.514 kg (129 lb) (08/01 0803) HEENT: Normal Cardiovascular: Skin warm; not flushed Respiratory: Breaths quiet; no shortness of breath Abdomen: No masses Neurological: Normal sensation to touch Musculoskeletal: Normal motor function arms and legs Lymphatics: No inguinal adenopathy Skin: No rashes Genitourinary: Normal female genitalia. Meatus is normal  Laboratory Data:  Results for orders placed during the hospital encounter of 01/15/13 (from the past 24 hour(s))  POCT I-STAT, CHEM 8     Status: Abnormal   Collection Time    01/15/13  7:48 AM      Result Value Range   Sodium 140  135 - 145 mEq/L   Potassium 3.2 (*) 3.5 - 5.1 mEq/L   Chloride 102  96 - 112 mEq/L   BUN 18  6 - 23 mg/dL   Creatinine, Ser 1.61 (*) 0.50 - 1.10 mg/dL   Glucose, Bld 99  70 -  99 mg/dL   Calcium, Ion 1.61  0.96 - 1.23 mmol/L   TCO2 25  0 - 100 mmol/L   Hemoglobin 12.6  12.0 - 15.0 g/dL   HCT 04.5  40.9 - 81.1 %   No results found for this or any previous visit (from the past 240 hour(s)). Creatinine:  Recent Labs  01/11/13 1255 01/15/13 0748  CREATININE 1.2* 1.20*    Xrays: See report/chart   Impression/Assessment:  Bilateral hydronephrosis  secondary to metastatic breast cancer  Plan:  Double-J stents exchange.  Mona Ayars-HENRY 01/15/2013, 8:57 AM

## 2013-01-15 NOTE — Transfer of Care (Signed)
Immediate Anesthesia Transfer of Care Note  Patient: Rebecca Pugh  Procedure(s) Performed: Procedure(s) (LRB): CYSTOSCOPY WITH STENT REPLACEMENT (Bilateral)  Patient Location: PACU  Anesthesia Type: General  Level of Consciousness: awake, alert  and oriented  Airway & Oxygen Therapy: Patient Spontanous Breathing and Patient connected to face mask oxygen  Post-op Assessment: Report given to PACU RN and Post -op Vital signs reviewed and stable  Post vital signs: Reviewed and stable  Complications: No apparent anesthesia complications

## 2013-01-15 NOTE — Op Note (Signed)
Rebecca Pugh is a 62 y.o.   01/15/2013  General  Preop diagnosis: Bilateral hydronephrosis secondary to metastatic breast cancer.  Postop diagnosis: Same  Procedure done: Cystoscopy bilateral double-J stents exchange  Surgeon: Wendie Simmer. Rydan Gulyas  Anesthesia: General  Indication: Patient is a 62 years old female with bilateral hydronephrosis secondary to metastatic breast cancer. She has bilateral double-J stents that were replaced on 10/20/2012. She is scheduled today for double-J stents exchange  Procedure: Patient was identified by her wrist band and proper timeout was taken.  Under general anesthesia she was prepped and draped and placed in the dorsolithotomy position. A panendoscope was inserted in the bladder. The bladder mucosa is normal there is no stone or tumor in the bladder. The loops of the Polaris stents exit the ureteral orifices.  The loops of the right stent were grasped with a grasping forceps and pulled through the urethra. A sensor wire was passed through the Polaris stent and the stent was removed. A #[id number] Polaris stent was then passed over the sensor wire. When the proximal end of the stent was identified in the renal pelvis the sensor wire was removed. The proximal curl of the double-J stent is in the renal pelvis and the loops of the stent are in the bladder.  The same procedure was done on the left side.  The bladder was then emptied and the cystoscope and wire removed.  Patient tolerated the procedure well and left the OR in satisfactory condition to postanesthesia care unit.  CC: Dr. Ruthann Cancer

## 2013-01-15 NOTE — Anesthesia Procedure Notes (Signed)
Procedure Name: LMA Insertion Performed by: Daymeon Fischman G Pre-anesthesia Checklist: Patient identified, Emergency Drugs available, Suction available and Patient being monitored Patient Re-evaluated:Patient Re-evaluated prior to inductionOxygen Delivery Method: Circle System Utilized Preoxygenation: Pre-oxygenation with 100% oxygen Intubation Type: IV induction Ventilation: Mask ventilation without difficulty LMA: LMA inserted LMA Size: 4.0 Number of attempts: 1 Airway Equipment and Method: bite block Placement Confirmation: positive ETCO2 Tube secured with: Tape Dental Injury: Teeth and Oropharynx as per pre-operative assessment      

## 2013-01-15 NOTE — Anesthesia Postprocedure Evaluation (Signed)
Anesthesia Post Note  Patient: Rebecca Pugh  Procedure(s) Performed: Procedure(s) (LRB): CYSTOSCOPY WITH STENT REPLACEMENT (Bilateral)  Anesthesia type: General  Patient location: PACU  Post pain: Pain level controlled  Post assessment: Post-op Vital signs reviewed  Last Vitals:  Filed Vitals:   01/15/13 1015  BP: 140/70  Pulse: 74  Temp:   Resp: 11    Post vital signs: Reviewed  Level of consciousness: sedated  Complications: No apparent anesthesia complications

## 2013-01-15 NOTE — Anesthesia Preprocedure Evaluation (Signed)
Anesthesia Evaluation  Patient identified by MRN, date of birth, ID band Patient awake    Reviewed: Allergy & Precautions, H&P , NPO status , Patient's Chart, lab work & pertinent test results  Airway Mallampati: II TM Distance: <3 FB Neck ROM: Full    Dental no notable dental hx. (+) Teeth Intact, Dental Advisory Given and Caps   Pulmonary neg pulmonary ROS,  breath sounds clear to auscultation  Pulmonary exam normal       Cardiovascular hypertension, Pt. on medications negative cardio ROS  Rhythm:Regular Rate:Normal     Neuro/Psych Anxiety Depression negative neurological ROS  negative psych ROS   GI/Hepatic negative GI ROS, Neg liver ROS,   Endo/Other  negative endocrine ROS  Renal/GU Renal diseasenegative Renal ROS  negative genitourinary   Musculoskeletal negative musculoskeletal ROS (+)   Abdominal   Peds  Hematology negative hematology ROS (+)   Anesthesia Other Findings   Reproductive/Obstetrics negative OB ROS Breast CA with abdominal mets                           Anesthesia Physical Anesthesia Plan  ASA: III  Anesthesia Plan: General   Post-op Pain Management:    Induction: Intravenous  Airway Management Planned: LMA  Additional Equipment:   Intra-op Plan:   Post-operative Plan: Extubation in OR  Informed Consent: I have reviewed the patients History and Physical, chart, labs and discussed the procedure including the risks, benefits and alternatives for the proposed anesthesia with the patient or authorized representative who has indicated his/her understanding and acceptance.   Dental advisory given  Plan Discussed with: CRNA  Anesthesia Plan Comments:         Anesthesia Quick Evaluation

## 2013-01-18 ENCOUNTER — Encounter (HOSPITAL_COMMUNITY)
Admission: RE | Admit: 2013-01-18 | Discharge: 2013-01-18 | Disposition: A | Discharge status: Home / Self Care | Payer: Medicare Other | Source: Ambulatory Visit | Attending: Oncology | Admitting: Oncology

## 2013-01-18 ENCOUNTER — Other Ambulatory Visit (HOSPITAL_BASED_OUTPATIENT_CLINIC_OR_DEPARTMENT_OTHER): Payer: Medicare Other | Admitting: Lab

## 2013-01-18 ENCOUNTER — Encounter (HOSPITAL_BASED_OUTPATIENT_CLINIC_OR_DEPARTMENT_OTHER): Payer: Self-pay | Admitting: Urology

## 2013-01-18 ENCOUNTER — Other Ambulatory Visit: Payer: Self-pay | Admitting: Physician Assistant

## 2013-01-18 ENCOUNTER — Ambulatory Visit (HOSPITAL_BASED_OUTPATIENT_CLINIC_OR_DEPARTMENT_OTHER): Payer: Medicare Other

## 2013-01-18 VITALS — BP 144/80 | HR 79 | Temp 97.4°F | Resp 18

## 2013-01-18 DIAGNOSIS — Z5111 Encounter for antineoplastic chemotherapy: Secondary | ICD-10-CM

## 2013-01-18 DIAGNOSIS — D630 Anemia in neoplastic disease: Secondary | ICD-10-CM

## 2013-01-18 DIAGNOSIS — C773 Secondary and unspecified malignant neoplasm of axilla and upper limb lymph nodes: Secondary | ICD-10-CM

## 2013-01-18 DIAGNOSIS — C50219 Malignant neoplasm of upper-inner quadrant of unspecified female breast: Secondary | ICD-10-CM

## 2013-01-18 DIAGNOSIS — C50919 Malignant neoplasm of unspecified site of unspecified female breast: Secondary | ICD-10-CM

## 2013-01-18 DIAGNOSIS — C50912 Malignant neoplasm of unspecified site of left female breast: Secondary | ICD-10-CM

## 2013-01-18 DIAGNOSIS — C801 Malignant (primary) neoplasm, unspecified: Secondary | ICD-10-CM

## 2013-01-18 DIAGNOSIS — C7970 Secondary malignant neoplasm of unspecified adrenal gland: Secondary | ICD-10-CM

## 2013-01-18 LAB — HOLD TUBE, BLOOD BANK

## 2013-01-18 LAB — CBC WITH DIFFERENTIAL/PLATELET
Basophils Absolute: 0 10*3/uL (ref 0.0–0.1)
Eosinophils Absolute: 0 10*3/uL (ref 0.0–0.5)
HCT: 26.2 % — ABNORMAL LOW (ref 34.8–46.6)
HGB: 9 g/dL — ABNORMAL LOW (ref 11.6–15.9)
LYMPH%: 29 % (ref 14.0–49.7)
MCV: 88.3 fL (ref 79.5–101.0)
MONO#: 0.5 10*3/uL (ref 0.1–0.9)
NEUT#: 1.9 10*3/uL (ref 1.5–6.5)
Platelets: 239 10*3/uL (ref 145–400)
RBC: 2.96 10*6/uL — ABNORMAL LOW (ref 3.70–5.45)
WBC: 3.5 10*3/uL — ABNORMAL LOW (ref 3.9–10.3)

## 2013-01-18 LAB — COMPREHENSIVE METABOLIC PANEL (CC13)
ALT: 23 U/L (ref 0–55)
CO2: 26 mEq/L (ref 22–29)
Calcium: 9.2 mg/dL (ref 8.4–10.4)
Chloride: 103 mEq/L (ref 98–109)
Creatinine: 1.1 mg/dL (ref 0.6–1.1)
Glucose: 92 mg/dl (ref 70–140)
Sodium: 140 mEq/L (ref 136–145)
Total Protein: 6.6 g/dL (ref 6.4–8.3)

## 2013-01-18 LAB — PREPARE RBC (CROSSMATCH)

## 2013-01-18 MED ORDER — ONDANSETRON 8 MG/50ML IVPB (CHCC)
8.0000 mg | Freq: Once | INTRAVENOUS | Status: AC
  Administered 2013-01-18: 8 mg via INTRAVENOUS

## 2013-01-18 MED ORDER — DIPHENHYDRAMINE HCL 25 MG PO CAPS
25.0000 mg | ORAL_CAPSULE | Freq: Once | ORAL | Status: AC
  Administered 2013-01-18: 25 mg via ORAL

## 2013-01-18 MED ORDER — SODIUM CHLORIDE 0.9 % IV SOLN
1000.0000 mg/m2 | Freq: Once | INTRAVENOUS | Status: AC
  Administered 2013-01-18: 1634 mg via INTRAVENOUS
  Filled 2013-01-18: qty 43

## 2013-01-18 MED ORDER — HEPARIN SOD (PORK) LOCK FLUSH 100 UNIT/ML IV SOLN
500.0000 [IU] | Freq: Once | INTRAVENOUS | Status: AC | PRN
  Administered 2013-01-18: 500 [IU]
  Filled 2013-01-18: qty 5

## 2013-01-18 MED ORDER — DEXAMETHASONE SODIUM PHOSPHATE 10 MG/ML IJ SOLN
10.0000 mg | Freq: Once | INTRAMUSCULAR | Status: AC
  Administered 2013-01-18: 10 mg via INTRAVENOUS

## 2013-01-18 MED ORDER — SODIUM CHLORIDE 0.9 % IV SOLN
130.6000 mg | Freq: Once | INTRAVENOUS | Status: AC
  Administered 2013-01-18: 130 mg via INTRAVENOUS
  Filled 2013-01-18: qty 13

## 2013-01-18 MED ORDER — ACETAMINOPHEN 325 MG PO TABS
650.0000 mg | ORAL_TABLET | Freq: Once | ORAL | Status: AC
  Administered 2013-01-18: 650 mg via ORAL

## 2013-01-18 MED ORDER — SODIUM CHLORIDE 0.9 % IJ SOLN
10.0000 mL | INTRAMUSCULAR | Status: DC | PRN
  Administered 2013-01-18: 10 mL
  Filled 2013-01-18: qty 10

## 2013-01-18 MED ORDER — SODIUM CHLORIDE 0.9 % IV SOLN
Freq: Once | INTRAVENOUS | Status: AC
  Administered 2013-01-18: 12:00:00 via INTRAVENOUS

## 2013-01-18 NOTE — Patient Instructions (Addendum)
[facility] Discharge Instructions for Patients Receiving Chemotherapy  Today you received the following chemotherapy agents  Carboplatin and Gemzar.   To help prevent nausea and vomiting after your treatment, we encourage you to take your nausea medication as prescribed.   If you develop nausea and vomiting that is not controlled by your nausea medication, call the clinic.   BELOW ARE SYMPTOMS THAT SHOULD BE REPORTED IMMEDIATELY:  *FEVER GREATER THAN 100.5 F  *CHILLS WITH OR WITHOUT FEVER  NAUSEA AND VOMITING THAT IS NOT CONTROLLED WITH YOUR NAUSEA MEDICATION  *UNUSUAL SHORTNESS OF BREATH  *UNUSUAL BRUISING OR BLEEDING  TENDERNESS IN MOUTH AND THROAT WITH OR WITHOUT PRESENCE OF ULCERS  *URINARY PROBLEMS  *BOWEL PROBLEMS  UNUSUAL RASH Items with * indicate a potential emergency and should be followed up as soon as possible.  Feel free to call the clinic you have any questions or concerns. The clinic phone number is [phone].    

## 2013-01-19 LAB — TYPE AND SCREEN
Antibody Screen: NEGATIVE
Unit division: 0

## 2013-02-01 ENCOUNTER — Ambulatory Visit (HOSPITAL_BASED_OUTPATIENT_CLINIC_OR_DEPARTMENT_OTHER): Payer: Medicare Other | Admitting: Physician Assistant

## 2013-02-01 ENCOUNTER — Other Ambulatory Visit: Payer: Self-pay | Admitting: Oncology

## 2013-02-01 ENCOUNTER — Ambulatory Visit (HOSPITAL_BASED_OUTPATIENT_CLINIC_OR_DEPARTMENT_OTHER): Payer: Medicare Other

## 2013-02-01 ENCOUNTER — Other Ambulatory Visit (HOSPITAL_BASED_OUTPATIENT_CLINIC_OR_DEPARTMENT_OTHER): Payer: Medicare Other | Admitting: Lab

## 2013-02-01 ENCOUNTER — Encounter: Payer: Self-pay | Admitting: Physician Assistant

## 2013-02-01 VITALS — BP 109/58 | HR 84 | Temp 98.5°F | Resp 20 | Ht 64.0 in | Wt 130.5 lb

## 2013-02-01 DIAGNOSIS — C50219 Malignant neoplasm of upper-inner quadrant of unspecified female breast: Secondary | ICD-10-CM

## 2013-02-01 DIAGNOSIS — G609 Hereditary and idiopathic neuropathy, unspecified: Secondary | ICD-10-CM

## 2013-02-01 DIAGNOSIS — C773 Secondary and unspecified malignant neoplasm of axilla and upper limb lymph nodes: Secondary | ICD-10-CM

## 2013-02-01 DIAGNOSIS — C50912 Malignant neoplasm of unspecified site of left female breast: Secondary | ICD-10-CM

## 2013-02-01 DIAGNOSIS — M21371 Foot drop, right foot: Secondary | ICD-10-CM

## 2013-02-01 DIAGNOSIS — C7970 Secondary malignant neoplasm of unspecified adrenal gland: Secondary | ICD-10-CM

## 2013-02-01 DIAGNOSIS — Z5111 Encounter for antineoplastic chemotherapy: Secondary | ICD-10-CM

## 2013-02-01 DIAGNOSIS — R0602 Shortness of breath: Secondary | ICD-10-CM

## 2013-02-01 DIAGNOSIS — D630 Anemia in neoplastic disease: Secondary | ICD-10-CM

## 2013-02-01 DIAGNOSIS — C801 Malignant (primary) neoplasm, unspecified: Secondary | ICD-10-CM

## 2013-02-01 DIAGNOSIS — N1330 Unspecified hydronephrosis: Secondary | ICD-10-CM

## 2013-02-01 LAB — CBC WITH DIFFERENTIAL/PLATELET
Basophils Absolute: 0.1 10*3/uL (ref 0.0–0.1)
Eosinophils Absolute: 0.1 10*3/uL (ref 0.0–0.5)
HCT: 31 % — ABNORMAL LOW (ref 34.8–46.6)
LYMPH%: 29.3 % (ref 14.0–49.7)
MONO#: 0.4 10*3/uL (ref 0.1–0.9)
NEUT#: 2.1 10*3/uL (ref 1.5–6.5)
NEUT%: 56.1 % (ref 38.4–76.8)
Platelets: 240 10*3/uL (ref 145–400)
WBC: 3.7 10*3/uL — ABNORMAL LOW (ref 3.9–10.3)

## 2013-02-01 LAB — COMPREHENSIVE METABOLIC PANEL (CC13)
BUN: 14.9 mg/dL (ref 7.0–26.0)
CO2: 25 mEq/L (ref 22–29)
Calcium: 9.1 mg/dL (ref 8.4–10.4)
Chloride: 106 mEq/L (ref 98–109)
Creatinine: 1.1 mg/dL (ref 0.6–1.1)

## 2013-02-01 MED ORDER — SODIUM CHLORIDE 0.9 % IJ SOLN
10.0000 mL | INTRAMUSCULAR | Status: DC | PRN
  Administered 2013-02-01: 10 mL
  Filled 2013-02-01: qty 10

## 2013-02-01 MED ORDER — DEXAMETHASONE SODIUM PHOSPHATE 10 MG/ML IJ SOLN
10.0000 mg | Freq: Once | INTRAMUSCULAR | Status: AC
  Administered 2013-02-01: 10 mg via INTRAVENOUS

## 2013-02-01 MED ORDER — SODIUM CHLORIDE 0.9 % IV SOLN
Freq: Once | INTRAVENOUS | Status: AC
  Administered 2013-02-01: 11:00:00 via INTRAVENOUS

## 2013-02-01 MED ORDER — SODIUM CHLORIDE 0.9 % IV SOLN
130.6000 mg | Freq: Once | INTRAVENOUS | Status: AC
  Administered 2013-02-01: 130 mg via INTRAVENOUS
  Filled 2013-02-01: qty 13

## 2013-02-01 MED ORDER — SODIUM CHLORIDE 0.9 % IV SOLN
1000.0000 mg/m2 | Freq: Once | INTRAVENOUS | Status: AC
  Administered 2013-02-01: 1634 mg via INTRAVENOUS
  Filled 2013-02-01: qty 42.98

## 2013-02-01 MED ORDER — HEPARIN SOD (PORK) LOCK FLUSH 100 UNIT/ML IV SOLN
500.0000 [IU] | Freq: Once | INTRAVENOUS | Status: AC | PRN
  Administered 2013-02-01: 500 [IU]
  Filled 2013-02-01: qty 5

## 2013-02-01 MED ORDER — ONDANSETRON 8 MG/50ML IVPB (CHCC)
8.0000 mg | Freq: Once | INTRAVENOUS | Status: AC
  Administered 2013-02-01: 8 mg via INTRAVENOUS

## 2013-02-01 NOTE — Progress Notes (Signed)
ID: Rebecca Pugh   DOB: 1950/10/16  MR#: [id number]  CSN#:[id number]  PCP: [name], MD GYN: SU: [name] OTHER MD: [name]  HISTORY OF PRESENT ILLNESS: The patient and her husband, Rebecca Pugh, moved back to this area recently from [facility] (Rebecca Pugh actually is a native of [place] and incidentally remains a [employer] fan).  As part of moving boxes, Ms. Filler felt something under her left arm.  She brought it to [name] attention and although the patient states that she has always had some cyclical swelling of her left axilla during menstruation, [name] set the patient up for evaluation at [facility].  On 12-12-05 the patient had a mammogram and ultrasound there and these showed no mass, distortion or microcalcifications in either breast however, in the left axilla there was a large macrolobulated mass and just above that there was a slightly prominent lymph node which did demonstrate a fatty hilum.  Physically on exam the mass was mobile and nontender and measured approximately 6.5 cm.  The ultrasound showed two small hypoechoic nodules in the left breast 3 cm. from the nipple measuring 7 mm. each and felt to be most likely benign however, the axillary mass, of course, was quite suspicious and biopsy was performed the same day.  This showed ([id number] and [id number]) invasive breast cancer which was strongly ER positive at 76%, moderately PR positive at 25% with a very high proliferation marker at 66%.  HER-2/neu was 1+ and FISH showed no amplification with a ratio of 1.1.    With this information, the patient was referred to Dr. [name] and on 12-20-05 the patient had bilateral breast MRI's.  In the left breast there was a 5.8 cm. axillary mass and several abnormally enlarged lymph nodes.  There was a 7 mm. left supraclavicular lymph node and the two nodules in the breast that had been noted by ultrasound were also noted by MRI measuring 1.1 and 0.7 cm.  In the right breast there  were no abnormalities noted.  MR guided biopsies of the left breast masses were obtained on 12-27-05 and both showed high grade ductal carcinoma.  There was evidence of lymphovascular invasion and high grade ductal carcinoma in situ as well ([id number]).    [name] discussed the situation with me and we felt it would be useful to have a PET scan prior to definitive surgery.  This was obtained on 12-30-05 and showed the primary axillary mass to have an SUV of 8.5.  The lymph nodes in the left axilla had SUV's in the 2.2 range, small focus of increased activity in the left breast had an SUV of 2.3.  There was also skin thickening with mildly increased diffuse FDG uptake within the skin suggesting an inflammatory breast cancer.  The neck, abdomen and  pelvis were negative.  In the lung windows, there were several scattered small areas of focal nodular density in the upper lobes with compressive atelectasis and this was felt to be benign.  Chest x-ray on 01-09-06 was negative.  With this information, [name] proceeded, after appropriate discussion, to left modified radical mastectomy on 01-15-06 and the insertion of a BARD port at the same time.  The final pathology ([id number]) showed the maximum tumor size to be 5 cm.  Margins were ample.  There was evidence of lymphovascular invasion.  This infiltrating ductal carcinoma was a grade 3 of 3 and 11 out of 14 lymph nodes were involved.  There was evidence of  extracapsular extension.  Her subsequent history is as detailed below.   INTERVAL HISTORY: Rebecca Pugh returns today with her husband Rebecca Pugh for followup of her metastatic breast carcinoma. She's currently receiving carboplatin/gemcitabine, both agents given on days one and 8 of each 21 day cycle, and is due for day 1 cycle 3 today.   Rebecca Pugh and Rebecca Pugh just returned from a trip to [facility] where Rebecca Pugh had planned a huge surprise party for [place]. There were almost 70 people there, from all over the country, and  Rebecca Pugh was thrilled! She felt well during the trip, and tells me she also feels well today.  Other than mild fatigue, Rebecca Pugh had no additional complaints today. She still has right foot drop, but has been wearing a brace on the right foot which seems to help somewhat. She is very careful not to fall. The pain in her left shoulder has completely resolved at this point, and in fact she denies any pain whatsoever today.   REVIEW OF SYSTEMS: Rebecca Pugh has had no fevers or chills. Her energy level is much improved overall.  She denies any skin changes or rashes. She's had no abnormal bruising or bleeding. Her appetite has improved and she has gained a couple pounds. She denies any problems with nausea and has had no change in bowel or bladder habits. She had her ureter stents replaced on August 1 with no complications other than brief hematuria which has now resolved. She denies any abnormal headaches  or change in vision. She's had no increased cough or phlegm production. She does have shortness of breath with exertion but this is stable. She denies any orthopnea. She's had no chest pain or palpitations. She denies any peripheral swelling. Currently, she also denies any back pain and has had no additional myalgias, arthralgias, or bony pain. She still has peripheral neuropathy in both the upper and lower extremities, stable.   A detailed review of systems is otherwise stable.     PAST MEDICAL HISTORY: Past Medical History  Diagnosis Date  . Hypertension   . Depression   . Hydronephrosis, bilateral   . Breast cancer metastasized to multiple sites POSITIVE METS IN 2012-----  ONCOLOGIST--  DR Darnelle Catalan--  CURRENTLY RECEIVING CHEMO  EVERY 3 WEEKS (STARTED 06-15-2012)    FIRST DX 01-15-2006, INVASIVE DUCTAL GRADE III/    S/P LEFT MASTECTOMY AND CHEMORADIATION (CHEMO COMPLETE NOV 2007 AND XRT COMPLETE FEB 2008)  remote tonsillectomy, remote cholecystectomy, status post appendectomy, multiple skin biopsies for  what the patient said were simple keratoses, and status post port placement.  FAMILY HISTORY: The patient has no information regarding her father. She was brought up by her stepfather. The patient's mother died at the age of 59 with bladder cancer. The patient has one sister who is in good health. There is no history of breast or ovarian cancer in the family to the patient's knowledge.   GYNECOLOGIC HISTORY: She is GX, P2. She was premenopausal at the time of her initial breast cancer diagnosis  SOCIAL HISTORY: She and her husband, Rebecca Pugh, have been married 10 years. 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 She has two children, a daughter, [name], who lives in [place] and is a Investment banker, corporate, and a son, [name], also in [place], who works for [employer]. She has two grandchildren and three step-grandchildren from her own two children, [name] and [name], who are from her first marriage.  Rebecca Pugh has one daughter from his first marriage and he has grandchildren through her. The patient was brought up as a Catholic but currently is not practicing. She and Rebecca Pugh are attending a [employer].      ADVANCED DIRECTIVES:  HEALTH MAINTENANCE: History  Substance Use Topics  . Smoking status: Former Smoker -- 20 years    Quit date: 01/31/1995  . Smokeless tobacco: Never Used  . Alcohol Use: Yes     Comment: occasionl     Colonoscopy:  PAP:  Bone density: August 2012, "Normal"  Lipid panel:  No Known Allergies  Current Outpatient Prescriptions  Medication Sig Dispense Refill  . calcium citrate (CALCITRATE - DOSED IN MG ELEMENTAL CALCIUM) 950 MG tablet Take 1 tablet by mouth daily.       . carvedilol (COREG) 3.125 MG tablet Take 3.125 mg by mouth at bedtime. Decreased to once daily at night per [name] as of 01/11/2013 d/t hypotension (88/54)      . cholecalciferol (VITAMIN D) 1000 UNITS tablet Take  3,000 Units by mouth daily.       . cholestyramine (QUESTRAN) 4 G packet Take 1 packet by mouth 2 (two) times daily with a meal. Mix with water.      . Desloratadine-Pseudoephedrine (CLARINEX-D 24 HOUR PO) Take 1 capsule by mouth daily.      Marland Kitchen FLUoxetine (PROZAC) 40 MG capsule TAKE ONE CAPSULE BY MOUTH ONCE DAILY  30 capsule  6  . gabapentin (NEURONTIN) 300 MG capsule Take 1 capsule (300 mg total) by mouth at bedtime.  30 capsule  5  . letrozole (FEMARA) 2.5 MG tablet       . lidocaine (XYLOCAINE) 5 % ointment Apply topically 2 (two) times daily as needed.  30 g  1  . lidocaine-prilocaine (EMLA) cream Apply topically as needed.  30 g  3  . loperamide (IMODIUM) 2 MG capsule Take 2 mg by mouth 4 (four) times daily as needed. diarrhea      . LORazepam (ATIVAN) 0.5 MG tablet Take 1 tablet (0.5 mg total) by mouth 2 (two) times daily as needed.  60 tablet  0  . losartan (COZAAR) 50 MG tablet Take 0.5 tablets (25 mg total) by mouth daily.  30 tablet  6  . metoCLOPramide (REGLAN) 10 MG tablet Take 0.5 tablets (5 mg total) by mouth 4 (four) times daily -  before meals and at bedtime.  120 tablet  12  . omeprazole (PRILOSEC) 40 MG capsule Take 1 capsule (40 mg total) by mouth at bedtime.  30 capsule  12  . ondansetron (ZOFRAN) 8 MG tablet Take 8 mg by mouth 2 (two) times daily as needed for nausea.      Marland Kitchen oxybutynin (DITROPAN) 5 MG tablet       . oxyCODONE (OXY IR/ROXICODONE) 5 MG immediate release tablet Take 1 tablet (5 mg total) by mouth every 6 (six) hours as needed.  30 tablet  0  . potassium chloride (MICRO-K) 10 MEQ CR capsule Take 2 capsules (20 mEq total) by mouth daily.  60 capsule  1  . predniSONE (DELTASONE) 5 MG tablet TAKE ONE TABLET BY MOUTH ONCE DAILY.  TAKE FOR 4 DAYS AFTER DISCONTINUING DEXAMETHASONE  30 tablet  0  . prochlorperazine (COMPAZINE) 10 MG tablet Take 1 tablet (10 mg total) by mouth every 6 (six) hours as needed.  30 tablet  3  . tobramycin-dexamethasone (TOBRADEX) ophthalmic  solution Place 1 drop into both eyes 2 (two) times daily.  5 mL  1  . valACYclovir (VALTREX) 1000 MG tablet Take 1 tablet (1,000 mg total) by mouth 2 (two) times daily.  28 tablet  0   No current facility-administered medications for this visit.   Facility-Administered Medications Ordered in Other Visits  Medication Dose Route Frequency Provider Last Rate Last Dose  . heparin lock flush 100 unit/mL  500 Units Intravenous Once Lowella Dell, MD      . sodium chloride 0.9 % injection 10 mL  10 mL Intravenous PRN Lowella Dell, MD         OBJECTIVE:  Filed Vitals:   02/01/13 0943  BP: 109/58  Pulse: 84  Temp: 98.5 F (36.9 C)  Resp: 20  Body mass index is 22.39 kg/(m^2). Filed Weights   02/01/13 0943  Weight: 130 lb 8 oz (59.194 kg)  Middle-aged white female who appears positive and in no acute distress. ECOG: 2  Sclerae unicteric Oropharynx clear,  no ulcerations.  Palpable fullness in the cervical and supraclavicular regions bilaterally, and I'm able to palpate a slightly enlarged lymph node on the left side, possibly slightly greater than 0.5 cm in size.  Lungs clear to auscultation, no rales or rhonchi Heart regular rate and rhythm Abdomen nontender, soft to palpation, positive bowel sounds MSK  no peripheral edema, continued right foot drop which is affecting gait, a brace is intact on the right ankle Neuro: nonfocal, well oriented, appropriate affect Breasts: Deferred. No axillary adenopathy palpated.    LAB RESULTS:  Lab Results  Component Value Date   WBC 3.7* 02/01/2013   NEUTROABS 2.1 02/01/2013   HGB 10.1* 02/01/2013   HCT 31.0* 02/01/2013   MCV 91.2 02/01/2013   PLT 240 02/01/2013      Chemistry      Component Value Date/Time   NA 140 01/18/2013 1102   NA 140 01/15/2013 0748   NA 142 11/05/2010 1036   K 3.5 01/18/2013 1102   K 3.2* 01/15/2013 0748   K 4.5 11/05/2010 1036   CL 102 01/15/2013 0748   CL 99 11/30/2012 0835   CL 99 11/05/2010 1036   CO2 26 01/18/2013  1102   CO2 25 02/01/2012 0500   CO2 26 11/05/2010 1036   BUN 17.8 01/18/2013 1102   BUN 18 01/15/2013 0748   BUN 14 11/05/2010 1036   CREATININE 1.1 01/18/2013 1102   CREATININE 1.20* 01/15/2013 0748   CREATININE 0.8 11/05/2010 1036      Component Value Date/Time   CALCIUM 9.2 01/18/2013 1102   CALCIUM 8.6 02/01/2012 0500   CALCIUM 8.8 11/05/2010 1036   ALKPHOS 83 01/18/2013 1102   ALKPHOS 88 01/08/2012 0916   ALKPHOS 106* 11/05/2010 1036   AST 29 01/18/2013 1102   AST 24 01/08/2012 0916   AST 27 11/05/2010 1036   ALT 23 01/18/2013 1102   ALT 27 01/08/2012 0916   ALT 35 11/05/2010 1036   BILITOT 0.53 01/18/2013 1102   BILITOT 0.9 01/08/2012 0916   BILITOT 0.80 11/05/2010 1036       Lab Results  Component Value Date   LABCA2 107* 08/17/2012    STUDIES:  Mr Laqueta Jean WG Contrast  12/05/2012   *RADIOLOGY REPORT*  Clinical Data: Breast cancer.  Newly discovered liver metastases.  MRI HEAD WITHOUT AND WITH CONTRAST  Technique:  Multiplanar, multiecho pulse sequences of the brain and surrounding structures were obtained according to standard protocol without and with intravenous contrast  Contrast: 6mL MULTIHANCE GADOBENATE DIMEGLUMINE 529 MG/ML IV  SOLN  Comparison: CT head cervical 11/09/2011. MRI brain 12/04/2010.  Findings: No acute stroke or hemorrhage.  No mass lesion or hydrocephalus.  No extra-axial fluid.  Normal cerebral volume. Minor white matter changes are suspected in the periventricular greater than subcortical regions, also in the mid pons, suggesting chronic microvascular ischemic change and/or post treatment effect. There is no midline shift.  Flow voids are maintained in the major intracranial vessels.  Post infusion, there is no abnormal enhancement of the brain or meninges. There are no definite osseous metastases.  No skull base or upper cervical lesions.  Normal midline structures. The orbits, sinuses, and mastoids appear unremarkable. Pathologic cervical adenopathy is incompletely evaluated on  this brain exam.  Prominence of the left jugular bulb may reflect nodal disease in the neck.  IMPRESSION: Unremarkable cranial MRI. Minor changes of small vessel disease. No evidence intracranial metastatic disease.   Original Report Authenticated By: Davonna Belling, M.D.   Mr Shoulder Left W Contrast  12/26/2012   *RADIOLOGY REPORT*  Clinical Data: Left shoulder pain.  History of breast cancer.  MRI LEFT SHOULDER WITH CONTRAST  Technique:  Multiplanar, multisequence MR imaging of the left shoulder was performed following the administration of intravenous contrast.  Contrast: 6mL MULTIHANCE GADOBENATE DIMEGLUMINE 529 MG/ML IV SOLN  Comparison:  CT scan chest, abdomen and pelvis 11/17/2012.  Findings:  There is no evidence of metastatic disease.  Mildly increased T2 signal in the supraspinatus and infraspinatus tendons consistent with tendinopathy is identified.  The tendons are intact.  The subscapularis appears normal.  The long head of biceps tendon is intact.  The labrum is unremarkable.  The patient has moderate acromioclavicular degenerative change.  The acromion is type 2.  A small amount of fluid is present in the subacromial/subdeltoid bursa.  IMPRESSION:  1.  Mild to moderate appearing supraspinatus and infraspinatus tendinopathy without tear. 2.  Acromioclavicular osteoarthritis. 3.  Small amount fluid in the subacromial/subdeltoid bursa compatible with bursitis. 4.  Negative for metastatic disease.   Original Report Authenticated By: Holley Dexter, M.D.    ASSESSMENT: 62 y.o.  Rebecca Pugh with stage IV breast cancer.  1. 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 2. Status post 4 cycles of adjuvant docetaxel, carboplatin and trastuzumab completed November 2007. 3. Postmastectomy radiation completed February 2008. 4. Anastrozole begun February 2008 (the trastuzumab also was continued  to complete a year), continued until June 2012.  5. 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 with left retrocrural and retroperitoneal adenopathy.  6. On tamoxifen/lapatinib/trastuzumab between June 2012 and October 2012.  7. Ixempra/lapatinib/trastuzumab started October 2012, the Ixempra discontinued in February 2013 due to peripheral neuropathy. 8. Continuing on lapatinib/trastuzumab with the addition of fulvestrant, first injections given on 07/31/2011. Lapatinib given at a dose of 1 g (4 tablets) daily, and trastuzumab given at 8 mg/kg every 4 weeks to coordinate with injection appointments. 9. Discontinued lapatinib and fulvestrant in December 2013 10. Started q. three-week docetaxel/ trastuzumab/ pertuzumab, first dose given 06/15/2012.  Docetaxel given at a 50% dose reduction beginning with cycle 2 due to poor tolerance and severe neutropenia. Completed 6 cycles as of 09/28/2012.  11. Trastuzumab/ pertuzumab continued  to June of 2014, with evidence of disease progression. Letrozole added 10/19/2012. 12. Bilateral hydronephrosis noted March 2014, bilateral ureteral  stenting performed 09/11/2012 under Dr Brunilda Payor 13. Herpes Zoster June 2014, resolved 14. 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  An echocardiogram on 12/07/2012 showed a reduced ejection fraction of 40%, likely secondary to previous treatment with trastuzumab. Subsequently, TDM-1 was discontinued. 15. Starting on treatment with carboplatin/gemcitabine, both agents to be given on days one and 8 of each 21 day cycle, first dose on 12/21/2012.  Carboplatin will be given at an AUC of 2, the gemcitabine at 1000 mg per meter square. The plan is to complete 3 cycles, then restage.    PLAN:  Rebecca Pugh will proceed to treatment today as scheduled for day 1 cycle 3 of carboplatin/gemcitabine. I will see  her again in one week prior to day 8 cycle 3, after which we will repeat all staging scans. This will include CTs of the chest/abdomen/pelvis and neck in addition to a PET scan in late August. She will then see [name] on September 2 to review those results and discuss her treatment plan.  I making no changes in Rebecca Pugh's treatment regimen today. Rebecca Pugh and Rebecca Pugh both voice understanding and agreement with this plan thus far. As always, they'll call with any changes or problems.     Rebecca Pugh    02/01/2013

## 2013-02-01 NOTE — Patient Instructions (Addendum)
[facility] Discharge Instructions for Patients Receiving Chemotherapy  Today you received the following chemotherapy agents Gemzar and Carboplatin  To help prevent nausea and vomiting after your treatment, we encourage you to take your nausea medication as prescribed   If you develop nausea and vomiting that is not controlled by your nausea medication, call the clinic.   BELOW ARE SYMPTOMS THAT SHOULD BE REPORTED IMMEDIATELY:  *FEVER GREATER THAN 100.5 F  *CHILLS WITH OR WITHOUT FEVER  NAUSEA AND VOMITING THAT IS NOT CONTROLLED WITH YOUR NAUSEA MEDICATION  *UNUSUAL SHORTNESS OF BREATH  *UNUSUAL BRUISING OR BLEEDING  TENDERNESS IN MOUTH AND THROAT WITH OR WITHOUT PRESENCE OF ULCERS  *URINARY PROBLEMS  *BOWEL PROBLEMS  UNUSUAL RASH Items with * indicate a potential emergency and should be followed up as soon as possible.  Feel free to call the clinic you have any questions or concerns. The clinic phone number is [phone].    

## 2013-02-08 ENCOUNTER — Telehealth: Payer: Self-pay | Admitting: *Deleted

## 2013-02-08 ENCOUNTER — Encounter: Payer: Self-pay | Admitting: Physician Assistant

## 2013-02-08 ENCOUNTER — Ambulatory Visit (HOSPITAL_BASED_OUTPATIENT_CLINIC_OR_DEPARTMENT_OTHER): Payer: Medicare Other

## 2013-02-08 ENCOUNTER — Other Ambulatory Visit (HOSPITAL_BASED_OUTPATIENT_CLINIC_OR_DEPARTMENT_OTHER): Payer: Medicare Other | Admitting: Lab

## 2013-02-08 ENCOUNTER — Ambulatory Visit (HOSPITAL_BASED_OUTPATIENT_CLINIC_OR_DEPARTMENT_OTHER): Payer: Medicare Other | Admitting: Physician Assistant

## 2013-02-08 VITALS — BP 94/63 | HR 98 | Temp 98.3°F | Resp 18 | Ht 64.0 in | Wt 125.2 lb

## 2013-02-08 DIAGNOSIS — C787 Secondary malignant neoplasm of liver and intrahepatic bile duct: Secondary | ICD-10-CM

## 2013-02-08 DIAGNOSIS — Z170 Estrogen receptor positive status [ER+]: Secondary | ICD-10-CM

## 2013-02-08 DIAGNOSIS — Z5111 Encounter for antineoplastic chemotherapy: Secondary | ICD-10-CM

## 2013-02-08 DIAGNOSIS — D630 Anemia in neoplastic disease: Secondary | ICD-10-CM

## 2013-02-08 DIAGNOSIS — C7970 Secondary malignant neoplasm of unspecified adrenal gland: Secondary | ICD-10-CM

## 2013-02-08 DIAGNOSIS — C50219 Malignant neoplasm of upper-inner quadrant of unspecified female breast: Secondary | ICD-10-CM

## 2013-02-08 DIAGNOSIS — C773 Secondary and unspecified malignant neoplasm of axilla and upper limb lymph nodes: Secondary | ICD-10-CM

## 2013-02-08 DIAGNOSIS — C50912 Malignant neoplasm of unspecified site of left female breast: Secondary | ICD-10-CM

## 2013-02-08 DIAGNOSIS — R630 Anorexia: Secondary | ICD-10-CM

## 2013-02-08 DIAGNOSIS — F419 Anxiety disorder, unspecified: Secondary | ICD-10-CM

## 2013-02-08 DIAGNOSIS — C801 Malignant (primary) neoplasm, unspecified: Secondary | ICD-10-CM

## 2013-02-08 DIAGNOSIS — N1330 Unspecified hydronephrosis: Secondary | ICD-10-CM

## 2013-02-08 DIAGNOSIS — M21371 Foot drop, right foot: Secondary | ICD-10-CM

## 2013-02-08 LAB — CBC WITH DIFFERENTIAL/PLATELET
BASO%: 1.1 % (ref 0.0–2.0)
Eosinophils Absolute: 0 10*3/uL (ref 0.0–0.5)
HCT: 33.2 % — ABNORMAL LOW (ref 34.8–46.6)
HGB: 11.3 g/dL — ABNORMAL LOW (ref 11.6–15.9)
LYMPH%: 36.2 % (ref 14.0–49.7)
MONO#: 0.4 10*3/uL (ref 0.1–0.9)
NEUT#: 1.9 10*3/uL (ref 1.5–6.5)
NEUT%: 50 % (ref 38.4–76.8)
Platelets: 263 10*3/uL (ref 145–400)
WBC: 3.8 10*3/uL — ABNORMAL LOW (ref 3.9–10.3)
lymph#: 1.4 10*3/uL (ref 0.9–3.3)

## 2013-02-08 LAB — COMPREHENSIVE METABOLIC PANEL (CC13)
ALT: 31 U/L (ref 0–55)
CO2: 24 mEq/L (ref 22–29)
Calcium: 9.8 mg/dL (ref 8.4–10.4)
Chloride: 102 mEq/L (ref 98–109)
Creatinine: 1.2 mg/dL — ABNORMAL HIGH (ref 0.6–1.1)
Glucose: 129 mg/dl (ref 70–140)
Sodium: 140 mEq/L (ref 136–145)
Total Bilirubin: 0.84 mg/dL (ref 0.20–1.20)
Total Protein: 7.3 g/dL (ref 6.4–8.3)

## 2013-02-08 MED ORDER — ONDANSETRON 8 MG/50ML IVPB (CHCC)
8.0000 mg | Freq: Once | INTRAVENOUS | Status: AC
  Administered 2013-02-08: 8 mg via INTRAVENOUS

## 2013-02-08 MED ORDER — SODIUM CHLORIDE 0.9 % IJ SOLN
10.0000 mL | INTRAMUSCULAR | Status: DC | PRN
  Administered 2013-02-08: 10 mL
  Filled 2013-02-08: qty 10

## 2013-02-08 MED ORDER — SODIUM CHLORIDE 0.9 % IV SOLN
Freq: Once | INTRAVENOUS | Status: AC
  Administered 2013-02-08: 13:00:00 via INTRAVENOUS

## 2013-02-08 MED ORDER — SODIUM CHLORIDE 0.9 % IV SOLN
1000.0000 mg/m2 | Freq: Once | INTRAVENOUS | Status: AC
  Administered 2013-02-08: 1634 mg via INTRAVENOUS
  Filled 2013-02-08: qty 42.98

## 2013-02-08 MED ORDER — DEXAMETHASONE SODIUM PHOSPHATE 10 MG/ML IJ SOLN
10.0000 mg | Freq: Once | INTRAMUSCULAR | Status: AC
  Administered 2013-02-08: 10 mg via INTRAVENOUS

## 2013-02-08 MED ORDER — CARBOPLATIN CHEMO INJECTION 450 MG/45ML
130.6000 mg | Freq: Once | INTRAVENOUS | Status: AC
  Administered 2013-02-08: 130 mg via INTRAVENOUS
  Filled 2013-02-08: qty 13

## 2013-02-08 MED ORDER — HEPARIN SOD (PORK) LOCK FLUSH 100 UNIT/ML IV SOLN
500.0000 [IU] | Freq: Once | INTRAVENOUS | Status: AC | PRN
  Administered 2013-02-08: 500 [IU]
  Filled 2013-02-08: qty 5

## 2013-02-08 NOTE — Progress Notes (Signed)
ID: [name]   DOB: Jan 27, 1951  MR#: [id number]  CSN#:[id number]  PCP: [name], MD GYN: [name] OTHER MD: [name]  HISTORY OF PRESENT ILLNESS: The patient and her husband, Gene, moved back to this area in 2007 from [place] (Gene actually is a native of [place] and incidentally remains a Herbalist).  As part of moving boxes, Ms. Firman felt something under her left arm.  She brought it to [name] attention and although the patient states that she has always had some cyclical swelling of her left axilla during menstruation, [name] set the patient up for evaluation at [facility].  On 12-12-05 the patient had a mammogram and ultrasound there and these showed no mass, distortion or microcalcifications in either breast however, in the left axilla there was a large macrolobulated mass and just above that there was a slightly prominent lymph node which did demonstrate a fatty hilum.  Physically on exam the mass was mobile and nontender and measured approximately 6.5 cm.  The ultrasound showed two small hypoechoic nodules in the left breast 3 cm. from the nipple measuring 7 mm. each and felt to be most likely benign however, the axillary mass, of course, was quite suspicious and biopsy was performed the same day.  This showed ([id number] and [id number]) invasive breast cancer which was strongly ER positive at 76%, moderately PR positive at 25% with a very high proliferation marker at 66%.  HER-2/neu was 1+ and FISH showed no amplification with a ratio of 1.1.    With this information, the patient was referred to Dr. [name] and on 12-20-05 the patient had bilateral breast MRI's.  In the left breast there was a 5.8 cm. axillary mass and several abnormally enlarged lymph nodes.  There was a 7 mm. left supraclavicular lymph node and the two nodules in the breast that had been noted by ultrasound were also noted by MRI measuring 1.1 and 0.7 cm.  In the right breast there  were no abnormalities noted.  MR guided biopsies of the left breast masses were obtained on 12-27-05 and both showed high grade ductal carcinoma.  There was evidence of lymphovascular invasion and high grade ductal carcinoma in situ as well ([id number]).    [name] discussed the situation with me and we felt it would be useful to have a PET scan prior to definitive surgery.  This was obtained on 12-30-05 and showed the primary axillary mass to have an SUV of 8.5.  The lymph nodes in the left axilla had SUV's in the 2.2 range, small focus of increased activity in the left breast had an SUV of 2.3.  There was also skin thickening with mildly increased diffuse FDG uptake within the skin suggesting an inflammatory breast cancer.  The neck, abdomen and  pelvis were negative.  In the lung windows, there were several scattered small areas of focal nodular density in the upper lobes with compressive atelectasis and this was felt to be benign.  Chest x-ray on 01-09-06 was negative.  With this information, [name] proceeded, after appropriate discussion, to left modified radical mastectomy on 01-15-06 and the insertion of a BARD port at the same time.  The final pathology ([id number]) showed the maximum tumor size to be 5 cm.  Margins were ample.  There was evidence of lymphovascular invasion.  This infiltrating ductal carcinoma was a grade 3 of 3 and 11 out of 14 lymph nodes were involved.  There was evidence  of extracapsular extension.  Her subsequent history is as detailed below.   INTERVAL HISTORY: Shirel returns today with her husband Gene for followup of her metastatic breast carcinoma. She's currently receiving carboplatin/gemcitabine, both agents given on days one and 8 of each 21 day cycle, and is due for day 8 cycle 3 today.   Sueellen continues to tolerate the treatment well, and her biggest complaint is a decreased appetite. She's lost approximately 5 pounds over the past week. She's also finding it  difficult to keep herself well hydrated. She tells me she feels full very quickly and has some mild taste aversion. Fortunately, she denies any nausea or emesis. She continues to have regular bowel movements. She's had no mouth ulcers, oral sensitivity, or problems swallowing.   REVIEW OF SYSTEMS: Tatem has had no fevers or chills. Her energy level is fair.  She denies any skin changes or rashes and has had no abnormal bruising or bleeding.  She denies any abnormal headaches  or change in vision. She's had no increased cough or phlegm production. She does have shortness of breath with exertion but this is stable. She denies any orthopnea. She's had no chest pain or palpitations. She denies any peripheral swelling. She still has problems with foot drop on the right, and is wearing a brace. She's very cautious with walking, and fortunately has had no falls. Currently, she denies any back pain and has had no additional myalgias, arthralgias, or bony pain. She still has peripheral neuropathy in both the upper and lower extremities, stable.   A detailed review of systems is otherwise stable.     PAST MEDICAL HISTORY: Past Medical History  Diagnosis Date  . Hypertension   . Depression   . Hydronephrosis, bilateral   . Breast cancer metastasized to multiple sites POSITIVE METS IN 2012-----  ONCOLOGIST--  DR Darnelle Catalan--  CURRENTLY RECEIVING CHEMO  EVERY 3 WEEKS (STARTED 06-15-2012)    FIRST DX 01-15-2006, INVASIVE DUCTAL GRADE III/    S/P LEFT MASTECTOMY AND CHEMORADIATION (CHEMO COMPLETE NOV 2007 AND XRT COMPLETE FEB 2008)  remote tonsillectomy, remote cholecystectomy, status post appendectomy, multiple skin biopsies for what the patient said were simple keratoses, and status post port placement.  FAMILY HISTORY: The patient has no information regarding her father. She was brought up by her stepfather. The patient's mother died at the age of 68 with bladder cancer. The patient has one sister who is in  good health. There is no history of breast or ovarian cancer in the family to the patient's knowledge.   GYNECOLOGIC HISTORY: She is GX, P2. She was premenopausal at the time of her initial breast cancer diagnosis  SOCIAL HISTORY: She and her husband, Gene, have been married 10 years. 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 She has two children, a daughter, [name], who lives in [place] and is a Investment banker, corporate, and a son, [name], also in [place], who works for [employer]. She has two grandchildren and three step-grandchildren from her own two children, [name] and [name], who are from her first marriage. Gene has one daughter from his first marriage and he has grandchildren through her. The patient was brought up as a Catholic but currently is not practicing. She and Gene are attending a [employer].      ADVANCED DIRECTIVES:  HEALTH MAINTENANCE: History  Substance Use Topics  . Smoking status: Former Smoker -- 20 years  Quit date: 01/31/1995  . Smokeless tobacco: Never Used  . Alcohol Use: Yes     Comment: occasionl     Colonoscopy:  PAP:  Bone density: August 2012, "Normal"  Lipid panel:  No Known Allergies  Current Outpatient Prescriptions  Medication Sig Dispense Refill  . calcium citrate (CALCITRATE - DOSED IN MG ELEMENTAL CALCIUM) 950 MG tablet Take 1 tablet by mouth daily.       . carvedilol (COREG) 3.125 MG tablet Take 3.125 mg by mouth at bedtime. Decreased to once daily at night per [name] as of 01/11/2013 d/t hypotension (88/54)      . cholecalciferol (VITAMIN D) 1000 UNITS tablet Take 3,000 Units by mouth daily.       . cholestyramine (QUESTRAN) 4 G packet Take 1 packet by mouth 2 (two) times daily with a meal. Mix with water.      . Desloratadine-Pseudoephedrine (CLARINEX-D 24 HOUR PO) Take 1 capsule by mouth daily.      Marland Kitchen FLUoxetine (PROZAC) 40 MG capsule TAKE ONE  CAPSULE BY MOUTH ONCE DAILY  30 capsule  6  . gabapentin (NEURONTIN) 300 MG capsule Take 1 capsule (300 mg total) by mouth at bedtime.  30 capsule  5  . letrozole (FEMARA) 2.5 MG tablet       . lidocaine (XYLOCAINE) 5 % ointment Apply topically 2 (two) times daily as needed.  30 g  1  . lidocaine-prilocaine (EMLA) cream Apply topically as needed.  30 g  3  . loperamide (IMODIUM) 2 MG capsule Take 2 mg by mouth 4 (four) times daily as needed. diarrhea      . LORazepam (ATIVAN) 0.5 MG tablet Take 1 tablet (0.5 mg total) by mouth 2 (two) times daily as needed.  60 tablet  0  . losartan (COZAAR) 50 MG tablet Take 0.5 tablets (25 mg total) by mouth daily.  30 tablet  6  . metoCLOPramide (REGLAN) 10 MG tablet Take 0.5 tablets (5 mg total) by mouth 4 (four) times daily -  before meals and at bedtime.  120 tablet  12  . omeprazole (PRILOSEC) 40 MG capsule Take 1 capsule (40 mg total) by mouth at bedtime.  30 capsule  12  . ondansetron (ZOFRAN) 8 MG tablet Take 8 mg by mouth 2 (two) times daily as needed for nausea.      Marland Kitchen oxybutynin (DITROPAN) 5 MG tablet       . oxyCODONE (OXY IR/ROXICODONE) 5 MG immediate release tablet Take 1 tablet (5 mg total) by mouth every 6 (six) hours as needed.  30 tablet  0  . potassium chloride (MICRO-K) 10 MEQ CR capsule Take 2 capsules (20 mEq total) by mouth daily.  60 capsule  1  . predniSONE (DELTASONE) 5 MG tablet TAKE ONE TABLET BY MOUTH ONCE DAILY.  TAKE FOR 4 DAYS AFTER DISCONTINUING DEXAMETHASONE  30 tablet  0  . prochlorperazine (COMPAZINE) 10 MG tablet Take 1 tablet (10 mg total) by mouth every 6 (six) hours as needed.  30 tablet  3  . tobramycin-dexamethasone (TOBRADEX) ophthalmic solution Place 1 drop into both eyes 2 (two) times daily.  5 mL  1  . valACYclovir (VALTREX) 1000 MG tablet Take 1 tablet (1,000 mg total) by mouth 2 (two) times daily.  28 tablet  0   No current facility-administered medications for this visit.   Facility-Administered Medications  Ordered in Other Visits  Medication Dose Route Frequency Provider Last Rate Last Dose  . heparin lock flush 100  unit/mL  500 Units Intravenous Once Lowella Dell, MD      . sodium chloride 0.9 % injection 10 mL  10 mL Intravenous PRN Lowella Dell, MD         OBJECTIVE:  Filed Vitals:   02/08/13 1131  BP: 94/63  Pulse: 98  Temp: 98.3 F (36.8 C)  Resp: 18  Body mass index is 21.48 kg/(m^2). Filed Weights   02/08/13 1131  Weight: 125 lb 3 oz (56.785 kg)  Middle-aged white female who appears positive but tired and is in no acute distress. ECOG: 2  Sclerae unicteric Oropharynx clear,  no ulcerations, no evidence of thrush.  Palpable fullness in the cervical and supraclavicular regions bilaterally, and I'm able to palpate only a slightly enlarged lymph node on the left side, possibly slightly greater than 0.5 cm in size. There is definite improvement over the last several weeks. Lungs clear to auscultation, no rales or rhonchi Heart regular rate and rhythm Abdomen nontender, soft to palpation, positive bowel sounds MSK  no peripheral edema, continued right foot drop which is affecting gait, a brace is intact on the right ankle Neuro: nonfocal, well oriented, appropriate affect Breasts: Deferred. No axillary adenopathy palpated.    LAB RESULTS:  Lab Results  Component Value Date   WBC 3.8* 02/08/2013   NEUTROABS 1.9 02/08/2013   HGB 11.3* 02/08/2013   HCT 33.2* 02/08/2013   MCV 90.6 02/08/2013   PLT 263 02/08/2013      Chemistry      Component Value Date/Time   NA 140 02/08/2013 1051   NA 140 01/15/2013 0748   NA 142 11/05/2010 1036   K 4.4 02/08/2013 1051   K 3.2* 01/15/2013 0748   K 4.5 11/05/2010 1036   CL 102 01/15/2013 0748   CL 99 11/30/2012 0835   CL 99 11/05/2010 1036   CO2 24 02/08/2013 1051   CO2 25 02/01/2012 0500   CO2 26 11/05/2010 1036   BUN 19.1 02/08/2013 1051   BUN 18 01/15/2013 0748   BUN 14 11/05/2010 1036   CREATININE 1.2* 02/08/2013 1051   CREATININE 1.20*  01/15/2013 0748   CREATININE 0.8 11/05/2010 1036      Component Value Date/Time   CALCIUM 9.8 02/08/2013 1051   CALCIUM 8.6 02/01/2012 0500   CALCIUM 8.8 11/05/2010 1036   ALKPHOS 90 02/08/2013 1051   ALKPHOS 88 01/08/2012 0916   ALKPHOS 106* 11/05/2010 1036   AST 29 02/08/2013 1051   AST 24 01/08/2012 0916   AST 27 11/05/2010 1036   ALT 31 02/08/2013 1051   ALT 27 01/08/2012 0916   ALT 35 11/05/2010 1036   BILITOT 0.84 02/08/2013 1051   BILITOT 0.9 01/08/2012 0916   BILITOT 0.80 11/05/2010 1036       Lab Results  Component Value Date   LABCA2 107* 08/17/2012    STUDIES:  Mr Laqueta Jean ZO Contrast  12/05/2012   *RADIOLOGY REPORT*  Clinical Data: Breast cancer.  Newly discovered liver metastases.  MRI HEAD WITHOUT AND WITH CONTRAST  Technique:  Multiplanar, multiecho pulse sequences of the brain and surrounding structures were obtained according to standard protocol without and with intravenous contrast  Contrast: 6mL MULTIHANCE GADOBENATE DIMEGLUMINE 529 MG/ML IV SOLN  Comparison: CT head cervical 11/09/2011. MRI brain 12/04/2010.  Findings: No acute stroke or hemorrhage.  No mass lesion or hydrocephalus.  No extra-axial fluid.  Normal cerebral volume. Minor white matter changes are suspected in the periventricular greater than subcortical regions, also  in the mid pons, suggesting chronic microvascular ischemic change and/or post treatment effect. There is no midline shift.  Flow voids are maintained in the major intracranial vessels.  Post infusion, there is no abnormal enhancement of the brain or meninges. There are no definite osseous metastases.  No skull base or upper cervical lesions.  Normal midline structures. The orbits, sinuses, and mastoids appear unremarkable. Pathologic cervical adenopathy is incompletely evaluated on this brain exam.  Prominence of the left jugular bulb may reflect nodal disease in the neck.  IMPRESSION: Unremarkable cranial MRI. Minor changes of small vessel disease. No  evidence intracranial metastatic disease.   Original Report Authenticated By: Davonna Belling, M.D.   Mr Shoulder Left W Contrast  12/26/2012   *RADIOLOGY REPORT*  Clinical Data: Left shoulder pain.  History of breast cancer.  MRI LEFT SHOULDER WITH CONTRAST  Technique:  Multiplanar, multisequence MR imaging of the left shoulder was performed following the administration of intravenous contrast.  Contrast: 6mL MULTIHANCE GADOBENATE DIMEGLUMINE 529 MG/ML IV SOLN  Comparison:  CT scan chest, abdomen and pelvis 11/17/2012.  Findings:  There is no evidence of metastatic disease.  Mildly increased T2 signal in the supraspinatus and infraspinatus tendons consistent with tendinopathy is identified.  The tendons are intact.  The subscapularis appears normal.  The long head of biceps tendon is intact.  The labrum is unremarkable.  The patient has moderate acromioclavicular degenerative change.  The acromion is type 2.  A small amount of fluid is present in the subacromial/subdeltoid bursa.  IMPRESSION:  1.  Mild to moderate appearing supraspinatus and infraspinatus tendinopathy without tear. 2.  Acromioclavicular osteoarthritis. 3.  Small amount fluid in the subacromial/subdeltoid bursa compatible with bursitis. 4.  Negative for metastatic disease.   Original Report Authenticated By: Holley Dexter, M.D.    ASSESSMENT: 62 y.o.  [facility] woman with stage IV breast cancer.  1. 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 2. Status post 4 cycles of adjuvant docetaxel, carboplatin and trastuzumab completed November 2007. 3. Postmastectomy radiation completed February 2008. 4. Anastrozole begun February 2008 (the trastuzumab also was continued to complete a year), continued until June 2012.  5. 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 with left retrocrural and retroperitoneal adenopathy.  6. On tamoxifen/lapatinib/trastuzumab between June 2012 and October 2012.  7. Ixempra/lapatinib/trastuzumab started October 2012, the Ixempra discontinued in February 2013 due to peripheral neuropathy. 8. Continuing on lapatinib/trastuzumab with the addition of fulvestrant, first injections given on 07/31/2011. Lapatinib given at a dose of 1 g (4 tablets) daily, and trastuzumab given at 8 mg/kg every 4 weeks to coordinate with injection appointments. 9. Discontinued lapatinib and fulvestrant in December 2013 10. Started q. three-week docetaxel/ trastuzumab/ pertuzumab, first dose given 06/15/2012.  Docetaxel given at a 50% dose reduction beginning with cycle 2 due to poor tolerance and severe neutropenia. Completed 6 cycles as of 09/28/2012.  11. Trastuzumab/ pertuzumab continued  to June of 2014, with evidence of disease progression. Letrozole added 10/19/2012. 12. Bilateral hydronephrosis noted March 2014, bilateral ureteral stenting performed 09/11/2012 under Dr Brunilda Payor 13. Herpes Zoster June 2014, resolved 14. 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  An echocardiogram on 12/07/2012 showed a reduced ejection fraction of 40%, likely secondary to previous treatment with trastuzumab. Subsequently, TDM-1 was discontinued. 15. Starting  on treatment with carboplatin/gemcitabine, both agents to be given on days one and 8 of each 21 day cycle, first dose on 12/21/2012.  Carboplatin will be given at an AUC of 2, the gemcitabine at 1000 mg per meter square. The plan is to complete 3 cycles, then restage.    PLAN:  Makaiya will proceed to treatment today as scheduled for day 8 cycle 3 of carboplatin/gemcitabine. She is already scheduled for restaging scans including a PET and CTs of the chest/abdomen/pelvis and neck later this week on August 28. She see [name] next week on September  2 to review those results and discuss her treatment plan. If the results are favorable, we will likely continue with the current regimen.   We spent some time today discussing Manasa's nutrition. I encouraged her to try supplements such as Boost or Carnation instant breakfast. We also discussed the importance of adding calories to what she is eating, even if she is eating small amounts. She'll also try taking some metoclopramide 30 minutes before meals to see if this helps with the early satiety.  I encouraged her to keep herself well hydrated, and we're adding some extra fluids today with treatment, and having her return tomorrow for supportive IV fluids as well.   Franki and Gene both voice understanding and agreement with this plan thus far. As always, they'll call with any changes or problems.     Dazhane Villagomez    02/08/2013

## 2013-02-08 NOTE — Telephone Encounter (Signed)
appts made and printed. Pt is aware that i emailed MW to schedule her for IV on 02/09/13...td

## 2013-02-08 NOTE — Telephone Encounter (Signed)
appts made and printed. i emailed MW to see if tx needed to be adjusted...td

## 2013-02-08 NOTE — Patient Instructions (Signed)
[facility] Discharge Instructions for Patients Receiving Chemotherapy  Today you received the following chemotherapy agents Carboplatin, Gemzar  To help prevent nausea and vomiting after your treatment, we encourage you to take your nausea medication Zofran 8 mg and Compazine 10 mg.   If you develop nausea and vomiting that is not controlled by your nausea medication, call the clinic.   BELOW ARE SYMPTOMS THAT SHOULD BE REPORTED IMMEDIATELY:  *FEVER GREATER THAN 100.5 F  *CHILLS WITH OR WITHOUT FEVER  NAUSEA AND VOMITING THAT IS NOT CONTROLLED WITH YOUR NAUSEA MEDICATION  *UNUSUAL SHORTNESS OF BREATH  *UNUSUAL BRUISING OR BLEEDING  TENDERNESS IN MOUTH AND THROAT WITH OR WITHOUT PRESENCE OF ULCERS  *URINARY PROBLEMS  *BOWEL PROBLEMS  UNUSUAL RASH Items with * indicate a potential emergency and should be followed up as soon as possible.  Feel free to call the clinic you have any questions or concerns. The clinic phone number is [phone].

## 2013-02-09 ENCOUNTER — Ambulatory Visit: Payer: Medicare Other

## 2013-02-09 VITALS — BP 121/67 | HR 82 | Temp 97.2°F | Resp 20

## 2013-02-09 DIAGNOSIS — C801 Malignant (primary) neoplasm, unspecified: Secondary | ICD-10-CM

## 2013-02-09 DIAGNOSIS — C50912 Malignant neoplasm of unspecified site of left female breast: Secondary | ICD-10-CM

## 2013-02-09 MED ORDER — SODIUM CHLORIDE 0.9 % IV SOLN
INTRAVENOUS | Status: DC
  Administered 2013-02-09: 15:00:00 via INTRAVENOUS

## 2013-02-09 NOTE — Patient Instructions (Addendum)
Dehydration, Adult Dehydration is when you lose more fluids from the body than you take in. Vital organs like the kidneys, brain, and heart cannot function without a proper amount of fluids and salt. Any loss of fluids from the body can cause dehydration.  CAUSES   Vomiting.  Diarrhea.  Excessive sweating.  Excessive urine output.  Fever. SYMPTOMS  Mild dehydration  Thirst.  Dry lips.  Slightly dry mouth. Moderate dehydration  Very dry mouth.  Sunken eyes.  Skin does not bounce back quickly when lightly pinched and released.  Dark urine and decreased urine production.  Decreased tear production.  Headache. Severe dehydration  Very dry mouth.  Extreme thirst.  Rapid, weak pulse (more than 100 beats per minute at rest).  Cold hands and feet.  Not able to sweat in spite of heat and temperature.  Rapid breathing.  Blue lips.  Confusion and lethargy.  Difficulty being awakened.  Minimal urine production.  No tears. DIAGNOSIS  Your caregiver will diagnose dehydration based on your symptoms and your exam. Blood and urine tests will help confirm the diagnosis. The diagnostic evaluation should also identify the cause of dehydration. TREATMENT  Treatment of mild or moderate dehydration can often be done at home by increasing the amount of fluids that you drink. It is best to drink small amounts of fluid more often. Drinking too much at one time can make vomiting worse. Refer to the home care instructions below. Severe dehydration needs to be treated at the hospital where you will probably be given intravenous (IV) fluids that contain water and electrolytes. HOME CARE INSTRUCTIONS   Ask your caregiver about specific rehydration instructions.  Drink enough fluids to keep your urine clear or pale yellow.  Drink small amounts frequently if you have nausea and vomiting.  Eat as you normally do.  Avoid:  Foods or drinks high in sugar.  Carbonated  drinks.  Juice.  Extremely hot or cold fluids.  Drinks with caffeine.  Fatty, greasy foods.  Alcohol.  Tobacco.  Overeating.  Gelatin desserts.  Wash your hands well to avoid spreading bacteria and viruses.  Only take over-the-counter or prescription medicines for pain, discomfort, or fever as directed by your caregiver.  Ask your caregiver if you should continue all prescribed and over-the-counter medicines.  Keep all follow-up appointments with your caregiver. SEEK MEDICAL CARE IF:  You have abdominal pain and it increases or stays in one area (localizes).  You have a rash, stiff neck, or severe headache.  You are irritable, sleepy, or difficult to awaken.  You are weak, dizzy, or extremely thirsty. SEEK IMMEDIATE MEDICAL CARE IF:   You are unable to keep fluids down or you get worse despite treatment.  You have frequent episodes of vomiting or diarrhea.  You have blood or green matter (bile) in your vomit.  You have blood in your stool or your stool looks black and tarry.  You have not urinated in 6 to 8 hours, or you have only urinated a small amount of very dark urine.  You have a fever.  You faint. MAKE SURE YOU:   Understand these instructions.  Will watch your condition.  Will get help right away if you are not doing well or get worse. Document Released: 06/03/2005 Document Revised: 08/26/2011 Document Reviewed: 01/21/2011 [employer] Patient Information 2014 [employer]  

## 2013-02-11 ENCOUNTER — Other Ambulatory Visit: Payer: Self-pay | Admitting: Certified Registered Nurse Anesthetist

## 2013-02-11 ENCOUNTER — Encounter (HOSPITAL_COMMUNITY)
Admission: RE | Admit: 2013-02-11 | Discharge: 2013-02-11 | Disposition: A | Discharge status: Home / Self Care | Payer: Medicare Other | Source: Ambulatory Visit | Attending: Physician Assistant | Admitting: Physician Assistant

## 2013-02-11 ENCOUNTER — Ambulatory Visit (HOSPITAL_COMMUNITY)
Admission: RE | Admit: 2013-02-11 | Discharge: 2013-02-11 | Disposition: A | Discharge status: Home / Self Care | Payer: Medicare Other | Source: Ambulatory Visit | Attending: Physician Assistant | Admitting: Physician Assistant

## 2013-02-11 ENCOUNTER — Other Ambulatory Visit: Payer: Self-pay | Admitting: Oncology

## 2013-02-11 DIAGNOSIS — Z79899 Other long term (current) drug therapy: Secondary | ICD-10-CM | POA: Insufficient documentation

## 2013-02-11 DIAGNOSIS — C787 Secondary malignant neoplasm of liver and intrahepatic bile duct: Secondary | ICD-10-CM | POA: Insufficient documentation

## 2013-02-11 DIAGNOSIS — C50919 Malignant neoplasm of unspecified site of unspecified female breast: Secondary | ICD-10-CM | POA: Insufficient documentation

## 2013-02-11 DIAGNOSIS — Z9010 Acquired absence of unspecified breast and nipple: Secondary | ICD-10-CM | POA: Insufficient documentation

## 2013-02-11 DIAGNOSIS — C50912 Malignant neoplasm of unspecified site of left female breast: Secondary | ICD-10-CM

## 2013-02-11 DIAGNOSIS — Z923 Personal history of irradiation: Secondary | ICD-10-CM | POA: Insufficient documentation

## 2013-02-11 DIAGNOSIS — C7970 Secondary malignant neoplasm of unspecified adrenal gland: Secondary | ICD-10-CM | POA: Insufficient documentation

## 2013-02-11 DIAGNOSIS — C772 Secondary and unspecified malignant neoplasm of intra-abdominal lymph nodes: Secondary | ICD-10-CM | POA: Insufficient documentation

## 2013-02-11 DIAGNOSIS — C770 Secondary and unspecified malignant neoplasm of lymph nodes of head, face and neck: Secondary | ICD-10-CM | POA: Insufficient documentation

## 2013-02-11 DIAGNOSIS — E279 Disorder of adrenal gland, unspecified: Secondary | ICD-10-CM | POA: Insufficient documentation

## 2013-02-11 DIAGNOSIS — C774 Secondary and unspecified malignant neoplasm of inguinal and lower limb lymph nodes: Secondary | ICD-10-CM | POA: Insufficient documentation

## 2013-02-11 DIAGNOSIS — D492 Neoplasm of unspecified behavior of bone, soft tissue, and skin: Secondary | ICD-10-CM | POA: Insufficient documentation

## 2013-02-11 DIAGNOSIS — C778 Secondary and unspecified malignant neoplasm of lymph nodes of multiple regions: Secondary | ICD-10-CM | POA: Insufficient documentation

## 2013-02-11 DIAGNOSIS — Z9089 Acquired absence of other organs: Secondary | ICD-10-CM | POA: Insufficient documentation

## 2013-02-11 DIAGNOSIS — R599 Enlarged lymph nodes, unspecified: Secondary | ICD-10-CM | POA: Insufficient documentation

## 2013-02-11 MED ORDER — IOHEXOL 300 MG/ML  SOLN
125.0000 mL | Freq: Once | INTRAMUSCULAR | Status: AC | PRN
  Administered 2013-02-11: 125 mL via INTRAVENOUS

## 2013-02-11 MED ORDER — FLUDEOXYGLUCOSE F - 18 (FDG) INJECTION
19.2000 | Freq: Once | INTRAVENOUS | Status: AC | PRN
  Administered 2013-02-11: 19.2 via INTRAVENOUS

## 2013-02-16 ENCOUNTER — Ambulatory Visit (HOSPITAL_BASED_OUTPATIENT_CLINIC_OR_DEPARTMENT_OTHER): Payer: Medicare Other | Admitting: Oncology

## 2013-02-16 ENCOUNTER — Other Ambulatory Visit: Payer: Self-pay | Admitting: *Deleted

## 2013-02-16 ENCOUNTER — Telehealth: Payer: Self-pay | Admitting: Oncology

## 2013-02-16 ENCOUNTER — Other Ambulatory Visit (HOSPITAL_BASED_OUTPATIENT_CLINIC_OR_DEPARTMENT_OTHER): Payer: Medicare Other | Admitting: Lab

## 2013-02-16 VITALS — BP 107/69 | HR 99 | Temp 98.4°F | Resp 18 | Ht 64.0 in | Wt 126.0 lb

## 2013-02-16 DIAGNOSIS — C50919 Malignant neoplasm of unspecified site of unspecified female breast: Secondary | ICD-10-CM

## 2013-02-16 DIAGNOSIS — C7970 Secondary malignant neoplasm of unspecified adrenal gland: Secondary | ICD-10-CM

## 2013-02-16 DIAGNOSIS — C50219 Malignant neoplasm of upper-inner quadrant of unspecified female breast: Secondary | ICD-10-CM

## 2013-02-16 DIAGNOSIS — C50912 Malignant neoplasm of unspecified site of left female breast: Secondary | ICD-10-CM

## 2013-02-16 DIAGNOSIS — C773 Secondary and unspecified malignant neoplasm of axilla and upper limb lymph nodes: Secondary | ICD-10-CM

## 2013-02-16 DIAGNOSIS — Z170 Estrogen receptor positive status [ER+]: Secondary | ICD-10-CM

## 2013-02-16 LAB — CBC WITH DIFFERENTIAL/PLATELET
BASO%: 0 % (ref 0.0–2.0)
Basophils Absolute: 0 10*3/uL (ref 0.0–0.1)
EOS%: 0.4 % (ref 0.0–7.0)
HCT: 29.4 % — ABNORMAL LOW (ref 34.8–46.6)
HGB: 9.7 g/dL — ABNORMAL LOW (ref 11.6–15.9)
MCH: 29.8 pg (ref 25.1–34.0)
MCHC: 33 g/dL (ref 31.5–36.0)
MCV: 90.5 fL (ref 79.5–101.0)
MONO%: 5.7 % (ref 0.0–14.0)
NEUT%: 72.3 % (ref 38.4–76.8)
RDW: 16.5 % — ABNORMAL HIGH (ref 11.2–14.5)

## 2013-02-16 LAB — COMPREHENSIVE METABOLIC PANEL (CC13)
Alkaline Phosphatase: 84 U/L (ref 40–150)
BUN: 17.3 mg/dL (ref 7.0–26.0)
Glucose: 198 mg/dl — ABNORMAL HIGH (ref 70–140)
Total Bilirubin: 0.65 mg/dL (ref 0.20–1.20)

## 2013-02-16 MED ORDER — LAPATINIB DITOSYLATE 250 MG PO TABS: 1000.0000 mg | ORAL_TABLET | Freq: Every day | ORAL | Status: DC

## 2013-02-16 MED ORDER — LAPATINIB 250 MG TABLET ACORN GSK LPT 112515: 1000.0000 mg | ORAL_TABLET | Freq: Every day | ORAL | Status: DC

## 2013-02-16 NOTE — Telephone Encounter (Signed)
, °

## 2013-02-16 NOTE — Progress Notes (Signed)
Patient ID: Rebecca Pugh, female   DOB: December 09, 1950, 62 y.o.   MRN: [id number] ID: Rebecca Pugh   DOB: [birth year]  MR#: [id number]  CSN#:[id number]  PCP: Rebecca Bur, MD GYN: Rebecca Pugh OTHER MD: Rebecca Pugh  HISTORY OF PRESENT ILLNESS: The patient and her husband, Rebecca Pugh, moved back to this area in 2007 from [place] (Rebecca Pugh actually is a native of [place] and incidentally remains a Herbalist).  As part of moving boxes, [name] felt something under her left arm.  She brought it to Dr. Lovena Pugh attention and although the patient states that she has always had some cyclical swelling of her left axilla during menstruation, Dr. Lalla Pugh set the patient up for evaluation at [facility].  On 12-12-05 the patient had a mammogram and ultrasound there and these showed no mass, distortion or microcalcifications in either breast however, in the left axilla there was a large macrolobulated mass and just above that there was a slightly prominent lymph node which did demonstrate a fatty hilum.  Physically on exam the mass was mobile and nontender and measured approximately 6.5 cm.  The ultrasound showed two small hypoechoic nodules in the left breast 3 cm. from the nipple measuring 7 mm. each and felt to be most likely benign however, the axillary mass, of course, was quite suspicious and biopsy was performed the same day.  This showed ([id number] and [id number]) invasive breast cancer which was strongly ER positive at 76%, moderately PR positive at 25% with a very high proliferation marker at 66%.  HER-2/neu was 1+ and FISH showed no amplification with a ratio of 1.1.    With this information, the patient was referred to Dr. Claud Pugh and on 12-20-05 the patient had bilateral breast MRI's.  In the left breast there was a 5.8 cm. axillary mass and several abnormally enlarged lymph nodes.  There was a 7 mm. left supraclavicular lymph node and the two nodules in the breast that had been noted by ultrasound  were also noted by MRI measuring 1.1 and 0.7 cm.  In the right breast there were no abnormalities noted.  MR guided biopsies of the left breast masses were obtained on 12-27-05 and both showed high grade ductal carcinoma.  There was evidence of lymphovascular invasion and high grade ductal carcinoma in situ as well ([id number]).    Dr. Derrell Pugh discussed the situation with me and we felt it would be useful to have a PET scan prior to definitive surgery.  This was obtained on 12-30-05 and showed the primary axillary mass to have an SUV of 8.5.  The lymph nodes in the left axilla had SUV's in the 2.2 range, small focus of increased activity in the left breast had an SUV of 2.3.  There was also skin thickening with mildly increased diffuse FDG uptake within the skin suggesting an inflammatory breast cancer.  The neck, abdomen and  pelvis were negative.  In the lung windows, there were several scattered small areas of focal nodular density in the upper lobes with compressive atelectasis and this was felt to be benign.  Chest x-ray on 01-09-06 was negative.  With this information, Dr. Derrell Pugh proceeded, after appropriate discussion, to left modified radical mastectomy on 01-15-06 and the insertion of a BARD port at the same time.  The final pathology ([id number]) showed the maximum tumor size to be 5 cm.  Margins were ample.  There was evidence of lymphovascular invasion.  This infiltrating ductal carcinoma was a grade 3  of 3 and 11 out of 14 lymph nodes were involved.  There was evidence of extracapsular extension.  Her subsequent history is as detailed below.   INTERVAL HISTORY: Rebecca Pugh returns today with her husband Rebecca Pugh for followup of her metastatic breast carcinoma. She's currently receiving carboplatin/gemcitabine, both agents given on days one and 8 of each 21 day cycle. Today is day 15 cycle 3   REVIEW OF SYSTEMS: Rebecca Pugh Is tolerating the chemotherapy remarkably well. She has no significant problems with  nausea. She feels fatigued perhaps 1 or 2 days. Her peripheral neuropathy is no worse. She did stumble the other night and fell on the bed coming back from the bathroom. He worse problems she is having is pain from the ureteral stents. She feels like she has a urinary infection all the time. There has been no hematuria. This is what keeps her from exercising on a regular basis. He is making an effort to eat more, and more calorie rich foods. (Meanwhile Rebecca Pugh is trying to lose weight). A detailed review of systems today was otherwise noncontributory   PAST MEDICAL HISTORY: Past Medical History  Diagnosis Date  . Hypertension   . Depression   . Hydronephrosis, bilateral   . Breast cancer metastasized to multiple sites POSITIVE METS IN 2012-----  ONCOLOGIST--  DR Darnelle Catalan--  CURRENTLY RECEIVING CHEMO  EVERY 3 WEEKS (STARTED 06-15-2012)    FIRST DX 01-15-2006, INVASIVE DUCTAL GRADE III/    S/P LEFT MASTECTOMY AND CHEMORADIATION (CHEMO COMPLETE NOV 2007 AND XRT COMPLETE FEB 2008)  remote tonsillectomy, remote cholecystectomy, status post appendectomy, multiple skin biopsies for what the patient said were simple keratoses, and status post port placement.  FAMILY HISTORY: The patient has no information regarding her father. She was brought up by her stepfather. The patient's mother died at the age of 34 with bladder cancer. The patient has one sister who is in good health. There is no history of breast or ovarian cancer in the family to the patient's knowledge.   GYNECOLOGIC HISTORY: She is GX, P2. She was premenopausal at the time of her initial breast cancer diagnosis  SOCIAL HISTORY: She and her husband, Rebecca Pugh, have been married 10 years. 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 She has two children, a daughter, Rebecca Pugh, who lives in [place] and is a Investment banker, corporate, and a son, Rebecca Pugh, also in [place], who works for  [employer]. She has two grandchildren and three step-grandchildren from her own two children, [name] and Rebecca Pugh, who are from her first marriage. Rebecca Pugh has one daughter from his first marriage and he has grandchildren through her. The patient was brought up as a Catholic but currently is not practicing. She and Rebecca Pugh are attending a [employer].      ADVANCED DIRECTIVES:  HEALTH MAINTENANCE: History  Substance Use Topics  . Smoking status: Former Smoker -- 20 years    Quit date: 01/31/1995  . Smokeless tobacco: Never Used  . Alcohol Use: Yes     Comment: occasionl     Colonoscopy:  PAP:  Bone density: August 2012, "Normal"  Lipid panel:  No Known Allergies  Current Outpatient Prescriptions  Medication Sig Dispense Refill  . calcium citrate (CALCITRATE - DOSED IN MG ELEMENTAL CALCIUM) 950 MG tablet Take 1 tablet by mouth daily.       . carvedilol (COREG) 3.125 MG tablet Take 3.125 mg by mouth at bedtime. Decreased  to once daily at night per [name] as of 01/11/2013 d/t hypotension (88/54)      . cholecalciferol (VITAMIN D) 1000 UNITS tablet Take 3,000 Units by mouth daily.       . cholestyramine (QUESTRAN) 4 G packet Take 1 packet by mouth 2 (two) times daily with a meal. Mix with water.      . Desloratadine-Pseudoephedrine (CLARINEX-D 24 HOUR PO) Take 1 capsule by mouth daily.      Marland Kitchen FLUoxetine (PROZAC) 40 MG capsule TAKE ONE CAPSULE BY MOUTH ONCE DAILY  30 capsule  6  . gabapentin (NEURONTIN) 300 MG capsule Take 1 capsule (300 mg total) by mouth at bedtime.  30 capsule  5  . letrozole (FEMARA) 2.5 MG tablet       . lidocaine (XYLOCAINE) 5 % ointment Apply topically 2 (two) times daily as needed.  30 g  1  . lidocaine-prilocaine (EMLA) cream Apply topically as needed.  30 g  3  . loperamide (IMODIUM) 2 MG capsule Take 2 mg by mouth 4 (four) times daily as needed. diarrhea      . LORazepam (ATIVAN) 0.5 MG tablet Take 1 tablet (0.5 mg total) by mouth 2 (two) times daily  as needed.  60 tablet  0  . losartan (COZAAR) 50 MG tablet Take 0.5 tablets (25 mg total) by mouth daily.  30 tablet  6  . metoCLOPramide (REGLAN) 10 MG tablet Take 0.5 tablets (5 mg total) by mouth 4 (four) times daily -  before meals and at bedtime.  120 tablet  12  . omeprazole (PRILOSEC) 40 MG capsule Take 1 capsule (40 mg total) by mouth at bedtime.  30 capsule  12  . ondansetron (ZOFRAN) 8 MG tablet Take 8 mg by mouth 2 (two) times daily as needed for nausea.      Marland Kitchen oxybutynin (DITROPAN) 5 MG tablet       . oxyCODONE (OXY IR/ROXICODONE) 5 MG immediate release tablet Take 1 tablet (5 mg total) by mouth every 6 (six) hours as needed.  30 tablet  0  . potassium chloride (MICRO-K) 10 MEQ CR capsule Take 2 capsules (20 mEq total) by mouth daily.  60 capsule  1  . predniSONE (DELTASONE) 5 MG tablet TAKE ONE TABLET BY MOUTH ONCE DAILY.  TAKE FOR 4 DAYS AFTER DISCONTINUING DEXAMETHASONE  30 tablet  0  . prochlorperazine (COMPAZINE) 10 MG tablet Take 1 tablet (10 mg total) by mouth every 6 (six) hours as needed.  30 tablet  3  . tobramycin-dexamethasone (TOBRADEX) ophthalmic solution Place 1 drop into both eyes 2 (two) times daily.  5 mL  1  . valACYclovir (VALTREX) 1000 MG tablet Take 1 tablet (1,000 mg total) by mouth 2 (two) times daily.  28 tablet  0   No current facility-administered medications for this visit.   Facility-Administered Medications Ordered in Other Visits  Medication Dose Route Frequency Provider Last Rate Last Dose  . 0.9 %  sodium chloride infusion   Intravenous Continuous [name] G Berry, PA-C 500 mL/hr at 02/09/13 1524    . heparin lock flush 100 unit/mL  500 Units Intravenous Once Lowella Dell, MD      . sodium chloride 0.9 % injection 10 mL  10 mL Intravenous PRN Lowella Dell, MD         OBJECTIVE:  Filed Vitals:   02/16/13 1449  BP: 107/69  Pulse: 99  Temp: 98.4 F (36.9 C)  Resp: 18  Body mass index  is 21.62 kg/(m^2). Filed Weights   02/16/13 1449   Weight: 57.153 kg (126 lb)   Middle-aged white woman in no acute distress  ECOG: 2  Sclerae unicteric Oropharynx clearAnd I do not palpate any well-defined cervical or supraclavicular adenopathy at present Lungs clear to auscultation, no rales or rhonchi Heart regular rate and rhythm, no murmur appreciated Abdomen nontender, soft to palpation, positive bowel sounds MSK  no peripheral edema, continued right foot drop with right ankle brace Neuro: nonfocal, well oriented, Pleasant affect Breasts: Deferred. No axillary adenopathy palpated.    LAB RESULTS:  Lab Results  Component Value Date   WBC 2.6* 02/16/2013   NEUTROABS 1.9 02/16/2013   HGB 9.7* 02/16/2013   HCT 29.4* 02/16/2013   MCV 90.5 02/16/2013   PLT 76* 02/16/2013      Chemistry      Component Value Date/Time   NA 140 02/08/2013 1051   NA 140 01/15/2013 0748   NA 142 11/05/2010 1036   K 4.4 02/08/2013 1051   K 3.2* 01/15/2013 0748   K 4.5 11/05/2010 1036   CL 102 01/15/2013 0748   CL 99 11/30/2012 0835   CL 99 11/05/2010 1036   CO2 24 02/08/2013 1051   CO2 25 02/01/2012 0500   CO2 26 11/05/2010 1036   BUN 19.1 02/08/2013 1051   BUN 18 01/15/2013 0748   BUN 14 11/05/2010 1036   CREATININE 1.2* 02/08/2013 1051   CREATININE 1.20* 01/15/2013 0748   CREATININE 0.8 11/05/2010 1036      Component Value Date/Time   CALCIUM 9.8 02/08/2013 1051   CALCIUM 8.6 02/01/2012 0500   CALCIUM 8.8 11/05/2010 1036   ALKPHOS 90 02/08/2013 1051   ALKPHOS 88 01/08/2012 0916   ALKPHOS 106* 11/05/2010 1036   AST 29 02/08/2013 1051   AST 24 01/08/2012 0916   AST 27 11/05/2010 1036   ALT 31 02/08/2013 1051   ALT 27 01/08/2012 0916   ALT 35 11/05/2010 1036   BILITOT 0.84 02/08/2013 1051   BILITOT 0.9 01/08/2012 0916   BILITOT 0.80 11/05/2010 1036       Lab Results  Component Value Date   LABCA2 107* 08/17/2012    STUDIES: Ct Soft Tissue Neck W Contrast  02/11/2013   *RADIOLOGY REPORT*  Clinical Data: Metastatic breast cancer follow-up  CT NECK WITH CONTRAST   Technique:  Multidetector CT imaging of the neck was performed with intravenous contrast.  Contrast: OMNIPAQUE IOHEXOL 300 MG/ML  SOLN  Comparison: CT neck 11/17/2012  Findings: Pathologic left supraclavicular lymph nodes have improved.  These have irregular margins but are smaller compared to the prior study.  Index node on the prior study measured 14 x 15 mm now measures 7 x 12 mm.  Additional lymph nodes in this area also are smaller.  There is enhancing soft tissue tumor infiltrating around the left C8 and T1 nerve roots.  This also has improved in the interval.  Left level II node anterior to the left jugular vein measures 6.5 mm and is unchanged.  No new adenopathy is present.  Para pharyngeal soft tissues are normal.  Major salivary glands are normal.  Thyroid is normal.  See separate CT chest report for chest findings.  IMPRESSION: Left supraclavicular adenopathy has improved.  Tumor around the left C7-T1 foramen   also has  improved in the interval.  No new adenopathy.   Original Report Authenticated By: Janeece Riggers, M.D.   Ct Chest W Contrast  02/11/2013   *RADIOLOGY  REPORT*  Clinical Data:  Metastatic breast cancer, status post left mastectomy, chemotherapy ongoing, XRT complete.  CT CHEST, ABDOMEN AND PELVIS WITH CONTRAST  Technique:  Multidetector CT imaging of the chest, abdomen and pelvis was performed following the standard protocol during bolus administration of intravenous contrast.  Contrast: OMNIPAQUE IOHEXOL 300 MG/ML  SOLN  Comparison:  11/17/2012  CT CHEST  Findings:  Status post left mastectomy with left axillary lymph node dissection.  Scarring/radiation changes in the anterior left upper lobe.  No suspicious pulmonary nodules. No pleural effusion or pneumothorax.  Visualized thyroid is unremarkable.  Heart is normal in size.  No pericardial effusion.  Right chest port.  No suspicious mediastinal, hilar, or axillary lymphadenopathy.  Small left supraclavicular nodes measuring  up to 6 mm short axis (series 2/image 3), improved.  Visualized osseous structures are within normal limits.  IMPRESSION: Status post left mastectomy with left axillary lymph node dissection.  Scarring/radiation changes in the anterior left upper lobe.  Small left supraclavicular nodes measuring up to 6 mm short axis, improved.  No evidence of new/progressive metastatic disease in the chest.  CT ABDOMEN AND PELVIS  Findings:  Widespread hepatic metastases.  While many lesions are improving, there are also several new lesions on the current study. Representative lesions include: --2.2 x 2.4 cm lesion in the anterior right hepatic dome (series 2/image 42), new --3.1 x 2.3 cm lesion in the lateral segment left hepatic lobe, previously 2.2 x 2 x 1 cm --2.3 x 2.6 cm lesion in the medial segment left hepatic lobe (series 2/image 47), new --2.3 x 2.5 cm lesion in the medial segment left hepatic lobe (series 2/image 48), new --10 mm lesion in the posterior segment right hepatic lobe (series 2/image 51), previously 2.1 x 2.1 cm  Spleen, pancreas, and right adrenal gland are within normal limits.  5.6 x 4.8 cm heterogeneous left adrenal mass, previously 5.8 x 4.6 cm.  Status post cholecystectomy.  No intrahepatic or extrahepatic ductal dilatation.  Kidneys are notable for mild fullness of the bilateral renal collecting systems with indwelling bilateral pigtail ureteral stents.  No evidence of bowel obstruction.  Trace ascites in the right pelvis (series 2/image 01/07).  Improving nodal metastases, including: --3.6 x 5.7 cm right para-aortic nodal mass (series 3/image 18), previously 4.6 x 6.9 cm --4.0 x 3.6 cm left common iliac nodal mass (series 3/image 28), previously 5.5 x 4.6 cm --6 mm short-axis right inguinal node (series 2/image 114), previously 10 mm  Uterus and bilateral ovaries are grossly unremarkable.  Bladder is underdistended.  Mild degenerative changes at L5-S1.  IMPRESSION: Progression of hepatic metastases,  with index lesions as described above.  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  Retroperitoneal/right inguinal nodal metastases, decreased.   Original Report Authenticated By: Charline Bills, M.D.   Ct Abdomen Pelvis W Contrast  02/11/2013   *RADIOLOGY REPORT*  Clinical Data:  Metastatic breast cancer, status post left mastectomy, chemotherapy ongoing, XRT complete.  CT CHEST, ABDOMEN AND PELVIS WITH CONTRAST  Technique:  Multidetector CT imaging of the chest, abdomen and pelvis was performed following the standard protocol during bolus administration of intravenous contrast.  Contrast: OMNIPAQUE IOHEXOL 300 MG/ML  SOLN  Comparison:  11/17/2012  CT CHEST  Findings:  Status post left mastectomy with left axillary lymph node dissection.  Scarring/radiation changes in the anterior left upper lobe.  No suspicious pulmonary nodules. No pleural effusion or pneumothorax.  Visualized thyroid is unremarkable.  Heart is normal in size.  No pericardial effusion.  Right chest port.  No suspicious mediastinal, hilar, or axillary lymphadenopathy.  Small left supraclavicular nodes measuring up to 6 mm short axis (series 2/image 3), improved.  Visualized osseous structures are within normal limits.  IMPRESSION: Status post left mastectomy with left axillary lymph node dissection.  Scarring/radiation changes in the anterior left upper lobe.  Small left supraclavicular nodes measuring up to 6 mm short axis, improved.  No evidence of new/progressive metastatic disease in the chest.  CT ABDOMEN AND PELVIS  Findings:  Widespread hepatic metastases.  While many lesions are improving, there are also several new lesions on the current study. Representative lesions include: --2.2 x 2.4 cm lesion in the anterior right hepatic dome (series 2/image 42), new --3.1 x 2.3 cm lesion in the lateral segment left hepatic lobe, previously 2.2 x 2 x 1 cm --2.3 x 2.6 cm lesion in the medial segment left hepatic lobe (series 2/image 47),  new --2.3 x 2.5 cm lesion in the medial segment left hepatic lobe (series 2/image 48), new --10 mm lesion in the posterior segment right hepatic lobe (series 2/image 51), previously 2.1 x 2.1 cm  Spleen, pancreas, and right adrenal gland are within normal limits.  5.6 x 4.8 cm heterogeneous left adrenal mass, previously 5.8 x 4.6 cm.  Status post cholecystectomy.  No intrahepatic or extrahepatic ductal dilatation.  Kidneys are notable for mild fullness of the bilateral renal collecting systems with indwelling bilateral pigtail ureteral stents.  No evidence of bowel obstruction.  Trace ascites in the right pelvis (series 2/image 01/07).  Improving nodal metastases, including: --3.6 x 5.7 cm right para-aortic nodal mass (series 3/image 18), previously 4.6 x 6.9 cm --4.0 x 3.6 cm left common iliac nodal mass (series 3/image 28), previously 5.5 x 4.6 cm --6 mm short-axis right inguinal node (series 2/image 114), previously 10 mm  Uterus and bilateral ovaries are grossly unremarkable.  Bladder is underdistended.  Mild degenerative changes at L5-S1.  IMPRESSION: Progression of hepatic metastases, with index lesions as described above.  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  Retroperitoneal/right inguinal nodal metastases, decreased.   Original Report Authenticated By: Charline Bills, M.D.   Nm Pet Image Restag (ps) Skull Base To Thigh  02/11/2013   *RADIOLOGY REPORT*  Clinical Data: Subsequent treatment strategy for metastatic breast cancer.  NUCLEAR MEDICINE PET SKULL BASE TO THIGH  Fasting Blood Glucose:  87  Technique:  19.2 mCi F-18 FDG was injected intravenously. CT data was obtained and used for attenuation correction and anatomic localization only.  (This was not acquired as a diagnostic CT examination.) Additional exam technical data entered on technologist worksheet.  Comparison:  11/17/2012  Findings:  Neck: No hypermetabolic lymph nodes in the neck.  Small left supraclavicular nodes measuring up to 9  mm short axis (series 2/image 45), improved, max SUV 2.4 (previously max SUV 4.8).  Chest:  Status post left mastectomy with left axillary lymph node dissection.  Mild hypermetabolism in the medial left chest wall, max SUV 4.5 (PET image 63), without definite CT correlate.  No hypermetabolic mediastinal or hilar nodes.  Scarring/radiation changes in the anterior left upper lobe.  No suspicious pulmonary nodules on the CT scan.  Right chest port.  Abdomen/Pelvis:  Widespread hepatic metastases, many of which are new.  Representative lesions include: --2.6 x 2.5 cm lesion in the anterior right hepatic dome (series 2/image 109) with max SUV 9.5, new --2.7 x 1.9 cm lesion in the lateral segment left hepatic lobe (series  2/image 111), max SUV 7.8, unchanged in size with prior max SUV 11.2 3.0 x 1.9 cm lesion in the medial segment left hepatic lobe (series 2/image 123) with max SUV 9.5, new  No abnormal hypermetabolic activity within the pancreas, right adrenal gland, or spleen.  5.9 x 5.1 cm left adrenal mass (series 2/image 131) with max SUV 11.1, previously 6.8 x 5.3 cm with max SUV 12.6.  Retroperitoneal nodal metastases, including:  --3.2 x 5.3 cm right para-aortic node (series 2/image 144) with max SUV 9.6, previously 3.7 x 6.9 cm with max SUV 12.1 --3.3 x 4.5 cm left para-aortic nodal mass (series 2/image 158) with max SUV 11.1, previously 6.6 x 3.8 cm with max SUV 11.9  7 mm short axis right inguinal node (series 2/image 220), decreased, now non-FDG-avid with max SUV 1.4 (previously max SUV 7.5).  Bilateral double pigtail ureteral stents in satisfactory position. No hydronephrosis.  Skeleton:  No focal hypermetabolic activity to suggest skeletal metastasis.  IMPRESSION: Mixed response to therapy.  Progression of hepatic metastases, with index lesions as described above, max SUV 9.5.  Nodal metastases in the left supraclavicular region, retroperitoneum, and right inguinal region are improved.  Mild hypermetabolism  in the medial left chest wall, max SUV 4.5, without definite CT correlate.   Original Report Authenticated By: Charline Bills, M.D.   ASSESSMENT: 62 y.o.  [facility] woman with stage IV breast cancer.  1. 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 2. Status post 4 cycles of adjuvant docetaxel, carboplatin and trastuzumab completed November 2007. 3. Postmastectomy radiation completed February 2008. 4. Anastrozole begun February 2008 (the trastuzumab also was continued to complete a year), continued until June 2012.  5. 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 with left retrocrural and retroperitoneal adenopathy.  6. On tamoxifen/lapatinib/trastuzumab between June 2012 and October 2012.  7. Ixempra/lapatinib/trastuzumab started October 2012, the Ixempra discontinued in February 2013 due to peripheral neuropathy. 8. Continuing on lapatinib/trastuzumab with the addition of fulvestrant, first injections given on 07/31/2011. Lapatinib given at a dose of 1 g (4 tablets) daily, and trastuzumab given at 8 mg/kg every 4 weeks to coordinate with injection appointments. 9. Discontinued lapatinib and fulvestrant in December 2013 10. Started q. three-week docetaxel/ trastuzumab/ pertuzumab, first dose given 06/15/2012.  Docetaxel given at a 50% dose reduction beginning with cycle 2 due to poor tolerance and severe neutropenia. Completed 6 cycles as of 09/28/2012.  11. Trastuzumab/ pertuzumab continued  to June of 2014, with evidence of disease progression. Letrozole added 10/19/2012. 12. Bilateral hydronephrosis noted March 2014, bilateral ureteral stenting performed 09/11/2012 under Dr Brunilda Payor 13. Herpes Zoster June 2014, resolved 14. 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  An echocardiogram on 12/07/2012 showed a reduced ejection fraction of 40%, likely secondary to previous treatment with trastuzumab. Subsequently, TDM-1 was discontinued. 15. Started carboplatin/gemcitabine, both agents to be given on days one and 8 of each 21 day cycle, first dose on 12/21/2012.  Carboplatin will be given at an AUC of 2, the gemcitabine at 1000 mg per meter square.    PLAN:  Mckinna's breast cancer is largely responding to her current treatment. Furthermore she is tolerating the treatment well. However there are new aggressive spots in the liver. 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  She had a drop in her ejection fraction with anti-HER-2 treatment previously, although this had resolved by the time we rechecked her last echo in July. We're going to start lapatinib at 4 tablets (1 g )was dailywhile continuing the carboplatin and gemcitabine. We are going to do this for 2 cycles, which is 6 more weeks, and then we will repeat an MRI of her liver. I am hopeful we will see a measurable response particularly in the new liver spots.  Otherwise Yaiza is somewhat constipated. I think the lapatinib will likely take care of that problem. She is having pain related to her stents. I will see a doctor niece he feels any kind of revision (it has only been a month since last placed) might make her quality of life better.   Sarahgrace noted call for any problems that may develop before the next visit here.    Nancie Bocanegra C    02/16/2013

## 2013-02-17 ENCOUNTER — Telehealth: Payer: Self-pay | Admitting: *Deleted

## 2013-02-17 ENCOUNTER — Telehealth: Payer: Self-pay | Admitting: Emergency Medicine

## 2013-02-17 NOTE — Telephone Encounter (Signed)
Per staff message and POF I have scheduled appts.  JMW  

## 2013-02-17 NOTE — Telephone Encounter (Signed)
Patient called to inform nurse that she has 72 [employer]kerb pills at this time.

## 2013-02-19 ENCOUNTER — Telehealth: Payer: Self-pay | Admitting: Oncology

## 2013-02-22 ENCOUNTER — Encounter: Payer: Self-pay | Admitting: Physician Assistant

## 2013-02-22 ENCOUNTER — Ambulatory Visit (HOSPITAL_BASED_OUTPATIENT_CLINIC_OR_DEPARTMENT_OTHER): Payer: Medicare Other | Admitting: Physician Assistant

## 2013-02-22 ENCOUNTER — Ambulatory Visit (HOSPITAL_BASED_OUTPATIENT_CLINIC_OR_DEPARTMENT_OTHER): Payer: Medicare Other

## 2013-02-22 ENCOUNTER — Other Ambulatory Visit (HOSPITAL_BASED_OUTPATIENT_CLINIC_OR_DEPARTMENT_OTHER): Payer: Medicare Other | Admitting: Lab

## 2013-02-22 VITALS — BP 125/75 | HR 87 | Temp 98.2°F | Resp 20 | Ht 64.0 in | Wt 125.6 lb

## 2013-02-22 DIAGNOSIS — C801 Malignant (primary) neoplasm, unspecified: Secondary | ICD-10-CM

## 2013-02-22 DIAGNOSIS — C7970 Secondary malignant neoplasm of unspecified adrenal gland: Secondary | ICD-10-CM

## 2013-02-22 DIAGNOSIS — C50912 Malignant neoplasm of unspecified site of left female breast: Secondary | ICD-10-CM

## 2013-02-22 DIAGNOSIS — M21371 Foot drop, right foot: Secondary | ICD-10-CM

## 2013-02-22 DIAGNOSIS — C50219 Malignant neoplasm of upper-inner quadrant of unspecified female breast: Secondary | ICD-10-CM

## 2013-02-22 DIAGNOSIS — C773 Secondary and unspecified malignant neoplasm of axilla and upper limb lymph nodes: Secondary | ICD-10-CM

## 2013-02-22 DIAGNOSIS — D630 Anemia in neoplastic disease: Secondary | ICD-10-CM

## 2013-02-22 DIAGNOSIS — R350 Frequency of micturition: Secondary | ICD-10-CM

## 2013-02-22 DIAGNOSIS — F419 Anxiety disorder, unspecified: Secondary | ICD-10-CM

## 2013-02-22 DIAGNOSIS — Z5111 Encounter for antineoplastic chemotherapy: Secondary | ICD-10-CM

## 2013-02-22 DIAGNOSIS — R300 Dysuria: Secondary | ICD-10-CM

## 2013-02-22 DIAGNOSIS — N1330 Unspecified hydronephrosis: Secondary | ICD-10-CM

## 2013-02-22 LAB — CBC WITH DIFFERENTIAL/PLATELET
Basophils Absolute: 0 10*3/uL (ref 0.0–0.1)
Eosinophils Absolute: 0.1 10*3/uL (ref 0.0–0.5)
HCT: 30.7 % — ABNORMAL LOW (ref 34.8–46.6)
HGB: 10 g/dL — ABNORMAL LOW (ref 11.6–15.9)
LYMPH%: 28.2 % (ref 14.0–49.7)
MCV: 92.5 fL (ref 79.5–101.0)
MONO%: 15.8 % — ABNORMAL HIGH (ref 0.0–14.0)
NEUT#: 2 10*3/uL (ref 1.5–6.5)
NEUT%: 53.4 % (ref 38.4–76.8)
Platelets: 272 10*3/uL (ref 145–400)
RDW: 18.3 % — ABNORMAL HIGH (ref 11.2–14.5)

## 2013-02-22 LAB — COMPREHENSIVE METABOLIC PANEL (CC13)
CO2: 22 mEq/L (ref 22–29)
Glucose: 104 mg/dl (ref 70–140)
Sodium: 139 mEq/L (ref 136–145)
Total Bilirubin: 1.22 mg/dL — ABNORMAL HIGH (ref 0.20–1.20)
Total Protein: 7.1 g/dL (ref 6.4–8.3)

## 2013-02-22 LAB — URINALYSIS, MICROSCOPIC - CHCC

## 2013-02-22 MED ORDER — DEXAMETHASONE SODIUM PHOSPHATE 10 MG/ML IJ SOLN
10.0000 mg | Freq: Once | INTRAMUSCULAR | Status: AC
  Administered 2013-02-22: 10 mg via INTRAVENOUS

## 2013-02-22 MED ORDER — SODIUM CHLORIDE 0.9 % IV SOLN
1000.0000 mg/m2 | Freq: Once | INTRAVENOUS | Status: AC
  Administered 2013-02-22: 1634 mg via INTRAVENOUS
  Filled 2013-02-22: qty 42.98

## 2013-02-22 MED ORDER — ONDANSETRON 8 MG/50ML IVPB (CHCC)
8.0000 mg | Freq: Once | INTRAVENOUS | Status: AC
  Administered 2013-02-22: 8 mg via INTRAVENOUS

## 2013-02-22 MED ORDER — SODIUM CHLORIDE 0.9 % IJ SOLN
10.0000 mL | INTRAMUSCULAR | Status: DC | PRN
  Administered 2013-02-22: 10 mL
  Filled 2013-02-22: qty 10

## 2013-02-22 MED ORDER — HEPARIN SOD (PORK) LOCK FLUSH 100 UNIT/ML IV SOLN
500.0000 [IU] | Freq: Once | INTRAVENOUS | Status: AC | PRN
  Administered 2013-02-22: 500 [IU]
  Filled 2013-02-22: qty 5

## 2013-02-22 MED ORDER — SODIUM CHLORIDE 0.9 % IV SOLN
Freq: Once | INTRAVENOUS | Status: AC
  Administered 2013-02-22: 09:00:00 via INTRAVENOUS

## 2013-02-22 MED ORDER — SODIUM CHLORIDE 0.9 % IV SOLN
130.6000 mg | Freq: Once | INTRAVENOUS | Status: AC
  Administered 2013-02-22: 130 mg via INTRAVENOUS
  Filled 2013-02-22: qty 13

## 2013-02-22 NOTE — Progress Notes (Signed)
Zollie Scale, PA aware of UA results. Per [name], this RN informed the patient that there is blood in her urine and she will hear from the medical team tomorrow after [name], [name], and urologist review her test results. Patient verbalizes understanding and is OK with this plan. Clayborn Heron, RN

## 2013-02-22 NOTE — Patient Instructions (Addendum)
[facility] Discharge Instructions for Patients Receiving Chemotherapy  Today you received the following chemotherapy agents: Gemzar, Carboplatin   To help prevent nausea and vomiting after your treatment, we encourage you to take your nausea medication as directed by your physician.   If you develop nausea and vomiting that is not controlled by your nausea medication, call the clinic.   BELOW ARE SYMPTOMS THAT SHOULD BE REPORTED IMMEDIATELY:  *FEVER GREATER THAN 100.5 F  *CHILLS WITH OR WITHOUT FEVER  NAUSEA AND VOMITING THAT IS NOT CONTROLLED WITH YOUR NAUSEA MEDICATION  *UNUSUAL SHORTNESS OF BREATH  *UNUSUAL BRUISING OR BLEEDING  TENDERNESS IN MOUTH AND THROAT WITH OR WITHOUT PRESENCE OF ULCERS  *URINARY PROBLEMS  *BOWEL PROBLEMS  UNUSUAL RASH Items with * indicate a potential emergency and should be followed up as soon as possible.  Feel free to call the clinic you have any questions or concerns. The clinic phone number is [phone].

## 2013-02-22 NOTE — Progress Notes (Signed)
Patient ID: Rebecca Pugh, female   DOB: September 28, 1950, 62 y.o.   MRN: [id number] ID: Rebecca Pugh   DOB: 1951/05/08  MR#: [id number]  MWN#:[id number]  PCP: [name], MD GYN: [name] OTHER MD: [name]  HISTORY OF PRESENT ILLNESS: The patient and her husband, Gene, moved back to this area in 2007 from [place] (Gene actually is a native of [place] and incidentally remains a Herbalist).  As part of moving boxes, [name] felt something under her left arm.  She brought it to [name] attention and although the patient states that she has always had some cyclical swelling of her left axilla during menstruation, [name] set the patient up for evaluation at [facility].  On 12-12-05 the patient had a mammogram and ultrasound there and these showed no mass, distortion or microcalcifications in either breast however, in the left axilla there was a large macrolobulated mass and just above that there was a slightly prominent lymph node which did demonstrate a fatty hilum.  Physically on exam the mass was mobile and nontender and measured approximately 6.5 cm.  The ultrasound showed two small hypoechoic nodules in the left breast 3 cm. from the nipple measuring 7 mm. each and felt to be most likely benign however, the axillary mass, of course, was quite suspicious and biopsy was performed the same day.  This showed ([id number] and [id number]) invasive breast cancer which was strongly ER positive at 76%, moderately PR positive at 25% with a very high proliferation marker at 66%.  HER-2/neu was 1+ and FISH showed no amplification with a ratio of 1.1.    With this information, the patient was referred to Dr. [name] and on 12-20-05 the patient had bilateral breast MRI's.  In the left breast there was a 5.8 cm. axillary mass and several abnormally enlarged lymph nodes.  There was a 7 mm. left supraclavicular lymph node and the two nodules in the breast that had been noted by ultrasound  were also noted by MRI measuring 1.1 and 0.7 cm.  In the right breast there were no abnormalities noted.  MR guided biopsies of the left breast masses were obtained on 12-27-05 and both showed high grade ductal carcinoma.  There was evidence of lymphovascular invasion and high grade ductal carcinoma in situ as well ([id number]).    [name] discussed the situation with me and we felt it would be useful to have a PET scan prior to definitive surgery.  This was obtained on 12-30-05 and showed the primary axillary mass to have an SUV of 8.5.  The lymph nodes in the left axilla had SUV's in the 2.2 range, small focus of increased activity in the left breast had an SUV of 2.3.  There was also skin thickening with mildly increased diffuse FDG uptake within the skin suggesting an inflammatory breast cancer.  The neck, abdomen and  pelvis were negative.  In the lung windows, there were several scattered small areas of focal nodular density in the upper lobes with compressive atelectasis and this was felt to be benign.  Chest x-ray on 01-09-06 was negative.  With this information, [name] proceeded, after appropriate discussion, to left modified radical mastectomy on 01-15-06 and the insertion of a BARD port at the same time.  The final pathology ([id number]) showed the maximum tumor size to be 5 cm.  Margins were ample.  There was evidence of lymphovascular invasion.  This infiltrating ductal carcinoma was a grade 3  of 3 and 11 out of 14 lymph nodes were involved.  There was evidence of extracapsular extension.  Her subsequent history is as detailed below.   INTERVAL HISTORY: Rebecca Pugh returns alone today  for followup of her metastatic breast carcinoma. She's currently receiving carboplatin/gemcitabine, both agents given on days one and 8 of each 21 day cycle. Today is day 1 cycle 4.   Following her appointment with [name] last week, she has also restarted lapatinib, at 1000 mg daily, the first dose on  02/17/2013. Thus far she is tolerating the lapatinib well. She has had only a couple of loose stools daily, definitely manageable, and if anything, she feels like this is an improvement over her previous constipation. She's had no blood or mucus in the stool and denies abdominal pain. She's had no oral ulcerations or oral sensitivity. She denies any skin changes, cracking, or peeling.   REVIEW OF SYSTEMS: [name]'s biggest complaint continues to be discomfort associated with her stents. She feels like she has urinary spasms, and has had increased dysuria and urinary frequency. She denies any signs of hematuria. She also denies any fevers or chills. Her appetite is fair. She's had no nausea or emesis. She denies any increased cough, phlegm production, or chest pain. She has some mild shortness of breath with exertion which is stable. She's had no abnormal headaches or dizziness. She continues to have problems with right footdrop, but denies any recent falls. She is very careful when she walks, and usually wears a right ankle brace. Currently, she denies any unusual myalgias, arthralgias, or bony pain. She's had no increased neuropathy, and no increased peripheral swelling.  A detailed review of systems is otherwise stable and noncontributory.   PAST MEDICAL HISTORY: Past Medical History  Diagnosis Date  . Hypertension   . Depression   . Hydronephrosis, bilateral   . Breast cancer metastasized to multiple sites POSITIVE METS IN 2012-----  ONCOLOGIST--  DR Darnelle Catalan--  CURRENTLY RECEIVING CHEMO  EVERY 3 WEEKS (STARTED 06-15-2012)    FIRST DX 01-15-2006, INVASIVE DUCTAL GRADE III/    S/P LEFT MASTECTOMY AND CHEMORADIATION (CHEMO COMPLETE NOV 2007 AND XRT COMPLETE FEB 2008)  remote tonsillectomy, remote cholecystectomy, status post appendectomy, multiple skin biopsies for what the patient said were simple keratoses, and status post port placement.  FAMILY HISTORY: The patient has no information regarding  her father. She was brought up by her stepfather. The patient's mother died at the age of 55 with bladder cancer. The patient has one sister who is in good health. There is no history of breast or ovarian cancer in the family to the patient's knowledge.   GYNECOLOGIC HISTORY: She is GX, P2. She was premenopausal at the time of her initial breast cancer diagnosis  SOCIAL HISTORY: She and her husband, Gene, have been married 10 years. 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 She has two children, a daughter, [name], who lives in [place] and is a Investment banker, corporate, and a son, [name], also in [place], who works for [employer]. She has two grandchildren and three step-grandchildren from her own two children, [name] and [name], who are from her first marriage. Gene has one daughter from his first marriage and he has grandchildren through her. The patient was brought up as a Catholic but currently is not practicing. She and Gene are attending a [employer].      ADVANCED DIRECTIVES:  HEALTH MAINTENANCE: History  Substance Use Topics  . Smoking status: Former Smoker -- 20 years    Quit date: 01/31/1995  . Smokeless tobacco: Never Used  . Alcohol Use: Yes     Comment: occasionl     Colonoscopy:  PAP:  Bone density: August 2012, "Normal"  Lipid panel:  No Known Allergies  Current Outpatient Prescriptions  Medication Sig Dispense Refill  . calcium citrate (CALCITRATE - DOSED IN MG ELEMENTAL CALCIUM) 950 MG tablet Take 1 tablet by mouth daily.       . carvedilol (COREG) 3.125 MG tablet Take 3.125 mg by mouth at bedtime. Decreased to once daily at night per [name] as of 01/11/2013 d/t hypotension (88/54)      . cholecalciferol (VITAMIN D) 1000 UNITS tablet Take 3,000 Units by mouth daily.       . cholestyramine (QUESTRAN) 4 G packet Take 1 packet by mouth 2 (two) times daily with a meal. Mix with  water.      . Desloratadine-Pseudoephedrine (CLARINEX-D 24 HOUR PO) Take 1 capsule by mouth daily.      Marland Kitchen FLUoxetine (PROZAC) 40 MG capsule TAKE ONE CAPSULE BY MOUTH ONCE DAILY  30 capsule  6  . gabapentin (NEURONTIN) 300 MG capsule Take 1 capsule (300 mg total) by mouth at bedtime.  30 capsule  5  . lapatinib (TYKERB) 250 MG tablet Take 4 tablets (1,000 mg total) by mouth daily.  120 tablet  3  . letrozole (FEMARA) 2.5 MG tablet       . lidocaine (XYLOCAINE) 5 % ointment Apply topically 2 (two) times daily as needed.  30 g  1  . lidocaine-prilocaine (EMLA) cream Apply topically as needed.  30 g  3  . loperamide (IMODIUM) 2 MG capsule Take 2 mg by mouth 4 (four) times daily as needed. diarrhea      . LORazepam (ATIVAN) 0.5 MG tablet Take 1 tablet (0.5 mg total) by mouth 2 (two) times daily as needed.  60 tablet  0  . losartan (COZAAR) 50 MG tablet Take 0.5 tablets (25 mg total) by mouth daily.  30 tablet  6  . metoCLOPramide (REGLAN) 10 MG tablet Take 0.5 tablets (5 mg total) by mouth 4 (four) times daily -  before meals and at bedtime.  120 tablet  12  . omeprazole (PRILOSEC) 40 MG capsule Take 1 capsule (40 mg total) by mouth at bedtime.  30 capsule  12  . ondansetron (ZOFRAN) 8 MG tablet Take 8 mg by mouth 2 (two) times daily as needed for nausea.      Marland Kitchen oxybutynin (DITROPAN) 5 MG tablet       . oxyCODONE (OXY IR/ROXICODONE) 5 MG immediate release tablet Take 1 tablet (5 mg total) by mouth every 6 (six) hours as needed.  30 tablet  0  . potassium chloride (MICRO-K) 10 MEQ CR capsule Take 2 capsules (20 mEq total) by mouth daily.  60 capsule  1  . predniSONE (DELTASONE) 5 MG tablet TAKE ONE TABLET BY MOUTH ONCE DAILY.  TAKE FOR 4 DAYS AFTER DISCONTINUING DEXAMETHASONE  30 tablet  0  . prochlorperazine (COMPAZINE) 10 MG tablet Take 1 tablet (10 mg total) by mouth every 6 (six) hours as needed.  30 tablet  3  . tobramycin-dexamethasone (TOBRADEX) ophthalmic solution Place 1 drop into both eyes 2  (two) times daily.  5 mL  1  . valACYclovir (VALTREX) 1000 MG tablet Take 1 tablet (1,000 mg total) by mouth 2 (two)  times daily.  28 tablet  0   No current facility-administered medications for this visit.   Facility-Administered Medications Ordered in Other Visits  Medication Dose Route Frequency Provider Last Rate Last Dose  . 0.9 %  sodium chloride infusion   Intravenous Continuous Zamire Whitehurst G Bernedette Auston, PA-C 500 mL/hr at 02/09/13 1524    . heparin lock flush 100 unit/mL  500 Units Intravenous Once Lowella Dell, MD      . sodium chloride 0.9 % injection 10 mL  10 mL Intravenous PRN Lowella Dell, MD         OBJECTIVE:  Filed Vitals:   02/22/13 0906  BP: 125/75  Pulse: 87  Temp: 98.2 F (36.8 C)  Resp: 20  Body mass index is 21.55 kg/(m^2). Filed Weights   02/22/13 0906  Weight: 125 lb 9.6 oz (56.972 kg)   Middle-aged white woman in no acute distress  ECOG: 2  Sclerae unicteric Oropharynx clear, with no ulcerations. There is a small palpable lymph node in the left I do not palpa cervical chain, but no additional cervical or supraclavicular adenopathy on either the right or the left.  Lungs clear to auscultation, no rales or rhonchi Heart regular rate and rhythm, no murmur appreciated Abdomen nontender, soft to palpation, positive bowel sounds, no hepatomegaly palpated  MSK  no peripheral edema, continued right foot drop with right ankle brace Neuro: nonfocal, well oriented, Pleasant affect Breasts: Deferred. No axillary adenopathy palpated.   LAB RESULTS:  Lab Results  Component Value Date   WBC 3.8* 02/22/2013   NEUTROABS 2.0 02/22/2013   HGB 10.0* 02/22/2013   HCT 30.7* 02/22/2013   MCV 92.5 02/22/2013   PLT 272 02/22/2013      Chemistry      Component Value Date/Time   NA 140 02/16/2013 1429   NA 140 01/15/2013 0748   NA 142 11/05/2010 1036   K 4.6 02/16/2013 1429   K 3.2* 01/15/2013 0748   K 4.5 11/05/2010 1036   CL 102 01/15/2013 0748   CL 99 11/30/2012 0835   CL 99  11/05/2010 1036   CO2 24 02/16/2013 1429   CO2 25 02/01/2012 0500   CO2 26 11/05/2010 1036   BUN 17.3 02/16/2013 1429   BUN 18 01/15/2013 0748   BUN 14 11/05/2010 1036   CREATININE 1.3* 02/16/2013 1429   CREATININE 1.20* 01/15/2013 0748   CREATININE 0.8 11/05/2010 1036      Component Value Date/Time   CALCIUM 9.6 02/16/2013 1429   CALCIUM 8.6 02/01/2012 0500   CALCIUM 8.8 11/05/2010 1036   ALKPHOS 84 02/16/2013 1429   ALKPHOS 88 01/08/2012 0916   ALKPHOS 106* 11/05/2010 1036   AST 32 02/16/2013 1429   AST 24 01/08/2012 0916   AST 27 11/05/2010 1036   ALT 33 02/16/2013 1429   ALT 27 01/08/2012 0916   ALT 35 11/05/2010 1036   BILITOT 0.65 02/16/2013 1429   BILITOT 0.9 01/08/2012 0916   BILITOT 0.80 11/05/2010 1036       Lab Results  Component Value Date   LABCA2 107* 08/17/2012      STUDIES: Ct Soft Tissue Neck W Contrast  02/11/2013   *RADIOLOGY REPORT*  Clinical Data: Metastatic breast cancer follow-up  CT NECK WITH CONTRAST  Technique:  Multidetector CT imaging of the neck was performed with intravenous contrast.  Contrast: OMNIPAQUE IOHEXOL 300 MG/ML  SOLN  Comparison: CT neck 11/17/2012  Findings: Pathologic left supraclavicular lymph nodes have improved.  These have  irregular margins but are smaller compared to the prior study.  Index node on the prior study measured 14 x 15 mm now measures 7 x 12 mm.  Additional lymph nodes in this area also are smaller.  There is enhancing soft tissue tumor infiltrating around the left C8 and T1 nerve roots.  This also has improved in the interval.  Left level II node anterior to the left jugular vein measures 6.5 mm and is unchanged.  No new adenopathy is present.  Para pharyngeal soft tissues are normal.  Major salivary glands are normal.  Thyroid is normal.  See separate CT chest report for chest findings.  IMPRESSION: Left supraclavicular adenopathy has improved.  Tumor around the left C7-T1 foramen   also has  improved in the interval.  No new adenopathy.    Original Report Authenticated By: Janeece Riggers, M.D.   Ct Chest W Contrast  02/11/2013   *RADIOLOGY REPORT*  Clinical Data:  Metastatic breast cancer, status post left mastectomy, chemotherapy ongoing, XRT complete.  CT CHEST, ABDOMEN AND PELVIS WITH CONTRAST  Technique:  Multidetector CT imaging of the chest, abdomen and pelvis was performed following the standard protocol during bolus administration of intravenous contrast.  Contrast: OMNIPAQUE IOHEXOL 300 MG/ML  SOLN  Comparison:  11/17/2012  CT CHEST  Findings:  Status post left mastectomy with left axillary lymph node dissection.  Scarring/radiation changes in the anterior left upper lobe.  No suspicious pulmonary nodules. No pleural effusion or pneumothorax.  Visualized thyroid is unremarkable.  Heart is normal in size.  No pericardial effusion.  Right chest port.  No suspicious mediastinal, hilar, or axillary lymphadenopathy.  Small left supraclavicular nodes measuring up to 6 mm short axis (series 2/image 3), improved.  Visualized osseous structures are within normal limits.  IMPRESSION: Status post left mastectomy with left axillary lymph node dissection.  Scarring/radiation changes in the anterior left upper lobe.  Small left supraclavicular nodes measuring up to 6 mm short axis, improved.  No evidence of new/progressive metastatic disease in the chest.  CT ABDOMEN AND PELVIS  Findings:  Widespread hepatic metastases.  While many lesions are improving, there are also several new lesions on the current study. Representative lesions include: --2.2 x 2.4 cm lesion in the anterior right hepatic dome (series 2/image 42), new --3.1 x 2.3 cm lesion in the lateral segment left hepatic lobe, previously 2.2 x 2 x 1 cm --2.3 x 2.6 cm lesion in the medial segment left hepatic lobe (series 2/image 47), new --2.3 x 2.5 cm lesion in the medial segment left hepatic lobe (series 2/image 48), new --10 mm lesion in the posterior segment right hepatic lobe (series  2/image 51), previously 2.1 x 2.1 cm  Spleen, pancreas, and right adrenal gland are within normal limits.  5.6 x 4.8 cm heterogeneous left adrenal mass, previously 5.8 x 4.6 cm.  Status post cholecystectomy.  No intrahepatic or extrahepatic ductal dilatation.  Kidneys are notable for mild fullness of the bilateral renal collecting systems with indwelling bilateral pigtail ureteral stents.  No evidence of bowel obstruction.  Trace ascites in the right pelvis (series 2/image 01/07).  Improving nodal metastases, including: --3.6 x 5.7 cm right para-aortic nodal mass (series 3/image 18), previously 4.6 x 6.9 cm --4.0 x 3.6 cm left common iliac nodal mass (series 3/image 28), previously 5.5 x 4.6 cm --6 mm short-axis right inguinal node (series 2/image 114), previously 10 mm  Uterus and bilateral ovaries are grossly unremarkable.  Bladder is underdistended.  Mild  degenerative changes at L5-S1.  IMPRESSION: Progression of hepatic metastases, with index lesions as described above.  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  Retroperitoneal/right inguinal nodal metastases, decreased.   Original Report Authenticated By: Charline Bills, M.D.     Nm Pet Image Restag (ps) Skull Base To Thigh  02/11/2013   *RADIOLOGY REPORT*  Clinical Data: Subsequent treatment strategy for metastatic breast cancer.  NUCLEAR MEDICINE PET SKULL BASE TO THIGH  Fasting Blood Glucose:  87  Technique:  19.2 mCi F-18 FDG was injected intravenously. CT data was obtained and used for attenuation correction and anatomic localization only.  (This was not acquired as a diagnostic CT examination.) Additional exam technical data entered on technologist worksheet.  Comparison:  11/17/2012  Findings:  Neck: No hypermetabolic lymph nodes in the neck.  Small left supraclavicular nodes measuring up to 9 mm short axis (series 2/image 45), improved, max SUV 2.4 (previously max SUV 4.8).  Chest:  Status post left mastectomy with left axillary lymph node  dissection.  Mild hypermetabolism in the medial left chest wall, max SUV 4.5 (PET image 63), without definite CT correlate.  No hypermetabolic mediastinal or hilar nodes.  Scarring/radiation changes in the anterior left upper lobe.  No suspicious pulmonary nodules on the CT scan.  Right chest port.  Abdomen/Pelvis:  Widespread hepatic metastases, many of which are new.  Representative lesions include: --2.6 x 2.5 cm lesion in the anterior right hepatic dome (series 2/image 109) with max SUV 9.5, new --2.7 x 1.9 cm lesion in the lateral segment left hepatic lobe (series 2/image 111), max SUV 7.8, unchanged in size with prior max SUV 11.2 3.0 x 1.9 cm lesion in the medial segment left hepatic lobe (series 2/image 123) with max SUV 9.5, new  No abnormal hypermetabolic activity within the pancreas, right adrenal gland, or spleen.  5.9 x 5.1 cm left adrenal mass (series 2/image 131) with max SUV 11.1, previously 6.8 x 5.3 cm with max SUV 12.6.  Retroperitoneal nodal metastases, including:  --3.2 x 5.3 cm right para-aortic node (series 2/image 144) with max SUV 9.6, previously 3.7 x 6.9 cm with max SUV 12.1 --3.3 x 4.5 cm left para-aortic nodal mass (series 2/image 158) with max SUV 11.1, previously 6.6 x 3.8 cm with max SUV 11.9  7 mm short axis right inguinal node (series 2/image 220), decreased, now non-FDG-avid with max SUV 1.4 (previously max SUV 7.5).  Bilateral double pigtail ureteral stents in satisfactory position. No hydronephrosis.  Skeleton:  No focal hypermetabolic activity to suggest skeletal metastasis.  IMPRESSION: Mixed response to therapy.  Progression of hepatic metastases, with index lesions as described above, max SUV 9.5.  Nodal metastases in the left supraclavicular region, retroperitoneum, and right inguinal region are improved.  Mild hypermetabolism in the medial left chest wall, max SUV 4.5, without definite CT correlate.   Original Report Authenticated By: Charline Bills, M.D.      ASSESSMENT: 62 y.o.  [facility] woman with stage IV breast cancer.  1. 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 2. Status post 4 cycles of adjuvant docetaxel, carboplatin and trastuzumab completed November 2007. 3. Postmastectomy radiation completed February 2008. 4. Anastrozole begun February 2008 (the trastuzumab also was continued to complete a year), continued until June 2012.  5. 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 with left retrocrural and retroperitoneal adenopathy.  6. On tamoxifen/lapatinib/trastuzumab between June 2012 and October 2012.  7. Ixempra/lapatinib/trastuzumab started October 2012, the Ixempra discontinued in February 2013 due to peripheral neuropathy. 8. Continuing on lapatinib/trastuzumab with the addition of fulvestrant, first injections given on 07/31/2011. Lapatinib given at a dose of 1 g (4 tablets) daily, and trastuzumab given at 8 mg/kg every 4 weeks to coordinate with injection appointments. 9. Discontinued lapatinib and fulvestrant in December 2013 10. Started q. three-week docetaxel/ trastuzumab/ pertuzumab, first dose given 06/15/2012.  Docetaxel given at a 50% dose reduction beginning with cycle 2 due to poor tolerance and severe neutropenia. Completed 6 cycles as of 09/28/2012.  11. Trastuzumab/ pertuzumab continued  to June of 2014, with evidence of disease progression. Letrozole added 10/19/2012. 12. Bilateral hydronephrosis noted March 2014, bilateral ureteral stenting performed 09/11/2012 under Dr Brunilda Payor 13. Herpes Zoster June 2014, resolved 14. 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  An echocardiogram on 12/07/2012 showed a reduced ejection fraction of 40%, likely secondary to previous treatment  with trastuzumab. Subsequently, TDM-1 was discontinued. 15. Started carboplatin/gemcitabine, both agents to be given on days one and 8 of each 21 day cycle, first dose on 12/21/2012.  Carboplatin will be given at an AUC of 2, the gemcitabine at 1000 mg per meter square.  16. Scans in late August showed largely stable disease with the exception of new spots in the liver. Started back on lapatinib, 1000 mg daily, on 02/17/2013, to be given in addition to carboplatin/gemcitabine.   PLAN:  Nene will proceed to treatment today as scheduled for day 1 cycle 4 of carboplatin/gemcitabine. Of course she'll continue on oral lapatinib at 1000 mg daily which she seems to be tolerating well. Our plan is to continue this regimen for another 6 weeks (2 cycles) then repeat an MRI of the liver. This is already scheduled for October 9, and she is also scheduled for repeat echocardiogram on October 10.  [name] plans to discuss this case with Dr. Brunilda Payor to see if there is anything we might be able to do to make  [facility] more comfortable with regards to her stents. In the meanwhile, I am going to obtain a urinalysis and urine culture, just to be sure she is not developing a urinary tract infection with the increased dysuria and urinary frequency. I think it is likely that this is simply secondary to the stents, however.  All this was reviewed in detail with Bonita Quin today. She voices understanding and agreement with this plan, and will call with any changes or problems.     Leora Platt    02/22/2013

## 2013-02-23 ENCOUNTER — Other Ambulatory Visit: Payer: Self-pay | Admitting: Physician Assistant

## 2013-02-23 DIAGNOSIS — N1330 Unspecified hydronephrosis: Secondary | ICD-10-CM

## 2013-02-23 DIAGNOSIS — C50919 Malignant neoplasm of unspecified site of unspecified female breast: Secondary | ICD-10-CM

## 2013-02-23 DIAGNOSIS — R319 Hematuria, unspecified: Secondary | ICD-10-CM

## 2013-02-23 LAB — URINE CULTURE

## 2013-02-23 NOTE — Progress Notes (Signed)
I have reviewed Rebecca Pugh's case once again with [name]. We continue to be concerned about Rebecca Pugh's discomfort and decreased quality of life secondary to pain associated with her stents. There is now evidence of blood in the urine with increased urinary frequency. Accordingly, [name] have suggested a referral back to Dr. Brunilda Payor for further evaluation. The request is being placed today.  Zollie Scale, PA-C 02/23/2013

## 2013-02-24 ENCOUNTER — Telehealth: Payer: Self-pay | Admitting: Oncology

## 2013-03-01 ENCOUNTER — Ambulatory Visit (HOSPITAL_BASED_OUTPATIENT_CLINIC_OR_DEPARTMENT_OTHER): Payer: Medicare Other | Admitting: Physician Assistant

## 2013-03-01 ENCOUNTER — Telehealth: Payer: Self-pay | Admitting: Oncology

## 2013-03-01 ENCOUNTER — Encounter: Payer: Self-pay | Admitting: Physician Assistant

## 2013-03-01 ENCOUNTER — Other Ambulatory Visit (HOSPITAL_BASED_OUTPATIENT_CLINIC_OR_DEPARTMENT_OTHER): Payer: Medicare Other

## 2013-03-01 ENCOUNTER — Ambulatory Visit (HOSPITAL_BASED_OUTPATIENT_CLINIC_OR_DEPARTMENT_OTHER): Payer: Medicare Other

## 2013-03-01 VITALS — BP 92/64 | HR 104 | Temp 98.0°F | Resp 18 | Ht 64.0 in | Wt 119.7 lb

## 2013-03-01 DIAGNOSIS — C50219 Malignant neoplasm of upper-inner quadrant of unspecified female breast: Secondary | ICD-10-CM

## 2013-03-01 DIAGNOSIS — I10 Essential (primary) hypertension: Secondary | ICD-10-CM

## 2013-03-01 DIAGNOSIS — C7970 Secondary malignant neoplasm of unspecified adrenal gland: Secondary | ICD-10-CM

## 2013-03-01 DIAGNOSIS — C773 Secondary and unspecified malignant neoplasm of axilla and upper limb lymph nodes: Secondary | ICD-10-CM

## 2013-03-01 DIAGNOSIS — N1330 Unspecified hydronephrosis: Secondary | ICD-10-CM

## 2013-03-01 DIAGNOSIS — M21371 Foot drop, right foot: Secondary | ICD-10-CM

## 2013-03-01 DIAGNOSIS — F419 Anxiety disorder, unspecified: Secondary | ICD-10-CM

## 2013-03-01 DIAGNOSIS — D630 Anemia in neoplastic disease: Secondary | ICD-10-CM

## 2013-03-01 DIAGNOSIS — R630 Anorexia: Secondary | ICD-10-CM

## 2013-03-01 DIAGNOSIS — Z5111 Encounter for antineoplastic chemotherapy: Secondary | ICD-10-CM

## 2013-03-01 DIAGNOSIS — C50912 Malignant neoplasm of unspecified site of left female breast: Secondary | ICD-10-CM

## 2013-03-01 DIAGNOSIS — C801 Malignant (primary) neoplasm, unspecified: Secondary | ICD-10-CM

## 2013-03-01 DIAGNOSIS — K769 Liver disease, unspecified: Secondary | ICD-10-CM

## 2013-03-01 LAB — CBC WITH DIFFERENTIAL/PLATELET
BASO%: 1.2 % (ref 0.0–2.0)
EOS%: 0.6 % (ref 0.0–7.0)
HCT: 32.3 % — ABNORMAL LOW (ref 34.8–46.6)
LYMPH%: 32.1 % (ref 14.0–49.7)
MCH: 30.6 pg (ref 25.1–34.0)
MCHC: 34.1 g/dL (ref 31.5–36.0)
MCV: 90 fL (ref 79.5–101.0)
MONO%: 15.2 % — ABNORMAL HIGH (ref 0.0–14.0)
NEUT%: 50.9 % (ref 38.4–76.8)
Platelets: 272 10*3/uL (ref 145–400)

## 2013-03-01 LAB — COMPREHENSIVE METABOLIC PANEL (CC13)
ALT: 23 U/L (ref 0–55)
AST: 26 U/L (ref 5–34)
Creatinine: 1.5 mg/dL — ABNORMAL HIGH (ref 0.6–1.1)
Total Bilirubin: 1.39 mg/dL — ABNORMAL HIGH (ref 0.20–1.20)

## 2013-03-01 MED ORDER — SODIUM CHLORIDE 0.9 % IV SOLN
Freq: Once | INTRAVENOUS | Status: AC
  Administered 2013-03-01: 10:00:00 via INTRAVENOUS

## 2013-03-01 MED ORDER — SODIUM CHLORIDE 0.9 % IV SOLN
1000.0000 mg/m2 | Freq: Once | INTRAVENOUS | Status: AC
  Administered 2013-03-01: 1634 mg via INTRAVENOUS
  Filled 2013-03-01: qty 42.98

## 2013-03-01 MED ORDER — SODIUM CHLORIDE 0.9 % IJ SOLN
10.0000 mL | INTRAMUSCULAR | Status: DC | PRN
  Administered 2013-03-01: 10 mL
  Filled 2013-03-01: qty 10

## 2013-03-01 MED ORDER — ONDANSETRON 8 MG/50ML IVPB (CHCC)
8.0000 mg | Freq: Once | INTRAVENOUS | Status: AC
Start: 2013-03-01 — End: 2013-03-01
  Administered 2013-03-01: 8 mg via INTRAVENOUS

## 2013-03-01 MED ORDER — DEXAMETHASONE SODIUM PHOSPHATE 10 MG/ML IJ SOLN
INTRAMUSCULAR | Status: AC
  Filled 2013-03-01: qty 1

## 2013-03-01 MED ORDER — DEXAMETHASONE SODIUM PHOSPHATE 10 MG/ML IJ SOLN
10.0000 mg | Freq: Once | INTRAMUSCULAR | Status: AC
  Administered 2013-03-01: 10 mg via INTRAVENOUS

## 2013-03-01 MED ORDER — ONDANSETRON 8 MG/NS 50 ML IVPB
INTRAVENOUS | Status: AC
  Filled 2013-03-01: qty 8

## 2013-03-01 MED ORDER — SODIUM CHLORIDE 0.9 % IV SOLN
130.6000 mg | Freq: Once | INTRAVENOUS | Status: AC
  Administered 2013-03-01: 130 mg via INTRAVENOUS
  Filled 2013-03-01: qty 13

## 2013-03-01 MED ORDER — HEPARIN SOD (PORK) LOCK FLUSH 100 UNIT/ML IV SOLN
500.0000 [IU] | Freq: Once | INTRAVENOUS | Status: AC | PRN
  Administered 2013-03-01: 500 [IU]
  Filled 2013-03-01: qty 5

## 2013-03-01 NOTE — Progress Notes (Signed)
Patient ID: Rebecca Pugh, female   DOB: 09-19-1950, 62 y.o.   MRN: [id number] ID: Rebecca Pugh   DOB: 04-Apr-1951  MR#: [id number]  NWG#:[id number]  PCP: [name], MD GYN: [name] OTHER MD: [name]  HISTORY OF PRESENT ILLNESS: The patient and her husband, Rebecca Pugh, moved back to this area in 2007 from [place] (Rebecca Pugh actually is a native of [place] and incidentally remains a Herbalist).  As part of moving boxes, Rebecca Pugh felt something under her left arm.  She brought it to [name] attention and although the patient states that she has always had some cyclical swelling of her left axilla during menstruation, [name] set the patient up for evaluation at [facility].  On 12-12-05 the patient had a mammogram and ultrasound there and these showed no mass, distortion or microcalcifications in either breast however, in the left axilla there was a large macrolobulated mass and just above that there was a slightly prominent lymph node which did demonstrate a fatty hilum.  Physically on exam the mass was mobile and nontender and measured approximately 6.5 cm.  The ultrasound showed two small hypoechoic nodules in the left breast 3 cm. from the nipple measuring 7 mm. each and felt to be most likely benign however, the axillary mass, of course, was quite suspicious and biopsy was performed the same day.  This showed ([id number] and [id number]) invasive breast cancer which was strongly ER positive at 76%, moderately PR positive at 25% with a very high proliferation marker at 66%.  HER-2/neu was 1+ and FISH showed no amplification with a ratio of 1.1.    With this information, the patient was referred to Dr. [name] and on 12-20-05 the patient had bilateral breast MRI's.  In the left breast there was a 5.8 cm. axillary mass and several abnormally enlarged lymph nodes.  There was a 7 mm. left supraclavicular lymph node and the two nodules in the breast that had been noted by ultrasound  were also noted by MRI measuring 1.1 and 0.7 cm.  In the right breast there were no abnormalities noted.  MR guided biopsies of the left breast masses were obtained on 12-27-05 and both showed high grade ductal carcinoma.  There was evidence of lymphovascular invasion and high grade ductal carcinoma in situ as well ([id number]).    [name] discussed the situation with me and we felt it would be useful to have a PET scan prior to definitive surgery.  This was obtained on 12-30-05 and showed the primary axillary mass to have an SUV of 8.5.  The lymph nodes in the left axilla had SUV's in the 2.2 range, small focus of increased activity in the left breast had an SUV of 2.3.  There was also skin thickening with mildly increased diffuse FDG uptake within the skin suggesting an inflammatory breast cancer.  The neck, abdomen and  pelvis were negative.  In the lung windows, there were several scattered small areas of focal nodular density in the upper lobes with compressive atelectasis and this was felt to be benign.  Chest x-ray on 01-09-06 was negative.  With this information, [name] proceeded, after appropriate discussion, to left modified radical mastectomy on 01-15-06 and the insertion of a BARD port at the same time.  The final pathology ([id number]) showed the maximum tumor size to be 5 cm.  Margins were ample.  There was evidence of lymphovascular invasion.  This infiltrating ductal carcinoma was a grade 3  of 3 and 11 out of 14 lymph nodes were involved.  There was evidence of extracapsular extension.  Her subsequent history is as detailed below.   INTERVAL HISTORY: Atziri returns today accompanied by her husband Rebecca Pugh for followup of her metastatic breast carcinoma. She's currently receiving carboplatin/gemcitabine, both agents given on days one and 8 of each 21 day cycle. Today is day 8 cycle 4. She is also on lapatinib, 1000 mg daily.  Overall, Bahja feels that she has been tolerating treatment well,  and has no new complaints since her treatment here one week ago. She continues to alternate somewhat between loose stools and mild constipation. So far, this has been manageable.  Rebecca Pugh's biggest complaint continues to be the discomfort she feels secondary to her ureteral stents. She continues to have what she describes as urinary spasms, with mild dysuria and urinary frequency. Although she has seen no visible blood in the urine, a urinalysis last week did confirm a large amount of blood microscopically. Interval history is notable for the fact that she was evaluated last week in Dr. Madilyn Hook office.  She was prescribed to new medications (she could not remember the names of those) to try over the next couple of weeks. If these do not decrease her discomfort, Winifred tells me they may consider removing the stents since recent scans show some decrease in disease.   REVIEW OF SYSTEMS: Rebecca Pugh  denies any fevers or chills. Her energy level is reduced. She spends a good portion of the day in bed. She's had no rashes, skin changes, or evidence of PPE.   She denies any mouth ulcers or oral sensitivity. Her appetite is extremely reduced, and she finds it difficult to eat or drink. Fortunately, she denies any nausea or emesis. She continues to have some mild orthostatic hypotension and feels dizzy when she first stands up. Fortunately she has had no recent falls. She does have to be extremely careful walking due to her right foot drop, and this also decreases her mobility and her functional status. Currently, she denies any unusual myalgias, arthralgias, or bony pain. She's had no increased neuropathy, and no increased peripheral swelling. She denies any increased cough, phlegm production, or chest pain. She has some mild shortness of breath with exertion which is stable. She's had no abnormal headaches or change in vision.   A detailed review of systems is otherwise stable and noncontributory.    PAST MEDICAL  HISTORY: Past Medical History  Diagnosis Date  . Hypertension   . Depression   . Hydronephrosis, bilateral   . Breast cancer metastasized to multiple sites POSITIVE METS IN 2012-----  ONCOLOGIST--  DR Darnelle Catalan--  CURRENTLY RECEIVING CHEMO  EVERY 3 WEEKS (STARTED 06-15-2012)    FIRST DX 01-15-2006, INVASIVE DUCTAL GRADE III/    S/P LEFT MASTECTOMY AND CHEMORADIATION (CHEMO COMPLETE NOV 2007 AND XRT COMPLETE FEB 2008)  remote tonsillectomy, remote cholecystectomy, status post appendectomy, multiple skin biopsies for what the patient said were simple keratoses, and status post port placement.   FAMILY HISTORY: The patient has no information regarding her father. She was brought up by her stepfather. The patient's mother died at the age of 50 with bladder cancer. The patient has one sister who is in good health. There is no history of breast or ovarian cancer in the family to the patient's knowledge.    GYNECOLOGIC HISTORY: She is GX, P2. She was premenopausal at the time of her initial breast cancer diagnosis   SOCIAL HISTORY: She  and her husband, Rebecca Pugh, have been married 10 years. 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 She has two children, a daughter, [name], who lives in [place] and is a Investment banker, corporate, and a son, [name], also in [place], who works for [employer]. She has two grandchildren and three step-grandchildren from her own two children, [name] and [name], who are from her first marriage. Rebecca Pugh has one daughter from his first marriage and he has grandchildren through her. The patient was brought up as a Catholic but currently is not practicing. She and Rebecca Pugh are attending a [employer].      ADVANCED DIRECTIVES:  HEALTH MAINTENANCE: History  Substance Use Topics  . Smoking status: Former Smoker -- 20 years    Quit date: 01/31/1995  . Smokeless tobacco: Never Used  . Alcohol Use: Yes      Comment: occasionl     Colonoscopy:  PAP:  Bone density: August 2012, "Normal"  Lipid panel:  No Known Allergies  Current Outpatient Prescriptions  Medication Sig Dispense Refill  . calcium citrate (CALCITRATE - DOSED IN MG ELEMENTAL CALCIUM) 950 MG tablet Take 1 tablet by mouth daily.       . carvedilol (COREG) 3.125 MG tablet Take 3.125 mg by mouth at bedtime. Decreased to once daily at night per [name] as of 01/11/2013 d/t hypotension (88/54)      . cholecalciferol (VITAMIN D) 1000 UNITS tablet Take 3,000 Units by mouth daily.       . Desloratadine-Pseudoephedrine (CLARINEX-D 24 HOUR PO) Take 1 capsule by mouth daily.      Marland Kitchen FLUoxetine (PROZAC) 40 MG capsule TAKE ONE CAPSULE BY MOUTH ONCE DAILY  30 capsule  6  . gabapentin (NEURONTIN) 300 MG capsule Take 1 capsule (300 mg total) by mouth at bedtime.  30 capsule  5  . lapatinib (TYKERB) 250 MG tablet Take 4 tablets (1,000 mg total) by mouth daily.  120 tablet  3  . letrozole (FEMARA) 2.5 MG tablet       . lidocaine-prilocaine (EMLA) cream Apply topically as needed.  30 g  3  . LORazepam (ATIVAN) 0.5 MG tablet Take 1 tablet (0.5 mg total) by mouth 2 (two) times daily as needed.  60 tablet  0  . losartan (COZAAR) 50 MG tablet Take 0.5 tablets (25 mg total) by mouth daily.  30 tablet  6  . metoCLOPramide (REGLAN) 10 MG tablet Take 0.5 tablets (5 mg total) by mouth 4 (four) times daily -  before meals and at bedtime.  120 tablet  12  . omeprazole (PRILOSEC) 40 MG capsule Take 1 capsule (40 mg total) by mouth at bedtime.  30 capsule  12  . ondansetron (ZOFRAN) 8 MG tablet Take 8 mg by mouth 2 (two) times daily as needed for nausea.      Marland Kitchen oxyCODONE (OXY IR/ROXICODONE) 5 MG immediate release tablet Take 1 tablet (5 mg total) by mouth every 6 (six) hours as needed.  30 tablet  0  . potassium chloride (MICRO-K) 10 MEQ CR capsule Take 2 capsules (20 mEq total) by mouth daily.  60 capsule  1  . prochlorperazine (COMPAZINE) 10 MG tablet  Take 1 tablet (10 mg total) by mouth every 6 (six) hours as needed.  30 tablet  3  . cholestyramine (QUESTRAN) 4 G packet Take 1 packet by mouth 2 (two) times daily with a meal. Mix with water.      Marland Kitchen  lidocaine (XYLOCAINE) 5 % ointment Apply topically 2 (two) times daily as needed.  30 g  1  . loperamide (IMODIUM) 2 MG capsule Take 2 mg by mouth 4 (four) times daily as needed. diarrhea      . predniSONE (DELTASONE) 5 MG tablet TAKE ONE TABLET BY MOUTH ONCE DAILY.  TAKE FOR 4 DAYS AFTER DISCONTINUING DEXAMETHASONE  30 tablet  0   No current facility-administered medications for this visit.   Facility-Administered Medications Ordered in Other Visits  Medication Dose Route Frequency Provider Last Rate Last Dose  . 0.9 %  sodium chloride infusion   Intravenous Continuous Chasey Dull Allegra Grana, PA-C 500 mL/hr at 02/09/13 1524    . heparin lock flush 100 unit/mL  500 Units Intravenous Once Lowella Dell, MD      . sodium chloride 0.9 % injection 10 mL  10 mL Intravenous PRN Lowella Dell, MD      . sodium chloride 0.9 % injection 10 mL  10 mL Intracatheter PRN Catalina Gravel, PA-C   10 mL at 03/01/13 1225     OBJECTIVE:  Filed Vitals:   03/01/13 0842  BP: 92/64  Pulse: 104  Temp: 98 F (36.7 C)  Resp: 18  Body mass index is 20.54 kg/(m^2). Filed Weights   03/01/13 0842  Weight: 119 lb 11.2 oz (54.296 kg)   Middle-aged white woman who appears weak but is in no acute distress  ECOG: 2  Sclerae unicteric Oropharynx clear, with no ulcerations. Buccal mucosa is pink, slightly dry. There is a small palpable lymph node in the left cervical chain, but no additional cervical or supraclavicular adenopathy on either the right or the left.  Breast exam deferred. Axillae are benign bilaterally, no palpable adenopathy Lungs clear to auscultation, no rales or rhonchi Heart regular rate and rhythm, no murmur appreciated Abdomen nontender, soft to palpation, positive bowel sounds, no hepatomegaly palpated   MSK no focal spinal tenderness to gentle palpation No CVA tenderness No peripheral edema, continued right foot drop with right ankle brace in place Neuro: nonfocal, well oriented, Pleasant affect Breasts: Deferred. No axillary adenopathy palpated.   LAB RESULTS:  Lab Results  Component Value Date   WBC 3.4* 03/01/2013   NEUTROABS 1.7 03/01/2013   HGB 11.0* 03/01/2013   HCT 32.3* 03/01/2013   MCV 90.0 03/01/2013   PLT 272 03/01/2013      Chemistry      Component Value Date/Time   NA 139 03/01/2013 0822   NA 140 01/15/2013 0748   NA 142 11/05/2010 1036   K 3.5 03/01/2013 0822   K 3.2* 01/15/2013 0748   K 4.5 11/05/2010 1036   CL 102 01/15/2013 0748   CL 99 11/30/2012 0835   CL 99 11/05/2010 1036   CO2 22 03/01/2013 0822   CO2 25 02/01/2012 0500   CO2 26 11/05/2010 1036   BUN 26.2* 03/01/2013 0822   BUN 18 01/15/2013 0748   BUN 14 11/05/2010 1036   CREATININE 1.5* 03/01/2013 0822   CREATININE 1.20* 01/15/2013 0748   CREATININE 0.8 11/05/2010 1036      Component Value Date/Time   CALCIUM 10.0 03/01/2013 0822   CALCIUM 8.6 02/01/2012 0500   CALCIUM 8.8 11/05/2010 1036   ALKPHOS 91 03/01/2013 0822   ALKPHOS 88 01/08/2012 0916   ALKPHOS 106* 11/05/2010 1036   AST 26 03/01/2013 0822   AST 24 01/08/2012 0916   AST 27 11/05/2010 1036   ALT 23 03/01/2013 0822   ALT  27 01/08/2012 0916   ALT 35 11/05/2010 1036   BILITOT 1.39* 03/01/2013 0822   BILITOT 0.9 01/08/2012 0916   BILITOT 0.80 11/05/2010 1036        STUDIES:  No new studies.    ASSESSMENT: 62 y.o.  [facility] woman with stage IV breast cancer.  1. 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 2. Status post 4 cycles of adjuvant docetaxel, carboplatin and trastuzumab completed November 2007. 3. Postmastectomy radiation completed February 2008. 4. Anastrozole begun February 2008 (the trastuzumab also was continued to complete a year),  continued until June 2012.  5. 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 with left retrocrural and retroperitoneal adenopathy.  6. On tamoxifen/lapatinib/trastuzumab between June 2012 and October 2012.  7. Ixempra/lapatinib/trastuzumab started October 2012, the Ixempra discontinued in February 2013 due to peripheral neuropathy. 8. Continuing on lapatinib/trastuzumab with the addition of fulvestrant, first injections given on 07/31/2011. Lapatinib given at a dose of 1 g (4 tablets) daily, and trastuzumab given at 8 mg/kg every 4 weeks to coordinate with injection appointments. 9. Discontinued lapatinib and fulvestrant in December 2013 10. Started q. three-week docetaxel/ trastuzumab/ pertuzumab, first dose given 06/15/2012.  Docetaxel given at a 50% dose reduction beginning with cycle 2 due to poor tolerance and severe neutropenia. Completed 6 cycles as of 09/28/2012.  11. Trastuzumab/ pertuzumab continued  to June of 2014, with evidence of disease progression. Letrozole added 10/19/2012. 12. Bilateral hydronephrosis noted March 2014, bilateral ureteral stenting performed 09/11/2012 under Dr Brunilda Payor 13. Herpes Zoster June 2014, resolved 14. 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  An echocardiogram on 12/07/2012 showed a reduced ejection fraction of 40%, likely secondary to previous treatment with trastuzumab. Subsequently, TDM-1 was discontinued. 15. Started carboplatin/gemcitabine, both agents to be given on days one and 8 of each 21 day cycle, first dose on 12/21/2012.  Carboplatin will be given at an AUC of 2, the gemcitabine at 1000 mg per meter square.  16. Scans in late August showed largely stable disease with the exception of new spots in the liver. Started back on lapatinib, 1000 mg daily, on 02/17/2013, to be given in addition to  carboplatin/gemcitabine.   PLAN:  Nevin will proceed to treatment today as scheduled for day 8 cycle 4 of carboplatin/gemcitabine. Of course she'll continue on oral lapatinib at 1000 mg daily, but I have asked her to contact us with any increase in loose stools. At this time, since she alternates somewhat between loose stools and constipation, it is hard to treat either issue without causing the opposite problem.   We spent time today discussing Latoy's diet, and I encouraged her to add supplements such as Boost or Ensure. I encouraged her to have post next throughout the day since she is unable to eat a large amount at any one time. Of course, I also reiterated the importance of keeping herself well hydrated.  This is likely to also help her hypertension, sometimes resulting in dizziness.  I am concerned about Tametra being a fall risk secondary to her weakness, and also her right foot drop. We're going to recommend a home health evaluation to assist with ambulation and safety in the home. I think that physical therapy and occupational therapy would be helpful for [facility] in order to reduce her risk of  falls, and increase her quality of life.  She'll continue to follow with Dr. Brunilda Payor with regards to her ureteral stents, and the hope is that she will be able to have these removed soon since that is causing her a great bit of discomfort.   We will see Orrie again in 2 weeks, September 29, in anticipation of day 1 cycle 5.  Our plan is to continue Brandon's current regimen for a total of 5 cycles, then repeat an MRI of the liver. This is already scheduled for October 9, and she is also scheduled for repeat echocardiogram on October 10.  All this was reviewed in detail with Bonita Quin today. She voices understanding and agreement with this plan, and will call with any changes or problems.     Dayra Rapley    03/01/2013

## 2013-03-01 NOTE — Telephone Encounter (Signed)
, °

## 2013-03-01 NOTE — Patient Instructions (Addendum)
[facility] Discharge Instructions for Patients Receiving Chemotherapy  Today you received the following chemotherapy agents carboplatin, gemzar  To help prevent nausea and vomiting after your treatment, we encourage you to take your nausea medication Ativan, Compazine suppositories 25mg  1 per rectum every 6 hours as needed for nausea or vomiting, Compazine 10 mg tablets and Zofran 8mg  every 8 hours as needed for nausea or vomiting   If you develop nausea and vomiting that is not controlled by your nausea medication, call the clinic.   BELOW ARE SYMPTOMS THAT SHOULD BE REPORTED IMMEDIATELY:  *FEVER GREATER THAN 100.5 F  *CHILLS WITH OR WITHOUT FEVER  NAUSEA AND VOMITING THAT IS NOT CONTROLLED WITH YOUR NAUSEA MEDICATION  *UNUSUAL SHORTNESS OF BREATH  *UNUSUAL BRUISING OR BLEEDING  TENDERNESS IN MOUTH AND THROAT WITH OR WITHOUT PRESENCE OF ULCERS  *URINARY PROBLEMS  *BOWEL PROBLEMS  UNUSUAL RASH Items with * indicate a potential emergency and should be followed up as soon as possible.  Feel free to call the clinic you have any questions or concerns. The clinic phone number is [phone].

## 2013-03-04 ENCOUNTER — Encounter: Payer: Self-pay | Admitting: Oncology

## 2013-03-05 ENCOUNTER — Telehealth: Payer: Self-pay | Admitting: *Deleted

## 2013-03-05 NOTE — Telephone Encounter (Signed)
Patient calling to speak with Zollie Scale, PA, in regards to "issues we discussed at last office visit with my stents". Will notify of request.

## 2013-03-06 ENCOUNTER — Other Ambulatory Visit: Payer: Self-pay | Admitting: Physician Assistant

## 2013-03-10 ENCOUNTER — Telehealth: Payer: Self-pay | Admitting: *Deleted

## 2013-03-10 NOTE — Telephone Encounter (Signed)
Patient calling in to ask for refill on Tykerb, however, patient should contact her pharmacy, 3 additional refills were given on Rx from 02/16/13. Also, patient continues to have increasing pain due to stents. Patient does not have appt with Dr Brunilda Payor until 03/26/13. She complains that pain is debilitating to the point she can not do anything anymore. Called and left voicemail on nurse line, Windell Moulding, with Dr Brunilda Payor to look into the matter and attempt to get patient in sooner, call us if anything we can do.

## 2013-03-12 HISTORY — PX: STENT REMOVAL: SHX6421

## 2013-03-15 ENCOUNTER — Ambulatory Visit (HOSPITAL_BASED_OUTPATIENT_CLINIC_OR_DEPARTMENT_OTHER): Payer: Medicare Other | Admitting: Family

## 2013-03-15 ENCOUNTER — Emergency Department (HOSPITAL_COMMUNITY): Payer: Medicare Other

## 2013-03-15 ENCOUNTER — Ambulatory Visit (HOSPITAL_COMMUNITY)
Admission: RE | Admit: 2013-03-15 | Discharge: 2013-03-15 | Disposition: A | Discharge status: Home / Self Care | Payer: Medicare Other | Source: Ambulatory Visit | Attending: Oncology | Admitting: Oncology

## 2013-03-15 ENCOUNTER — Other Ambulatory Visit: Payer: Medicare Other | Admitting: Lab

## 2013-03-15 ENCOUNTER — Inpatient Hospital Stay (HOSPITAL_COMMUNITY)
Admission: EM | Admit: 2013-03-15 | Discharge: 2013-03-18 | DRG: 682 | Disposition: A | Discharge status: Home / Self Care | Payer: Medicare Other | Attending: Internal Medicine | Admitting: Internal Medicine

## 2013-03-15 ENCOUNTER — Other Ambulatory Visit (HOSPITAL_BASED_OUTPATIENT_CLINIC_OR_DEPARTMENT_OTHER): Payer: Medicare Other | Admitting: Lab

## 2013-03-15 ENCOUNTER — Encounter (HOSPITAL_COMMUNITY): Payer: Self-pay | Admitting: *Deleted

## 2013-03-15 ENCOUNTER — Encounter: Payer: Self-pay | Admitting: Family

## 2013-03-15 ENCOUNTER — Ambulatory Visit (HOSPITAL_BASED_OUTPATIENT_CLINIC_OR_DEPARTMENT_OTHER): Payer: Medicare Other

## 2013-03-15 ENCOUNTER — Telehealth: Payer: Self-pay | Admitting: Oncology

## 2013-03-15 VITALS — BP 107/68 | HR 85 | Temp 98.4°F | Resp 20 | Ht 64.0 in | Wt 128.2 lb

## 2013-03-15 DIAGNOSIS — F419 Anxiety disorder, unspecified: Secondary | ICD-10-CM

## 2013-03-15 DIAGNOSIS — IMO0002 Reserved for concepts with insufficient information to code with codable children: Secondary | ICD-10-CM

## 2013-03-15 DIAGNOSIS — E876 Hypokalemia: Secondary | ICD-10-CM | POA: Diagnosis present

## 2013-03-15 DIAGNOSIS — Z853 Personal history of malignant neoplasm of breast: Secondary | ICD-10-CM

## 2013-03-15 DIAGNOSIS — C7970 Secondary malignant neoplasm of unspecified adrenal gland: Secondary | ICD-10-CM

## 2013-03-15 DIAGNOSIS — I959 Hypotension, unspecified: Secondary | ICD-10-CM

## 2013-03-15 DIAGNOSIS — R0602 Shortness of breath: Secondary | ICD-10-CM

## 2013-03-15 DIAGNOSIS — R630 Anorexia: Secondary | ICD-10-CM

## 2013-03-15 DIAGNOSIS — N1330 Unspecified hydronephrosis: Secondary | ICD-10-CM

## 2013-03-15 DIAGNOSIS — F3289 Other specified depressive episodes: Secondary | ICD-10-CM | POA: Diagnosis present

## 2013-03-15 DIAGNOSIS — E41 Nutritional marasmus: Secondary | ICD-10-CM | POA: Diagnosis present

## 2013-03-15 DIAGNOSIS — C50219 Malignant neoplasm of upper-inner quadrant of unspecified female breast: Secondary | ICD-10-CM

## 2013-03-15 DIAGNOSIS — T451X5A Adverse effect of antineoplastic and immunosuppressive drugs, initial encounter: Secondary | ICD-10-CM | POA: Diagnosis present

## 2013-03-15 DIAGNOSIS — T380X5A Adverse effect of glucocorticoids and synthetic analogues, initial encounter: Secondary | ICD-10-CM | POA: Diagnosis present

## 2013-03-15 DIAGNOSIS — C801 Malignant (primary) neoplasm, unspecified: Secondary | ICD-10-CM

## 2013-03-15 DIAGNOSIS — R609 Edema, unspecified: Secondary | ICD-10-CM | POA: Diagnosis present

## 2013-03-15 DIAGNOSIS — Z79899 Other long term (current) drug therapy: Secondary | ICD-10-CM

## 2013-03-15 DIAGNOSIS — E860 Dehydration: Secondary | ICD-10-CM

## 2013-03-15 DIAGNOSIS — R339 Retention of urine, unspecified: Secondary | ICD-10-CM | POA: Diagnosis present

## 2013-03-15 DIAGNOSIS — F411 Generalized anxiety disorder: Secondary | ICD-10-CM | POA: Diagnosis present

## 2013-03-15 DIAGNOSIS — R197 Diarrhea, unspecified: Secondary | ICD-10-CM | POA: Diagnosis present

## 2013-03-15 DIAGNOSIS — D6481 Anemia due to antineoplastic chemotherapy: Secondary | ICD-10-CM | POA: Diagnosis present

## 2013-03-15 DIAGNOSIS — Z5111 Encounter for antineoplastic chemotherapy: Secondary | ICD-10-CM

## 2013-03-15 DIAGNOSIS — C50912 Malignant neoplasm of unspecified site of left female breast: Secondary | ICD-10-CM

## 2013-03-15 DIAGNOSIS — M216X9 Other acquired deformities of unspecified foot: Secondary | ICD-10-CM

## 2013-03-15 DIAGNOSIS — M21371 Foot drop, right foot: Secondary | ICD-10-CM

## 2013-03-15 DIAGNOSIS — N179 Acute kidney failure, unspecified: Secondary | ICD-10-CM

## 2013-03-15 DIAGNOSIS — F32A Depression, unspecified: Secondary | ICD-10-CM

## 2013-03-15 DIAGNOSIS — R7309 Other abnormal glucose: Secondary | ICD-10-CM | POA: Diagnosis present

## 2013-03-15 DIAGNOSIS — R5381 Other malaise: Secondary | ICD-10-CM

## 2013-03-15 DIAGNOSIS — C773 Secondary and unspecified malignant neoplasm of axilla and upper limb lymph nodes: Secondary | ICD-10-CM

## 2013-03-15 DIAGNOSIS — R931 Abnormal findings on diagnostic imaging of heart and coronary circulation: Secondary | ICD-10-CM

## 2013-03-15 DIAGNOSIS — R109 Unspecified abdominal pain: Secondary | ICD-10-CM | POA: Diagnosis present

## 2013-03-15 DIAGNOSIS — N3289 Other specified disorders of bladder: Secondary | ICD-10-CM | POA: Diagnosis present

## 2013-03-15 DIAGNOSIS — C50919 Malignant neoplasm of unspecified site of unspecified female breast: Secondary | ICD-10-CM | POA: Insufficient documentation

## 2013-03-15 DIAGNOSIS — Z9221 Personal history of antineoplastic chemotherapy: Secondary | ICD-10-CM

## 2013-03-15 DIAGNOSIS — D649 Anemia, unspecified: Secondary | ICD-10-CM

## 2013-03-15 DIAGNOSIS — F329 Major depressive disorder, single episode, unspecified: Secondary | ICD-10-CM | POA: Diagnosis present

## 2013-03-15 DIAGNOSIS — N139 Obstructive and reflux uropathy, unspecified: Secondary | ICD-10-CM

## 2013-03-15 DIAGNOSIS — M25512 Pain in left shoulder: Secondary | ICD-10-CM

## 2013-03-15 DIAGNOSIS — D72819 Decreased white blood cell count, unspecified: Secondary | ICD-10-CM | POA: Diagnosis present

## 2013-03-15 DIAGNOSIS — D630 Anemia in neoplastic disease: Secondary | ICD-10-CM

## 2013-03-15 DIAGNOSIS — I10 Essential (primary) hypertension: Secondary | ICD-10-CM

## 2013-03-15 DIAGNOSIS — Z9010 Acquired absence of unspecified breast and nipple: Secondary | ICD-10-CM

## 2013-03-15 LAB — COMPREHENSIVE METABOLIC PANEL (CC13)
AST: 20 U/L (ref 5–34)
Albumin: 2.8 g/dL — ABNORMAL LOW (ref 3.5–5.0)
BUN: 21.8 mg/dL (ref 7.0–26.0)
Calcium: 8.5 mg/dL (ref 8.4–10.4)
Chloride: 106 mEq/L (ref 98–109)
Creatinine: 2.8 mg/dL — ABNORMAL HIGH (ref 0.6–1.1)
Glucose: 114 mg/dl (ref 70–140)

## 2013-03-15 LAB — CBC WITH DIFFERENTIAL/PLATELET
BASO%: 0.8 % (ref 0.0–2.0)
Basophils Absolute: 0 10*3/uL (ref 0.0–0.1)
Basophils Absolute: 0 10*3/uL (ref 0.0–0.1)
Basophils Relative: 0 % (ref 0–1)
Eosinophils Absolute: 0 10*3/uL (ref 0.0–0.7)
Eosinophils Relative: 0 % (ref 0–5)
HCT: 24.2 % — ABNORMAL LOW (ref 34.8–46.6)
HCT: 21.2 % — ABNORMAL LOW (ref 36.0–46.0)
HGB: 8 g/dL — ABNORMAL LOW (ref 11.6–15.9)
Hemoglobin: 7.1 g/dL — ABNORMAL LOW (ref 12.0–15.0)
LYMPH%: 14.2 % (ref 14.0–49.7)
Lymphocytes Relative: 5 % — ABNORMAL LOW (ref 12–46)
MCH: 31 pg (ref 25.1–34.0)
MCH: 31 pg (ref 26.0–34.0)
MCHC: 33.1 g/dL (ref 31.5–36.0)
MCHC: 33.5 g/dL (ref 30.0–36.0)
MCV: 92.6 fL (ref 78.0–100.0)
MONO#: 0.5 10*3/uL (ref 0.1–0.9)
Monocytes Relative: 10 % (ref 3–12)
NEUT%: 62.8 % (ref 38.4–76.8)
Neutro Abs: 2.1 10*3/uL (ref 1.7–7.7)
Platelets: 170 10*3/uL (ref 145–400)
Platelets: 154 10*3/uL (ref 150–400)
RDW: 18.5 % — ABNORMAL HIGH (ref 11.5–15.5)
WBC: 2.5 10*3/uL — ABNORMAL LOW (ref 4.0–10.5)

## 2013-03-15 LAB — BASIC METABOLIC PANEL
BUN: 33 mg/dL — ABNORMAL HIGH (ref 6–23)
Calcium: 8 mg/dL — ABNORMAL LOW (ref 8.4–10.5)
Chloride: 100 mEq/L (ref 96–112)
Creatinine, Ser: 3.96 mg/dL — ABNORMAL HIGH (ref 0.50–1.10)
GFR calc Af Amer: 13 mL/min — ABNORMAL LOW (ref >90)
GFR calc non Af Amer: 11 mL/min — ABNORMAL LOW (ref >90)
Sodium: 134 mEq/L — ABNORMAL LOW (ref 135–145)

## 2013-03-15 MED ORDER — ONDANSETRON 8 MG/NS 50 ML IVPB
INTRAVENOUS | Status: AC
  Filled 2013-03-15: qty 8

## 2013-03-15 MED ORDER — SODIUM CHLORIDE 0.9 % IV SOLN
1000.0000 mg/m2 | Freq: Once | INTRAVENOUS | Status: AC
  Administered 2013-03-15: 1634 mg via INTRAVENOUS
  Filled 2013-03-15: qty 42.98

## 2013-03-15 MED ORDER — OXYCODONE HCL 5 MG PO TABS: 5.0000 mg | ORAL_TABLET | Freq: Four times a day (QID) | ORAL | Status: DC | PRN

## 2013-03-15 MED ORDER — ONDANSETRON 8 MG/50ML IVPB (CHCC)
8.0000 mg | Freq: Once | INTRAVENOUS | Status: AC
  Administered 2013-03-15: 8 mg via INTRAVENOUS

## 2013-03-15 MED ORDER — HEPARIN SOD (PORK) LOCK FLUSH 100 UNIT/ML IV SOLN
500.0000 [IU] | Freq: Once | INTRAVENOUS | Status: AC | PRN
  Administered 2013-03-15: 500 [IU]
  Filled 2013-03-15: qty 5

## 2013-03-15 MED ORDER — SODIUM CHLORIDE 0.9 % IV SOLN
Freq: Once | INTRAVENOUS | Status: AC
  Administered 2013-03-15: 11:00:00 via INTRAVENOUS

## 2013-03-15 MED ORDER — DEXAMETHASONE SODIUM PHOSPHATE 10 MG/ML IJ SOLN
INTRAMUSCULAR | Status: AC
Start: 2013-03-15 — End: 2013-03-15
  Filled 2013-03-15: qty 1

## 2013-03-15 MED ORDER — SODIUM CHLORIDE 0.9 % IV SOLN
110.0000 mg | Freq: Once | INTRAVENOUS | Status: AC
  Administered 2013-03-15: 110 mg via INTRAVENOUS
  Filled 2013-03-15: qty 11

## 2013-03-15 MED ORDER — DEXAMETHASONE SODIUM PHOSPHATE 10 MG/ML IJ SOLN
10.0000 mg | Freq: Once | INTRAMUSCULAR | Status: AC
  Administered 2013-03-15: 10 mg via INTRAVENOUS

## 2013-03-15 MED ORDER — SODIUM CHLORIDE 0.9 % IJ SOLN
10.0000 mL | INTRAMUSCULAR | Status: DC | PRN
  Administered 2013-03-15: 10 mL
  Filled 2013-03-15: qty 10

## 2013-03-15 NOTE — Patient Instructions (Addendum)
Please contact us at [phone] if you have any questions or concerns.  Please continue to do well and enjoy life!!!  Get plenty of rest, drink plenty of water, exercise daily (walking as tolerated with assistance), eat a balanced diet.  Results for orders placed in visit on 03/15/13 (from the past 24 hour(s))  CBC WITH DIFFERENTIAL     Status: Abnormal   Collection Time    03/15/13  9:10 AM      Result Value Range   WBC 2.6 (*) 3.9 - 10.3 10e3/uL   NEUT# 1.6  1.5 - 6.5 10e3/uL   HGB 8.0 (*) 11.6 - 15.9 g/dL   HCT 40.9 (*) 81.1 - 91.4 %   Platelets 170  145 - 400 10e3/uL   MCV 93.8  79.5 - 101.0 fL   MCH 31.0  25.1 - 34.0 pg   MCHC 33.1  31.5 - 36.0 g/dL   RBC 7.82 (*) 9.56 - 2.13 10e6/uL   RDW 18.9 (*) 11.2 - 14.5 %   lymph# 0.4 (*) 0.9 - 3.3 10e3/uL   MONO# 0.5  0.1 - 0.9 10e3/uL   Eosinophils Absolute 0.1  0.0 - 0.5 10e3/uL   Basophils Absolute 0.0  0.0 - 0.1 10e3/uL   NEUT% 62.8  38.4 - 76.8 %   LYMPH% 14.2  14.0 - 49.7 %   MONO% 19.5 (*) 0.0 - 14.0 %   EOS% 2.7  0.0 - 7.0 %   BASO% 0.8  0.0 - 2.0 %   nRBC 0  0 - 0 %   Narrative:    Performed At:  [facility]               [address]               [place], [place] [address]

## 2013-03-15 NOTE — Telephone Encounter (Signed)
gv pt appt schedule for October. Pt has new add on appt for blood 9/30. Pt will also be out of town 10/27 and will talk to AB at visit next wk about when her next tx will be since she will be out of town 10/27. Pt has tx day1, day8 q21 days and would be due for tx again 10/20 and 10/27.

## 2013-03-15 NOTE — Patient Instructions (Addendum)
[facility] Discharge Instructions for Patients Receiving Chemotherapy  Today you received the following chemotherapy agents carboplatin and gemzar.  To help prevent nausea and vomiting after your treatment, we encourage you to take your nausea medication zofran and reglan.   If you develop nausea and vomiting that is not controlled by your nausea medication, call the clinic.   BELOW ARE SYMPTOMS THAT SHOULD BE REPORTED IMMEDIATELY:  *FEVER GREATER THAN 100.5 F  *CHILLS WITH OR WITHOUT FEVER  NAUSEA AND VOMITING THAT IS NOT CONTROLLED WITH YOUR NAUSEA MEDICATION  *UNUSUAL SHORTNESS OF BREATH  *UNUSUAL BRUISING OR BLEEDING  TENDERNESS IN MOUTH AND THROAT WITH OR WITHOUT PRESENCE OF ULCERS  *URINARY PROBLEMS  *BOWEL PROBLEMS  UNUSUAL RASH Items with * indicate a potential emergency and should be followed up as soon as possible.  Feel free to call the clinic you have any questions or concerns. The clinic phone number is [phone].

## 2013-03-15 NOTE — Progress Notes (Signed)
Ok to treat despite WBC 2.6, Hgb 8.0  VO [facility].  Will T & C today and transfuse tomorrow.

## 2013-03-15 NOTE — ED Notes (Signed)
Pt bladder scan read 15ml PA aware.

## 2013-03-15 NOTE — ED Provider Notes (Signed)
CSN: [id number]     Arrival date & time 03/15/13  2018 History   First MD Initiated Contact with Patient 03/15/13 2038     Chief Complaint  Patient presents with  . Urinary Retention   (Consider location/radiation/quality/duration/timing/severity/associated sxs/prior Treatment) HPI Comments: Patient with a history of metastatic breast cancer currently undergoing chemotherapy presents today due to decreased urination.  She reports that the last time she urinated was 7 AM this morning.  She had bilateral urinary stents removed by Dr. Brunilda Payor three days ago.  Stents were placed due to urinary obstruction caused by the metastatic tumors.  She denies fever or chills.  Denies any abdominal or flank pain.  Denies nausea or vomiting.  She reports that she has been eating and drinking normally today.  She was seen by her Oncologist earlier today and was found to have a Creatine of 2.8 at that time.  She was also found to be anemic and is scheduled to have 2 Units PRBC's transfused tomorrow.    The history is provided by the patient.    Past Medical History  Diagnosis Date  . Hypertension   . Depression   . Hydronephrosis, bilateral   . Breast cancer metastasized to multiple sites POSITIVE METS IN 2012-----  ONCOLOGIST--  DR Darnelle Catalan--  CURRENTLY RECEIVING CHEMO  EVERY 3 WEEKS (STARTED 06-15-2012)    FIRST DX 01-15-2006, INVASIVE DUCTAL GRADE III/    S/P LEFT MASTECTOMY AND CHEMORADIATION (CHEMO COMPLETE NOV 2007 AND XRT COMPLETE FEB 2008)   Past Surgical History  Procedure Laterality Date  . Orif ankle fracture  01/31/2012    Procedure: OPEN REDUCTION INTERNAL FIXATION (ORIF) ANKLE FRACTURE;  Surgeon: Loanne Drilling, MD;  Location: [facility];  Service: Orthopedics;  Laterality: Right;  . Port-a-cath placement  12-05-2010  . Mastectomy modified radical Left 01-15-2006    W/ LYMPH NODE DISSECTIONS AND PORT-A-CATH PLACEMENT (REMOVAL 04-06-2007)  . Transthoracic echocardiogram  09-08-2012    MILD LVF/   NORMAL LVSF/ EF 60-65%  . Cholecystectomy  1977    AND APPENDECTOMY  . Tonsillectomy  AS CHILD  . Cystoscopy w/ ureteral stent placement Bilateral 09/11/2012    Procedure: CYSTOSCOPY WITH RETROGRADE PYELOGRAM/URETERAL STENT PLACEMENT;  Surgeon: Lindaann Slough, MD;  Location: [facility] [place];  Service: Urology;  Laterality: Bilateral;  1 HR   . Transthoracic echocardiogram  09-08-2012    mild lvf/ lvsf normal/ ef 60-65%  . Cystoscopy with stent placement N/A 10/20/2012    Procedure: CYSTOSCOPY, bilateral retrogrades WITH bilateral  STENT RE PLACEMENTs;  Surgeon: Lindaann Slough, MD;  Location: [facility];  Service: Urology;  Laterality: N/A;  BILATERAL JJ STENT PLACEMENT   . Cystoscopy w/ ureteral stent placement Bilateral 01/15/2013    Procedure: CYSTOSCOPY WITH STENT REPLACEMENT;  Surgeon: Lindaann Slough, MD;  Location: [facility];  Service: Urology;  Laterality: Bilateral;  . Stent removal Bilateral 03/12/2013    Ureteral stent  . Appendectomy    . Skin biopsy      Simple keratoses   Family History  Problem Relation Age of Onset  . COPD Mother   . Cancer Mother     Bladder cancer  . Healthy Sister    History  Substance Use Topics  . Smoking status: Former Smoker -- 20 years    Quit date: 01/31/1995  . Smokeless tobacco: Never Used  . Alcohol Use: Yes     Comment: Occasional   OB History   Grav Para Term Preterm Abortions TAB  SAB Ect Mult Living                 Review of Systems  Genitourinary: Positive for decreased urine volume.  All other systems reviewed and are negative.    Allergies  Review of patient's allergies indicates no known allergies.  Home Medications   Current Outpatient Rx  Name  Route  Sig  Dispense  Refill  . calcium citrate (CALCITRATE - DOSED IN MG ELEMENTAL CALCIUM) 950 MG tablet   Oral   Take 1 tablet by mouth daily.          Marland Kitchen FLUoxetine (PROZAC) 40 MG capsule      TAKE ONE CAPSULE BY MOUTH  ONCE DAILY   30 capsule   6   . gabapentin (NEURONTIN) 300 MG capsule   Oral   Take 1 capsule (300 mg total) by mouth at bedtime.   30 capsule   5   . lapatinib (TYKERB) 250 MG tablet   Oral   Take 4 tablets (1,000 mg total) by mouth daily.   120 tablet   3   . letrozole (FEMARA) 2.5 MG tablet   Oral   Take 2.5 mg by mouth daily.          Marland Kitchen lidocaine-prilocaine (EMLA) cream   Topical   Apply topically as needed.   30 g   3   . loperamide (IMODIUM) 2 MG capsule   Oral   Take 2 mg by mouth 4 (four) times daily as needed. diarrhea         . LORazepam (ATIVAN) 0.5 MG tablet   Oral   Take 1 tablet (0.5 mg total) by mouth 2 (two) times daily as needed.   60 tablet   0   . metoCLOPramide (REGLAN) 10 MG tablet   Oral   Take 0.5 tablets (5 mg total) by mouth 4 (four) times daily -  before meals and at bedtime.   120 tablet   12   . omeprazole (PRILOSEC) 40 MG capsule   Oral   Take 1 capsule (40 mg total) by mouth at bedtime.   30 capsule   12   . ondansetron (ZOFRAN) 8 MG tablet   Oral   Take 8 mg by mouth 2 (two) times daily as needed for nausea.         Marland Kitchen oxyCODONE (OXY IR/ROXICODONE) 5 MG immediate release tablet   Oral   Take 1 tablet (5 mg total) by mouth every 6 (six) hours as needed.   30 tablet   0   . predniSONE (DELTASONE) 5 MG tablet      TAKE ONE TABLET BY MOUTH ONCE DAILY.  TAKE FOR 4 DAYS AFTER DISCONTINUING DEXAMETHASONE.   30 tablet   0   . prochlorperazine (COMPAZINE) 10 MG tablet   Oral   Take 1 tablet (10 mg total) by mouth every 6 (six) hours as needed.   30 tablet   3   . cholecalciferol (VITAMIN D) 1000 UNITS tablet   Oral   Take 3,000 Units by mouth daily.           BP 139/55  Pulse 83  Temp(Src) 98.9 F (37.2 C) (Oral)  Resp 20  Ht 5\' 4"  (1.626 m)  Wt 128 lb (58.06 kg)  BMI 21.96 kg/m2  SpO2 98% Physical Exam  Nursing note and vitals reviewed. Constitutional: She appears well-developed and well-nourished. She  has a sickly appearance.  HENT:  Head: Normocephalic and atraumatic.  Mouth/Throat: Oropharynx is clear and moist.  Neck: Normal range of motion. Neck supple.  Cardiovascular: Normal rate, regular rhythm and normal heart sounds.   Pulmonary/Chest: Effort normal and breath sounds normal.  Abdominal: Soft. Bowel sounds are normal. She exhibits no distension and no mass. There is no tenderness. There is no rebound, no guarding and no CVA tenderness.  Neurological: She is alert.  Skin: Skin is warm and dry.  Psychiatric: She has a normal mood and affect.    ED Course  Procedures (including critical care time) Labs Review Labs Reviewed  CBC WITH DIFFERENTIAL  BASIC METABOLIC PANEL   Imaging Review No results found. 10:52 PM Discussed with Dr Mena Goes with Urology.  He recommends having the patient admitted to hospitalist service.  He reports that he will consult on the patient.  Discussed with Dr. Toniann Fail who has agreed to admit the patient.  MDM  No diagnosis found. Patient with a history of metastatic breast cancer who recently had bilateral urinary stents removed three days ago presents with inability to urinate.  Bladder scan performed and showed 15 cc in the bladder.  Creatine has risen from 2.8 this morning to 3.9 this evening.  Patient also found to have a hemoglobin of 7.0.  One unit PRBC's ordered.  Urology consulted and reported that they will consult on the patient.  Patient admitted to [facility] for further management.    Pascal Lux Sedan, PA-C 03/16/13 [phone]

## 2013-03-15 NOTE — ED Notes (Signed)
Pt states had urinary stents removed on Friday; unable to urinate today; no fever

## 2013-03-15 NOTE — ED Notes (Signed)
NO Blood draws or BP on the (L) side

## 2013-03-15 NOTE — Progress Notes (Signed)
[facility]  [phone] [phone]  OFFICE PROGRESS NOTE  ID: Rebecca Pugh   DOB: 1951-01-16  MR#: [id number]  GNF#:[id number]   PCP: [name], MD SU: [name], MD URO: Toma Copier. Nesi, MD   HISTORY OF PRESENT ILLNESS: The patient and her husband, Rebecca Pugh, moved back to this area in 2007 from [facility] (Rebecca Pugh actually is a native of [place] and incidentally remains a Herbalist).  As part of moving boxes, [name] felt something under her left arm.  She brought it to [name] attention and although the patient states that she has always had some cyclical swelling of her left axilla during menstruation, [name] set the patient up for evaluation at [facility].  On 12-12-2005 the patient had a mammogram and ultrasound there and these showed no mass, distortion or microcalcifications in either breast however, in the left axilla there was a large macrolobulated mass and just above that there was a slightly prominent lymph node which did demonstrate a fatty hilum.  Physically on exam the mass was mobile and nontender and measured approximately 6.5 cm.  The ultrasound showed two small hypoechoic nodules in the left breast 3 cm. from the nipple measuring 7 mm. each and felt to be most likely benign however, the axillary mass, of course, was quite suspicious and biopsy was performed the same day. This showed ([id number] and [id number]) invasive breast cancer which was strongly ER positive at 76%, moderately PR positive at 25% with a very high proliferation marker at 66%.  HER-2/neu was 1+ and FISH showed no amplification with a ratio of 1.1.    With this information, the patient was referred to Dr. [name] and on 12-20-05 the patient had bilateral breast MRI's.  In the left breast there was a 5.8 cm. axillary mass and several abnormally enlarged lymph nodes.  There was a 7 mm. left supraclavicular lymph node and the two nodules in the breast that  had been noted by ultrasound were also noted by MRI measuring 1.1 and 0.7 cm.  In the right breast there were no abnormalities noted.  MR guided biopsies of the left breast masses were obtained on 12-27-05 and both showed high grade ductal carcinoma.  There was evidence of lymphovascular invasion and high grade ductal carcinoma in situ as well ([id number]).    [name] discussed the situation with [name] and they felt it would be useful to have a PET scan prior to definitive surgery.  This was obtained on 12-30-2005 and showed the primary axillary mass to have an SUV of 8.5.  The lymph nodes in the left axilla had SUV's in the 2.2 range, small focus of increased activity in the left breast had an SUV of 2.3.  There was also skin thickening with mildly increased diffuse FDG uptake within the skin suggesting an inflammatory breast cancer.  The neck, abdomen and  pelvis were negative.  In the lung windows, there were several scattered small areas of focal nodular density in the upper lobes with compressive atelectasis and this was felt to be benign.  Chest x-ray on 01-09-2006 was negative.  With this information, [name] proceeded, after appropriate discussion, to left modified radical mastectomy on 01-15-2006 and the insertion of a BARD port at the same time.  The final pathology ([id number]) showed the maximum tumor size to be 5 cm.  Margins were ample.  There was evidence of lymphovascular invasion.  This infiltrating ductal carcinoma was a  grade 3 of 3 and 11 out of 14 lymph nodes were involved.  There was evidence of extracapsular extension.  Her subsequent history is as detailed below.   INTERVAL HISTORY: Rebecca Pugh returns today accompanied by her husband Rebecca Pugh for followup of her metastatic breast carcinoma. She's currently receiving carboplatin/gemcitabine, both agents given on days one and 8 of each 21 day cycle. Today is day 1 cycle 5. She is also on lapatinib, 1000 mg daily.  Overall, Rebecca Pugh  feels that she has been tolerating treatment well, and has no new complaints since her last treatment cycle.  Her interval history is significant for having bilateral ureteral stents removed by Dr. Brunilda Payor on 03/12/2013.  Since her stent removal, she states that she's been feeling very well.  Her interval history is otherwise unremarkable.    REVIEW OF SYSTEMS: A 10 point review of systems was completed and Rebecca Pugh has continued orthostatic hypertension, shortness of breath with exertion and a decreased energy level.  She has been consuming Slimfast as a nutritional supplement in addition to a balanced diet.  She denies any other symptomatology including fever or chills, headache, vision changes, swollen glands, cough or shortness of breath, chest pain or discomfort, nausea, vomiting, diarrhea, constipation, change in urinary or bowel habits, arthralgias/myalgias, unusual bleeding/bruising or any other symptomatology.  A detailed review of systems is otherwise stable and noncontributory.   PAST MEDICAL HISTORY: Past Medical History  Diagnosis Date  . Hypertension   . Depression   . Hydronephrosis, bilateral   . Breast cancer metastasized to multiple sites POSITIVE METS IN 2012-----  ONCOLOGIST--  DR Darnelle Catalan--  CURRENTLY RECEIVING CHEMO  EVERY 3 WEEKS (STARTED 06-15-2012)    FIRST DX 01-15-2006, INVASIVE DUCTAL GRADE III/    S/P LEFT MASTECTOMY AND CHEMORADIATION (CHEMO COMPLETE NOV 2007 AND XRT COMPLETE FEB 2008)    PAST SURGICAL HISTORY: Past Surgical History  Procedure Laterality Date  . Orif ankle fracture  01/31/2012    Procedure: OPEN REDUCTION INTERNAL FIXATION (ORIF) ANKLE FRACTURE;  Surgeon: Loanne Drilling, MD;  Location: [facility];  Service: Orthopedics;  Laterality: Right;  . Port-a-cath placement  12-05-2010  . Mastectomy modified radical Left 01-15-2006    W/ LYMPH NODE DISSECTIONS AND PORT-A-CATH PLACEMENT (REMOVAL 04-06-2007)  . Transthoracic echocardiogram  09-08-2012     MILD LVF/  NORMAL LVSF/ EF 60-65%  . Cholecystectomy  1977    AND APPENDECTOMY  . Tonsillectomy  AS CHILD  . Cystoscopy w/ ureteral stent placement Bilateral 09/11/2012    Procedure: CYSTOSCOPY WITH RETROGRADE PYELOGRAM/URETERAL STENT PLACEMENT;  Surgeon: Lindaann Slough, MD;  Location: [facility];  Service: Urology;  Laterality: Bilateral;  1 HR   . Transthoracic echocardiogram  09-08-2012    mild lvf/ lvsf normal/ ef 60-65%  . Cystoscopy with stent placement N/A 10/20/2012    Procedure: CYSTOSCOPY, bilateral retrogrades WITH bilateral  STENT RE PLACEMENTs;  Surgeon: Lindaann Slough, MD;  Location: [facility];  Service: Urology;  Laterality: N/A;  BILATERAL JJ STENT PLACEMENT   . Cystoscopy w/ ureteral stent placement Bilateral 01/15/2013    Procedure: CYSTOSCOPY WITH STENT REPLACEMENT;  Surgeon: Lindaann Slough, MD;  Location: [facility] [place];  Service: Urology;  Laterality: Bilateral;  . Stent removal Bilateral 03/12/2013    Ureteral stent  . Appendectomy    . Skin biopsy      Simple keratoses     FAMILY HISTORY:  Family History  Problem Relation Age of Onset  . COPD Mother   .  Cancer Mother     Bladder cancer  . Healthy Sister   The patient has no information regarding her father. She was brought up by her stepfather. The patient's mother died at the age of 78 with bladder cancer. The patient has one sister who is in good health. There is no history of breast or ovarian cancer in the family to the patient's knowledge.    GYNECOLOGIC HISTORY: She is G2, P2, age of menses 82, age parity 72, age of menopause 23.   SOCIAL HISTORY: She and her husband, Rebecca Pugh, have been married 10 years. 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 She has two children, a daughter, [name], who lives in [place] and is a Investment banker, corporate, and a son, [name], also in [place], who works for  [employer]. She has two grandchildren and three step-grandchildren from her own two children, [name] and [name], who are from her first marriage. Rebecca Pugh has one daughter from his first marriage and he has grandchildren through her. The patient was brought up as a Catholic but currently is not practicing. She and Rebecca Pugh are attending a [employer].     ADVANCED DIRECTIVES: Not on file   HEALTH MAINTENANCE: History  Substance Use Topics  . Smoking status: Former Smoker -- 20 years    Quit date: 01/31/1995  . Smokeless tobacco: Never Used  . Alcohol Use: Yes     Comment: Occasional     Colonoscopy:  Not on file  PAP: Not on file  Bone density: August 2012, "Normal"  Lipid panel: Not on file  No Known Allergies  Current Outpatient Prescriptions  Medication Sig Dispense Refill  . letrozole (FEMARA) 2.5 MG tablet Take 2.5 mg by mouth daily.       Marland Kitchen lidocaine-prilocaine (EMLA) cream Apply topically as needed.  30 g  3  . loperamide (IMODIUM) 2 MG capsule Take 2 mg by mouth 4 (four) times daily as needed. diarrhea      . LORazepam (ATIVAN) 0.5 MG tablet Take 1 tablet (0.5 mg total) by mouth 2 (two) times daily as needed.  60 tablet  0  . metoCLOPramide (REGLAN) 10 MG tablet Take 0.5 tablets (5 mg total) by mouth 4 (four) times daily -  before meals and at bedtime.  120 tablet  12  . omeprazole (PRILOSEC) 40 MG capsule Take 1 capsule (40 mg total) by mouth at bedtime.  30 capsule  12  . ondansetron (ZOFRAN) 8 MG tablet Take 8 mg by mouth 2 (two) times daily as needed for nausea.      Marland Kitchen oxyCODONE (OXY IR/ROXICODONE) 5 MG immediate release tablet Take 1 tablet (5 mg total) by mouth every 6 (six) hours as needed.  30 tablet  0  . predniSONE (DELTASONE) 5 MG tablet TAKE ONE TABLET BY MOUTH ONCE DAILY.  TAKE FOR 4 DAYS AFTER DISCONTINUING DEXAMETHASONE.  30 tablet  0  . prochlorperazine (COMPAZINE) 10 MG tablet Take 1 tablet (10 mg total) by mouth every 6 (six) hours as needed.  30 tablet   3  . calcium citrate (CALCITRATE - DOSED IN MG ELEMENTAL CALCIUM) 950 MG tablet Take 1 tablet by mouth daily.       . carvedilol (COREG) 3.125 MG tablet Take 3.125 mg by mouth at bedtime. Decreased to once daily at night per [name] as of 01/11/2013 d/t hypotension (88/54)      . cholecalciferol (  VITAMIN D) 1000 UNITS tablet Take 3,000 Units by mouth daily.       . cholestyramine (QUESTRAN) 4 G packet Take 1 packet by mouth 2 (two) times daily with a meal. Mix with water.      Marland Kitchen FLUoxetine (PROZAC) 40 MG capsule TAKE ONE CAPSULE BY MOUTH ONCE DAILY  30 capsule  6  . gabapentin (NEURONTIN) 300 MG capsule Take 1 capsule (300 mg total) by mouth at bedtime.  30 capsule  5  . lapatinib (TYKERB) 250 MG tablet Take 4 tablets (1,000 mg total) by mouth daily.  120 tablet  3  . lidocaine (XYLOCAINE) 5 % ointment Apply topically 2 (two) times daily as needed.  30 g  1  . losartan (COZAAR) 50 MG tablet Take 0.5 tablets (25 mg total) by mouth daily.  30 tablet  6  . potassium chloride (MICRO-K) 10 MEQ CR capsule Take 2 capsules (20 mEq total) by mouth daily.  60 capsule  1   No current facility-administered medications for this visit.   Facility-Administered Medications Ordered in Other Visits  Medication Dose Route Frequency Provider Last Rate Last Dose  . 0.9 %  sodium chloride infusion   Intravenous Continuous [name] G Berry, PA-C 500 mL/hr at 02/09/13 1524    . CARBOplatin (PARAPLATIN) 110 mg in sodium chloride 0.9 % 100 mL chemo infusion  110 mg Intravenous Once Lowella Dell, MD 222 mL/hr at 03/15/13 1227 110 mg at 03/15/13 1227  . heparin lock flush 100 unit/mL  500 Units Intravenous Once Lowella Dell, MD      . heparin lock flush 100 unit/mL  500 Units Intracatheter Once PRN Lowella Dell, MD      . sodium chloride 0.9 % injection 10 mL  10 mL Intravenous PRN Lowella Dell, MD      . sodium chloride 0.9 % injection 10 mL  10 mL Intracatheter PRN Lowella Dell, MD          OBJECTIVE:  Middle-aged white woman in no acute distress: Filed Vitals:   03/15/13 0931  BP: 107/68  Pulse: 85  Temp: 98.4 F (36.9 C)  Resp: 20  Body mass index is 21.99 kg/(m^2). Filed Weights   03/15/13 0931  Weight: 128 lb 3.2 oz (58.151 kg)   ECOG FS: 1 - Symptomatic but completely ambulatory  General appearance: Alert, cooperative, thin frame, no apparent distress Head: Chemotherapy-induced alopecia, normocephalic, atraumatic, the patient is wearing a hat Eyes: Conjunctivae/corneas clear, PERRLA, EOMI Nose: Nares, septum and mucosa are normal, no drainage or sinus tenderness Neck: No adenopathy, supple, symmetrical, trachea midline, no tenderness Resp: Clear to auscultation bilaterally, no wheezes/rales/rhonchi Cardio: Regular rate and rhythm, S1, S2 normal, no murmur, click, rub or gallop, no edema, right chest Port-A-Cath covered in EMLA cream Breasts:  Deferred GI: Soft, not distended, non-tender, normoactive bowel sounds, no organomegaly Skin: No rashes/lesions, skin warm and dry, no erythematous areas, no cyanosis  M/S:  Atraumatic, limited strength in all extremities, limited range of motion in all extremities (RUE/RLE > LUE/LLE), no clubbing  Lymph nodes: Cervical, supraclavicular, and axillary nodes normal Neurologic: Grossly normal, cranial nerves II through XII intact, alert and oriented x 3 Psych: Appropriate affect   LAB RESULTS:  Lab Results  Component Value Date   WBC 2.6* 03/15/2013   NEUTROABS 1.6 03/15/2013   HGB 8.0* 03/15/2013   HCT 24.2* 03/15/2013   MCV 93.8 03/15/2013   PLT 170 03/15/2013      Chemistry  Component Value Date/Time   NA 140 03/15/2013 0911   NA 140 01/15/2013 0748   NA 142 11/05/2010 1036   K 4.0 03/15/2013 0911   K 3.2* 01/15/2013 0748   K 4.5 11/05/2010 1036   CL 102 01/15/2013 0748   CL 99 11/30/2012 0835   CL 99 11/05/2010 1036   CO2 23 03/15/2013 0911   CO2 25 02/01/2012 0500   CO2 26 11/05/2010 1036   BUN 21.8 03/15/2013 0911    BUN 18 01/15/2013 0748   BUN 14 11/05/2010 1036   CREATININE 2.8* 03/15/2013 0911   CREATININE 1.20* 01/15/2013 0748   CREATININE 0.8 11/05/2010 1036      Component Value Date/Time   CALCIUM 8.5 03/15/2013 0911   CALCIUM 8.6 02/01/2012 0500   CALCIUM 8.8 11/05/2010 1036   ALKPHOS 95 03/15/2013 0911   ALKPHOS 88 01/08/2012 0916   ALKPHOS 106* 11/05/2010 1036   AST 20 03/15/2013 0911   AST 24 01/08/2012 0916   AST 27 11/05/2010 1036   ALT 13 03/15/2013 0911   ALT 27 01/08/2012 0916   ALT 35 11/05/2010 1036   BILITOT 0.78 03/15/2013 0911   BILITOT 0.9 01/08/2012 0916   BILITOT 0.80 11/05/2010 1036        STUDIES: No results found.    ASSESSMENT: 62 y.o.  [place] with stage IV breast cancer:  1. 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  2. Status post 4 cycles of adjuvant docetaxel, carboplatin and trastuzumab completed November 2007.  3. Postmastectomy radiation completed February 2008.  4. Anastrozole begun February 2008 (the trastuzumab also was continued to complete a year), continued until June 2012.  5. 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 with left retrocrural and retroperitoneal adenopathy.   6. On tamoxifen/lapatinib/trastuzumab between June 2012 and October 2012.   7. Ixempra/lapatinib/trastuzumab started October 2012, the Ixempra discontinued in February 2013 due to peripheral neuropathy.   8. Continuing on lapatinib/trastuzumab with the addition of fulvestrant, first injections given on 07/31/2011. Lapatinib given at a dose of 1 g (4 tablets) daily, and trastuzumab given at 8 mg/kg every 4 weeks to coordinate with injection appointments.  9. Discontinued lapatinib and fulvestrant in December  2013.  10. Started q. three-week docetaxel/ trastuzumab/ pertuzumab, first dose given 06/15/2012.  Docetaxel given at a 50% dose reduction beginning with cycle 2 due to poor tolerance and severe neutropenia. Completed 6 cycles as of 09/28/2012.   11. Trastuzumab/ pertuzumab continued  to June of 2014, with evidence of disease progression. Letrozole added 10/19/2012.  12. Bilateral hydronephrosis noted March 2014, bilateral ureteral stenting performed 09/11/2012 under Dr Brunilda Payor.  13. Herpes Zoster June 2014, resolved.  14. 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  An echocardiogram on 12/07/2012 showed a reduced ejection fraction of 40%, likely secondary to previous treatment with trastuzumab. Subsequently, TDM-1 was discontinued.  15. Started carboplatin/gemcitabine, both agents to be given on days one and 8 of each 21 day cycle, first dose on 12/21/2012.  Carboplatin will be given at an AUC of 2, the gemcitabine at 1000 mg per meter square.   16. Scans in late 01/2013 showed largely stable disease with the exception of new spots in the liver. Started back on lapatinib, 1000 mg daily, on 02/17/2013, to be given  in addition to carboplatin/gemcitabine.   PLAN:  Rebecca Pugh will proceed today with chemotherapy as scheduled for day 1 of cycle 5 consisting of carboplatin/gemcitabine.  She will also continue on oral lapatinib 1000 mg by mouth daily.  A written prescription was provided to Rebecca Pugh today for oxycodone 5 mg - take 1 tablet by mouth every 6 hours as needed for pain #30 with 0 refills.  Hemoglobin/hematocrit today at 8.0/24.2 respectively -she is scheduled to receive 2 units packed red blood cells/blood transfusion tomorrow.   With regards to her ongoing weakness/deconditioning/limited mobility/shortness of breath with exertion/right foot drop we have requested home physical therapy/assessment and rehabilitation.  With regards to renal impairment [name] and Dr. Brunilda Payor spoke.   Dr. Brunilda Payor would like to see Rebecca Pugh as soon as possible and asks that she schedule an office visit with him.  Rebecca Pugh asked to increase her oral intake of water in the meantime.  We plan to see Rebecca Pugh again in 2 weeks on 03/22/2013 for day 8 of cycle 5.  Our plan is to continue Rebecca Pugh current regimen through cycle 5, then repeat MRI of the liver that is scheduled for 03/25/2013 and a repeat echocardiogram that is scheduled for 03/26/2013.  All questions answered.  Mr. and Mrs. Mourer were encouraged to contact us in the interim with any questions, problems, or concerns.     Larina Bras, NP-C 03/15/2013  12:48 PM

## 2013-03-16 ENCOUNTER — Encounter (HOSPITAL_COMMUNITY): Payer: Self-pay | Admitting: Anesthesiology

## 2013-03-16 ENCOUNTER — Encounter (HOSPITAL_COMMUNITY): Payer: Self-pay | Admitting: Internal Medicine

## 2013-03-16 ENCOUNTER — Other Ambulatory Visit: Payer: Self-pay | Admitting: *Deleted

## 2013-03-16 ENCOUNTER — Inpatient Hospital Stay (HOSPITAL_COMMUNITY): Payer: Medicare Other | Admitting: Anesthesiology

## 2013-03-16 ENCOUNTER — Encounter (HOSPITAL_COMMUNITY)
Admission: EM | Disposition: A | Discharge status: Home / Self Care | Payer: Self-pay | Source: Home / Self Care | Attending: Internal Medicine

## 2013-03-16 DIAGNOSIS — D649 Anemia, unspecified: Secondary | ICD-10-CM

## 2013-03-16 DIAGNOSIS — N139 Obstructive and reflux uropathy, unspecified: Secondary | ICD-10-CM

## 2013-03-16 DIAGNOSIS — N179 Acute kidney failure, unspecified: Principal | ICD-10-CM

## 2013-03-16 DIAGNOSIS — N1330 Unspecified hydronephrosis: Secondary | ICD-10-CM

## 2013-03-16 DIAGNOSIS — C800 Disseminated malignant neoplasm, unspecified: Secondary | ICD-10-CM

## 2013-03-16 DIAGNOSIS — C50919 Malignant neoplasm of unspecified site of unspecified female breast: Secondary | ICD-10-CM

## 2013-03-16 HISTORY — PX: CYSTOSCOPY W/ URETERAL STENT PLACEMENT: SHX1429

## 2013-03-16 LAB — BASIC METABOLIC PANEL
BUN: 40 mg/dL — ABNORMAL HIGH (ref 6–23)
CO2: 21 mEq/L (ref 19–32)
Calcium: 8.1 mg/dL — ABNORMAL LOW (ref 8.4–10.5)
Chloride: 103 mEq/L (ref 96–112)
Creatinine, Ser: 4.73 mg/dL — ABNORMAL HIGH (ref 0.50–1.10)
GFR calc non Af Amer: 9 mL/min — ABNORMAL LOW (ref >90)

## 2013-03-16 LAB — GLUCOSE, CAPILLARY
Glucose-Capillary: 128 mg/dL — ABNORMAL HIGH (ref 70–99)
Glucose-Capillary: 119 mg/dL — ABNORMAL HIGH (ref 70–99)
Glucose-Capillary: 102 mg/dL — ABNORMAL HIGH (ref 70–99)
Glucose-Capillary: 91 mg/dL (ref 70–99)

## 2013-03-16 LAB — CBC
MCH: 30.2 pg (ref 26.0–34.0)
MCV: 88.9 fL (ref 78.0–100.0)
Platelets: 177 10*3/uL (ref 150–400)
RBC: 2.98 MIL/uL — ABNORMAL LOW (ref 3.87–5.11)
RDW: 19.1 % — ABNORMAL HIGH (ref 11.5–15.5)
WBC: 3.7 10*3/uL — ABNORMAL LOW (ref 4.0–10.5)

## 2013-03-16 LAB — PHOSPHORUS: Phosphorus: 4.3 mg/dL (ref 2.3–4.6)

## 2013-03-16 LAB — PREPARE RBC (CROSSMATCH)

## 2013-03-16 LAB — MRSA PCR SCREENING: MRSA by PCR: NEGATIVE

## 2013-03-16 LAB — HEMOGLOBIN A1C
Hgb A1c MFr Bld: 5.8 % — ABNORMAL HIGH (ref <5.7)
Mean Plasma Glucose: 120 mg/dL — ABNORMAL HIGH (ref <117)

## 2013-03-16 SURGERY — CYSTOSCOPY, WITH RETROGRADE PYELOGRAM AND URETERAL STENT INSERTION
Anesthesia: General | Laterality: Bilateral | Wound class: Clean Contaminated

## 2013-03-16 MED ORDER — LAPATINIB DITOSYLATE 250 MG PO TABS: 1000.0000 mg | ORAL_TABLET | Freq: Every day | ORAL | Status: DC

## 2013-03-16 MED ORDER — SODIUM CHLORIDE 0.9 % IJ SOLN: 10.0000 mL | INTRAMUSCULAR | Status: DC | PRN

## 2013-03-16 MED ORDER — FENTANYL CITRATE 0.05 MG/ML IJ SOLN
25.0000 ug | INTRAMUSCULAR | Status: DC | PRN
  Administered 2013-03-16 (×4): 50 ug via INTRAVENOUS

## 2013-03-16 MED ORDER — LACTATED RINGERS IV SOLN
INTRAVENOUS | Status: DC | PRN
  Administered 2013-03-16: 12:00:00 via INTRAVENOUS

## 2013-03-16 MED ORDER — EPHEDRINE SULFATE 50 MG/ML IJ SOLN
INTRAMUSCULAR | Status: DC | PRN
  Administered 2013-03-16 (×2): 5 mg via INTRAVENOUS

## 2013-03-16 MED ORDER — SODIUM CHLORIDE 0.9 % IR SOLN
Status: DC | PRN
  Administered 2013-03-16: 3000 mL

## 2013-03-16 MED ORDER — FENTANYL CITRATE 0.05 MG/ML IJ SOLN
INTRAMUSCULAR | Status: AC
  Filled 2013-03-16: qty 2

## 2013-03-16 MED ORDER — BELLADONNA ALKALOIDS-OPIUM 16.2-60 MG RE SUPP
RECTAL | Status: AC
  Filled 2013-03-16: qty 1

## 2013-03-16 MED ORDER — SODIUM CHLORIDE 0.9 % IJ SOLN: 3.0000 mL | INTRAMUSCULAR | Status: DC | PRN

## 2013-03-16 MED ORDER — HEPARIN SOD (PORK) LOCK FLUSH 100 UNIT/ML IV SOLN
250.0000 [IU] | INTRAVENOUS | Status: DC | PRN
  Filled 2013-03-16: qty 3

## 2013-03-16 MED ORDER — HYDRALAZINE HCL 20 MG/ML IJ SOLN: 10.0000 mg | INTRAMUSCULAR | Status: DC | PRN

## 2013-03-16 MED ORDER — BELLADONNA ALKALOIDS-OPIUM 16.2-60 MG RE SUPP
1.0000 | Freq: Once | RECTAL | Status: AC
  Administered 2013-03-16: 1 via RECTAL

## 2013-03-16 MED ORDER — MORPHINE SULFATE 2 MG/ML IJ SOLN
1.0000 mg | INTRAMUSCULAR | Status: DC | PRN
  Administered 2013-03-16 – 2013-03-17 (×3): 1 mg via INTRAVENOUS
  Filled 2013-03-16 (×3): qty 1

## 2013-03-16 MED ORDER — SODIUM CHLORIDE 0.9 % IV SOLN
INTRAVENOUS | Status: DC
  Administered 2013-03-18: 02:00:00 via INTRAVENOUS

## 2013-03-16 MED ORDER — PROMETHAZINE HCL 25 MG/ML IJ SOLN: 6.2500 mg | INTRAMUSCULAR | Status: DC | PRN

## 2013-03-16 MED ORDER — LIDOCAINE HCL (PF) 2 % IJ SOLN
INTRAMUSCULAR | Status: DC | PRN
  Administered 2013-03-16: 75 mg

## 2013-03-16 MED ORDER — CEFAZOLIN SODIUM-DEXTROSE 2-3 GM-% IV SOLR
2.0000 g | Freq: Once | INTRAVENOUS | Status: AC
  Administered 2013-03-16: 2 g via INTRAVENOUS

## 2013-03-16 MED ORDER — MIDAZOLAM HCL 5 MG/5ML IJ SOLN
INTRAMUSCULAR | Status: DC | PRN
  Administered 2013-03-16: 2 mg via INTRAVENOUS

## 2013-03-16 MED ORDER — ONDANSETRON HCL 4 MG PO TABS: 4.0000 mg | ORAL_TABLET | Freq: Four times a day (QID) | ORAL | Status: DC | PRN

## 2013-03-16 MED ORDER — ACETAMINOPHEN 325 MG PO TABS: 650.0000 mg | ORAL_TABLET | Freq: Four times a day (QID) | ORAL | Status: DC | PRN

## 2013-03-16 MED ORDER — SODIUM CHLORIDE 0.9 % IV SOLN: 250.0000 mL | Freq: Once | INTRAVENOUS | Status: DC

## 2013-03-16 MED ORDER — LACTATED RINGERS IV SOLN: INTRAVENOUS | Status: DC

## 2013-03-16 MED ORDER — PROPOFOL 10 MG/ML IV BOLUS
INTRAVENOUS | Status: DC | PRN
  Administered 2013-03-16: 150 mg via INTRAVENOUS
  Administered 2013-03-16: 50 mg via INTRAVENOUS

## 2013-03-16 MED ORDER — SODIUM CHLORIDE 0.9 % IJ SOLN
3.0000 mL | Freq: Two times a day (BID) | INTRAMUSCULAR | Status: DC
  Administered 2013-03-16: 10:00:00 3 mL via INTRAVENOUS

## 2013-03-16 MED ORDER — FENTANYL CITRATE 0.05 MG/ML IJ SOLN: 25.0000 ug | INTRAMUSCULAR | Status: DC | PRN

## 2013-03-16 MED ORDER — ACETAMINOPHEN 325 MG PO TABS: 650.0000 mg | ORAL_TABLET | Freq: Once | ORAL | Status: DC

## 2013-03-16 MED ORDER — IOHEXOL 300 MG/ML  SOLN
INTRAMUSCULAR | Status: DC | PRN
  Administered 2013-03-16: 10 mL

## 2013-03-16 MED ORDER — SODIUM CHLORIDE 0.9 % IJ SOLN
10.0000 mL | INTRAMUSCULAR | Status: DC | PRN
  Administered 2013-03-17 – 2013-03-18 (×2): 10 mL

## 2013-03-16 MED ORDER — ONDANSETRON HCL 4 MG/2ML IJ SOLN
4.0000 mg | Freq: Four times a day (QID) | INTRAMUSCULAR | Status: DC | PRN
  Administered 2013-03-17 – 2013-03-18 (×2): 4 mg via INTRAVENOUS
  Filled 2013-03-16 (×2): qty 2

## 2013-03-16 MED ORDER — FENTANYL CITRATE 0.05 MG/ML IJ SOLN
INTRAMUSCULAR | Status: DC | PRN
  Administered 2013-03-16 (×2): 50 ug via INTRAVENOUS

## 2013-03-16 MED ORDER — HEPARIN SOD (PORK) LOCK FLUSH 100 UNIT/ML IV SOLN
500.0000 [IU] | Freq: Every day | INTRAVENOUS | Status: DC | PRN
  Filled 2013-03-16: qty 5

## 2013-03-16 MED ORDER — CEFAZOLIN SODIUM-DEXTROSE 2-3 GM-% IV SOLR
INTRAVENOUS | Status: AC
  Filled 2013-03-16: qty 50

## 2013-03-16 MED ORDER — INSULIN ASPART 100 UNIT/ML ~~LOC~~ SOLN: 0.0000 [IU] | Freq: Three times a day (TID) | SUBCUTANEOUS | Status: DC

## 2013-03-16 MED ORDER — ONDANSETRON HCL 4 MG/2ML IJ SOLN
INTRAMUSCULAR | Status: DC | PRN
  Administered 2013-03-16: 4 mg via INTRAVENOUS

## 2013-03-16 MED ORDER — ACETAMINOPHEN 650 MG RE SUPP: 650.0000 mg | Freq: Four times a day (QID) | RECTAL | Status: DC | PRN

## 2013-03-16 SURGICAL SUPPLY — 18 items
BAG URINE DRAINAGE (UROLOGICAL SUPPLIES) ×1 IMPLANT
BAG URO CATCHER STRL LF (DRAPE) ×2 IMPLANT
CATH FOLEY 2WAY SLVR  5CC 16FR (CATHETERS) ×1
CATH FOLEY 2WAY SLVR 5CC 16FR (CATHETERS) IMPLANT
CATH INTERMIT  6FR 70CM (CATHETERS) IMPLANT
CATH URET 5FR 28IN CONE TIP (BALLOONS) ×1
CATH URET 5FR 70CM CONE TIP (BALLOONS) IMPLANT
CLOTH BEACON ORANGE TIMEOUT ST (SAFETY) ×2 IMPLANT
DRAPE CAMERA CLOSED 9X96 (DRAPES) ×2 IMPLANT
GLOVE BIOGEL M 7.0 STRL (GLOVE) ×2 IMPLANT
GOWN PREVENTION PLUS LG XLONG (DISPOSABLE) ×4 IMPLANT
MANIFOLD NEPTUNE II (INSTRUMENTS) ×2 IMPLANT
MARKER SKIN DUAL TIP RULER LAB (MISCELLANEOUS) ×2 IMPLANT
NS IRRIG 1000ML POUR BTL (IV SOLUTION) ×2 IMPLANT
PACK CYSTO (CUSTOM PROCEDURE TRAY) ×2 IMPLANT
STENT POLARIS LOOP 8FR X 24 CM (STENTS) ×2 IMPLANT
TUBING CONNECTING 10 (TUBING) ×2 IMPLANT
WIRE COONS/BENSON .038X145CM (WIRE) ×2 IMPLANT

## 2013-03-16 NOTE — Anesthesia Preprocedure Evaluation (Signed)
Anesthesia Evaluation  Patient identified by MRN, date of birth, ID band Patient awake  General Assessment Comment:  Hypertension     .  Depression     .  Hydronephrosis, bilateral     .  Breast cancer metastasized to multiple sites     Reviewed: Allergy & Precautions, H&P , NPO status , Patient's Chart, lab work & pertinent test results  Airway Mallampati: II TM Distance: >3 FB Neck ROM: Full    Dental no notable dental hx.    Pulmonary neg pulmonary ROS,  breath sounds clear to auscultation  Pulmonary exam normal       Cardiovascular hypertension, negative cardio ROS  Rhythm:Regular Rate:Normal     Neuro/Psych PSYCHIATRIC DISORDERS Anxiety Depression negative neurological ROS     GI/Hepatic negative GI ROS, Neg liver ROS,   Endo/Other  negative endocrine ROS  Renal/GU Renal InsufficiencyRenal disease  negative genitourinary   Musculoskeletal negative musculoskeletal ROS (+)   Abdominal   Peds negative pediatric ROS (+)  Hematology negative hematology ROS (+)   Anesthesia Other Findings   Reproductive/Obstetrics negative OB ROS                           Anesthesia Physical Anesthesia Plan  ASA: III  Anesthesia Plan: General   Post-op Pain Management:    Induction:   Airway Management Planned: LMA  Additional Equipment:   Intra-op Plan:   Post-operative Plan:   Informed Consent:   Plan Discussed with:   Anesthesia Plan Comments:         Anesthesia Quick Evaluation

## 2013-03-16 NOTE — Transfer of Care (Signed)
Immediate Anesthesia Transfer of Care Note  Patient: Rebecca Pugh  Procedure(s) Performed: Procedure(s): CYSTOSCOPY WITH RETROGRADE PYELOGRAM/URETERAL STENT PLACEMENT (Bilateral)  Patient Location: PACU  Anesthesia Type:General  Level of Consciousness: awake, alert  and oriented  Airway & Oxygen Therapy: Patient Spontanous Breathing and Patient connected to face mask oxygen  Post-op Assessment: Report given to PACU RN and Post -op Vital signs reviewed and stable  Post vital signs: Reviewed and stable  Complications: No apparent anesthesia complications

## 2013-03-16 NOTE — H&P (Addendum)
[facility] History and Physical  Rebecca Pugh YQM:[id number] DOB: 01-28-1951 DOA: 03/15/2013  Referring physician: ER physician. PCP: [name], MD  Specialists: Dr. Lorita Officer. Oncologist.                      Dr. Julien Girt. Urologist.  Chief Complaint:  No urine output.  HPI: Rebecca Pugh is a 62 y.o. female with known history of metastatic breast carcinoma who is on chemotherapy and has had bilateral ureteral stents removed 3 days ago has had chemotherapy yesterday following which patient noticed that she is unable to urinate. Patient in the ER and labs done which showed increased creatinine for baseline and bladder scan showed no urine. Renal sonogram shows mild bilateral hydronephrosis. On-call urologist Dr. Mena Goes was consulted and they will be seeing patient in consult. Patient patient was found to be anemic and original plan by the oncologist was to transfuse packed red blood cell today. Patient otherwise denies any nausea vomiting shortness of breath chest pain abdominal pain or diarrhea. Patient has been readmitted for further management.  Review of Systems: As presented in the history of presenting illness, rest negative.  Past Medical History  Diagnosis Date  . Hypertension   . Depression   . Hydronephrosis, bilateral   . Breast cancer metastasized to multiple sites POSITIVE METS IN 2012-----  ONCOLOGIST--  DR Darnelle Catalan--  CURRENTLY RECEIVING CHEMO  EVERY 3 WEEKS (STARTED 06-15-2012)    FIRST DX 01-15-2006, INVASIVE DUCTAL GRADE III/    S/P LEFT MASTECTOMY AND CHEMORADIATION (CHEMO COMPLETE NOV 2007 AND XRT COMPLETE FEB 2008)   Past Surgical History  Procedure Laterality Date  . Orif ankle fracture  01/31/2012    Procedure: OPEN REDUCTION INTERNAL FIXATION (ORIF) ANKLE FRACTURE;  Surgeon: Loanne Drilling, MD;  Location: [facility];  Service: Orthopedics;  Laterality: Right;  . Port-a-cath placement  12-05-2010  . Mastectomy modified radical Left 01-15-2006    W/ LYMPH NODE  DISSECTIONS AND PORT-A-CATH PLACEMENT (REMOVAL 04-06-2007)  . Transthoracic echocardiogram  09-08-2012    MILD LVF/  NORMAL LVSF/ EF 60-65%  . Cholecystectomy  1977    AND APPENDECTOMY  . Tonsillectomy  AS CHILD  . Cystoscopy w/ ureteral stent placement Bilateral 09/11/2012    Procedure: CYSTOSCOPY WITH RETROGRADE PYELOGRAM/URETERAL STENT PLACEMENT;  Surgeon: Lindaann Slough, MD;  Location: [facility];  Service: Urology;  Laterality: Bilateral;  1 HR   . Transthoracic echocardiogram  09-08-2012    mild lvf/ lvsf normal/ ef 60-65%  . Cystoscopy with stent placement N/A 10/20/2012    Procedure: CYSTOSCOPY, bilateral retrogrades WITH bilateral  STENT RE PLACEMENTs;  Surgeon: Lindaann Slough, MD;  Location: [facility] [place];  Service: Urology;  Laterality: N/A;  BILATERAL JJ STENT PLACEMENT   . Cystoscopy w/ ureteral stent placement Bilateral 01/15/2013    Procedure: CYSTOSCOPY WITH STENT REPLACEMENT;  Surgeon: Lindaann Slough, MD;  Location: [facility];  Service: Urology;  Laterality: Bilateral;  . Stent removal Bilateral 03/12/2013    Ureteral stent  . Appendectomy    . Skin biopsy      Simple keratoses   Social History:  reports that she quit smoking about 18 years ago. She has never used smokeless tobacco. She reports that  drinks alcohol. She reports that she does not use illicit drugs. Home. where does patient live-- Yes. Can patient participate in ADLs?  No Known Allergies  Family History  Problem Relation Age of Onset  . COPD Mother   . Cancer Mother  Bladder cancer  . Healthy Sister       Prior to Admission medications   Medication Sig Start Date End Date Taking? Authorizing Provider  calcium citrate (CALCITRATE - DOSED IN MG ELEMENTAL CALCIUM) 950 MG tablet Take 1 tablet by mouth daily.    Yes Historical Provider, MD  FLUoxetine (PROZAC) 40 MG capsule TAKE ONE CAPSULE BY MOUTH ONCE DAILY 11/15/12  Yes [name] Allegra Grana, PA-C  gabapentin  (NEURONTIN) 300 MG capsule Take 1 capsule (300 mg total) by mouth at bedtime. 11/04/12  Yes [name] Allegra Grana, PA-C  lapatinib (TYKERB) 250 MG tablet Take 4 tablets (1,000 mg total) by mouth daily. 02/16/13  Yes Lowella Dell, MD  letrozole [facility]) 2.5 MG tablet Take 2.5 mg by mouth daily.  11/30/12  Yes Historical Provider, MD  lidocaine-prilocaine (EMLA) cream Apply topically as needed. 06/12/12  Yes [name] Allegra Grana, PA-C  loperamide (IMODIUM) 2 MG capsule Take 2 mg by mouth 4 (four) times daily as needed. diarrhea   Yes Historical Provider, MD  LORazepam (ATIVAN) 0.5 MG tablet Take 1 tablet (0.5 mg total) by mouth 2 (two) times daily as needed. 07/27/12  Yes [name] Allegra Grana, PA-C  metoCLOPramide (REGLAN) 10 MG tablet Take 0.5 tablets (5 mg total) by mouth 4 (four) times daily -  before meals and at bedtime. 11/30/12  Yes Lowella Dell, MD  omeprazole (PRILOSEC) 40 MG capsule Take 1 capsule (40 mg total) by mouth at bedtime. 11/30/12  Yes Lowella Dell, MD  ondansetron (ZOFRAN) 8 MG tablet Take 8 mg by mouth 2 (two) times daily as needed for nausea. 06/12/12  Yes [name] Allegra Grana, PA-C  oxyCODONE (OXY IR/ROXICODONE) 5 MG immediate release tablet Take 1 tablet (5 mg total) by mouth every 6 (six) hours as needed. 03/15/13  Yes Keitha Butte, NP  predniSONE (DELTASONE) 5 MG tablet TAKE ONE TABLET BY MOUTH ONCE DAILY.  TAKE FOR 4 DAYS AFTER DISCONTINUING DEXAMETHASONE. 03/06/13  Yes [name] Allegra Grana, PA-C  prochlorperazine (COMPAZINE) 10 MG tablet Take 1 tablet (10 mg total) by mouth every 6 (six) hours as needed. 06/12/12  Yes [name] Allegra Grana, PA-C  cholecalciferol (VITAMIN D) 1000 UNITS tablet Take 3,000 Units by mouth daily.     Historical Provider, MD   Physical Exam: Filed Vitals:   03/15/13 2032 03/16/13 0040 03/16/13 0150  BP: 139/55 139/75 135/66  Pulse: 83 77 77  Temp: 98.9 F (37.2 C) 99 F (37.2 C) 98.3 F (36.8 C)  TempSrc: Oral Oral Oral  Resp: 20 18   Height: 5\' 4"  (1.626 m)    Weight: 58.06 kg  (128 lb)    SpO2: 98% 98% 96%     General:  Well-developed well-nourished.  Eyes: Anicteric no pallor.  ENT: No discharge from the ears eyes nose mouth.  Neck: No mass felt.  Cardiovascular:  S1-S2 heard.  Respiratory: No rhonchi or crepitations.  Abdomen: Soft nontender bowel sounds present.  Skin: No rash.  Musculoskeletal: No edema.  Psychiatric: Appears normal.  Neurologic: E oriented to time place and person. Moves all extremities.  Labs on Admission:  Basic Metabolic Panel:  Recent Labs Lab 03/15/13 0911 03/15/13 2145  NA 140 134*  K 4.0 5.2*  CL  --  100  CO2 23 22  GLUCOSE 114 213*  BUN 21.8 33*  CREATININE 2.8* 3.96*  CALCIUM 8.5 8.0*   Liver Function Tests:  Recent Labs Lab 03/15/13 0911  AST 20  ALT 13  ALKPHOS 95  BILITOT 0.78  PROT 5.9*  ALBUMIN 2.8*   No results found for this basename: LIPASE, AMYLASE,  in the last 168 hours No results found for this basename: AMMONIA,  in the last 168 hours CBC:  Recent Labs Lab 03/15/13 0910 03/15/13 2145  WBC 2.6* 2.5*  NEUTROABS 1.6 2.1  HGB 8.0* 7.1*  HCT 24.2* 21.2*  MCV 93.8 92.6  PLT 170 154   Cardiac Enzymes: No results found for this basename: CKTOTAL, CKMB, CKMBINDEX, TROPONINI,  in the last 168 hours  BNP (last 3 results) No results found for this basename: PROBNP,  in the last 8760 hours CBG: No results found for this basename: GLUCAP,  in the last 168 hours  Radiological Exams on Admission: US Renal  03/15/2013   CLINICAL DATA:  Urinary retention.  EXAM: RENAL/URINARY TRACT ULTRASOUND COMPLETE  COMPARISON:  CT 02/11/2013  FINDINGS: Right Kidney  Length: 10.7 cm. Mild right hydronephrosis. No focal abnormality. Normal echotexture.  Left Kidney  Length: 11.1 cm. Slight hydronephrosis. No focal abnormality. Normal echotexture.  Bladder:  Bladder is not visualized, decompressed.  IMPRESSION: Mild hydronephrosis bilaterally, right slightly greater than left. Nonvisualization of  the bladder which is decompressed.   Electronically Signed   By: Charlett Nose M.D.   On: 03/15/2013 23:47     Assessment/Plan Principal Problem:   ARF (acute renal failure) Active Problems:   Breast cancer metastasized to multiple sites   Urinary obstruction   Anemia   1. Acute renal failure from obstructive uropathy - urologist Dr. Mena Goes to see patient. In anticipation of possible placement of stents to relieve the obstruction patient will be placed n.p.o. Patient is also receiving packed red blood cells for her anemia which will be cautiously given. With close followup of patient's respiratory status. I think patient's creatinine will improve with relief of obstruction. Closely follow metabolic panel as patient also has mild hypokalemia. Check phosphorus levels and uric acid levels. 2. Anemia - probably chemotherapy related. Patient is receiving 2 units of packed or blood cells. Further IV fluids hour infusion will be based on patient's urine output. Closely monitor respiratory status. 3. Hyperglycemia - probably related to steroids. Check hemoglobin A1c. Place patient on sliding-scale insulin. 4. Metastatic breast carcinoma - per oncologist. 5. History of hypertension and LV dysfunction - presently patient's blood pressure is in the low-normal. Patient is only on when necessary IV hydralazine for systolic blood pressure more than 160.    Code Status: Full code.  Family Communication: None.  Disposition Plan: Admit to inpatient.    Adrieana Fennelly N. [facility] Pager [phone].  If 7PM-7AM, please contact night-coverage [email or web address] Password [email or web address] 03/16/2013, 2:24 AM

## 2013-03-16 NOTE — Progress Notes (Signed)
Patient admitted earlier this am after inability to void after her urethral stents were removed. She has a h/o metastatic breast cancer. Renal US this admission showed mild hydronephrosis and an empty bladder. Urology, Dr. Brunilda Payor, was consulted. She was taken to the OR today with plans for a cystoscopy, bilateral retrograde pyelograms and double-J stents. Follow renal function once stents in place. Will continue to monitor.  Peggye Pitt, MD [facility] Pager: [phone]

## 2013-03-16 NOTE — Anesthesia Postprocedure Evaluation (Signed)
  Anesthesia Post-op Note  Patient: Rebecca Pugh  Procedure(s) Performed: Procedure(s) (LRB): CYSTOSCOPY WITH RETROGRADE PYELOGRAM/URETERAL STENT PLACEMENT (Bilateral)  Patient Location: PACU  Anesthesia Type: General  Level of Consciousness: awake and alert   Airway and Oxygen Therapy: Patient Spontanous Breathing  Post-op Pain: mild  Post-op Assessment: Post-op Vital signs reviewed, Patient's Cardiovascular Status Stable, Respiratory Function Stable, Patent Airway and No signs of Nausea or vomiting  Last Vitals:  Filed Vitals:   03/16/13 1412  BP: 109/51  Pulse: 70  Temp: 36.5 C  Resp: 14    Post-op Vital Signs: stable   Complications: No apparent anesthesia complications

## 2013-03-16 NOTE — Progress Notes (Signed)
INITIAL NUTRITION ASSESSMENT  DOCUMENTATION CODES Per approved criteria  -Severe malnutrition in the context of chronic illness   INTERVENTION: Diet advancement per team to least restrictive diet. RD to continue to follow nutrition care plan, will assess need for further interventions at follow-up.  NUTRITION DIAGNOSIS: Increased nutrient needs related to catabolic illness as evidenced by estimated needs.   Goal: Diet advancement; Intake to meet >90% of estimated nutrition needs.  Monitor:  weight trends, lab trends, I/O's, diet advancement  Reason for Assessment: Malnutrition Screening Tool  62 y.o. female  Admitting Dx: ARF (acute renal failure)  ASSESSMENT: PMHx significant for metastatic breast CA, currently on chemo. Recent removal of bilateral ureteral stents 3 days ago. Admitted with inability to urinate. Work-up reveals mild bilateral hydronephrosis.  Pt is currently NPO in anticipation of possible stent placement to relieve obstruction. Receiving PRBC for anemia.  Pt with 18% wt loss of usual body weight within the past 6 months - this is significant for time frame. Pt confirms that her appetite has been poor for "months" but most recently, pt has been eating well, per her report. Pt does not take any nutritional supplements, such as Ensure or Boost. Pt states that she feels better and is hungry/ready to eat. Family at bedside report that her intake is not as good as she states that it is, but confirms that it is improving. Pt denies any questions/concerns re: nutrition at this time.  Pt meets criteria for severe MALNUTRITION in the context of chronic illness as evidenced by 18% wt loss x 6 months and intake of <75% x at least 1 month.  Height: Ht Readings from Last 1 Encounters:  03/15/13 5\' 4"  (1.626 m)    Weight: Wt Readings from Last 1 Encounters:  03/15/13 128 lb (58.06 kg)    Ideal Body Weight: 120 lb  % Ideal Body Weight: 107%  Wt Readings from Last 30  Encounters:  03/15/13 128 lb (58.06 kg)  03/15/13 128 lb (58.06 kg)  03/15/13 128 lb 3.2 oz (58.151 kg)  03/01/13 119 lb 11.2 oz (54.296 kg)  02/22/13 125 lb 9.6 oz (56.972 kg)  02/16/13 126 lb (57.153 kg)  02/08/13 125 lb 3 oz (56.785 kg)  02/01/13 130 lb 8 oz (59.194 kg)  01/15/13 129 lb (58.514 kg)  01/15/13 129 lb (58.514 kg)  01/11/13 128 lb 3.2 oz (58.151 kg)  01/07/13 126 lb 12.8 oz (57.516 kg)  12/28/12 126 lb 12.8 oz (57.516 kg)  12/17/12 130 lb 1.6 oz (59.013 kg)  12/07/12 130 lb 12.8 oz (59.33 kg)  11/30/12 132 lb (59.875 kg)  11/04/12 138 lb 9.6 oz (62.869 kg)  10/20/12 155 lb (70.308 kg)  10/20/12 155 lb (70.308 kg)  10/19/12 149 lb 1.6 oz (67.631 kg)  10/05/12 147 lb (66.679 kg)  09/28/12 153 lb (69.4 kg)  09/16/12 150 lb 6.4 oz (68.221 kg)  09/11/12 156 lb (70.761 kg)  09/11/12 156 lb (70.761 kg)  09/08/12 157 lb (71.215 kg)  09/07/12 156 lb 6.4 oz (70.943 kg)  08/24/12 157 lb 8 oz (71.442 kg)  08/17/12 161 lb 4.8 oz (73.165 kg)  07/27/12 165 lb (74.844 kg)   Usual Body Weight: 156 lb (6 months ago)  % Usual Body Weight: 82%  BMI:  Body mass index is 21.96 kg/(m^2). WNL  Estimated Nutritional Needs: Kcal: 1750 - 2000 Protein: 75 - 90 g Fluid: 1.8 - 2 liters  Skin: intact  Diet Order: NPO  EDUCATION NEEDS: -No education needs identified  at this time   Intake/Output Summary (Last 24 hours) at 03/16/13 1021 Last data filed at 03/16/13 0730  Gross per 24 hour  Intake 343.75 ml  Output      5 ml  Net 338.75 ml    Last BM: 9/28  Labs:   Recent Labs Lab 03/15/13 0911 03/15/13 2145 03/16/13 0909  NA 140 134* 136  K 4.0 5.2* 4.6  CL  --  100 103  CO2 23 22 21   BUN 21.8 33* 40*  CREATININE 2.8* 3.96* 4.73*  CALCIUM 8.5 8.0* 8.1*  PHOS  --   --  4.3  GLUCOSE 114 213* 118*    CBG (last 3)   Recent Labs  03/16/13 0553  GLUCAP 128*    Scheduled Meds: . insulin aspart  0-9 Units Subcutaneous TID WC  . sodium chloride  3 mL  Intravenous Q12H    Continuous Infusions: . sodium chloride      Past Medical History  Diagnosis Date  . Hypertension   . Depression   . Hydronephrosis, bilateral   . Breast cancer metastasized to multiple sites POSITIVE METS IN 2012-----  ONCOLOGIST--  DR Darnelle Catalan--  CURRENTLY RECEIVING CHEMO  EVERY 3 WEEKS (STARTED 06-15-2012)    FIRST DX 01-15-2006, INVASIVE DUCTAL GRADE III/    S/P LEFT MASTECTOMY AND CHEMORADIATION (CHEMO COMPLETE NOV 2007 AND XRT COMPLETE FEB 2008)    Past Surgical History  Procedure Laterality Date  . Orif ankle fracture  01/31/2012    Procedure: OPEN REDUCTION INTERNAL FIXATION (ORIF) ANKLE FRACTURE;  Surgeon: Loanne Drilling, MD;  Location: [facility];  Service: Orthopedics;  Laterality: Right;  . Port-a-cath placement  12-05-2010  . Mastectomy modified radical Left 01-15-2006    W/ LYMPH NODE DISSECTIONS AND PORT-A-CATH PLACEMENT (REMOVAL 04-06-2007)  . Transthoracic echocardiogram  09-08-2012    MILD LVF/  NORMAL LVSF/ EF 60-65%  . Cholecystectomy  1977    AND APPENDECTOMY  . Tonsillectomy  AS CHILD  . Cystoscopy w/ ureteral stent placement Bilateral 09/11/2012    Procedure: CYSTOSCOPY WITH RETROGRADE PYELOGRAM/URETERAL STENT PLACEMENT;  Surgeon: Lindaann Slough, MD;  Location: [facility];  Service: Urology;  Laterality: Bilateral;  1 HR   . Transthoracic echocardiogram  09-08-2012    mild lvf/ lvsf normal/ ef 60-65%  . Cystoscopy with stent placement N/A 10/20/2012    Procedure: CYSTOSCOPY, bilateral retrogrades WITH bilateral  STENT RE PLACEMENTs;  Surgeon: Lindaann Slough, MD;  Location: [facility];  Service: Urology;  Laterality: N/A;  BILATERAL JJ STENT PLACEMENT   . Cystoscopy w/ ureteral stent placement Bilateral 01/15/2013    Procedure: CYSTOSCOPY WITH STENT REPLACEMENT;  Surgeon: Lindaann Slough, MD;  Location: [facility] ;  Service: Urology;  Laterality: Bilateral;  . Stent removal Bilateral 03/12/2013     Ureteral stent  . Appendectomy    . Skin biopsy      Simple keratoses    Jarold Motto MS, RD, LDN Pager: [phone] After-hours pager: [phone]

## 2013-03-16 NOTE — Consult Note (Signed)
Urology Consult  Referring physician: Dr. Midge Minium Reason for referral: Renal insufficiency  Chief Complaint: No urinary output  History of Present Illness: Patient is a 62 years old female with metastatic breast cancer. She has bilateral hydronephrosis and had bilateral double-J stents. She is a modest by [name] with carboplatin and Gemzar. CT scan on 02/11/2013 showed improvement in the size of the retroperitoneal nodes.  She was miserable with the stents and wanted them out. Since she was so miserable with the stents [name] and I thought that it was okay to give it a trial and see if she could do without the stents. That was done 4 days ago with the understanding that we may have to replace the stents if her creatinine starts to rise. Her creatinine was 2.8 yesterday. She went home and was unable to void. She returned to the emergency room and her creatinine was up to 3.9. Renal ultrasound showed  mild hydronephrosis and an empty bladder.   Past Medical History  Diagnosis Date  . Hypertension   . Depression   . Hydronephrosis, bilateral   . Breast cancer metastasized to multiple sites POSITIVE METS IN 2012-----  ONCOLOGIST--  DR Darnelle Catalan--  CURRENTLY RECEIVING CHEMO  EVERY 3 WEEKS (STARTED 06-15-2012)    FIRST DX 01-15-2006, INVASIVE DUCTAL GRADE III/    S/P LEFT MASTECTOMY AND CHEMORADIATION (CHEMO COMPLETE NOV 2007 AND XRT COMPLETE FEB 2008)   Past Surgical History  Procedure Laterality Date  . Orif ankle fracture  01/31/2012    Procedure: OPEN REDUCTION INTERNAL FIXATION (ORIF) ANKLE FRACTURE;  Surgeon: Loanne Drilling, MD;  Location: [facility];  Service: Orthopedics;  Laterality: Right;  . Port-a-cath placement  12-05-2010  . Mastectomy modified radical Left 01-15-2006    W/ LYMPH NODE DISSECTIONS AND PORT-A-CATH PLACEMENT (REMOVAL 04-06-2007)  . Transthoracic echocardiogram  09-08-2012    MILD LVF/  NORMAL LVSF/ EF 60-65%  . Cholecystectomy  1977    AND APPENDECTOMY   . Tonsillectomy  AS CHILD  . Cystoscopy w/ ureteral stent placement Bilateral 09/11/2012    Procedure: CYSTOSCOPY WITH RETROGRADE PYELOGRAM/URETERAL STENT PLACEMENT;  Surgeon: Lindaann Slough, MD;  Location: [facility] [place];  Service: Urology;  Laterality: Bilateral;  1 HR   . Transthoracic echocardiogram  09-08-2012    mild lvf/ lvsf normal/ ef 60-65%  . Cystoscopy with stent placement N/A 10/20/2012    Procedure: CYSTOSCOPY, bilateral retrogrades WITH bilateral  STENT RE PLACEMENTs;  Surgeon: Lindaann Slough, MD;  Location: [facility] [place];  Service: Urology;  Laterality: N/A;  BILATERAL JJ STENT PLACEMENT   . Cystoscopy w/ ureteral stent placement Bilateral 01/15/2013    Procedure: CYSTOSCOPY WITH STENT REPLACEMENT;  Surgeon: Lindaann Slough, MD;  Location: [facility] [place];  Service: Urology;  Laterality: Bilateral;  . Stent removal Bilateral 03/12/2013    Ureteral stent  . Appendectomy    . Skin biopsy      Simple keratoses    Medications: Hydralazine, carboplatin, Gemzar Allergies: No Known Allergies  Family History  Problem Relation Age of Onset  . COPD Mother   . Cancer Mother     Bladder cancer  . Healthy Sister    Social History:  reports that she quit smoking about 18 years ago. She has never used smokeless tobacco. She reports that  drinks alcohol. She reports that she does not use illicit drugs.  ROS: All systems are reviewed and negative except as noted.   Physical Exam:  Vital signs in last  24 hours: Temp:  [98 F (36.7 C)-99 F (37.2 C)] 98.1 F (36.7 C) (09/30 0738) Pulse Rate:  [68-88] 88 (09/30 0738) Resp:  [18-20] 18 (09/30 0613) BP: (128-150)/(55-89) 150/67 mmHg (09/30 0738) SpO2:  [96 %-100 %] 99 % (09/30 0738) Weight:  [58.06 kg (128 lb)] 58.06 kg (128 lb) (09/29 2032)  Cardiovascular: Skin warm; not flushed Respiratory: Breaths quiet; no shortness of breath Abdomen: No masses Neurological: Normal sensation to  touch Musculoskeletal: Normal motor function arms and legs Lymphatics: No inguinal adenopathy Skin: No rashes Genitourinary: Normal female genitalia  Laboratory Data:  Results for orders placed during the hospital encounter of 03/15/13 (from the past 72 hour(s))  CBC WITH DIFFERENTIAL     Status: Abnormal   Collection Time    03/15/13  9:45 PM      Result Value Range   WBC 2.5 (*) 4.0 - 10.5 K/uL   RBC 2.29 (*) 3.87 - 5.11 MIL/uL   Hemoglobin 7.1 (*) 12.0 - 15.0 g/dL   HCT 19.1 (*) 47.8 - 29.5 %   MCV 92.6  78.0 - 100.0 fL   MCH 31.0  26.0 - 34.0 pg   MCHC 33.5  30.0 - 36.0 g/dL   RDW 62.1 (*) 30.8 - 65.7 %   Platelets 154  150 - 400 K/uL   Neutrophils Relative % 84 (*) 43 - 77 %   Neutro Abs 2.1  1.7 - 7.7 K/uL   Lymphocytes Relative 5 (*) 12 - 46 %   Lymphs Abs 0.1 (*) 0.7 - 4.0 K/uL   Monocytes Relative 10  3 - 12 %   Monocytes Absolute 0.3  0.1 - 1.0 K/uL   Eosinophils Relative 0  0 - 5 %   Eosinophils Absolute 0.0  0.0 - 0.7 K/uL   Basophils Relative 0  0 - 1 %   Basophils Absolute 0.0  0.0 - 0.1 K/uL  BASIC METABOLIC PANEL     Status: Abnormal   Collection Time    03/15/13  9:45 PM      Result Value Range   Sodium 134 (*) 135 - 145 mEq/L   Potassium 5.2 (*) 3.5 - 5.1 mEq/L   Chloride 100  96 - 112 mEq/L   CO2 22  19 - 32 mEq/L   Glucose, Bld 213 (*) 70 - 99 mg/dL   BUN 33 (*) 6 - 23 mg/dL   Creatinine, Ser 8.46 (*) 0.50 - 1.10 mg/dL   Calcium 8.0 (*) 8.4 - 10.5 mg/dL   GFR calc non Af Amer 11 (*) >90 mL/min   GFR calc Af Amer 13 (*) >90 mL/min   Comment: (NOTE)     The eGFR has been calculated using the CKD EPI equation.     This calculation has not been validated in all clinical situations.     eGFR's persistently <90 mL/min signify possible Chronic Kidney     Disease.  PREPARE RBC (CROSSMATCH)     Status: None   Collection Time    03/15/13 11:15 PM      Result Value Range   Order Confirmation ORDER PROCESSED BY BLOOD BANK    MRSA PCR SCREENING      Status: None   Collection Time    03/16/13 12:51 AM      Result Value Range   MRSA by PCR NEGATIVE  NEGATIVE   Comment:            The GeneXpert MRSA Assay (FDA     approved  for NASAL specimens     only), is one component of a     comprehensive MRSA colonization     surveillance program. It is not     intended to diagnose MRSA     infection nor to guide or     monitor treatment for     MRSA infections.     Performed at [facility]  GLUCOSE, CAPILLARY     Status: Abnormal   Collection Time    03/16/13  5:53 AM      Result Value Range   Glucose-Capillary 128 (*) 70 - 99 mg/dL  BASIC METABOLIC PANEL     Status: Abnormal   Collection Time    03/16/13  9:09 AM      Result Value Range   Sodium 136  135 - 145 mEq/L   Potassium 4.6  3.5 - 5.1 mEq/L   Chloride 103  96 - 112 mEq/L   CO2 21  19 - 32 mEq/L   Glucose, Bld 118 (*) 70 - 99 mg/dL   BUN 40 (*) 6 - 23 mg/dL   Creatinine, Ser 2.44 (*) 0.50 - 1.10 mg/dL   Calcium 8.1 (*) 8.4 - 10.5 mg/dL   GFR calc non Af Amer 9 (*) >90 mL/min   GFR calc Af Amer 11 (*) >90 mL/min   Comment: (NOTE)     The eGFR has been calculated using the CKD EPI equation.     This calculation has not been validated in all clinical situations.     eGFR's persistently <90 mL/min signify possible Chronic Kidney     Disease.  CBC     Status: Abnormal   Collection Time    03/16/13  9:09 AM      Result Value Range   WBC 3.7 (*) 4.0 - 10.5 K/uL   RBC 2.98 (*) 3.87 - 5.11 MIL/uL   Hemoglobin 9.0 (*) 12.0 - 15.0 g/dL   Comment: DELTA CHECK NOTED     POST TRANSFUSION SPECIMEN   HCT 26.5 (*) 36.0 - 46.0 %   MCV 88.9  78.0 - 100.0 fL   MCH 30.2  26.0 - 34.0 pg   MCHC 34.0  30.0 - 36.0 g/dL   RDW 01.0 (*) 27.2 - 53.6 %   Platelets 177  150 - 400 K/uL  PHOSPHORUS     Status: None   Collection Time    03/16/13  9:09 AM      Result Value Range   Phosphorus 4.3  2.3 - 4.6 mg/dL  URIC ACID     Status: Abnormal   Collection Time    03/16/13  9:09 AM       Result Value Range   Uric Acid, Serum 10.0 (*) 2.4 - 7.0 mg/dL  GLUCOSE, CAPILLARY     Status: Abnormal   Collection Time    03/16/13 10:54 AM      Result Value Range   Glucose-Capillary 119 (*) 70 - 99 mg/dL   Recent Results (from the past 240 hour(s))  MRSA PCR SCREENING     Status: None   Collection Time    03/16/13 12:51 AM      Result Value Range Status   MRSA by PCR NEGATIVE  NEGATIVE Final   Comment:            The GeneXpert MRSA Assay (FDA     approved for NASAL specimens     only), is one component of a     comprehensive  MRSA colonization     surveillance program. It is not     intended to diagnose MRSA     infection nor to guide or     monitor treatment for     MRSA infections.     Performed at [facility]   Creatinine:  Recent Labs  03/15/13 0911 03/15/13 2145 03/16/13 0909  CREATININE 2.8* 3.96* 4.73*    Xrays: See report/chart   Impression/Assessment:  Renal insufficiency. Bilateral hydronephrosis secondary to metastatic breast cancer  Plan:  Cystoscopy, bilateral retrograde pyelograms, insertion of bilateral double-J stents  Rebecca Pugh 03/16/2013, 11:51 AM   CC: Dr. Midge Minium

## 2013-03-16 NOTE — Care Management Note (Signed)
    Page 1 of 1   03/16/2013     3:29:56 PM   CARE MANAGEMENT NOTE 03/16/2013  Patient:  Rebecca Pugh, Rebecca Pugh   Account Number:  [id number]  Date Initiated:  03/16/2013  Documentation initiated by:  Lanier Clam  Subjective/Objective Assessment:   62 Y/O F ADMITTED W/ACUTE RENAL FAILURE.     Action/Plan:   FROM HOME.HAS PCP,PHARMACY.   Anticipated DC Date:  03/19/2013   Anticipated DC Plan:  HOME/SELF CARE      DC Planning Services  CM consult      Choice offered to / List presented to:             Status of service:  In process, will continue to follow Medicare Important Message given?   (If response is "NO", the following Medicare IM given date fields will be blank) Date Medicare IM given:   Date Additional Medicare IM given:    Discharge Disposition:    Per UR Regulation:  Reviewed for med. necessity/level of care/duration of stay  If discussed at Long Length of Stay Meetings, dates discussed:    Comments:  03/16/13 Alayha Babineaux RN,BSN NCM [phone] S/P CYSTOSCOPY,&  DOUBLE J STENT.

## 2013-03-16 NOTE — ED Provider Notes (Signed)
Medical screening examination/treatment/procedure(s) were performed by non-physician practitioner and as supervising physician I was immediately available for consultation/collaboration.  Alayzia Pavlock K Linker, MD 03/16/13 1626 

## 2013-03-16 NOTE — Op Note (Signed)
Rebecca Pugh is a 62 y.o.   03/16/2013  General  Pre-op diagnosis: Renal insufficiency, bilateral hydronephrosis, metastatic breast cancer.  Postop diagnosis same  Procedure done: Cystoscopy, bilateral retrograde pyelograms. Insertion of bilateral double-J stents  Surgeon: Wendie Simmer. [name]  Anesthesia: Gen.  Indication: Patient is a 62 years old female with metastatic breast cancer. She had bilateral double-J stents for bilateral hydronephrosis. She was miserable with the stents. CT scan a month ago showed improvement of retroperitoneal adenopathy. She wanted the stents out. She understood that if we remove the stents we need then to monitor her BUN and creatinine and they would start to rise she would need stents again. Her baseline was creatinine 1.5. The stents were removed on 9/26. She did well for 2 days. Creatinine was 2.8 yesterday morning. She went home and only passed a small amount of urine. She returned to the emergency room last night and her creatinine was 3.9. Renal ultrasound showed mild hydronephrosis and an empty bladder. She is scheduled now for cystoscopy and double-J stents insertion.  Procedure: The patient was identified by her wrist band and proper timeout was taken.  Under general anesthesia she was prepped and draped and placed in the dorsolithotomy position. A panendoscope was inserted in the bladder. There is some edema around the ureteral orifices. There is no stone or tumor in the bladder.  Right retrograde pyelogram:  A cone-tip catheter was passed through the cystoscope and the right ureteral orifice. Contrast was then injected through the cone-tip catheter. The distal and mid ureter appear normal. There is an area of narrowing of the proximal ureter that measures about 2 cm. The ureter proximal to that area and the renal pelvis are mildly dilated. The cone-tip catheter was removed.  A sensor wire was passed through the cystoscope and the right ureter. A #8  French-24 Polaris stent was passed over the sensor wire. The sensor wire was then removed. The proximal curl of the double-J stent is in the renal pelvis. The loops of the Polaris stent are in the bladder.  Left retrograde pyelogram:  The cone-tip catheter was passed through the cystoscope and the left ureteral orifice. Contrast was then injected through the cone-tip catheter. Again the mid and distal ureter are normal. There is an area of narrowing of the proximal ureter. The renal pelvis and calyces are moderately dilated. The cone-tip catheter was then removed.  A sensor wire was passed and through the cystoscope and the left ureter. A #8 French-24 Polaris stent was passed over the sensor wire. The sensor wire was then removed. The proximal curl of the double-J stent is in the collecting system. The loops of the double-J stent in the bladder. The bladder was then emptied and the cystoscope removed.  The patient tolerated the procedure well and left the OR in satisfactory condition to postanesthesia care unit.  Cc: Dr. Ruthann Cancer

## 2013-03-17 ENCOUNTER — Encounter (HOSPITAL_COMMUNITY): Payer: Self-pay | Admitting: Urology

## 2013-03-17 DIAGNOSIS — F411 Generalized anxiety disorder: Secondary | ICD-10-CM

## 2013-03-17 DIAGNOSIS — D630 Anemia in neoplastic disease: Secondary | ICD-10-CM

## 2013-03-17 LAB — BASIC METABOLIC PANEL
CO2: 23 mEq/L (ref 19–32)
Chloride: 106 mEq/L (ref 96–112)
GFR calc non Af Amer: 16 mL/min — ABNORMAL LOW (ref >90)
Glucose, Bld: 100 mg/dL — ABNORMAL HIGH (ref 70–99)
Potassium: 4.3 mEq/L (ref 3.5–5.1)
Sodium: 139 mEq/L (ref 135–145)

## 2013-03-17 LAB — CBC
HCT: 24.4 % — ABNORMAL LOW (ref 36.0–46.0)
Hemoglobin: 8.3 g/dL — ABNORMAL LOW (ref 12.0–15.0)
MCH: 30.9 pg (ref 26.0–34.0)
RBC: 2.69 MIL/uL — ABNORMAL LOW (ref 3.87–5.11)

## 2013-03-17 LAB — GLUCOSE, CAPILLARY
Glucose-Capillary: 107 mg/dL — ABNORMAL HIGH (ref 70–99)
Glucose-Capillary: 124 mg/dL — ABNORMAL HIGH (ref 70–99)
Glucose-Capillary: 94 mg/dL (ref 70–99)
Glucose-Capillary: 121 mg/dL — ABNORMAL HIGH (ref 70–99)
Glucose-Capillary: 112 mg/dL — ABNORMAL HIGH (ref 70–99)

## 2013-03-17 MED ORDER — MIRABEGRON ER 25 MG PO TB24
25.0000 mg | ORAL_TABLET | Freq: Every day | ORAL | Status: DC
  Administered 2013-03-17 – 2013-03-18 (×2): 25 mg via ORAL
  Filled 2013-03-17 (×2): qty 1

## 2013-03-17 MED ORDER — LORAZEPAM 0.5 MG PO TABS
0.5000 mg | ORAL_TABLET | Freq: Two times a day (BID) | ORAL | Status: DC | PRN
  Administered 2013-03-17: 0.5 mg via ORAL
  Filled 2013-03-17: qty 1

## 2013-03-17 MED ORDER — LAPATINIB DITOSYLATE 250 MG PO TABS
1000.0000 mg | ORAL_TABLET | Freq: Every day | ORAL | Status: DC
  Administered 2013-03-17: 1000 mg via ORAL
  Filled 2013-03-17: qty 4

## 2013-03-17 MED ORDER — FLUOXETINE HCL 20 MG PO CAPS
40.0000 mg | ORAL_CAPSULE | Freq: Every day | ORAL | Status: DC
  Administered 2013-03-17: 40 mg via ORAL
  Filled 2013-03-17 (×2): qty 2

## 2013-03-17 NOTE — Progress Notes (Signed)
1 Day Post-Op Subjective: Patient reports Suprapubic pain. Bladder spasms  Objective: Vital signs in last 24 hours: Temp:  [97.7 F (36.5 C)-100.3 F (37.9 C)] 100.3 F (37.9 C) (10/01 0532) Pulse Rate:  [69-89] 89 (10/01 0532) Resp:  [10-19] 19 (10/01 0532) BP: (101-145)/(51-63) 145/63 mmHg (10/01 0532) SpO2:  [100 %] 100 % (10/01 0532)  Intake/Output from previous day: 09/30 0701 - 10/01 0700 In: 990 [P.O.:240; I.V.:750] Out: 1395 [Urine:1395] Intake/Output this shift:    Physical Exam:  General: Foley draining well Urinary output: 1395 cc Creatinine down to 2.95 from 4.73   Lab Results:  Recent Labs  03/15/13 2145 03/16/13 0909 03/17/13 0450  HGB 7.1* 9.0* 8.3*  HCT 21.2* 26.5* 24.4*   BMET  Recent Labs  03/16/13 0909 03/17/13 0450  NA 136 139  K 4.6 4.3  CL 103 106  CO2 21 23  GLUCOSE 118* 100*  BUN 40* 33*  CREATININE 4.73* 2.95*  CALCIUM 8.1* 7.9*   No results found for this basename: LABPT, INR,  in the last 72 hours No results found for this basename: LABURIN,  in the last 72 hours Results for orders placed during the hospital encounter of 03/15/13  MRSA PCR SCREENING     Status: None   Collection Time    03/16/13 12:51 AM      Result Value Range Status   MRSA by PCR NEGATIVE  NEGATIVE Final   Comment:            The GeneXpert MRSA Assay (FDA     approved for NASAL specimens     only), is one component of a     comprehensive MRSA colonization     surveillance program. It is not     intended to diagnose MRSA     infection nor to guide or     monitor treatment for     MRSA infections.     Performed at [facility]    Studies/Results: US Renal  03/15/2013   CLINICAL DATA:  Urinary retention.  EXAM: RENAL/URINARY TRACT ULTRASOUND COMPLETE  COMPARISON:  CT 02/11/2013  FINDINGS: Right Kidney  Length: 10.7 cm. Mild right hydronephrosis. No focal abnormality. Normal echotexture.  Left Kidney  Length: 11.1 cm. Slight hydronephrosis.  No focal abnormality. Normal echotexture.  Bladder:  Bladder is not visualized, decompressed.  IMPRESSION: Mild hydronephrosis bilaterally, right slightly greater than left. Nonvisualization of the bladder which is decompressed.   Electronically Signed   By: Charlett Nose M.D.   On: 03/15/2013 23:47    Assessment/Plan:  Renal insufficiency  Bilateral hydronephrosis  Metastatic breast cancer  Remove Foley  Myrbetriq for bladder spasms   LOS: 2 days   Rebecca Pugh 03/17/2013, 8:16 AM

## 2013-03-17 NOTE — Progress Notes (Signed)
Pt c/o tremors in hands and neck. States takes prozac and ativan daily at home. Dr. Arbutus Leas texted to request meds.

## 2013-03-17 NOTE — Progress Notes (Signed)
[facility] PROGRESS NOTE  Rebecca Pugh ZOX:[id number] DOB: 31-Dec-1950 DOA: 03/15/2013 PCP: [name], MD  Assessment/Plan: AKI/Obstructive uropathy -s/p cystoscopy with bilateral retrograde pyelograms and insertion of bilateral JJ stents on 03/16/13 -appreciate urology followup -renal function continues to improve Anemia -secondary to chemo -s/p 2 units PRBC on 9/29 Hyperglycemia -Hemoglobin A1c 5.8 -Likely due to recent steroid -Discontinue NovoLog Leukopenia -Patient has been chronically leukopenic, but WBC trending down -Monitor closely -Remained afebrile and hemodynamically stable Metastatic breast adenocarcinoma -per [name] Anxiety -Continue Prozac and Ativan  Family Communication:   husband at beside Disposition Plan:   Home when medically stable     Procedures/Studies: US Renal  03/15/2013   CLINICAL DATA:  Urinary retention.  EXAM: RENAL/URINARY TRACT ULTRASOUND COMPLETE  COMPARISON:  CT 02/11/2013  FINDINGS: Right Kidney  Length: 10.7 cm. Mild right hydronephrosis. No focal abnormality. Normal echotexture.  Left Kidney  Length: 11.1 cm. Slight hydronephrosis. No focal abnormality. Normal echotexture.  Bladder:  Bladder is not visualized, decompressed.  IMPRESSION: Mild hydronephrosis bilaterally, right slightly greater than left. Nonvisualization of the bladder which is decompressed.   Electronically Signed   By: Charlett Nose M.D.   On: 03/15/2013 23:47         Subjective: Patient feels nauseous. Denies any fevers, chills, chest discomfort, shortness breath, vomiting, diarrhea, abdominal pain, dysuria and hematuria.  Objective: Filed Vitals:   03/17/13 0134 03/17/13 0532 03/17/13 0942 03/17/13 1355  BP: 129/56 145/63 119/50 142/58  Pulse: 84 89 81 82  Temp: 99 F (37.2 C) 100.3 F (37.9 C) 98.4 F (36.9 C) 98.7 F (37.1 C)  TempSrc: Oral Oral Oral Oral  Resp:  19 18   Height:      Weight:      SpO2: 100% 100% 95% 98%     Intake/Output Summary (Last 24 hours) at 03/17/13 1904 Last data filed at 03/17/13 1842  Gross per 24 hour  Intake    834 ml  Output   1700 ml  Net   -866 ml   Weight change:  Exam:   General:  Pt is alert, follows commands appropriately, not in acute distress  HEENT: No icterus, No thrush, No neck mass, [place]/AT  Cardiovascular: RRR, S1/S2, no rubs, no gallops  Respiratory: CTA bilaterally, no wheezing, no crackles, no rhonchi  Abdomen: Soft/+BS, non tender, non distended, no guarding  Extremities: No edema, No lymphangitis, No petechiae, No rashes, no synovitis  Data Reviewed: Basic Metabolic Panel:  Recent Labs Lab 03/15/13 0911 03/15/13 2145 03/16/13 0909 03/17/13 0450  NA 140 134* 136 139  K 4.0 5.2* 4.6 4.3  CL  --  100 103 106  CO2 23 22 21 23   GLUCOSE 114 213* 118* 100*  BUN 21.8 33* 40* 33*  CREATININE 2.8* 3.96* 4.73* 2.95*  CALCIUM 8.5 8.0* 8.1* 7.9*  PHOS  --   --  4.3  --    Liver Function Tests:  Recent Labs Lab 03/15/13 0911  AST 20  ALT 13  ALKPHOS 95  BILITOT 0.78  PROT 5.9*  ALBUMIN 2.8*   No results found for this basename: LIPASE, AMYLASE,  in the last 168 hours No results found for this basename: AMMONIA,  in the last 168 hours CBC:  Recent Labs Lab 03/15/13 0910 03/15/13 2145 03/16/13 0909 03/17/13 0450  WBC 2.6* 2.5* 3.7* 1.8*  NEUTROABS 1.6 2.1  --   --   HGB 8.0* 7.1* 9.0* 8.3*  HCT 24.2* 21.2* 26.5* 24.4*  MCV 93.8 92.6 88.9  90.7  PLT 170 154 177 199   Cardiac Enzymes: No results found for this basename: CKTOTAL, CKMB, CKMBINDEX, TROPONINI,  in the last 168 hours BNP: No components found with this basename: POCBNP,  CBG:  Recent Labs Lab 03/16/13 1707 03/16/13 2121 03/17/13 0733 03/17/13 1147 03/17/13 1701  GLUCAP 91 107* 124* 94 121*    Recent Results (from the past 240 hour(s))  MRSA PCR SCREENING     Status: None   Collection Time    03/16/13 12:51 AM      Result Value Range Status   MRSA by  PCR NEGATIVE  NEGATIVE Final   Comment:            The GeneXpert MRSA Assay (FDA     approved for NASAL specimens     only), is one component of a     comprehensive MRSA colonization     surveillance program. It is not     intended to diagnose MRSA     infection nor to guide or     monitor treatment for     MRSA infections.     Performed at [facility]     Scheduled Meds: . FLUoxetine  40 mg Oral QHS  . insulin aspart  0-9 Units Subcutaneous TID WC  . lapatinib  1,000 mg Oral Daily  . mirabegron ER  25 mg Oral Daily  . sodium chloride  3 mL Intravenous Q12H   Continuous Infusions: . sodium chloride 20 mL/hr (03/17/13 0700)     Teyona Nichelson, DO  [facility] Pager [phone]  If 7PM-7AM, please contact night-coverage [email or web address] Password [email or web address] 03/17/2013, 7:04 PM   LOS: 2 days

## 2013-03-18 ENCOUNTER — Encounter: Payer: Self-pay | Admitting: Oncology

## 2013-03-18 LAB — GLUCOSE, CAPILLARY: Glucose-Capillary: 93 mg/dL (ref 70–99)

## 2013-03-18 LAB — BASIC METABOLIC PANEL
CO2: 24 mEq/L (ref 19–32)
Chloride: 100 mEq/L (ref 96–112)
GFR calc Af Amer: 33 mL/min — ABNORMAL LOW (ref >90)
Potassium: 3.6 mEq/L (ref 3.5–5.1)
Sodium: 134 mEq/L — ABNORMAL LOW (ref 135–145)

## 2013-03-18 LAB — CBC
HCT: 25.4 % — ABNORMAL LOW (ref 36.0–46.0)
MCHC: 34.3 g/dL (ref 30.0–36.0)
MCV: 89.8 fL (ref 78.0–100.0)
RBC: 2.83 MIL/uL — ABNORMAL LOW (ref 3.87–5.11)
RDW: 18.3 % — ABNORMAL HIGH (ref 11.5–15.5)
WBC: 1.5 10*3/uL — ABNORMAL LOW (ref 4.0–10.5)

## 2013-03-18 MED ORDER — MIRABEGRON ER 25 MG PO TB24: 25.0000 mg | ORAL_TABLET | Freq: Every day | ORAL | Status: AC

## 2013-03-18 MED ORDER — HEPARIN SOD (PORK) LOCK FLUSH 100 UNIT/ML IV SOLN
500.0000 [IU] | INTRAVENOUS | Status: AC | PRN
  Administered 2013-03-18: 18:00:00 500 [IU]

## 2013-03-18 NOTE — Discharge Summary (Addendum)
Physician Discharge Summary  Rebecca Pugh ZOX:[id number] DOB: 1950-09-11 DOA: 03/15/2013  PCP: [name], MD  Admit date: 03/15/2013 Discharge date: 03/18/2013  Recommendations for Outpatient Follow-up:  1. Pt will need to follow up with PCP in 2 weeks post discharge 2. Please obtain BMP to evaluate electrolytes and kidney function 3. Please also check CBC to evaluate Hg and Hct levels   Discharge Diagnoses:  Principal Problem:   ARF (acute renal failure) Active Problems:   Breast cancer metastasized to multiple sites   Urinary obstruction   Anemia AKI/Obstructive uropathy  -s/p cystoscopy with bilateral retrograde pyelograms and insertion of bilateral JJ stents on 03/16/13  -appreciate urology followup  -renal function continues to improve  -Serum creatinine peaked at 4.73 during this admission -Serum creatinine 1.87 on the day of discharge -I expect serum creatinine to continue to improve -Patient will need followup with her primary care physician for BMP in one week -Baseline creatinine 1.1-1.5 Anemia  -secondary to chemo  -s/p 2 units PRBC on 9/29  -Hemoglobin remained stable after transfusion Hyperglycemia  -Hemoglobin A1c 5.8  -Likely due to recent steroid  -Discontinue NovoLog  Leukopenia  -Patient has been chronically leukopenic, but WBC trending down  -Monitor closely--WBC trended down and was 1.5 on the day of discharge -Remained afebrile and hemodynamically stable  -Discussed with [name] prior to pt discharge and he was comfortable with this value -Patient instructed to come to the ED or call oncology immediately should she have any temperatures greater than 100.37F Metastatic breast adenocarcinoma  -per [name]  Anxiety  -Continue Prozac and Ativan Diarrhea -this was likely due to restarting the Tykerb which pt states has occurred in the past after a short lapse in tx -Patient was instructed to return to ED should she have fever, abd pain,  hematochezia or melena Severe Malnutrition -in the context of chronic illness Discharge Condition: stable  Disposition:  Follow-up Information   Follow up with NESI,MARC-HENRY, MD In 2 weeks.   Specialty:  Urology   Contact information:   [address] [place] [address] [phone]       Diet:regular Wt Readings from Last 3 Encounters:  03/18/13 58.8 kg (129 lb 10.1 oz)  03/18/13 58.8 kg (129 lb 10.1 oz)  03/15/13 58.151 kg (128 lb 3.2 oz)    History of present illness:  62 y.o. female with known history of metastatic breast carcinoma who is on chemotherapy and has had bilateral ureteral stents removed 3 days PTA has had chemotherapy 03/15/13 following which patient noticed that she is unable to urinate. Patient in the ER and labs done which showed increased creatinine for baseline and bladder scan showed no urine. Renal sonogram shows mild bilateral hydronephrosis. On-call urologist Dr. Mena Goes was consulted and they will be seeing patient in consult. Patient patient was found to be anemic and original plan by the oncologist was to transfuse packed red blood cell today. Patient otherwise denies any nausea vomiting shortness of breath chest pain abdominal pain or diarrhea. Patient has been readmitted for further management.  Pt was started on IVF.     Consultants: Urology--Dr. Brunilda Payor  Discharge Exam: Filed Vitals:   03/18/13 1342  BP: 148/71  Pulse: 75  Temp: 97.7 F (36.5 C)  Resp: 16   Filed Vitals:   03/17/13 1355 03/17/13 2059 03/18/13 0529 03/18/13  1342  BP: 142/58 152/72 140/75 148/71  Pulse: 82 77 74 75  Temp: 98.7 F (37.1 C) 98.6 F (37 C) 98 F (36.7 C) 97.7 F (36.5 C)  TempSrc: Oral Oral Oral Oral  Resp:  19 20 16   Height:      Weight:   58.8 kg (129 lb 10.1 oz)   SpO2: 98% 96% 95% 97%   General: A&O x 3, NAD, pleasant, cooperative Cardiovascular: RRR, no rub, no gallop, no S3 Respiratory: CTAB,  no wheeze, no rhonchi Abdomen:soft, nontender, nondistended, positive bowel sounds Extremities: No edema, No lymphangitis, no petechiae  Discharge Instructions  Discharge Orders   Future Appointments Provider Department Dept Phone   03/22/2013 9:00 AM [facility] Only [facility] MED[facility] ONCOL[facility] [phone]   03/22/2013 9:30 AM Keitha Butte, NP [facility] MEDICAL ONCOLOGY [phone]   03/22/2013 10:30 AM [facility] D[facility] MED[facility] ONCOL[facility] [phone]   03/25/2013 9:00 AM [facility] 1 [facility]MRI [phone]   Patient to arrive 15 minutes prior to appointment time.   03/26/2013 10:00 AM [facility]-CARD[facility] ULTRASOUND [phone]   03/29/2013 1:45 PM Krista [facility] MEDICAL ONCOLOGY [phone]   03/29/2013 2:15 PM [name] Allegra Grana, PA-C [facility] MEDICAL ONCOLOGY [phone]   Future Orders Complete By Expires   Increase activity slowly  As directed        Medication List         calcium citrate 950 MG tablet  Commonly known as:  CALCITRATE - dosed in mg elemental calcium  Take 1 tablet by mouth daily.     cholecalciferol 1000 UNITS tablet  Commonly known as:  VITAMIN D  Take 3,000 Units by mouth daily.     FLUoxetine 40 MG capsule  Commonly known as:  PROZAC  TAKE ONE CAPSULE BY MOUTH ONCE DAILY     gabapentin 300 MG capsule  Commonly known as:  NEURONTIN  Take 1 capsule (300 mg total) by mouth at bedtime.     lapatinib 250 MG tablet  Commonly known as:  TYKERB  Take 4 tablets (1,000 mg total) by mouth daily.     letrozole 2.5 MG tablet  Commonly known as:  FEMARA  Take 2.5 mg by mouth daily.     lidocaine-prilocaine cream  Commonly known as:  EMLA  Apply topically as needed.     loperamide 2 MG capsule  Commonly known as:  IMODIUM  Take 2 mg by mouth 4 (four) times daily as needed. diarrhea     LORazepam 0.5 MG  tablet  Commonly known as:  ATIVAN  Take 1 tablet (0.5 mg total) by mouth 2 (two) times daily as needed.     metoCLOPramide 10 MG tablet  Commonly known as:  REGLAN  Take 0.5 tablets (5 mg total) by mouth 4 (four) times daily -  before meals and at bedtime.     mirabegron ER 25 MG Tb24 tablet  Commonly known as:  MYRBETRIQ  Take 1 tablet (25 mg total) by mouth daily.     omeprazole 40 MG capsule  Commonly known as:  PRILOSEC  Take 1 capsule (40 mg total) by mouth at bedtime.     ondansetron 8 MG tablet  Commonly known as:  ZOFRAN  Take 8 mg by mouth 2 (two) times daily as needed for nausea.     oxyCODONE 5 MG immediate release tablet  Commonly known as:  Oxy IR/ROXICODONE  Take 1 tablet (5 mg total) by mouth every 6 (six) hours as needed.     predniSONE 5 MG tablet  Commonly known as:  DELTASONE  TAKE ONE TABLET BY MOUTH ONCE DAILY.  TAKE FOR 4 DAYS AFTER DISCONTINUING DEXAMETHASONE.     prochlorperazine 10 MG tablet  Commonly known as:  COMPAZINE  Take 1 tablet (10 mg total) by mouth every 6 (six) hours as needed.         The results of significant diagnostics from this hospitalization (including imaging, microbiology, ancillary and laboratory) are listed below for reference.    Significant Diagnostic Studies: US Renal  03/15/2013   CLINICAL DATA:  Urinary retention.  EXAM: RENAL/URINARY TRACT ULTRASOUND COMPLETE  COMPARISON:  CT 02/11/2013  FINDINGS: Right Kidney  Length: 10.7 cm. Mild right hydronephrosis. No focal abnormality. Normal echotexture.  Left Kidney  Length: 11.1 cm. Slight hydronephrosis. No focal abnormality. Normal echotexture.  Bladder:  Bladder is not visualized, decompressed.  IMPRESSION: Mild hydronephrosis bilaterally, right slightly greater than left. Nonvisualization of the bladder which is decompressed.   Electronically Signed   By: Charlett Nose M.D.   On: 03/15/2013 23:47     Microbiology: Recent Results (from the past 240 hour(s))  MRSA PCR  SCREENING     Status: None   Collection Time    03/16/13 12:51 AM      Result Value Range Status   MRSA by PCR NEGATIVE  NEGATIVE Final   Comment:            The GeneXpert MRSA Assay (FDA     approved for NASAL specimens     only), is one component of a     comprehensive MRSA colonization     surveillance program. It is not     intended to diagnose MRSA     infection nor to guide or     monitor treatment for     MRSA infections.     Performed at [facility]     Labs: Basic Metabolic Panel:  Recent Labs Lab 03/15/13 0911  03/15/13 2145 03/16/13 0909 03/17/13 0450 03/18/13 0940  NA 140  --  134* 136 139 134*  K 4.0  < > 5.2* 4.6 4.3 3.6  CL  --   --  100 103 106 100  CO2 23  --  22 21 23 24   GLUCOSE 114  --  213* 118* 100* 93  BUN 21.8  --  33* 40* 33* 25*  CREATININE 2.8*  --  3.96* 4.73* 2.95* 1.87*  CALCIUM 8.5  --  8.0* 8.1* 7.9* 8.2*  PHOS  --   --   --  4.3  --   --   < > = values in this interval not displayed. Liver Function Tests:  Recent Labs Lab 03/15/13 0911  AST 20  ALT 13  ALKPHOS 95  BILITOT 0.78  PROT 5.9*  ALBUMIN 2.8*   No results found for this basename: LIPASE, AMYLASE,  in the last 168 hours No results found for this basename: AMMONIA,  in the last 168 hours CBC:  Recent Labs Lab 03/15/13 0910 03/15/13 2145 03/16/13 0909 03/17/13 0450 03/18/13 1436  WBC 2.6* 2.5* 3.7* 1.8* 1.5*  NEUTROABS 1.6 2.1  --   --   --   HGB 8.0* 7.1* 9.0* 8.3* 8.7*  HCT 24.2* 21.2* 26.5* 24.4* 25.4*  MCV 93.8 92.6 88.9 90.7 89.8  PLT 170 154 177 199 163   Cardiac  Enzymes: No results found for this basename: CKTOTAL, CKMB, CKMBINDEX, TROPONINI,  in the last 168 hours BNP: No components found with this basename: POCBNP,  CBG:  Recent Labs Lab 03/17/13 1701 03/17/13 2322 03/18/13 0730 03/18/13 1141 03/18/13 1649  GLUCAP 121* 112* 86 77 93    Time coordinating discharge:  Greater than 30 minutes  Signed:  Magdala Brahmbhatt, DO [facility] Pager: [phone] 03/18/2013, 5:10 PM

## 2013-03-18 NOTE — Progress Notes (Signed)
2 Days Post-Op Subjective: Patient reports No suprapubic pain.  Objective: Vital signs in last 24 hours: Temp:  [98 F (36.7 C)-98.7 F (37.1 C)] 98 F (36.7 C) (10/02 0529) Pulse Rate:  [74-82] 74 (10/02 0529) Resp:  [19-20] 20 (10/02 0529) BP: (140-152)/(58-75) 140/75 mmHg (10/02 0529) SpO2:  [95 %-98 %] 95 % (10/02 0529) Weight:  [58.8 kg (129 lb 10.1 oz)] 58.8 kg (129 lb 10.1 oz) (10/02 0529)  Intake/Output from previous day: 10/01 0701 - 10/02 0700 In: 1072.3 [P.O.:600; I.V.:472.3] Out: 2025 [Urine:2025] Intake/Output this shift: Total I/O In: -  Out: 301 [Urine:300; Stool:1]  Physical Exam:  General: Alert and oriented. Voids well.  Urine clear. Urinary output: 2025 cc yesterday Creatinine down to 1.87 today   Lab Results:  Recent Labs  03/15/13 2145 03/16/13 0909 03/17/13 0450  HGB 7.1* 9.0* 8.3*  HCT 21.2* 26.5* 24.4*   BMET  Recent Labs  03/17/13 0450 03/18/13 0940  NA 139 134*  K 4.3 3.6  CL 106 100  CO2 23 24  GLUCOSE 100* 93  BUN 33* 25*  CREATININE 2.95* 1.87*  CALCIUM 7.9* 8.2*   No results found for this basename: LABPT, INR,  in the last 72 hours No results found for this basename: LABURIN,  in the last 72 hours Results for orders placed during the hospital encounter of 03/15/13  MRSA PCR SCREENING     Status: None   Collection Time    03/16/13 12:51 AM      Result Value Range Status   MRSA by PCR NEGATIVE  NEGATIVE Final   Comment:            The GeneXpert MRSA Assay (FDA     approved for NASAL specimens     only), is one component of a     comprehensive MRSA colonization     surveillance program. It is not     intended to diagnose MRSA     infection nor to guide or     monitor treatment for     MRSA infections.     Performed at [facility]    Studies/Results: No results found.  Assessment/Plan:  Renal insufficiency: improving.  OK for urologic discharge.  Will follow as outpatient.   LOS: 3 days    Hennessy Bartel-HENRY 03/18/2013, 12:35 PM

## 2013-03-19 LAB — TYPE AND SCREEN
Antibody Screen: NEGATIVE
Unit division: 0
Unit division: 0

## 2013-03-22 ENCOUNTER — Ambulatory Visit (HOSPITAL_BASED_OUTPATIENT_CLINIC_OR_DEPARTMENT_OTHER): Payer: Medicare Other | Admitting: Family

## 2013-03-22 ENCOUNTER — Other Ambulatory Visit: Payer: Medicare Other | Admitting: Lab

## 2013-03-22 ENCOUNTER — Ambulatory Visit: Payer: Medicare Other

## 2013-03-22 ENCOUNTER — Other Ambulatory Visit (HOSPITAL_BASED_OUTPATIENT_CLINIC_OR_DEPARTMENT_OTHER): Payer: Medicare Other

## 2013-03-22 ENCOUNTER — Other Ambulatory Visit: Payer: Self-pay | Admitting: *Deleted

## 2013-03-22 ENCOUNTER — Encounter: Payer: Self-pay | Admitting: Family

## 2013-03-22 ENCOUNTER — Encounter: Payer: Self-pay | Admitting: Oncology

## 2013-03-22 VITALS — BP 122/79 | HR 84 | Temp 98.3°F | Resp 18 | Ht 64.0 in | Wt 124.5 lb

## 2013-03-22 DIAGNOSIS — N1330 Unspecified hydronephrosis: Secondary | ICD-10-CM

## 2013-03-22 DIAGNOSIS — C50219 Malignant neoplasm of upper-inner quadrant of unspecified female breast: Secondary | ICD-10-CM

## 2013-03-22 DIAGNOSIS — C773 Secondary and unspecified malignant neoplasm of axilla and upper limb lymph nodes: Secondary | ICD-10-CM

## 2013-03-22 DIAGNOSIS — D709 Neutropenia, unspecified: Secondary | ICD-10-CM

## 2013-03-22 DIAGNOSIS — Z170 Estrogen receptor positive status [ER+]: Secondary | ICD-10-CM

## 2013-03-22 DIAGNOSIS — R5381 Other malaise: Secondary | ICD-10-CM

## 2013-03-22 DIAGNOSIS — C7970 Secondary malignant neoplasm of unspecified adrenal gland: Secondary | ICD-10-CM

## 2013-03-22 DIAGNOSIS — C50912 Malignant neoplasm of unspecified site of left female breast: Secondary | ICD-10-CM

## 2013-03-22 DIAGNOSIS — M216X9 Other acquired deformities of unspecified foot: Secondary | ICD-10-CM

## 2013-03-22 DIAGNOSIS — C50919 Malignant neoplasm of unspecified site of unspecified female breast: Secondary | ICD-10-CM

## 2013-03-22 LAB — COMPREHENSIVE METABOLIC PANEL (CC13)
ALT: 251 U/L — ABNORMAL HIGH (ref 0–55)
AST: 285 U/L — ABNORMAL HIGH (ref 5–34)
Alkaline Phosphatase: 132 U/L (ref 40–150)
BUN: 10.1 mg/dL (ref 7.0–26.0)
CO2: 26 mEq/L (ref 22–29)
Creatinine: 1.4 mg/dL — ABNORMAL HIGH (ref 0.6–1.1)
Sodium: 142 mEq/L (ref 136–145)
Total Bilirubin: 0.79 mg/dL (ref 0.20–1.20)
Total Protein: 7.3 g/dL (ref 6.4–8.3)

## 2013-03-22 LAB — CBC WITH DIFFERENTIAL/PLATELET
BASO%: 0.7 % (ref 0.0–2.0)
EOS%: 0 % (ref 0.0–7.0)
Eosinophils Absolute: 0 10*3/uL (ref 0.0–0.5)
HCT: 31.2 % — ABNORMAL LOW (ref 34.8–46.6)
LYMPH%: 35.7 % (ref 14.0–49.7)
MCH: 29.9 pg (ref 25.1–34.0)
MCHC: 33.7 g/dL (ref 31.5–36.0)
MONO#: 0.2 10*3/uL (ref 0.1–0.9)
NEUT%: 47.5 % (ref 38.4–76.8)
Platelets: 131 10*3/uL — ABNORMAL LOW (ref 145–400)
RBC: 3.51 10*6/uL — ABNORMAL LOW (ref 3.70–5.45)
WBC: 1.4 10*3/uL — ABNORMAL LOW (ref 3.9–10.3)
lymph#: 0.5 10*3/uL — ABNORMAL LOW (ref 0.9–3.3)

## 2013-03-22 NOTE — Patient Instructions (Addendum)
Please contact us at [phone] if you have any questions or concerns.  Please continue to do well and enjoy life!!!  Get plenty of rest, drink plenty of water, exercise daily (chair yoga), eat a balanced diet.  Results for orders placed in visit on 03/22/13 (from the past 24 hour(s))  CBC WITH DIFFERENTIAL     Status: Abnormal   Collection Time    03/22/13  9:09 AM      Result Value Range   WBC 1.4 (*) 3.9 - 10.3 10e3/uL   NEUT# 0.7 (*) 1.5 - 6.5 10e3/uL   HGB 10.5 (*) 11.6 - 15.9 g/dL   HCT 40.9 (*) 81.1 - 91.4 %   Platelets 131 (*) 145 - 400 10e3/uL   MCV 88.9  79.5 - 101.0 fL   MCH 29.9  25.1 - 34.0 pg   MCHC 33.7  31.5 - 36.0 g/dL   RBC 7.82 (*) 9.56 - 2.13 10e6/uL   RDW 16.9 (*) 11.2 - 14.5 %   lymph# 0.5 (*) 0.9 - 3.3 10e3/uL   MONO# 0.2  0.1 - 0.9 10e3/uL   Eosinophils Absolute 0.0  0.0 - 0.5 10e3/uL   Basophils Absolute 0.0  0.0 - 0.1 10e3/uL   NEUT% 47.5  38.4 - 76.8 %   LYMPH% 35.7  14.0 - 49.7 %   MONO% 16.1 (*) 0.0 - 14.0 %   EOS% 0.0  0.0 - 7.0 %   BASO% 0.7  0.0 - 2.0 %   Narrative:    Performed At:  [facility]               [address]               [place], [place] [address]

## 2013-03-22 NOTE — Progress Notes (Signed)
Per Abigail patient is needing help with meds. There is no asst for Tykerb. I advised her that I would need proof of the patient's household income to see if qualifies for 600.00 and 400.00 grants.

## 2013-03-22 NOTE — Progress Notes (Addendum)
[facility]  [phone] [phone]  OFFICE PROGRESS NOTE   ID: Kamrie Fanton   DOB: 1950-07-27  MR#: [id number]  DGU#:[id number]   PCP: [name], MD SU: [name], MD URO: Toma Copier. Nesi, MD   HISTORY OF PRESENT ILLNESS: The patient and her husband, Gene, moved back to this area in 2007 from [facility] (Gene actually is a native of [place] and incidentally remains a Herbalist).  As part of moving boxes, Ms. Ballard felt something under her left arm.  She brought it to [name] attention and although the patient states that she has always had some cyclical swelling of her left axilla during menstruation, [name] set the patient up for evaluation at [facility].  On 12-12-2005 the patient had a mammogram and ultrasound there and these showed no mass, distortion or microcalcifications in either breast however, in the left axilla there was a large macrolobulated mass and just above that there was a slightly prominent lymph node which did demonstrate a fatty hilum.  Physically on exam the mass was mobile and nontender and measured approximately 6.5 cm.  The ultrasound showed two small hypoechoic nodules in the left breast 3 cm. from the nipple measuring 7 mm. each and felt to be most likely benign however, the axillary mass, of course, was quite suspicious and biopsy was performed the same day. This showed ([id number] and [id number]) invasive breast cancer which was strongly ER positive at 76%, moderately PR positive at 25% with a very high proliferation marker at 66%.  HER-2/neu was 1+ and FISH showed no amplification with a ratio of 1.1.    With this information, the patient was referred to Dr. [name] and on 12-20-05 the patient had bilateral breast MRI's.  In the left breast there was a 5.8 cm. axillary mass and several abnormally enlarged lymph nodes.  There was a 7 mm. left supraclavicular lymph node and the two nodules in the breast  that had been noted by ultrasound were also noted by MRI measuring 1.1 and 0.7 cm.  In the right breast there were no abnormalities noted.  MR guided biopsies of the left breast masses were obtained on 12-27-05 and both showed high grade ductal carcinoma.  There was evidence of lymphovascular invasion and high grade ductal carcinoma in situ as well ([id number]).    [name] discussed the situation with [name] and they felt it would be useful to have a PET scan prior to definitive surgery.  This was obtained on 12-30-2005 and showed the primary axillary mass to have an SUV of 8.5.  The lymph nodes in the left axilla had SUV's in the 2.2 range, small focus of increased activity in the left breast had an SUV of 2.3.  There was also skin thickening with mildly increased diffuse FDG uptake within the skin suggesting an inflammatory breast cancer.  The neck, abdomen and  pelvis were negative.  In the lung windows, there were several scattered small areas of focal nodular density in the upper lobes with compressive atelectasis and this was felt to be benign.  Chest x-ray on 01-09-2006 was negative.  With this information, [name] proceeded, after appropriate discussion, to left modified radical mastectomy on 01-15-2006 and the insertion of a BARD port at the same time.  The final pathology ([id number]) showed the maximum tumor size to be 5 cm.  Margins were ample.  There was evidence of lymphovascular invasion.  This infiltrating ductal carcinoma was  a grade 3 of 3 and 11 out of 14 lymph nodes were involved.  There was evidence of extracapsular extension.  Her subsequent history is as detailed below.   INTERVAL HISTORY: [name] and I saw Anu Stagner today who was accompanied by her husband Gene for followup of her metastatic breast carcinoma. She's currently receiving carboplatin/gemcitabine, both agents given on days 1 and 8 of each 21 day cycle.  She is scheduled to receive day 8 cycle 5 of 5  today, but unfortunately her ANC at 0.7 today does not meet minimum chemotherapy treatment requirements. She is also on lapatinib, 1000 mg daily.  Overall, Carmesha feels that she has been tolerating the treatment regimen well.  Her interval history is significant for the development of acute renal failure and she was hospitalized from 03/15/2013 through 03/18/2013.  She underwent cystoscopy, bilateral retrograde pyelograms and insertion of bilateral ureteral double-J stents while hospitalized.     Since her last office visit on 03/15/2013, the patient and her husband expressed a great deal of anxiety regarding their financial situation and lapatinib costing them approximately $1600 a month.  The patient's husband states that he has been charging the cost of the medication to his credit card, but can only do this for 3 more months.  Omesha states that because of her anxiety/stress she has been increasingly nauseated.  We discussed extensively the number of support groups/support programs that are available to her and her husband.  We also discussed her speaking with Dr. Enzo Bi about her uncontrolled anxiety.  The patient states she is not ready to speak to Dr. Noe Gens just yet, but will keep this option in mind.  Explained to Mr. and Mrs. Dugo that I will be speaking with our social workers and financial counselors about their dire financial situation.     REVIEW OF SYSTEMS: A 10 point review of systems was completed and is negative except as stated above.   Mrs. Barbuto denies any other symptomatology including fever or chills, headache, vision changes, swollen glands, cough or shortness of breath, chest pain or discomfort,  vomiting, diarrhea, constipation, change in urinary or bowel habits, arthralgias/myalgias, unusual bleeding/bruising or any other symptomatology.  A detailed review of systems is otherwise stable and noncontributory.   PAST MEDICAL HISTORY: Past Medical History  Diagnosis Date   . Hypertension   . Depression   . Hydronephrosis, bilateral   . Breast cancer metastasized to multiple sites POSITIVE METS IN 2012-----  ONCOLOGIST--  DR Darnelle Catalan--  CURRENTLY RECEIVING CHEMO  EVERY 3 WEEKS (STARTED 06-15-2012)    FIRST DX 01-15-2006, INVASIVE DUCTAL GRADE III/    S/P LEFT MASTECTOMY AND CHEMORADIATION (CHEMO COMPLETE NOV 2007 AND XRT COMPLETE FEB 2008)  . Acute renal failure     PAST SURGICAL HISTORY: Past Surgical History  Procedure Laterality Date  . Orif ankle fracture  01/31/2012    Procedure: OPEN REDUCTION INTERNAL FIXATION (ORIF) ANKLE FRACTURE;  Surgeon: Loanne Drilling, MD;  Location: [facility];  Service: Orthopedics;  Laterality: Right;  . Port-a-cath placement  12-05-2010  . Mastectomy modified radical Left 01-15-2006    W/ LYMPH NODE DISSECTIONS AND PORT-A-CATH PLACEMENT (REMOVAL 04-06-2007)  . Transthoracic echocardiogram  09-08-2012    MILD LVF/  NORMAL LVSF/ EF 60-65%  . Cholecystectomy  1977    AND APPENDECTOMY  . Tonsillectomy  AS CHILD  . Cystoscopy w/ ureteral stent placement Bilateral 09/11/2012    Procedure: CYSTOSCOPY WITH RETROGRADE PYELOGRAM/URETERAL STENT PLACEMENT;  Surgeon: Lindaann Slough, MD;  Location: [facility];  Service: Urology;  Laterality: Bilateral;  1 HR   . Transthoracic echocardiogram  09-08-2012    mild lvf/ lvsf normal/ ef 60-65%  . Cystoscopy with stent placement N/A 10/20/2012    Procedure: CYSTOSCOPY, bilateral retrogrades WITH bilateral  STENT RE PLACEMENTs;  Surgeon: Lindaann Slough, MD;  Location: [facility];  Service: Urology;  Laterality: N/A;  BILATERAL JJ STENT PLACEMENT   . Cystoscopy w/ ureteral stent placement Bilateral 01/15/2013    Procedure: CYSTOSCOPY WITH STENT REPLACEMENT;  Surgeon: Lindaann Slough, MD;  Location: [facility];  Service: Urology;  Laterality: Bilateral;  . Stent removal Bilateral 03/12/2013    Ureteral stent  . Appendectomy    . Skin biopsy       Simple keratoses  . Cystoscopy w/ ureteral stent placement Bilateral 03/16/2013    Procedure: CYSTOSCOPY WITH RETROGRADE PYELOGRAM/URETERAL STENT PLACEMENT;  Surgeon: Lindaann Slough, MD;  Location: [facility];  Service: Urology;  Laterality: Bilateral;     FAMILY HISTORY:  Family History  Problem Relation Age of Onset  . COPD Mother   . Cancer Mother     Bladder cancer  . Healthy Sister   The patient has no information regarding her father. She was brought up by her stepfather. The patient's mother died at the age of 63 with bladder cancer. The patient has one sister who is in good health. There is no history of breast or ovarian cancer in the family to the patient's knowledge.    GYNECOLOGIC HISTORY: She is G2, P2, age of menses 24, age parity 85, age of menopause 57.   SOCIAL HISTORY: She and her husband, Gene, have been married 10 years. 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 She has two children, a daughter, [name], who lives in [place] and is a Investment banker, corporate, and a son, [name], also in [place], who works for [employer]. She has two grandchildren and three step-grandchildren from her own two children, [name] and [name], who are from her first marriage. Gene has one daughter from his first marriage and he has grandchildren through her. The patient was brought up as a Catholic but currently is not practicing. She and Gene are attending a [employer].     ADVANCED DIRECTIVES: Not on file   HEALTH MAINTENANCE: History  Substance Use Topics  . Smoking status: Former Smoker -- 20 years    Quit date: 01/31/1995  . Smokeless tobacco: Never Used  . Alcohol Use: Yes     Comment: Occasional     Colonoscopy:  Not on file  PAP: Not on file  Bone density: August 2012, "Normal"  Lipid panel: Not on file  No Known Allergies  Current Outpatient Prescriptions  Medication Sig Dispense Refill    . calcium citrate (CALCITRATE - DOSED IN MG ELEMENTAL CALCIUM) 950 MG tablet Take 1 tablet by mouth daily.       . cholecalciferol (VITAMIN D) 1000 UNITS tablet Take 3,000 Units by mouth daily.       Marland Kitchen FLUoxetine (PROZAC) 40 MG capsule TAKE ONE CAPSULE BY MOUTH ONCE DAILY  30 capsule  6  . gabapentin (NEURONTIN) 300 MG capsule Take 1 capsule (300 mg total) by mouth at bedtime.  30 capsule  5  . lapatinib (TYKERB) 250 MG tablet Take 4 tablets (1,000 mg total) by mouth daily.  120 tablet  3  . letrozole (  FEMARA) 2.5 MG tablet Take 2.5 mg by mouth daily.       Marland Kitchen lidocaine-prilocaine (EMLA) cream Apply topically as needed.  30 g  3  . loperamide (IMODIUM) 2 MG capsule Take 2 mg by mouth 4 (four) times daily as needed. diarrhea      . LORazepam (ATIVAN) 0.5 MG tablet Take 1 tablet (0.5 mg total) by mouth 2 (two) times daily as needed.  60 tablet  0  . metoCLOPramide (REGLAN) 10 MG tablet Take 0.5 tablets (5 mg total) by mouth 4 (four) times daily -  before meals and at bedtime.  120 tablet  12  . mirabegron ER (MYRBETRIQ) 25 MG TB24 tablet Take 1 tablet (25 mg total) by mouth daily.  30 tablet  0  . omeprazole (PRILOSEC) 40 MG capsule Take 1 capsule (40 mg total) by mouth at bedtime.  30 capsule  12  . oxyCODONE (OXY IR/ROXICODONE) 5 MG immediate release tablet Take 1 tablet (5 mg total) by mouth every 6 (six) hours as needed.  30 tablet  0  . predniSONE (DELTASONE) 5 MG tablet TAKE ONE TABLET BY MOUTH ONCE DAILY.  TAKE FOR 4 DAYS AFTER DISCONTINUING DEXAMETHASONE.  30 tablet  0  . prochlorperazine (COMPAZINE) 10 MG tablet Take 1 tablet (10 mg total) by mouth every 6 (six) hours as needed.  30 tablet  3  . ondansetron (ZOFRAN) 8 MG tablet Take 8 mg by mouth 2 (two) times daily as needed for nausea.       No current facility-administered medications for this visit.   Facility-Administered Medications Ordered in Other Visits  Medication Dose Route Frequency Provider Last Rate Last Dose  . 0.9 %   sodium chloride infusion   Intravenous Continuous [name] G Berry, PA-C 500 mL/hr at 02/09/13 1524    . heparin lock flush 100 unit/mL  500 Units Intravenous Once Lowella Dell, MD      . sodium chloride 0.9 % injection 10 mL  10 mL Intravenous PRN Lowella Dell, MD         OBJECTIVE:  Middle-aged white woman in no visible distress: Filed Vitals:   03/22/13 0931  BP: 122/79  Pulse: 84  Temp: 98.3 F (36.8 C)  Resp: 18  Body mass index is 21.36 kg/(m^2). Filed Weights   03/22/13 0931  Weight: 124 lb 8 oz (56.473 kg)   ECOG FS: 1 - Symptomatic but completely ambulatory  General appearance: Alert, cooperative, thin frame, visible distress Head: Chemotherapy-induced alopecia, normocephalic, atraumatic, the patient is wearing a hat Eyes: Conjunctivae/corneas clear, PERRLA, EOMI Nose: Nares, septum and mucosa are normal, no drainage or sinus tenderness Neck: No adenopathy, supple, symmetrical, trachea midline, no tenderness Resp: Clear to auscultation bilaterally, no wheezes/rales/rhonchi Cardio: Regular rate and rhythm, S1, S2 normal, no murmur, click, rub or gallop, no edema, right chest Port-A-Cath covered in EMLA cream Breasts:  Deferred GI: Soft, not distended, non-tender, normoactive bowel sounds, no organomegaly Skin: No rashes/lesions, skin warm and dry, no erythematous areas, no cyanosis  M/S:  Atraumatic, limited strength in all extremities, limited range of motion in all extremities (RUE/RLE > LUE/LLE), no clubbing  Lymph nodes: Cervical, supraclavicular, and axillary nodes normal Neurologic: Grossly normal, cranial nerves II through XII intact, alert and oriented x 3 Psych: Appropriate affect   LAB RESULTS:  Lab Results  Component Value Date   WBC 1.4* 03/22/2013   NEUTROABS 0.7* 03/22/2013   HGB 10.5* 03/22/2013   HCT 31.2* 03/22/2013   MCV  88.9 03/22/2013   PLT 131* 03/22/2013      Chemistry      Component Value Date/Time   NA 142 03/22/2013 0909   NA 134*  03/18/2013 0940   NA 142 11/05/2010 1036   K 3.1* 03/22/2013 0909   K 3.6 03/18/2013 0940   K 4.5 11/05/2010 1036   CL 100 03/18/2013 0940   CL 99 11/30/2012 0835   CL 99 11/05/2010 1036   CO2 26 03/22/2013 0909   CO2 24 03/18/2013 0940   CO2 26 11/05/2010 1036   BUN 10.1 03/22/2013 0909   BUN 25* 03/18/2013 0940   BUN 14 11/05/2010 1036   CREATININE 1.4* 03/22/2013 0909   CREATININE 1.87* 03/18/2013 0940   CREATININE 0.8 11/05/2010 1036      Component Value Date/Time   CALCIUM 8.6 03/22/2013 0909   CALCIUM 8.2* 03/18/2013 0940   CALCIUM 8.8 11/05/2010 1036   ALKPHOS 132 03/22/2013 0909   ALKPHOS 88 01/08/2012 0916   ALKPHOS 106* 11/05/2010 1036   AST 285* 03/22/2013 0909   AST 24 01/08/2012 0916   AST 27 11/05/2010 1036   ALT 251* 03/22/2013 0909   ALT 27 01/08/2012 0916   ALT 35 11/05/2010 1036   BILITOT 0.79 03/22/2013 0909   BILITOT 0.9 01/08/2012 0916   BILITOT 0.80 11/05/2010 1036        STUDIES: No results found.    ASSESSMENT: 62 y.o.  [place] with stage IV breast cancer:  1. 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  2. Status post 4 cycles of adjuvant docetaxel, carboplatin and trastuzumab completed November 2007.  3. Postmastectomy radiation completed February 2008.  4. Anastrozole begun February 2008 (the trastuzumab also was continued to complete a year), continued until June 2012.  5. 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 with left retrocrural and retroperitoneal adenopathy.   6. On tamoxifen/lapatinib/trastuzumab between June 2012 and October 2012.   7. Ixempra/lapatinib/trastuzumab started October 2012, the Ixempra discontinued in February 2013 due to peripheral neuropathy.   8. Continuing on  lapatinib/trastuzumab with the addition of fulvestrant, first injections given on 07/31/2011. Lapatinib given at a dose of 1 g (4 tablets) daily, and trastuzumab given at 8 mg/kg every 4 weeks to coordinate with injection appointments.  9. Discontinued lapatinib and fulvestrant in December 2013.  10. Started q. three-week docetaxel/ trastuzumab/ pertuzumab, first dose given 06/15/2012.  Docetaxel given at a 50% dose reduction beginning with cycle 2 due to poor tolerance and severe neutropenia. Completed 6 cycles as of 09/28/2012.   11. Trastuzumab/pertuzumab continued  to June of 2014, with evidence of disease progression. Letrozole added 10/19/2012.  12. Bilateral hydronephrosis noted March 2014, bilateral ureteral stenting performed 09/11/2012 under Dr Brunilda Payor.  13. Herpes Zoster June 2014, resolved.  14. 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  An echocardiogram on 12/07/2012 showed a reduced ejection fraction of 40%, likely secondary to previous treatment with trastuzumab. Subsequently, TDM-1 was discontinued.  15. Started carboplatin/gemcitabine, both agents to be given on days one and 8 of each 21 day cycle, first dose on 12/21/2012.  Carboplatin will be given at an AUC of 2, the gemcitabine at 1000 mg per meter square.   16. Scans in late 01/2013 showed largely stable disease with the exception of  new spots in the liver. Started back on lapatinib, 1000 mg daily, on 02/17/2013, to be given in addition to carboplatin/gemcitabine.   PLAN:  As previously mentioned Mrs. Huyser will not receive chemotherapy treatment day 8 of cycle 5 of 5 cycles consisting of Gemzar/Carboplatin today due to inadequate ANC level.  We plan to see Mrs. Pitter again in 1 week on 03/29/2013 for physical assessment and review of MRI of the liver results.  MRI is scheduled for 03/25/2013 and a repeat echocardiogram is scheduled for 03/26/2013.  I spoke to [facility] LCSW [employer] about the patient's financial  situation secondary to cost prohibitive lapatinib.  She will speak to our financial counselors on the patient's behalf.  I also obtained the telephone number of [facility] [phone]) and spoke with a lady named Elease Hashimoto who will work with the patient to see if she qualifies to obtain lapatinib at discounted price.    With regards to her ongoing weakness/deconditioning/limited mobility/right foot drop, we have re-requested home physical therapy/assessment and rehabilitation.  All questions answered.  Mr. and Mrs. Diprima were encouraged to contact us in the interim with any questions, problems, or concerns.     Larina Bras, NP-C 03/22/2013  1:59 PM  ADDENDUM: Genavieve is having significant problems keeping up with the lapatinib payments. It is not clear at all that we will be able to continue this medication for her. Given her prior cardiac dysfunction with TDM 1, and her progression on trastuzumab/ pertuzumab, it may be difficult to work out is safe and effective HER-2 blockade for her. She is already set up for restaging studies and will return to see me next week to discuss those. She will have a repeat echocardiogram. At that time we should be able to decide whether to continue the current treatment, find her some formal financial assistance for the lapatinib, or drop anti-HER-2 therapy for now.  I personally saw this patient and performed a substantive portion of this encounter with the listed APP documented above.   Lowella Dell, MD

## 2013-03-25 ENCOUNTER — Other Ambulatory Visit: Payer: Self-pay | Admitting: Oncology

## 2013-03-25 ENCOUNTER — Ambulatory Visit (HOSPITAL_COMMUNITY)
Admission: RE | Admit: 2013-03-25 | Discharge: 2013-03-25 | Disposition: A | Discharge status: Home / Self Care | Payer: Medicare Other | Source: Ambulatory Visit | Attending: Oncology | Admitting: Oncology

## 2013-03-25 DIAGNOSIS — C7970 Secondary malignant neoplasm of unspecified adrenal gland: Secondary | ICD-10-CM | POA: Insufficient documentation

## 2013-03-25 DIAGNOSIS — C778 Secondary and unspecified malignant neoplasm of lymph nodes of multiple regions: Secondary | ICD-10-CM | POA: Insufficient documentation

## 2013-03-25 DIAGNOSIS — C50919 Malignant neoplasm of unspecified site of unspecified female breast: Secondary | ICD-10-CM

## 2013-03-25 DIAGNOSIS — N1330 Unspecified hydronephrosis: Secondary | ICD-10-CM | POA: Insufficient documentation

## 2013-03-25 DIAGNOSIS — C787 Secondary malignant neoplasm of liver and intrahepatic bile duct: Secondary | ICD-10-CM | POA: Insufficient documentation

## 2013-03-25 MED ORDER — GADOBENATE DIMEGLUMINE 529 MG/ML IV SOLN
6.0000 mL | Freq: Once | INTRAVENOUS | Status: AC | PRN
  Administered 2013-03-25: 6 mL via INTRAVENOUS

## 2013-03-26 ENCOUNTER — Ambulatory Visit (HOSPITAL_COMMUNITY)
Admission: RE | Admit: 2013-03-26 | Discharge: 2013-03-26 | Disposition: A | Discharge status: Home / Self Care | Payer: Medicare Other | Source: Ambulatory Visit | Attending: Oncology | Admitting: Oncology

## 2013-03-26 DIAGNOSIS — I379 Nonrheumatic pulmonary valve disorder, unspecified: Secondary | ICD-10-CM | POA: Insufficient documentation

## 2013-03-26 DIAGNOSIS — C50919 Malignant neoplasm of unspecified site of unspecified female breast: Secondary | ICD-10-CM | POA: Insufficient documentation

## 2013-03-26 DIAGNOSIS — I059 Rheumatic mitral valve disease, unspecified: Secondary | ICD-10-CM | POA: Insufficient documentation

## 2013-03-26 DIAGNOSIS — C801 Malignant (primary) neoplasm, unspecified: Secondary | ICD-10-CM | POA: Insufficient documentation

## 2013-03-26 DIAGNOSIS — I10 Essential (primary) hypertension: Secondary | ICD-10-CM | POA: Insufficient documentation

## 2013-03-26 NOTE — Progress Notes (Signed)
*  PRELIMINARY RESULTS* Echocardiogram 2D Echocardiogram has been performed.  Rebecca Pugh 03/26/2013, 12:43 PM

## 2013-03-29 ENCOUNTER — Telehealth: Payer: Self-pay | Admitting: Oncology

## 2013-03-29 ENCOUNTER — Other Ambulatory Visit: Payer: Medicare Other | Admitting: Lab

## 2013-03-29 ENCOUNTER — Ambulatory Visit: Payer: Medicare Other | Admitting: Adult Health

## 2013-03-29 ENCOUNTER — Other Ambulatory Visit (HOSPITAL_BASED_OUTPATIENT_CLINIC_OR_DEPARTMENT_OTHER): Payer: Medicare Other | Admitting: Lab

## 2013-03-29 ENCOUNTER — Ambulatory Visit (HOSPITAL_BASED_OUTPATIENT_CLINIC_OR_DEPARTMENT_OTHER): Payer: Medicare Other | Admitting: Oncology

## 2013-03-29 ENCOUNTER — Ambulatory Visit: Payer: Medicare Other | Admitting: Physician Assistant

## 2013-03-29 VITALS — BP 107/69 | HR 85 | Temp 98.1°F | Resp 18 | Ht 64.0 in | Wt 125.9 lb

## 2013-03-29 DIAGNOSIS — C50219 Malignant neoplasm of upper-inner quadrant of unspecified female breast: Secondary | ICD-10-CM

## 2013-03-29 DIAGNOSIS — N1330 Unspecified hydronephrosis: Secondary | ICD-10-CM

## 2013-03-29 DIAGNOSIS — C773 Secondary and unspecified malignant neoplasm of axilla and upper limb lymph nodes: Secondary | ICD-10-CM

## 2013-03-29 DIAGNOSIS — C50919 Malignant neoplasm of unspecified site of unspecified female breast: Secondary | ICD-10-CM

## 2013-03-29 DIAGNOSIS — C50912 Malignant neoplasm of unspecified site of left female breast: Secondary | ICD-10-CM

## 2013-03-29 DIAGNOSIS — C7970 Secondary malignant neoplasm of unspecified adrenal gland: Secondary | ICD-10-CM

## 2013-03-29 DIAGNOSIS — C787 Secondary malignant neoplasm of liver and intrahepatic bile duct: Secondary | ICD-10-CM

## 2013-03-29 DIAGNOSIS — Z170 Estrogen receptor positive status [ER+]: Secondary | ICD-10-CM

## 2013-03-29 LAB — CBC WITH DIFFERENTIAL/PLATELET
BASO%: 0.8 % (ref 0.0–2.0)
EOS%: 0.4 % (ref 0.0–7.0)
HCT: 27.7 % — ABNORMAL LOW (ref 34.8–46.6)
HGB: 9.6 g/dL — ABNORMAL LOW (ref 11.6–15.9)
LYMPH%: 20.1 % (ref 14.0–49.7)
MCH: 31.7 pg (ref 25.1–34.0)
MCHC: 34.7 g/dL (ref 31.5–36.0)
MCV: 91.2 fL (ref 79.5–101.0)
MONO#: 0.4 10*3/uL (ref 0.1–0.9)
MONO%: 14.5 % — ABNORMAL HIGH (ref 0.0–14.0)
NEUT%: 64.2 % (ref 38.4–76.8)
Platelets: 163 10*3/uL (ref 145–400)
RBC: 3.04 10*6/uL — ABNORMAL LOW (ref 3.70–5.45)
WBC: 2.8 10*3/uL — ABNORMAL LOW (ref 3.9–10.3)

## 2013-03-29 LAB — COMPREHENSIVE METABOLIC PANEL (CC13)
ALT: 60 U/L — ABNORMAL HIGH (ref 0–55)
AST: 45 U/L — ABNORMAL HIGH (ref 5–34)
Albumin: 3.1 g/dL — ABNORMAL LOW (ref 3.5–5.0)
Alkaline Phosphatase: 121 U/L (ref 40–150)
Anion Gap: 11 mEq/L (ref 3–11)
CO2: 27 mEq/L (ref 22–29)
Chloride: 107 mEq/L (ref 98–109)
Creatinine: 1.3 mg/dL — ABNORMAL HIGH (ref 0.6–1.1)
Total Bilirubin: 1.09 mg/dL (ref 0.20–1.20)

## 2013-03-29 MED ORDER — EXEMESTANE 25 MG PO TABS: 25.0000 mg | ORAL_TABLET | Freq: Every day | ORAL | Status: DC

## 2013-03-29 MED ORDER — EVEROLIMUS 10 MG PO TABS: 10.0000 mg | ORAL_TABLET | Freq: Every day | ORAL | Status: DC

## 2013-03-29 NOTE — Progress Notes (Signed)
[facility]  [phone] [phone]  OFFICE PROGRESS NOTE   ID: Rebecca Pugh   DOB: 20-Jan-1951  MR#: [id number]  JYN#:[id number]   PCP: Rebecca Bur, MD SU: Rebecca Kelp, MD URO: Rebecca Simmer. Nesi, MD   HISTORY OF PRESENT ILLNESS: The patient and her husband, Rebecca Pugh, moved back to this area in 2007 from [facility] (Rebecca Pugh actually is a native of [place] and incidentally remains a Herbalist).  As part of moving boxes, [name] felt something under her left arm.  She brought it to Dr. Lovena Pugh attention and although the patient states that she has always had some cyclical swelling of her left axilla during menstruation, Dr. Lalla Pugh set the patient up for evaluation at [facility].  On 12-12-2005 the patient had a mammogram and ultrasound there and these showed no mass, distortion or microcalcifications in either breast however, in the left axilla there was a large macrolobulated mass and just above that there was a slightly prominent lymph node which did demonstrate a fatty hilum.  Physically on exam the mass was mobile and nontender and measured approximately 6.5 cm.  The ultrasound showed two small hypoechoic nodules in the left breast 3 cm. from the nipple measuring 7 mm. each and felt to be most likely benign however, the axillary mass, of course, was quite suspicious and biopsy was performed the same day. This showed ([id number] and [id number]) invasive breast cancer which was strongly ER positive at 76%, moderately PR positive at 25% with a very high proliferation marker at 66%.  HER-2/neu was 1+ and FISH showed no amplification with a ratio of 1.1.    With this information, the patient was referred to Dr. Claud Pugh and on 12-20-05 the patient had bilateral breast MRI's.  In the left breast there was a 5.8 cm. axillary mass and several abnormally enlarged lymph nodes.  There was a 7 mm. left supraclavicular lymph node and the two nodules in the breast that  had been noted by ultrasound were also noted by MRI measuring 1.1 and 0.7 cm.  In the right breast there were no abnormalities noted.  MR guided biopsies of the left breast masses were obtained on 12-27-05 and both showed high grade ductal carcinoma.  There was evidence of lymphovascular invasion and high grade ductal carcinoma in situ as well ([id number]).    Dr. Derrell Pugh discussed the situation with Dr. Darnelle Pugh and they felt it would be useful to have a PET scan prior to definitive surgery.  This was obtained on 12-30-2005 and showed the primary axillary mass to have an SUV of 8.5.  The lymph nodes in the left axilla had SUV's in the 2.2 range, small focus of increased activity in the left breast had an SUV of 2.3.  There was also skin thickening with mildly increased diffuse FDG uptake within the skin suggesting an inflammatory breast cancer.  The neck, abdomen and  pelvis were negative.  In the lung windows, there were several scattered small areas of focal nodular density in the upper lobes with compressive atelectasis and this was felt to be benign.  Chest x-ray on 01-09-2006 was negative.  With this information, Dr. Derrell Pugh proceeded, after appropriate discussion, to left modified radical mastectomy on 01-15-2006 and the insertion of a BARD port at the same time.  The final pathology ([id number]) showed the maximum tumor size to be 5 cm.  Margins were ample.  There was evidence of lymphovascular invasion.  This infiltrating ductal carcinoma was  a grade 3 of 3 and 11 out of 14 lymph nodes were involved.  There was evidence of extracapsular extension.  Her subsequent history is as detailed below.   INTERVAL HISTORY: Rebecca Pugh returns today with her husband Rebecca Pugh for followup of Rebecca Pugh's breast cancer. Since her last visit here she was admitted to [facility] by Dr. Brunilda Pugh and she had her ureteral stents replaced. This to care of the significant pain and discomfort she was having from the prior stents. It  also has normalized her creatinine. Since her last visit here she also had a restaging abdominal MRI, which shows evidence of disease progression. An echocardiogram shows a well preserved ejection fraction on the lapatinib   REVIEW OF SYSTEMS: Rebecca Pugh has lost some weight. She just doesn't have an appetite. Food doesn't taste particularly good. She prefers salty to sweet things these days. Then the dizzy weekend, without town guests and a lot of family visiting. There have been no unusual headaches, visual changes, or dizziness. She has mild nausea. This is more less persistent. She is taking metoclopramide 5 mg occasionally and it helps. There has been no vomiting. She denies cough, phlegm production, pleurisy, or change in bowel or bladder habits. A detailed review of systems was otherwise noncontributory  PAST MEDICAL HISTORY: Past Medical History  Diagnosis Date  . Hypertension   . Depression   . Hydronephrosis, bilateral   . Breast cancer metastasized to multiple sites POSITIVE METS IN 2012-----  ONCOLOGIST--  DR Rebecca Pugh--  CURRENTLY RECEIVING CHEMO  EVERY 3 WEEKS (STARTED 06-15-2012)    FIRST DX 01-15-2006, INVASIVE DUCTAL GRADE III/    S/P LEFT MASTECTOMY AND CHEMORADIATION (CHEMO COMPLETE NOV 2007 AND XRT COMPLETE FEB 2008)  . Acute renal failure     PAST SURGICAL HISTORY: Past Surgical History  Procedure Laterality Date  . Orif ankle fracture  01/31/2012    Procedure: OPEN REDUCTION INTERNAL FIXATION (ORIF) ANKLE FRACTURE;  Surgeon: Rebecca Drilling, MD;  Location: [facility];  Service: Orthopedics;  Laterality: Right;  . Port-a-cath placement  12-05-2010  . Mastectomy modified radical Left 01-15-2006    W/ LYMPH NODE DISSECTIONS AND PORT-A-CATH PLACEMENT (REMOVAL 04-06-2007)  . Transthoracic echocardiogram  09-08-2012    MILD LVF/  NORMAL LVSF/ EF 60-65%  . Cholecystectomy  1977    AND APPENDECTOMY  . Tonsillectomy  AS CHILD  . Cystoscopy w/ ureteral stent placement Bilateral  09/11/2012    Procedure: CYSTOSCOPY WITH RETROGRADE PYELOGRAM/URETERAL STENT PLACEMENT;  Surgeon: Lindaann Slough, MD;  Location: [facility];  Service: Urology;  Laterality: Bilateral;  1 HR   . Transthoracic echocardiogram  09-08-2012    mild lvf/ lvsf normal/ ef 60-65%  . Cystoscopy with stent placement N/A 10/20/2012    Procedure: CYSTOSCOPY, bilateral retrogrades WITH bilateral  STENT RE PLACEMENTs;  Surgeon: Lindaann Slough, MD;  Location: [facility];  Service: Urology;  Laterality: N/A;  BILATERAL JJ STENT PLACEMENT   . Cystoscopy w/ ureteral stent placement Bilateral 01/15/2013    Procedure: CYSTOSCOPY WITH STENT REPLACEMENT;  Surgeon: Lindaann Slough, MD;  Location: [facility] [place];  Service: Urology;  Laterality: Bilateral;  . Stent removal Bilateral 03/12/2013    Ureteral stent  . Appendectomy    . Skin biopsy      Simple keratoses  . Cystoscopy w/ ureteral stent placement Bilateral 03/16/2013    Procedure: CYSTOSCOPY WITH RETROGRADE PYELOGRAM/URETERAL STENT PLACEMENT;  Surgeon: Lindaann Slough, MD;  Location: [facility];  Service: Urology;  Laterality: Bilateral;  FAMILY HISTORY:  Family History  Problem Relation Age of Onset  . COPD Mother   . Cancer Mother     Bladder cancer  . Healthy Sister   The patient has no information regarding her father. She was brought up by her stepfather. The patient's mother died at the age of 70 with bladder cancer. The patient has one sister who is in good health. There is no history of breast or ovarian cancer in the family to the patient's knowledge.    GYNECOLOGIC HISTORY: She is G2, P2, age of menses 8, age parity 82, age of menopause 64.   SOCIAL HISTORY: She and her husband, Rebecca Pugh, have been married 10 years. 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 She has two children, a daughter, [name], who lives in [place] and is  a Investment banker, corporate, and a son, [name], also in [place], who works for [employer]. She has two grandchildren and three step-grandchildren from her own two children, [name] and [name], who are from her first marriage. Rebecca Pugh has one daughter from his first marriage and he has grandchildren through her. The patient was brought up as a Catholic but currently is not practicing. She and Rebecca Pugh are attending a [employer].     ADVANCED DIRECTIVES: Not on file   HEALTH MAINTENANCE: History  Substance Use Topics  . Smoking status: Former Smoker -- 20 years    Quit date: 01/31/1995  . Smokeless tobacco: Never Used  . Alcohol Use: Yes     Comment: Occasional     Colonoscopy:  Not on file  PAP: Not on file  Bone density: August 2012, "Normal"  Lipid panel: Not on file  No Known Allergies  Current Outpatient Prescriptions  Medication Sig Dispense Refill  . calcium citrate (CALCITRATE - DOSED IN MG ELEMENTAL CALCIUM) 950 MG tablet Take 1 tablet by mouth daily.       . cholecalciferol (VITAMIN D) 1000 UNITS tablet Take 3,000 Units by mouth daily.       Marland Kitchen everolimus (AFINITOR) 10 MG tablet Take 1 tablet (10 mg total) by mouth daily.  30 tablet  4  . exemestane (AROMASIN) 25 MG tablet Take 1 tablet (25 mg total) by mouth daily after breakfast.  90 tablet  4  . FLUoxetine (PROZAC) 40 MG capsule TAKE ONE CAPSULE BY MOUTH ONCE DAILY  30 capsule  6  . gabapentin (NEURONTIN) 300 MG capsule Take 1 capsule (300 mg total) by mouth at bedtime.  30 capsule  5  . lapatinib (TYKERB) 250 MG tablet Take 4 tablets (1,000 mg total) by mouth daily.  120 tablet  3  . letrozole (FEMARA) 2.5 MG tablet Take 2.5 mg by mouth daily.       Marland Kitchen lidocaine-prilocaine (EMLA) cream Apply topically as needed.  30 g  3  . loperamide (IMODIUM) 2 MG capsule Take 2 mg by mouth 4 (four) times daily as needed. diarrhea      . LORazepam (ATIVAN) 0.5 MG tablet Take 1 tablet (0.5 mg total) by mouth 2 (two) times daily as needed.   60 tablet  0  . metoCLOPramide (REGLAN) 10 MG tablet Take 0.5 tablets (5 mg total) by mouth 4 (four) times daily -  before meals and at bedtime.  120 tablet  12  . mirabegron ER (MYRBETRIQ) 25 MG TB24 tablet Take 1 tablet (25 mg total) by mouth daily.  30 tablet  0  . omeprazole (PRILOSEC) 40 MG capsule Take 1 capsule (40 mg total) by mouth at bedtime.  30 capsule  12  . ondansetron (ZOFRAN) 8 MG tablet Take 8 mg by mouth 2 (two) times daily as needed for nausea.      Marland Kitchen oxyCODONE (OXY IR/ROXICODONE) 5 MG immediate release tablet Take 1 tablet (5 mg total) by mouth every 6 (six) hours as needed.  30 tablet  0  . predniSONE (DELTASONE) 5 MG tablet TAKE ONE TABLET BY MOUTH ONCE DAILY.  TAKE FOR 4 DAYS AFTER DISCONTINUING DEXAMETHASONE.  30 tablet  0  . prochlorperazine (COMPAZINE) 10 MG tablet Take 1 tablet (10 mg total) by mouth every 6 (six) hours as needed.  30 tablet  3   No current facility-administered medications for this visit.   Facility-Administered Medications Ordered in Other Visits  Medication Dose Route Frequency Provider Last Rate Last Dose  . heparin lock flush 100 unit/mL  500 Units Intravenous Once Lowella Dell, MD      . sodium chloride 0.9 % injection 10 mL  10 mL Intravenous PRN Lowella Dell, MD         OBJECTIVE:  Middle-aged white woman who appears mildly mild urged Filed Vitals:   03/29/13 1415  BP: 107/69  Pulse: 85  Temp: 98.1 F (36.7 C)  Resp: 18  Body mass index is 21.6 kg/(m^2). Filed Weights   03/29/13 1415  Weight: 125 lb 14.4 oz (57.108 kg)   ECOG FS: 1 - Symptomatic but completely ambulatory  Sclerae unicteric, pupils are equal round and reactive, EOMs are intact Oropharynx shows no thrush or other lesions No cervical or supraclavicular adenopathy Lungs no rales or rhonchi Heart regular rate and rhythm Abd soft, nontender, positive bowel sounds MSK no focal spinal tenderness, no peripheral edema Neuro: nonfocal, well oriented,  appropriate affect Breasts: Deferred    LAB RESULTS:  Lab Results  Component Value Date   WBC 2.8* 03/29/2013   NEUTROABS 1.8 03/29/2013   HGB 9.6* 03/29/2013   HCT 27.7* 03/29/2013   MCV 91.2 03/29/2013   PLT 163 03/29/2013      Chemistry      Component Value Date/Time   NA 145 03/29/2013 1400   NA 134* 03/18/2013 0940   NA 142 11/05/2010 1036   K 3.6 03/29/2013 1400   K 3.6 03/18/2013 0940   K 4.5 11/05/2010 1036   CL 100 03/18/2013 0940   CL 99 11/30/2012 0835   CL 99 11/05/2010 1036   CO2 27 03/29/2013 1400   CO2 24 03/18/2013 0940   CO2 26 11/05/2010 1036   BUN 11.8 03/29/2013 1400   BUN 25* 03/18/2013 0940   BUN 14 11/05/2010 1036   CREATININE 1.3* 03/29/2013 1400   CREATININE 1.87* 03/18/2013 0940   CREATININE 0.8 11/05/2010 1036      Component Value Date/Time   CALCIUM 8.6 03/29/2013 1400   CALCIUM 8.2* 03/18/2013 0940   CALCIUM 8.8 11/05/2010 1036   ALKPHOS 121 03/29/2013 1400   ALKPHOS 88 01/08/2012 0916   ALKPHOS 106* 11/05/2010 1036   AST 45* 03/29/2013 1400   AST 24 01/08/2012 0916   AST 27 11/05/2010 1036   ALT 60* 03/29/2013 1400   ALT 27 01/08/2012 0916   ALT 35 11/05/2010 1036   BILITOT 1.09 03/29/2013 1400   BILITOT 0.9 01/08/2012 0916   BILITOT 0.80 11/05/2010 1036        STUDIES: Mr Liver W Wo Contrast  04-18-13  CLINICAL DATA:  History of breast cancer with widespread metastatic disease.  EXAM: MRI ABDOMEN WITHOUT AND WITH CONTRAST  TECHNIQUE: Multiplanar, multisequence MR imaging was performed both before and after administration of intravenous contrast.  CONTRAST:  6mL MULTIHANCE GADOBENATE DIMEGLUMINE 529 MG/ML IV SOLN  COMPARISON:  PET 02/03/2013. CT 02/11/2013. Most recent MRI of 07/22/2006  FINDINGS: Normal heart size without pericardial or pleural effusion. Retrocrural metastasis within node measuring 1.3 cm on image 15/ series 3. 1.5 cm on 02/11/2013.  Given cross modality comparison, slight progression of hepatic metastatic burden. For example, a  lesion in the high left lobe of the liver measures 3.2 x 3.3 cm on image 16/ series 3 and compares with 2.6 x 2.3 cm on the prior exam.  The more cephalad right liver lobe 3.1 x 3.0 cm lesion on image 14/ series 3 compares with 2.5 x 2.3 cm on the prior.  A 2.4 x 1.8 cm inferior right hepatic lobe lesion on image 31/ series 3 was only subtly apparent on the prior CT.  Normal spleen, stomach, pancreas. Left adrenal metastasis measures 5.5 x 4.9 cm on image 49/ series 7 and is similar to 5.6 x 4.8 cm on the 02/11/2013 CT.  Retroperitoneal adenopathy. A preaortic index node measures 2.4 x 2.8 cm on image 35/series 3. Decreased from 2.7 x 3.7 cm on the prior exam.  A low retroperitoneal node adjacent the bifurcating aorta measures 3.8 cm on image 81/ series 1102. 4.0 cm on the prior.  The retroperitoneal adenopathy causes narrowing of the IVC, including on image 64/ series 13. No thrombus within. There is mild to moderate bilateral hydronephrosis with probable ureteric stents in place. No evidence of bowel obstruction or ascites.  IMPRESSION: 1. Slight progression of hepatic metastasis since 01/2013. Evaluation slightly degraded by across modality comparison. 2. Similar left adrenal metastasis. 3. Minimal improvement in retroperitoneal and retrocrural nodal metastasis. The retroperitoneal adenopathy causes marked narrowing of the IVC. 4. Similar bilateral hydronephrosis.   Electronically Signed   By: Jeronimo Greaves M.D.   On: 03/25/2013 13:57   US Renal  03/15/2013   CLINICAL DATA:  Urinary retention.  EXAM: RENAL/URINARY TRACT ULTRASOUND COMPLETE  COMPARISON:  CT 02/11/2013  FINDINGS: Right Kidney  Length: 10.7 cm. Mild right hydronephrosis. No focal abnormality. Normal echotexture.  Left Kidney  Length: 11.1 cm. Slight hydronephrosis. No focal abnormality. Normal echotexture.  Bladder:  Bladder is not visualized, decompressed.  IMPRESSION: Mild hydronephrosis bilaterally, right slightly greater than left.  Nonvisualization of the bladder which is decompressed.   Electronically Signed   By: Charlett Nose M.D.   On: 03/15/2013 23:47      ASSESSMENT: 62 y.o.  [place] with stage IV breast cancer:  1. 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  2. Status post 4 cycles of adjuvant docetaxel, carboplatin and trastuzumab completed November 2007.  3. Postmastectomy radiation completed February 2008.  4. Anastrozole begun February 2008 (the trastuzumab also was continued to complete a year), continued until June 2012.  5. 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 with left retrocrural and retroperitoneal adenopathy.   6. On tamoxifen/lapatinib/trastuzumab between June 2012 and October 2012.   7. Ixempra/lapatinib/trastuzumab started October 2012, the Ixempra discontinued in February 2013 due to peripheral neuropathy.  8. Continuing on lapatinib/trastuzumab with the addition of fulvestrant, first injections given on 07/31/2011. Lapatinib given at a dose of 1 g (4 tablets) daily, and trastuzumab given at 8 mg/kg every 4 weeks to coordinate with injection appointments.  9. Discontinued lapatinib and fulvestrant in December 2013.  10. Started q. three-week docetaxel/ trastuzumab/ pertuzumab, first dose given 06/15/2012.  Docetaxel given at a 50% dose reduction beginning with cycle 2 due to poor tolerance and severe neutropenia. Completed 6 cycles as of 09/28/2012.   11. Trastuzumab/pertuzumab continued  to June of 2014, with evidence of disease progression. Letrozole added 10/19/2012.  12. Bilateral hydronephrosis noted March 2014, bilateral ureteral stenting performed 09/11/2012 under Dr Rebecca Pugh.  13. Herpes Zoster June 2014,  resolved.  14. 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  An echocardiogram on 12/07/2012 showed a reduced ejection fraction of 40%, likely secondary to previous treatment with trastuzumab. Subsequently, TDM-1 was discontinued.  15. Started carboplatin/gemcitabine, both agents given on days one and 8 of each 21 day cycle, first dose on 12/21/2012.  Carboplatin given at an AUC of 2, the gemcitabine at 1000 mg per meter square.  Discontinued October 2014 witrh evidence of progression   16. Resumed lapatinib August of 2014; most recent echocardiogram 03/26/2013 shows a well preserved ejection fraction  17. Exemestane and everolimus started October 2014      PLAN:  We discussed Maddilyn situation today. Even though the largest liver lesion is only grown by 6 mm, that is still over 20% growth in 3 months, and therefore certainly cancer is progression. Accordingly we need to make a change in her treatment.  She is tolerating the lapatinib well, and we are going to continue that at least for now for her-2 receptor blockade.  The main  problem with that drug is cost. We are going to see if a different provider can perhaps obtain it for them at a better price. At this point though she has an additional 3 weeks' on hand so continuing is not an issue.  She has not had exemestane, and we are going to combine that with everolimus, which has been shown to increase response rate in this setting. We discussed the possible toxicities, side effects and complications of both drugs in detail, and particularly the possibility of mucositis or pneumonitis from the everolimus. She will start at 10 mg daily and we will adjust her dose as tolerated.  If this combination does not work (we will repeat an MRI of the abdomen in 2-3 months to find out) she may benefit from the BYL/ capecitabine study at [facility] or we could consider Abraxane, Doxil, or eribulin. As we continue to move down the spectrum of salvage therapies,  though, it is increasingly worrisome that we are unable to obtain a good response or even stable disease.   She will see me again in 3 weeks. She knows to call for any problems that may develop before that visit.  Lowella Dell MD  03/29/2013  2:44 PM

## 2013-03-29 NOTE — Telephone Encounter (Signed)
, °

## 2013-03-30 ENCOUNTER — Encounter: Payer: Self-pay | Admitting: Oncology

## 2013-03-30 ENCOUNTER — Telehealth: Payer: Self-pay | Admitting: *Deleted

## 2013-03-30 NOTE — Progress Notes (Signed)
Called and spoke with patient about her [employer] her copay is 1600.00. I advised her of possible asst once our phar can run the price and see if we can get it here cheaper than [facility]. See previous notes.

## 2013-03-30 NOTE — Telephone Encounter (Signed)
sw pt gv appt for 11.3.14 w/labs @ 10am and ov @ 10:30am. Pt is aware...td

## 2013-04-01 ENCOUNTER — Other Ambulatory Visit: Payer: Self-pay | Admitting: *Deleted

## 2013-04-01 MED ORDER — LAPATINIB DITOSYLATE 250 MG PO TABS: 1000.0000 mg | ORAL_TABLET | Freq: Every day | ORAL | Status: DC

## 2013-04-01 MED ORDER — EXEMESTANE 25 MG PO TABS: 25.0000 mg | ORAL_TABLET | Freq: Every day | ORAL | Status: DC

## 2013-04-01 NOTE — Telephone Encounter (Signed)
This RN spoke with pt per her call regarding concern for cost of new prescriptions.  Sharee states the [employer] co pay is 1200 dollars and the exermustane is 230 dollars.  Per discussion pt's prescriptions will be sent to the [facility] pharmacy for cost concerns as well as pt is applying for medication assistance in [facility] and needs hard copies of these prescriptions as well.  Prescriptions will be mailed to pt.

## 2013-04-05 ENCOUNTER — Encounter: Payer: Self-pay | Admitting: Oncology

## 2013-04-05 NOTE — Progress Notes (Signed)
Per [employer] Asst. This patient's medicare part D will cover [employer]. Application was sent to asst with [employer].

## 2013-04-19 ENCOUNTER — Ambulatory Visit (HOSPITAL_BASED_OUTPATIENT_CLINIC_OR_DEPARTMENT_OTHER): Payer: Medicare Other | Admitting: Oncology

## 2013-04-19 ENCOUNTER — Telehealth: Payer: Self-pay | Admitting: *Deleted

## 2013-04-19 ENCOUNTER — Other Ambulatory Visit (HOSPITAL_BASED_OUTPATIENT_CLINIC_OR_DEPARTMENT_OTHER): Payer: Medicare Other | Admitting: Lab

## 2013-04-19 VITALS — BP 105/70 | HR 94 | Temp 98.1°F | Resp 18 | Ht 64.0 in | Wt 123.2 lb

## 2013-04-19 DIAGNOSIS — C773 Secondary and unspecified malignant neoplasm of axilla and upper limb lymph nodes: Secondary | ICD-10-CM

## 2013-04-19 DIAGNOSIS — C7970 Secondary malignant neoplasm of unspecified adrenal gland: Secondary | ICD-10-CM

## 2013-04-19 DIAGNOSIS — C50219 Malignant neoplasm of upper-inner quadrant of unspecified female breast: Secondary | ICD-10-CM

## 2013-04-19 DIAGNOSIS — M216X9 Other acquired deformities of unspecified foot: Secondary | ICD-10-CM

## 2013-04-19 DIAGNOSIS — N1330 Unspecified hydronephrosis: Secondary | ICD-10-CM

## 2013-04-19 DIAGNOSIS — C50912 Malignant neoplasm of unspecified site of left female breast: Secondary | ICD-10-CM

## 2013-04-19 DIAGNOSIS — C50919 Malignant neoplasm of unspecified site of unspecified female breast: Secondary | ICD-10-CM

## 2013-04-19 DIAGNOSIS — R569 Unspecified convulsions: Secondary | ICD-10-CM

## 2013-04-19 DIAGNOSIS — C787 Secondary malignant neoplasm of liver and intrahepatic bile duct: Secondary | ICD-10-CM

## 2013-04-19 LAB — COMPREHENSIVE METABOLIC PANEL (CC13)
Anion Gap: 15 mEq/L — ABNORMAL HIGH (ref 3–11)
BUN: 17.4 mg/dL (ref 7.0–26.0)
CO2: 23 mEq/L (ref 22–29)
Calcium: 9.9 mg/dL (ref 8.4–10.4)
Chloride: 105 mEq/L (ref 98–109)
Creatinine: 1.5 mg/dL — ABNORMAL HIGH (ref 0.6–1.1)
Total Protein: 7.1 g/dL (ref 6.4–8.3)

## 2013-04-19 LAB — CBC WITH DIFFERENTIAL/PLATELET
Basophils Absolute: 0 10*3/uL (ref 0.0–0.1)
EOS%: 3.7 % (ref 0.0–7.0)
HCT: 27.9 % — ABNORMAL LOW (ref 34.8–46.6)
HGB: 9.4 g/dL — ABNORMAL LOW (ref 11.6–15.9)
LYMPH%: 20.1 % (ref 14.0–49.7)
MCH: 31.1 pg (ref 25.1–34.0)
MCV: 92.8 fL (ref 79.5–101.0)
MONO#: 0.4 10*3/uL (ref 0.1–0.9)
NEUT%: 65.3 % (ref 38.4–76.8)
Platelets: 116 10*3/uL — ABNORMAL LOW (ref 145–400)
RBC: 3.01 10*6/uL — ABNORMAL LOW (ref 3.70–5.45)
lymph#: 0.7 10*3/uL — ABNORMAL LOW (ref 0.9–3.3)

## 2013-04-19 MED ORDER — LAPATINIB DITOSYLATE 250 MG PO TABS: 1000.0000 mg | ORAL_TABLET | Freq: Every day | ORAL | Status: DC

## 2013-04-19 MED ORDER — EXEMESTANE 25 MG PO TABS: 25.0000 mg | ORAL_TABLET | Freq: Every day | ORAL | Status: DC

## 2013-04-19 NOTE — Telephone Encounter (Signed)
appts made and printed. Pt is aware that cs will call for her MRI'S appts...td

## 2013-04-19 NOTE — Progress Notes (Signed)
[facility]  [phone] [phone]  OFFICE PROGRESS NOTE   ID: Rebecca Pugh   DOB: [birth year]  MR#: [id number]  FAO#:[id number]   PCP: [name], MD SU: [name], MD URO: Wendie Simmer. Nesi, MD   HISTORY OF PRESENT ILLNESS: The patient and her husband, Rebecca Pugh, moved back to this area in 2007 from [facility] (Rebecca Pugh actually is a native of [place] and incidentally remains a Herbalist).  As part of moving boxes, Rebecca Pugh felt something under her left arm.  She brought it to [name] attention and although the patient states that she has always had some cyclical swelling of her left axilla during menstruation, [name] set the patient up for evaluation at [facility].  On 12-12-2005 the patient had a mammogram and ultrasound there and these showed no mass, distortion or microcalcifications in either breast however, in the left axilla there was a large macrolobulated mass and just above that there was a slightly prominent lymph node which did demonstrate a fatty hilum.  Physically on exam the mass was mobile and nontender and measured approximately 6.5 cm.  The ultrasound showed two small hypoechoic nodules in the left breast 3 cm. from the nipple measuring 7 mm. each and felt to be most likely benign however, the axillary mass, of course, was quite suspicious and biopsy was performed the same day. This showed ([id number] and [id number]) invasive breast cancer which was strongly ER positive at 76%, moderately PR positive at 25% with a very high proliferation marker at 66%.  HER-2/neu was 1+ and FISH showed no amplification with a ratio of 1.1.    With this information, the patient was referred to Dr. [name] and on 12-20-05 the patient had bilateral breast MRI's.  In the left breast there was a 5.8 cm. axillary mass and several abnormally enlarged lymph nodes.  There was a 7 mm. left supraclavicular lymph node and the two nodules in the breast that  had been noted by ultrasound were also noted by MRI measuring 1.1 and 0.7 cm.  In the right breast there were no abnormalities noted.  MR guided biopsies of the left breast masses were obtained on 12-27-05 and both showed high grade ductal carcinoma.  There was evidence of lymphovascular invasion and high grade ductal carcinoma in situ as well ([id number]).    [name] discussed the situation with [name] and they felt it would be useful to have a PET scan prior to definitive surgery.  This was obtained on 12-30-2005 and showed the primary axillary mass to have an SUV of 8.5.  The lymph nodes in the left axilla had SUV's in the 2.2 range, small focus of increased activity in the left breast had an SUV of 2.3.  There was also skin thickening with mildly increased diffuse FDG uptake within the skin suggesting an inflammatory breast cancer.  The neck, abdomen and  pelvis were negative.  In the lung windows, there were several scattered small areas of focal nodular density in the upper lobes with compressive atelectasis and this was felt to be benign.  Chest x-ray on 01-09-2006 was negative.  With this information, [name] proceeded, after appropriate discussion, to left modified radical mastectomy on 01-15-2006 and the insertion of a BARD port at the same time.  The final pathology ([id number]) showed the maximum tumor size to be 5 cm.  Margins were ample.  There was evidence of lymphovascular invasion.  This infiltrating ductal carcinoma was  a grade 3 of 3 and 11 out of 14 lymph nodes were involved.  There was evidence of extracapsular extension.  Her subsequent history is as detailed below.   INTERVAL HISTORY: Rebecca Pugh returns today for followup of her breast cancer, accompanied by her husband Rebecca Pugh. She is now on everolimus, which she started October 22, and exemestane which she started October 14. The everolimus is being mailed to her and she has been referred to a grant program to be able to afford it.  The exemestane she buys directly, but it is causing her more than $200 a month. The lapatinib is $600 this month but it will be again $1600 a month beginning in January.  REVIEW OF SYSTEMS: Rebecca Pugh had an unusual experience in last tragus, 3 days after starting her lapatinib. She had had breakfast, when Dr. Bluford Kaufmann tells room, and was making the bed but felt a little dizzy so she lay down. Her husband Rebecca Pugh came in at some point and found her with her arms and legs flexed and shaking. She appeared to be unconscious. He woke drop, and incidentally the shaking stopped. She seemed a little dizzy while waking up, but not obviously postictal. 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 She does have a brace in place. She does not give me any symptoms of mucositis or pneumonitis. She usually has one bowel movement a day, but sometimes she can have several depending on perhaps diet. The lapatinib dose of course does not change. She's not having any problems with rash. Overall she is tolerating her medications remarkably well.  PAST MEDICAL HISTORY: Past Medical History  Diagnosis Date  . Hypertension   . Depression   . Hydronephrosis, bilateral   . Breast cancer metastasized to multiple sites POSITIVE METS IN 2012-----  ONCOLOGIST--  DR Darnelle Catalan--  CURRENTLY RECEIVING CHEMO  EVERY 3 WEEKS (STARTED 06-15-2012)    FIRST DX 01-15-2006, INVASIVE DUCTAL GRADE III/    S/P LEFT MASTECTOMY AND CHEMORADIATION (CHEMO COMPLETE NOV 2007 AND XRT COMPLETE FEB 2008)  . Acute renal failure     PAST SURGICAL HISTORY: Past Surgical History  Procedure Laterality Date  . Orif ankle fracture  01/31/2012    Procedure: OPEN REDUCTION INTERNAL FIXATION (ORIF) ANKLE FRACTURE;  Surgeon: Loanne Drilling, MD;  Location: [facility];  Service: Orthopedics;  Laterality: Right;  . Port-a-cath placement  12-05-2010  . Mastectomy  modified radical Left 01-15-2006    W/ LYMPH NODE DISSECTIONS AND PORT-A-CATH PLACEMENT (REMOVAL 04-06-2007)  . Transthoracic echocardiogram  09-08-2012    MILD LVF/  NORMAL LVSF/ EF 60-65%  . Cholecystectomy  1977    AND APPENDECTOMY  . Tonsillectomy  AS CHILD  . Cystoscopy w/ ureteral stent placement Bilateral 09/11/2012    Procedure: CYSTOSCOPY WITH RETROGRADE PYELOGRAM/URETERAL STENT PLACEMENT;  Surgeon: Lindaann Slough, MD;  Location: [facility];  Service: Urology;  Laterality: Bilateral;  1 HR   . Transthoracic echocardiogram  09-08-2012    mild lvf/ lvsf normal/ ef 60-65%  . Cystoscopy with stent placement N/A 10/20/2012    Procedure: CYSTOSCOPY, bilateral retrogrades WITH bilateral  STENT RE PLACEMENTs;  Surgeon: Lindaann Slough, MD;  Location: [facility];  Service: Urology;  Laterality: N/A;  BILATERAL JJ STENT PLACEMENT   . Cystoscopy w/ ureteral stent placement Bilateral 01/15/2013    Procedure: CYSTOSCOPY WITH STENT REPLACEMENT;  Surgeon: Lindaann Slough,  MD;  Location: [facility];  Service: Urology;  Laterality: Bilateral;  . Stent removal Bilateral 03/12/2013    Ureteral stent  . Appendectomy    . Skin biopsy      Simple keratoses  . Cystoscopy w/ ureteral stent placement Bilateral 03/16/2013    Procedure: CYSTOSCOPY WITH RETROGRADE PYELOGRAM/URETERAL STENT PLACEMENT;  Surgeon: Lindaann Slough, MD;  Location: [facility];  Service: Urology;  Laterality: Bilateral;     FAMILY HISTORY:  Family History  Problem Relation Age of Onset  . COPD Mother   . Cancer Mother     Bladder cancer  . Healthy Sister   The patient has no information regarding her father. She was brought up by her stepfather. The patient's mother died at the age of 11 with bladder cancer. The patient has one sister who is in good health. There is no history of breast or ovarian cancer in the family to the patient's knowledge.    GYNECOLOGIC HISTORY: She is G2, P2, age  of menses 84, age parity 25, age of menopause 33.   SOCIAL HISTORY: She and her husband, Rebecca Pugh, have been married 10 years. 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 She has two children, a daughter, Rebecca Pugh, who lives in [place] and is a Investment banker, corporate, and a son, Rebecca Pugh, also in [place], who works for [employer]. She has two grandchildren and three step-grandchildren from her own two children, Rebecca Pugh and Rebecca Pugh, who are from her first marriage. Rebecca Pugh has one daughter from his first marriage and he has grandchildren through her. The patient was brought up as a Catholic but currently is not practicing. She and Rebecca Pugh are attending a [employer].     ADVANCED DIRECTIVES: Not on file   HEALTH MAINTENANCE: History  Substance Use Topics  . Smoking status: Former Smoker -- 20 years    Quit date: 01/31/1995  . Smokeless tobacco: Never Used  . Alcohol Use: Yes     Comment: Occasional     Colonoscopy:  Not on file  PAP: Not on file  Bone density: August 2012, "Normal"  Lipid panel: Not on file  No Known Allergies  Current Outpatient Prescriptions  Medication Sig Dispense Refill  . calcium citrate (CALCITRATE - DOSED IN MG ELEMENTAL CALCIUM) 950 MG tablet Take 1 tablet by mouth daily.       . cholecalciferol (VITAMIN D) 1000 UNITS tablet Take 3,000 Units by mouth daily.       Marland Kitchen everolimus (AFINITOR) 10 MG tablet Take 1 tablet (10 mg total) by mouth daily.  30 tablet  4  . exemestane (AROMASIN) 25 MG tablet Take 1 tablet (25 mg total) by mouth daily after breakfast.  90 tablet  4  . FLUoxetine (PROZAC) 40 MG capsule TAKE ONE CAPSULE BY MOUTH ONCE DAILY  30 capsule  6  . gabapentin (NEURONTIN) 300 MG capsule Take 1 capsule (300 mg total) by mouth at bedtime.  30 capsule  5  . lapatinib (TYKERB) 250 MG tablet Take 4 tablets (1,000 mg total) by mouth daily.  120 tablet  3  . letrozole (FEMARA) 2.5 MG  tablet Take 2.5 mg by mouth daily.       Marland Kitchen lidocaine-prilocaine (EMLA) cream Apply topically as needed.  30 g  3  . loperamide (IMODIUM) 2 MG capsule Take 2 mg by mouth 4 (four) times daily as needed. diarrhea      .  LORazepam (ATIVAN) 0.5 MG tablet Take 1 tablet (0.5 mg total) by mouth 2 (two) times daily as needed.  60 tablet  0  . metoCLOPramide (REGLAN) 10 MG tablet Take 0.5 tablets (5 mg total) by mouth 4 (four) times daily -  before meals and at bedtime.  120 tablet  12  . mirabegron ER (MYRBETRIQ) 25 MG TB24 tablet Take 1 tablet (25 mg total) by mouth daily.  30 tablet  0  . omeprazole (PRILOSEC) 40 MG capsule Take 1 capsule (40 mg total) by mouth at bedtime.  30 capsule  12  . ondansetron (ZOFRAN) 8 MG tablet Take 8 mg by mouth 2 (two) times daily as needed for nausea.      Marland Kitchen oxyCODONE (OXY IR/ROXICODONE) 5 MG immediate release tablet Take 1 tablet (5 mg total) by mouth every 6 (six) hours as needed.  30 tablet  0  . predniSONE (DELTASONE) 5 MG tablet TAKE ONE TABLET BY MOUTH ONCE DAILY.  TAKE FOR 4 DAYS AFTER DISCONTINUING DEXAMETHASONE.  30 tablet  0  . prochlorperazine (COMPAZINE) 10 MG tablet Take 1 tablet (10 mg total) by mouth every 6 (six) hours as needed.  30 tablet  3   No current facility-administered medications for this visit.   Facility-Administered Medications Ordered in Other Visits  Medication Dose Route Frequency Provider Last Rate Last Dose  . heparin lock flush 100 unit/mL  500 Units Intravenous Once Lowella Dell, MD      . sodium chloride 0.9 % injection 10 mL  10 mL Intravenous PRN Lowella Dell, MD         OBJECTIVE:  Middle-aged white woman in no acute distress  Filed Vitals:   04/19/13 1047  BP: 105/70  Pulse: 94  Temp: 98.1 F (36.7 C)  Resp: 18  Body mass index is 21.14 kg/(m^2). Filed Weights   04/19/13 1047  Weight: 123 lb 3.2 oz (55.883 kg)   ECOG FS: 1 - Symptomatic but completely ambulatory  Sclerae unicteric, pupils are equal round  and reactive, EOMs are intact Oropharynx shows no thrush or other lesions No cervical or supraclavicular adenopathy Lungs no rales or rhonchi, full excursion bilaterally Heart regular rate and rhythm Abd soft, nontender, positive bowel sounds MSK no focal spinal tenderness, no peripheral edema Neuro: Right foot drop, otherwise nonfocal, well oriented, appropriate affect Breasts: Deferred    LAB RESULTS:  Lab Results  Component Value Date   WBC 3.5* 04/19/2013   NEUTROABS 2.3 04/19/2013   HGB 9.4* 04/19/2013   HCT 27.9* 04/19/2013   MCV 92.8 04/19/2013   PLT 116* 04/19/2013      Chemistry      Component Value Date/Time   NA 145 03/29/2013 1400   NA 134* 03/18/2013 0940   NA 142 11/05/2010 1036   K 3.6 03/29/2013 1400   K 3.6 03/18/2013 0940   K 4.5 11/05/2010 1036   CL 100 03/18/2013 0940   CL 99 11/30/2012 0835   CL 99 11/05/2010 1036   CO2 27 03/29/2013 1400   CO2 24 03/18/2013 0940   CO2 26 11/05/2010 1036   BUN 11.8 03/29/2013 1400   BUN 25* 03/18/2013 0940   BUN 14 11/05/2010 1036   CREATININE 1.3* 03/29/2013 1400   CREATININE 1.87* 03/18/2013 0940   CREATININE 0.8 11/05/2010 1036      Component Value Date/Time   CALCIUM 8.6 03/29/2013 1400   CALCIUM 8.2* 03/18/2013 0940   CALCIUM 8.8 11/05/2010 1036   ALKPHOS 121 03/29/2013  1400   ALKPHOS 88 01/08/2012 0916   ALKPHOS 106* 11/05/2010 1036   AST 45* 03/29/2013 1400   AST 24 01/08/2012 0916   AST 27 11/05/2010 1036   ALT 60* 03/29/2013 1400   ALT 27 01/08/2012 0916   ALT 35 11/05/2010 1036   BILITOT 1.09 03/29/2013 1400   BILITOT 0.9 01/08/2012 0916   BILITOT 0.80 11/05/2010 1036        STUDIES: Mr Liver W Wo Contrast  2013/04/20   CLINICAL DATA:  History of breast cancer with widespread metastatic disease.  EXAM: MRI ABDOMEN WITHOUT AND WITH CONTRAST  TECHNIQUE: Multiplanar, multisequence MR imaging was performed both before and after administration of intravenous contrast.  CONTRAST:  6mL MULTIHANCE GADOBENATE DIMEGLUMINE  529 MG/ML IV SOLN  COMPARISON:  PET 02/03/2013. CT 02/11/2013. Most recent MRI of 07/22/2006  FINDINGS: Normal heart size without pericardial or pleural effusion. Retrocrural metastasis within node measuring 1.3 cm on image 15/ series 3. 1.5 cm on 02/11/2013.  Given cross modality comparison, slight progression of hepatic metastatic burden. For example, a lesion in the high left lobe of the liver measures 3.2 x 3.3 cm on image 16/ series 3 and compares with 2.6 x 2.3 cm on the prior exam.  The more cephalad right liver lobe 3.1 x 3.0 cm lesion on image 14/ series 3 compares with 2.5 x 2.3 cm on the prior.  A 2.4 x 1.8 cm inferior right hepatic lobe lesion on image 31/ series 3 was only subtly apparent on the prior CT.  Normal spleen, stomach, pancreas. Left adrenal metastasis measures 5.5 x 4.9 cm on image 49/ series 7 and is similar to 5.6 x 4.8 cm on the 02/11/2013 CT.  Retroperitoneal adenopathy. A preaortic index node measures 2.4 x 2.8 cm on image 35/series 3. Decreased from 2.7 x 3.7 cm on the prior exam.  A low retroperitoneal node adjacent the bifurcating aorta measures 3.8 cm on image 81/ series 1102. 4.0 cm on the prior.  The retroperitoneal adenopathy causes narrowing of the IVC, including on image 64/ series 13. No thrombus within. There is mild to moderate bilateral hydronephrosis with probable ureteric stents in place. No evidence of bowel obstruction or ascites.  IMPRESSION: 1. Slight progression of hepatic metastasis since 01/2013. Evaluation slightly degraded by across modality comparison. 2. Similar left adrenal metastasis. 3. Minimal improvement in retroperitoneal and retrocrural nodal metastasis. The retroperitoneal adenopathy causes marked narrowing of the IVC. 4. Similar bilateral hydronephrosis.   Electronically Signed   By: Jeronimo Greaves M.D.   On: 04-20-2013 13:57    ASSESSMENT: 62 y.o.  [place] with stage IV breast cancer:  1. Status post left modified radical  mastectomy, August 2007, for a 5-cm invasive ductal carcinoma, grade 3, involving 11/14 lymph nodes, triple positive.  2. Status post 4 cycles of adjuvant docetaxel, carboplatin and trastuzumab completed November 2007.  3. Postmastectomy radiation completed February 2008.  4. Anastrozole begun February 2008 (the trastuzumab also was continued to complete a year), continued until June 2012.  5. 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 with left retrocrural and retroperitoneal adenopathy.   6. On tamoxifen/lapatinib/trastuzumab between June 2012 and October 2012.   7. Ixempra/lapatinib/trastuzumab started October 2012, the Ixempra discontinued in February 2013 due to peripheral neuropathy.   8. Continuing on lapatinib/trastuzumab with the addition of fulvestrant, first injections given on  07/31/2011. Lapatinib given at a dose of 1 g (4 tablets) daily, and trastuzumab given at 8 mg/kg every 4 weeks to coordinate with injection appointments.  9. Discontinued lapatinib and fulvestrant in December 2013.  10. Started q. three-week docetaxel/ trastuzumab/ pertuzumab, first dose given 06/15/2012.  Docetaxel given at a 50% dose reduction beginning with cycle 2 due to poor tolerance and severe neutropenia. Completed 6 cycles as of 09/28/2012.   11. Trastuzumab/pertuzumab continued  to June of 2014, with evidence of disease progression. Letrozole added 10/19/2012.  12 Bilateral hydronephrosis noted March 2014, bilateral ureteral stenting performed 09/11/2012 under Dr Brunilda Payor, stents replaced as per routine, most recently 03/15/2013  13Herpes Zoster June 2014, resolved.  14 TDM-1 started 11/30/2012, with 1 dose given.  An echocardiogram on 12/07/2012 showed a reduced ejection fraction of 40%, likely secondary to previous treatment with trastuzumab. Subsequently,  TDM-1 was discontinued.  15 Started carboplatin/gemcitabine, both agents given on days one and 8 of each 21 day cycle, first dose on 12/21/2012.  Carboplatin given at an AUC of 2, the gemcitabine at 1000 mg per meter square.  Discontinued October 2014 witrh evidence of progression   16 Resumed lapatinib August of 2014; most recent echocardiogram 03/26/2013 shows a well preserved ejection fraction  17. Exemestane and everolimus started October 2014 (the exemestane 03/30/2013, the everolimus 04/07/2013)      PLAN:  I am not sure whether or not what Bonita Quin experienced in [place] was a seizure. We are going to obtain a brain MRI in any case. Her last 1 was 6 months ago. She will call for results or I will call her if there is an urgent finding.  Otherwise the plan is to continue the exemestane, everolimus, and lapatinib through the end of this year. She is going to have a repeat liver MRI did very last week in December, and she will see me the first week in January. We should be able to make a decision at that time whether or not to continue the current treatment. If not, we will likely refer her to [facility] for consideration of one of their studies, or we will consider some of the available drugs she has not yet had.  Lynda and Rebecca Pugh have a very good understanding of this plan. She is very much in agreement with that. She knows to call for any problems that may develop before the next visit here, which will be the first week in December.  Lowella Dell, MD  04/19/2013  10:54 AM

## 2013-04-22 ENCOUNTER — Other Ambulatory Visit: Payer: Self-pay

## 2013-04-27 ENCOUNTER — Other Ambulatory Visit: Payer: Self-pay | Admitting: *Deleted

## 2013-04-30 ENCOUNTER — Encounter: Payer: Self-pay | Admitting: Oncology

## 2013-04-30 ENCOUNTER — Ambulatory Visit (HOSPITAL_COMMUNITY)
Admission: RE | Admit: 2013-04-30 | Discharge: 2013-04-30 | Disposition: A | Discharge status: Home / Self Care | Payer: Medicare Other | Source: Ambulatory Visit | Attending: Oncology | Admitting: Oncology

## 2013-04-30 DIAGNOSIS — R569 Unspecified convulsions: Secondary | ICD-10-CM | POA: Insufficient documentation

## 2013-04-30 DIAGNOSIS — N1330 Unspecified hydronephrosis: Secondary | ICD-10-CM

## 2013-04-30 DIAGNOSIS — C50919 Malignant neoplasm of unspecified site of unspecified female breast: Secondary | ICD-10-CM

## 2013-04-30 MED ORDER — GADOBENATE DIMEGLUMINE 529 MG/ML IV SOLN
5.0000 mL | Freq: Once | INTRAVENOUS | Status: AC | PRN
  Administered 2013-04-30: 5 mL via INTRAVENOUS

## 2013-04-30 NOTE — Progress Notes (Signed)
Per Vena [place] patient was approved 15,000  04/19/13-04/18/14 for Tykerb and Afinitor. I will send to billing and medical records

## 2013-05-05 ENCOUNTER — Telehealth: Payer: Self-pay | Admitting: *Deleted

## 2013-05-05 NOTE — Telephone Encounter (Signed)
Pt. Notified of the results of MRI done on 04/30/2013. No evidence of metastatic disease. [name] will see patient in early Jan.2015 and patient will be notified if any changes in plan.  Pt verbalized understanding. No further concerns.

## 2013-05-07 ENCOUNTER — Telehealth: Payer: Self-pay | Admitting: *Deleted

## 2013-05-07 NOTE — Telephone Encounter (Signed)
This RN spoke with pt post message left by PT stating noted concerns this am for orthostatic vitals, with pt stating mild nause, loose stools and decreased appetite.  Per conversation- Rebecca Pugh states since this am and visit by PT- she is feeling much better. She has been able to drink 2 large glasses of water and eat some food. She states she has loose stool in am only, " so though is is loose it is only in the morning and not ongoing ".  This RN discussed concerns per PT relating to symptoms of dehydration- and inquired if pt would want IV fluids. Rebecca Pugh states she will monitor symptoms as well push fluids.  Per end of call, Rebecca Pugh verbalized understanding to call if symptoms worsen.

## 2013-05-14 ENCOUNTER — Encounter: Payer: Self-pay | Admitting: Oncology

## 2013-05-14 NOTE — Progress Notes (Signed)
I received letter from [employer] and they need her income verf. I called and she said her hubby sent direct to them on last week.

## 2013-05-17 ENCOUNTER — Other Ambulatory Visit: Payer: Self-pay | Admitting: Oncology

## 2013-05-17 ENCOUNTER — Other Ambulatory Visit (HOSPITAL_BASED_OUTPATIENT_CLINIC_OR_DEPARTMENT_OTHER): Payer: Medicare Other | Admitting: Lab

## 2013-05-17 ENCOUNTER — Telehealth: Payer: Self-pay | Admitting: *Deleted

## 2013-05-17 ENCOUNTER — Encounter: Payer: Self-pay | Admitting: Physician Assistant

## 2013-05-17 ENCOUNTER — Ambulatory Visit (HOSPITAL_BASED_OUTPATIENT_CLINIC_OR_DEPARTMENT_OTHER): Payer: Medicare Other | Admitting: Physician Assistant

## 2013-05-17 VITALS — BP 143/77 | HR 98 | Temp 98.1°F | Resp 18 | Ht 64.0 in | Wt 117.9 lb

## 2013-05-17 DIAGNOSIS — R0609 Other forms of dyspnea: Secondary | ICD-10-CM

## 2013-05-17 DIAGNOSIS — C787 Secondary malignant neoplasm of liver and intrahepatic bile duct: Secondary | ICD-10-CM

## 2013-05-17 DIAGNOSIS — C773 Secondary and unspecified malignant neoplasm of axilla and upper limb lymph nodes: Secondary | ICD-10-CM

## 2013-05-17 DIAGNOSIS — C7970 Secondary malignant neoplasm of unspecified adrenal gland: Secondary | ICD-10-CM

## 2013-05-17 DIAGNOSIS — N1330 Unspecified hydronephrosis: Secondary | ICD-10-CM

## 2013-05-17 DIAGNOSIS — D649 Anemia, unspecified: Secondary | ICD-10-CM

## 2013-05-17 DIAGNOSIS — C50912 Malignant neoplasm of unspecified site of left female breast: Secondary | ICD-10-CM

## 2013-05-17 DIAGNOSIS — M21371 Foot drop, right foot: Secondary | ICD-10-CM

## 2013-05-17 DIAGNOSIS — C50919 Malignant neoplasm of unspecified site of unspecified female breast: Secondary | ICD-10-CM

## 2013-05-17 DIAGNOSIS — C7889 Secondary malignant neoplasm of other digestive organs: Secondary | ICD-10-CM

## 2013-05-17 DIAGNOSIS — C50219 Malignant neoplasm of upper-inner quadrant of unspecified female breast: Secondary | ICD-10-CM

## 2013-05-17 DIAGNOSIS — R5381 Other malaise: Secondary | ICD-10-CM

## 2013-05-17 DIAGNOSIS — D630 Anemia in neoplastic disease: Secondary | ICD-10-CM

## 2013-05-17 LAB — COMPREHENSIVE METABOLIC PANEL (CC13)
AST: 66 U/L — ABNORMAL HIGH (ref 5–34)
Albumin: 2.9 g/dL — ABNORMAL LOW (ref 3.5–5.0)
Alkaline Phosphatase: 220 U/L — ABNORMAL HIGH (ref 40–150)
Anion Gap: 13 mEq/L — ABNORMAL HIGH (ref 3–11)
BUN: 20.1 mg/dL (ref 7.0–26.0)
CO2: 24 mEq/L (ref 22–29)
Calcium: 10.2 mg/dL (ref 8.4–10.4)
Chloride: 102 mEq/L (ref 98–109)
Glucose: 188 mg/dl — ABNORMAL HIGH (ref 70–140)
Potassium: 3.5 mEq/L (ref 3.5–5.1)

## 2013-05-17 LAB — CBC WITH DIFFERENTIAL/PLATELET
Basophils Absolute: 0 10*3/uL (ref 0.0–0.1)
EOS%: 0.6 % (ref 0.0–7.0)
Eosinophils Absolute: 0 10*3/uL (ref 0.0–0.5)
HGB: 9.5 g/dL — ABNORMAL LOW (ref 11.6–15.9)
LYMPH%: 12.3 % — ABNORMAL LOW (ref 14.0–49.7)
NEUT#: 3.1 10*3/uL (ref 1.5–6.5)
NEUT%: 80.7 % — ABNORMAL HIGH (ref 38.4–76.8)
Platelets: 123 10*3/uL — ABNORMAL LOW (ref 145–400)
RDW: 17 % — ABNORMAL HIGH (ref 11.2–14.5)
WBC: 3.8 10*3/uL — ABNORMAL LOW (ref 3.9–10.3)
lymph#: 0.5 10*3/uL — ABNORMAL LOW (ref 0.9–3.3)

## 2013-05-17 NOTE — Telephone Encounter (Signed)
Per PA request. Called patient to let her know to stop the Letrozole and continue the Exemestane, Afinitor, Tykerb. Patient verbalized understanding. No further concerns. Message to be forwarded to [employer].

## 2013-05-17 NOTE — Progress Notes (Signed)
[facility]  Telephone:([phone]) [phone] Fax:([phone]  OFFICE PROGRESS NOTE   ID: Rebecca Pugh   DOB: 02-Jun-1951  MR#: [id number]  JYN#:[id number]   PCP: [name], MD SU: [name], MD URO: Wendie Simmer. Nesi, MD  CHIEF COMPLAINT:  Metastatic Breast Cancer   HISTORY OF PRESENT ILLNESS: The patient and her husband, Rebecca Pugh, moved back to this area in 2007 from [facility] (Rebecca Pugh actually is a native of [place] and incidentally remains a Herbalist).  As part of moving boxes, Ms. Lapier felt something under her left arm.  She brought it to [name] attention and although the patient states that she has always had some cyclical swelling of her left axilla during menstruation, [name] set the patient up for evaluation at [facility].  On 12-12-2005 the patient had a mammogram and ultrasound there and these showed no mass, distortion or microcalcifications in either breast however, in the left axilla there was a large macrolobulated mass and just above that there was a slightly prominent lymph node which did demonstrate a fatty hilum.  Physically on exam the mass was mobile and nontender and measured approximately 6.5 cm.  The ultrasound showed two small hypoechoic nodules in the left breast 3 cm. from the nipple measuring 7 mm. each and felt to be most likely benign however, the axillary mass, of course, was quite suspicious and biopsy was performed the same day. This showed ([id number] and [id number]) invasive breast cancer which was strongly ER positive at 76%, moderately PR positive at 25% with a very high proliferation marker at 66%.  HER-2/neu was 1+ and FISH showed no amplification with a ratio of 1.1.    With this information, the patient was referred to Dr. [name] and on 12-20-05 the patient had bilateral breast MRI's.  In the left breast there was a 5.8 cm. axillary mass and several abnormally enlarged lymph nodes.  There was a 7 mm. left supraclavicular  lymph node and the two nodules in the breast that had been noted by ultrasound were also noted by MRI measuring 1.1 and 0.7 cm.  In the right breast there were no abnormalities noted.  MR guided biopsies of the left breast masses were obtained on 12-27-05 and both showed high grade ductal carcinoma.  There was evidence of lymphovascular invasion and high grade ductal carcinoma in situ as well ([id number]).    [name] discussed the situation with [name] and they felt it would be useful to have a PET scan prior to definitive surgery.  This was obtained on 12-30-2005 and showed the primary axillary mass to have an SUV of 8.5.  The lymph nodes in the left axilla had SUV's in the 2.2 range, small focus of increased activity in the left breast had an SUV of 2.3.  There was also skin thickening with mildly increased diffuse FDG uptake within the skin suggesting an inflammatory breast cancer.  The neck, abdomen and  pelvis were negative.  In the lung windows, there were several scattered small areas of focal nodular density in the upper lobes with compressive atelectasis and this was felt to be benign.  Chest x-ray on 01-09-2006 was negative.  With this information, [name] proceeded, after appropriate discussion, to left modified radical mastectomy on 01-15-2006 and the insertion of a BARD port at the same time.  The final pathology ([id number]) showed the maximum tumor size to be 5 cm.  Margins were ample.  There was evidence of lymphovascular  invasion.  This infiltrating ductal carcinoma was a grade 3 of 3 and 11 out of 14 lymph nodes were involved.  There was evidence of extracapsular extension.    Her subsequent history is as detailed below.   INTERVAL HISTORY: Rebecca Pugh returns today accompanied by her sister for followup of her metastatic breast cancer.  She is currently on exemestane/everolimus plus lapatinib. The good news is, not only if she tolerating this medications well, but she has also been  approved for patient assistance for both the everolimus and lapatinib, so she is receiving these medications for free!  (Of note, Rebecca Pugh also tells me she has been taking the letrozole along with the others, and she was instructed to discontinue the letrozole right away.)   Overall, Rebecca Pugh is feeling "okay".  She had a brain MRI in mid November which was unremarkable. She has done a good job of keeping herself well hydrated, and has had no additional episodes of dizziness, loss of consciousness, or questionable seizure activity. Her blood pressure has been more steady. Her energy level has improved slightly, although she continues to feel very fatigued.  She continues to have shortness of breath with exertion, but denies any problems of shortness of breath with rest, or when lying down. She's had no peripheral swelling.  Rebecca Pugh continues to have occasional loose stools, although she tells me they are "loose, not watery". These typically occur only in the morning. If she has more than 2 loose stools, she takes one tablet of Imodium. If she becomes constipated, she takes a stool softener, and thus far she has been able to manage her bowel movements quite effectively using this routine. She has noticed no blood or mucus in the stool, and denies any cramping. She does have some localized pain intermittently in the right upper quadrant.  Rebecca Pugh received physical therapy for her right foot drop and feels like this has improved significantly. She's had no falls, and is having less problems with walking.   REVIEW OF SYSTEMS: Rebecca Pugh has had no recent fevers or chills. She is having no problems with hot flashes. She's had no mouth ulcers, oral sensitivity, or problems swallowing. She denies any rashes or skin changes and has had no abnormal bruising or bleeding. She's had one isolated episode of nausea which she attributes to "overeating". Her appetite is still decreased. She's had no change in urinary habits, and denies  current dysuria or hematuria. She's had no increased cough, phlegm production, orthopnea, chest pain, or palpitations. She denies any abnormal headaches and has had no change in vision. Currently, in fact, she denies any pain "from head to toe", other than the occasional pain in the right upper quadrant of the abdomen as noted above.   A detailed review of systems is otherwise stable and noncontributory.    PAST MEDICAL HISTORY: Past Medical History  Diagnosis Date  . Hypertension   . Depression   . Hydronephrosis, bilateral   . Breast cancer metastasized to multiple sites POSITIVE METS IN 2012-----  ONCOLOGIST--  DR Darnelle Catalan--  CURRENTLY RECEIVING CHEMO  EVERY 3 WEEKS (STARTED 06-15-2012)    FIRST DX 01-15-2006, INVASIVE DUCTAL GRADE III/    S/P LEFT MASTECTOMY AND CHEMORADIATION (CHEMO COMPLETE NOV 2007 AND XRT COMPLETE FEB 2008)  . Acute renal failure   . Anemia   . Anxiety   . Cancer     PAST SURGICAL HISTORY: Past Surgical History  Procedure Laterality Date  . Orif ankle fracture  01/31/2012    Procedure:  OPEN REDUCTION INTERNAL FIXATION (ORIF) ANKLE FRACTURE;  Surgeon: Loanne Drilling, MD;  Location: [facility];  Service: Orthopedics;  Laterality: Right;  . Port-a-cath placement  12-05-2010  . Mastectomy modified radical Left 01-15-2006    W/ LYMPH NODE DISSECTIONS AND PORT-A-CATH PLACEMENT (REMOVAL 04-06-2007)  . Transthoracic echocardiogram  09-08-2012    MILD LVF/  NORMAL LVSF/ EF 60-65%  . Cholecystectomy  1977    AND APPENDECTOMY  . Tonsillectomy  AS CHILD  . Cystoscopy w/ ureteral stent placement Bilateral 09/11/2012    Procedure: CYSTOSCOPY WITH RETROGRADE PYELOGRAM/URETERAL STENT PLACEMENT;  Surgeon: Lindaann Slough, MD;  Location: [facility];  Service: Urology;  Laterality: Bilateral;  1 HR   . Transthoracic echocardiogram  09-08-2012    mild lvf/ lvsf normal/ ef 60-65%  . Cystoscopy with stent placement N/A 10/20/2012    Procedure: CYSTOSCOPY, bilateral  retrogrades WITH bilateral  STENT RE PLACEMENTs;  Surgeon: Lindaann Slough, MD;  Location: [facility] [place];  Service: Urology;  Laterality: N/A;  BILATERAL JJ STENT PLACEMENT   . Cystoscopy w/ ureteral stent placement Bilateral 01/15/2013    Procedure: CYSTOSCOPY WITH STENT REPLACEMENT;  Surgeon: Lindaann Slough, MD;  Location: [facility];  Service: Urology;  Laterality: Bilateral;  . Stent removal Bilateral 03/12/2013    Ureteral stent  . Appendectomy    . Skin biopsy      Simple keratoses  . Cystoscopy w/ ureteral stent placement Bilateral 03/16/2013    Procedure: CYSTOSCOPY WITH RETROGRADE PYELOGRAM/URETERAL STENT PLACEMENT;  Surgeon: Lindaann Slough, MD;  Location: [facility];  Service: Urology;  Laterality: Bilateral;     FAMILY HISTORY:  Family History  Problem Relation Age of Onset  . COPD Mother   . Cancer Mother     Bladder cancer  . Healthy Sister   The patient has no information regarding her father. She was brought up by her stepfather. The patient's mother died at the age of 47 with bladder cancer. The patient has one sister who is in good health. There is no history of breast or ovarian cancer in the family to the patient's knowledge.    GYNECOLOGIC HISTORY: She is G2, P2, age of menses 41, age parity 64, age of menopause 59.   SOCIAL HISTORY: She and her husband, Rebecca Pugh, have been married 10 years. 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 She has two children, a daughter, [name], who lives in [place] and is a Investment banker, corporate, and a son, [name], also in [place], who works for [employer]. She has two grandchildren and three step-grandchildren from her own two children, [name] and [name], who are from her first marriage. Rebecca Pugh has one daughter from his first marriage and he has grandchildren through her. The patient was brought up as a Catholic but currently is not  practicing. She and Rebecca Pugh are attending a [employer].     ADVANCED DIRECTIVES: Not on file   HEALTH MAINTENANCE:  (updated Rebecca Pugh 2014)  History  Substance Use Topics  . Smoking status: Former Smoker -- 20 years    Quit date: 01/31/1995  . Smokeless tobacco: Never Used  . Alcohol Use: Yes     Comment: Occasional     Colonoscopy:  Not on file  PAP: Not on file  Bone density: August 2012, "Normal"  Lipid panel: Not on file  No Known Allergies  Current Outpatient Prescriptions  Medication Sig Dispense Refill  .  calcium citrate (CALCITRATE - DOSED IN MG ELEMENTAL CALCIUM) 950 MG tablet Take 1 tablet by mouth daily.       Marland Kitchen everolimus (AFINITOR) 10 MG tablet Take 1 tablet (10 mg total) by mouth daily.  30 tablet  4  . exemestane (AROMASIN) 25 MG tablet Take 1 tablet (25 mg total) by mouth daily after breakfast.  90 tablet  4  . FLUoxetine (PROZAC) 40 MG capsule TAKE ONE CAPSULE BY MOUTH ONCE DAILY  30 capsule  6  . lapatinib (TYKERB) 250 MG tablet Take 4 tablets (1,000 mg total) by mouth daily.  120 tablet  3  . letrozole (FEMARA) 2.5 MG tablet       . lidocaine-prilocaine (EMLA) cream Apply topically as needed.  30 g  3  . loperamide (IMODIUM) 2 MG capsule Take 2 mg by mouth 4 (four) times daily as needed. diarrhea      . LORazepam (ATIVAN) 0.5 MG tablet Take 1 tablet (0.5 mg total) by mouth 2 (two) times daily as needed.  60 tablet  0  . metoCLOPramide (REGLAN) 10 MG tablet Take 0.5 tablets (5 mg total) by mouth 4 (four) times daily -  before meals and at bedtime.  120 tablet  12  . mirabegron ER (MYRBETRIQ) 25 MG TB24 tablet Take 1 tablet (25 mg total) by mouth daily.  30 tablet  0  . ondansetron (ZOFRAN) 8 MG tablet Take 8 mg by mouth 2 (two) times daily as needed for nausea.      Marland Kitchen oxyCODONE (OXY IR/ROXICODONE) 5 MG immediate release tablet Take 1 tablet (5 mg total) by mouth every 6 (six) hours as needed.  30 tablet  0  . predniSONE (DELTASONE) 5 MG tablet TAKE ONE  TABLET BY MOUTH ONCE DAILY.  TAKE FOR 4 DAYS AFTER DISCONTINUING DEXAMETHASONE.  30 tablet  0  . prochlorperazine (COMPAZINE) 10 MG tablet Take 1 tablet (10 mg total) by mouth every 6 (six) hours as needed.  30 tablet  3  . cholecalciferol (VITAMIN D) 1000 UNITS tablet Take 3,000 Units by mouth daily.       Marland Kitchen gabapentin (NEURONTIN) 300 MG capsule Take 1 capsule (300 mg total) by mouth at bedtime.  30 capsule  5  . omeprazole (PRILOSEC) 40 MG capsule Take 1 capsule (40 mg total) by mouth at bedtime.  30 capsule  12   No current facility-administered medications for this visit.   Facility-Administered Medications Ordered in Other Visits  Medication Dose Route Frequency Provider Last Rate Last Dose  . heparin lock flush 100 unit/mL  500 Units Intravenous Once Lowella Dell, MD      . sodium chloride 0.9 % injection 10 mL  10 mL Intravenous PRN Lowella Dell, MD         OBJECTIVE:  Middle-aged white woman in no acute distress  Filed Vitals:   05/17/13 1404  BP: 143/77  Pulse: 98  Temp: 98.1 F (36.7 C)  Resp: 18  Body mass index is 20.23 kg/(m^2).  ECOG: 1 Filed Weights   05/17/13 1404  Weight: 117 lb 14.4 oz (53.479 kg)   Physical Exam: HEENT:  Sclerae anicteric.  Oropharynx clear. No ulcerations. No signs of candidiasis. Buccal mucosa is pink and moist. NODES:  There are some small palpable lymph nodes in the left supraclavicular region, but no additional cervical or supraclavicular lymphadenopathy palpated. BREAST EXAM:  Patient status post left mastectomy. No specialist skin changes, nodularity, or evidence of local recurrence. Axillae are benign  bilaterally, with no palpable lymphadenopathy. LUNGS:  Clear to auscultation bilaterally.  No wheezes or rhonchi.  Good excursion bilaterally. HEART:  Regular rate and rhythm.  ABDOMEN:  Soft, mild tenderness to palpation in the right upper quadrant, but no palpable hepatomegaly or masses.  Positive bowel sounds.  MSK:  No focal  spinal tenderness to palpation. Full range of motion in the upper extremities. No joint swelling. EXTREMITIES:  No peripheral edema.   SKIN:  Benign. With no obvious rashes or skin changes. NEURO:  Nonfocal. Well oriented. Right foot drop. Positive affect.       LAB RESULTS:  Lab Results  Component Value Date   WBC 3.8* 05/17/2013   NEUTROABS 3.1 05/17/2013   HGB 9.5* 05/17/2013   HCT 28.5* 05/17/2013   MCV 89.8 05/17/2013   PLT 123* 05/17/2013      Chemistry      Component Value Date/Time   NA 139 05/17/2013 1353   NA 134* 03/18/2013 0940   NA 142 11/05/2010 1036   K 3.5 05/17/2013 1353   K 3.6 03/18/2013 0940   K 4.5 11/05/2010 1036   CL 100 03/18/2013 0940   CL 99 11/30/2012 0835   CL 99 11/05/2010 1036   CO2 24 05/17/2013 1353   CO2 24 03/18/2013 0940   CO2 26 11/05/2010 1036   BUN 20.1 05/17/2013 1353   BUN 25* 03/18/2013 0940   BUN 14 11/05/2010 1036   CREATININE 1.7* 05/17/2013 1353   CREATININE 1.87* 03/18/2013 0940   CREATININE 0.8 11/05/2010 1036      Component Value Date/Time   CALCIUM 10.2 05/17/2013 1353   CALCIUM 8.2* 03/18/2013 0940   CALCIUM 8.8 11/05/2010 1036   ALKPHOS 220* 05/17/2013 1353   ALKPHOS 88 01/08/2012 0916   ALKPHOS 106* 11/05/2010 1036   AST 66* 05/17/2013 1353   AST 24 01/08/2012 0916   AST 27 11/05/2010 1036   ALT 35 05/17/2013 1353   ALT 27 01/08/2012 0916   ALT 35 11/05/2010 1036   BILITOT 0.89 05/17/2013 1353   BILITOT 0.9 01/08/2012 0916   BILITOT 0.80 11/05/2010 1036        STUDIES: Mr Laqueta Jean Wo Contrast  05/30/13   CLINICAL DATA:  Metastatic breast cancer.  Seizure.  EXAM: MRI HEAD WITHOUT AND WITH CONTRAST  TECHNIQUE: Multiplanar, multiecho pulse sequences of the brain and surrounding structures were obtained without and with intravenous contrast.  CONTRAST:  5mL MULTIHANCE GADOBENATE DIMEGLUMINE 529 MG/ML IV SOLN  COMPARISON:  CT 02/11/2013.  MRI 12/04/2012.  MRI 12/04/2010.  FINDINGS: Diffusion imaging does not show any acute or subacute  infarction. There are mild chronic small-vessel changes of the pons. No cerebellar abnormality. The cerebral hemispheres do not show any old small or large vessel infarction. No hemorrhage, hydrocephalus or extra-axial collection.  After contrast administration, no abnormal enhancement occurs. No evidence of primary or metastatic mass lesion.  No skull or skullbase lesion is seen. The pituitary gland is unremarkable. No inflammatory sinus disease.  IMPRESSION: No change. No evidence of metastatic disease. Mild chronic small-vessel change within the pons. No cause of seizure identified.   Electronically Signed   By: Paulina Fusi M.D.   On: 05/30/2013 19:56    ASSESSMENT: 62 y.o.  Rebecca Pugh, Rebecca Pugh Rebecca Pugh woman with stage IV breast cancer:  1. Status post left modified radical mastectomy, August 2007, for a 5-cm invasive ductal carcinoma, grade 3, involving 05/31/2023 lymph nodes, triple positive.  2. Status post 4 cycles of adjuvant docetaxel,  carboplatin and trastuzumab completed November 2007.  3. Postmastectomy radiation completed February 2008.  4. Anastrozole begun February 2008 (the trastuzumab also was continued to complete a year), continued until June 2012.  5. 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 with left retrocrural and retroperitoneal adenopathy.   6. On tamoxifen/lapatinib/trastuzumab between June 2012 and October 2012.   7. Ixempra/lapatinib/trastuzumab started October 2012, the Ixempra discontinued in February 2013 due to peripheral neuropathy.   8. Continuing on lapatinib/trastuzumab with the addition of fulvestrant, first injections given on 07/31/2011. Lapatinib given at a dose of 1 g (4 tablets) daily, and trastuzumab given at 8 mg/kg every 4 weeks to coordinate with injection appointments.  9. Discontinued lapatinib and fulvestrant in  Rebecca Pugh 2013.  10. Started q. three-week docetaxel/ trastuzumab/ pertuzumab, first dose given 06/15/2012.  Docetaxel given at a 50% dose reduction beginning with cycle 2 due to poor tolerance and severe neutropenia. Completed 6 cycles as of 09/28/2012.   11. Trastuzumab/pertuzumab continued  to June of 2014, with evidence of disease progression. Letrozole added 10/19/2012, discontinued with resumption of IV chemotherapy in July.  12 Bilateral hydronephrosis noted March 2014, bilateral ureteral stenting performed 09/11/2012 under Dr Brunilda Payor, stents replaced as per routine, most recently 03/15/2013  13Herpes Zoster June 2014, resolved.  14 TDM-1 started 11/30/2012, with 1 dose given.  An echocardiogram on 12/07/2012 showed a reduced ejection fraction of 40%, likely secondary to previous treatment with trastuzumab. Subsequently, TDM-1 was discontinued.  15 Started carboplatin/gemcitabine, both agents given on days one and 8 of each 21 day cycle, first dose on 12/21/2012.  Carboplatin given at an AUC of 2, the gemcitabine at 1000 mg per meter square.  Discontinued October 2014 witrh evidence of progression   16 Resumed lapatinib August of 2014; most recent echocardiogram 03/26/2013 shows a well preserved ejection fraction.  This is due to be repeated in January 2015.   17. Exemestane and everolimus started October 2014 (the exemestane 03/30/2013, the everolimus 04/07/2013)      PLAN:  This case was reviewed with [name]. Samyia seems to be tolerating this regimen well, and will continue on exemestane/everol and lapatinib as before. As noted above, she is no longer taking letrozole.  We're currently making no changes in Meggan's regimen. The plan is to repeat an abdominal MRI to evaluate the liver on Rebecca Pugh 31, and she will see [name] the following week to review those results. At that time, they will make a decision whether or not to continue the current regimen. If not, we will likely  refer her to [facility] for consideration of one of their studies, or we will consider some of the available drugs she has not yet had.  Olanda has a good understanding of this plan and voices her agreement with the above. Will see her again in approximately one month, but she'll call prior that time with any changes or problems.   Zollie Scale, PA-C  05/17/2013  5:33 PM

## 2013-05-19 ENCOUNTER — Telehealth: Payer: Self-pay | Admitting: Dietician

## 2013-05-19 NOTE — Telephone Encounter (Signed)
Brief Outpatient Oncology Nutrition Note  Patient has been identified to be at risk on malnutrition screen.  Wt Readings from Last 10 Encounters:  05/17/13 117 lb 14.4 oz (53.479 kg)  04/19/13 123 lb 3.2 oz (55.883 kg)  03/29/13 125 lb 14.4 oz (57.108 kg)  03/22/13 124 lb 8 oz (56.473 kg)  03/18/13 129 lb 10.1 oz (58.8 kg)  03/18/13 129 lb 10.1 oz (58.8 kg)  03/15/13 128 lb 3.2 oz (58.151 kg)  03/01/13 119 lb 11.2 oz (54.296 kg)  02/22/13 125 lb 9.6 oz (56.972 kg)  02/16/13 126 lb (57.153 kg)   Patient with Breast cancer metastasized to multiple sites.    Called patient due to continued weight loss.  Patient was last seen by Inpatient RD during admit on 03/16/13 and was diagnosed with severe malnutrition related to chronic illness AEB intake <75% for > one month and an 18% weight loss over the past 6 months.    Patient reports that she is doing much better and intake has improved.  Patient with a 4 lb weight loss last month.  Encouraged continued good intake and addition of nutritional supplements as needed.    Patient to call for any questions.  Oran Rein, RD, LDN

## 2013-06-02 ENCOUNTER — Other Ambulatory Visit: Payer: Self-pay | Admitting: Physician Assistant

## 2013-06-02 NOTE — Telephone Encounter (Signed)
Per Zollie Scale, PA-C. Returned Patient's call about refill on Prednisone 5 mg tablet. Patient states that she is still taking Prednisone 5 mg, one tablet daily. Zollie Scale, PA-C made aware. Rx Prednisone 5 mg tablet, #30, Refill x 1. Patient made aware.

## 2013-06-03 ENCOUNTER — Other Ambulatory Visit: Payer: Self-pay | Admitting: Physician Assistant

## 2013-06-03 DIAGNOSIS — C50912 Malignant neoplasm of unspecified site of left female breast: Secondary | ICD-10-CM

## 2013-06-03 DIAGNOSIS — M25512 Pain in left shoulder: Secondary | ICD-10-CM

## 2013-06-03 MED ORDER — OXYCODONE HCL 5 MG PO TABS: 5.0000 mg | ORAL_TABLET | Freq: Four times a day (QID) | ORAL | Status: DC | PRN

## 2013-06-08 ENCOUNTER — Other Ambulatory Visit: Payer: Self-pay | Admitting: Urology

## 2013-06-14 ENCOUNTER — Ambulatory Visit (HOSPITAL_COMMUNITY)
Admission: RE | Admit: 2013-06-14 | Discharge: 2013-06-14 | Disposition: A | Discharge status: Home / Self Care | Payer: Medicare Other | Source: Ambulatory Visit | Attending: Oncology | Admitting: Oncology

## 2013-06-14 ENCOUNTER — Other Ambulatory Visit: Payer: Medicare Other

## 2013-06-14 ENCOUNTER — Other Ambulatory Visit: Payer: Self-pay | Admitting: Oncology

## 2013-06-14 DIAGNOSIS — N1330 Unspecified hydronephrosis: Secondary | ICD-10-CM | POA: Insufficient documentation

## 2013-06-14 DIAGNOSIS — C772 Secondary and unspecified malignant neoplasm of intra-abdominal lymph nodes: Secondary | ICD-10-CM | POA: Insufficient documentation

## 2013-06-14 DIAGNOSIS — C787 Secondary malignant neoplasm of liver and intrahepatic bile duct: Secondary | ICD-10-CM | POA: Insufficient documentation

## 2013-06-14 DIAGNOSIS — C50919 Malignant neoplasm of unspecified site of unspecified female breast: Secondary | ICD-10-CM

## 2013-06-14 DIAGNOSIS — C7970 Secondary malignant neoplasm of unspecified adrenal gland: Secondary | ICD-10-CM | POA: Insufficient documentation

## 2013-06-15 ENCOUNTER — Ambulatory Visit (HOSPITAL_BASED_OUTPATIENT_CLINIC_OR_DEPARTMENT_OTHER): Payer: Medicare Other

## 2013-06-15 ENCOUNTER — Telehealth: Payer: Self-pay | Admitting: *Deleted

## 2013-06-15 ENCOUNTER — Other Ambulatory Visit: Payer: Self-pay | Admitting: Oncology

## 2013-06-15 ENCOUNTER — Ambulatory Visit (HOSPITAL_BASED_OUTPATIENT_CLINIC_OR_DEPARTMENT_OTHER): Payer: Medicare Other | Admitting: Oncology

## 2013-06-15 VITALS — BP 95/65 | HR 92 | Temp 97.3°F | Resp 18 | Ht 64.0 in | Wt 111.6 lb

## 2013-06-15 DIAGNOSIS — C7970 Secondary malignant neoplasm of unspecified adrenal gland: Secondary | ICD-10-CM

## 2013-06-15 DIAGNOSIS — D630 Anemia in neoplastic disease: Secondary | ICD-10-CM

## 2013-06-15 DIAGNOSIS — C50919 Malignant neoplasm of unspecified site of unspecified female breast: Secondary | ICD-10-CM

## 2013-06-15 DIAGNOSIS — N1330 Unspecified hydronephrosis: Secondary | ICD-10-CM

## 2013-06-15 DIAGNOSIS — M25512 Pain in left shoulder: Secondary | ICD-10-CM

## 2013-06-15 DIAGNOSIS — E860 Dehydration: Secondary | ICD-10-CM

## 2013-06-15 DIAGNOSIS — C50912 Malignant neoplasm of unspecified site of left female breast: Secondary | ICD-10-CM

## 2013-06-15 LAB — COMPREHENSIVE METABOLIC PANEL (CC13)
AST: 464 U/L — ABNORMAL HIGH (ref 5–34)
Anion Gap: 15 mEq/L — ABNORMAL HIGH (ref 3–11)
BUN: 26.1 mg/dL — ABNORMAL HIGH (ref 7.0–26.0)
CO2: 20 mEq/L — ABNORMAL LOW (ref 22–29)
Calcium: 9.9 mg/dL (ref 8.4–10.4)
Chloride: 100 mEq/L (ref 98–109)
Creatinine: 2.9 mg/dL — ABNORMAL HIGH (ref 0.6–1.1)
Glucose: 142 mg/dl — ABNORMAL HIGH (ref 70–140)
Total Protein: 5.8 g/dL — ABNORMAL LOW (ref 6.4–8.3)

## 2013-06-15 LAB — CBC WITH DIFFERENTIAL/PLATELET
Basophils Absolute: 0.1 10*3/uL (ref 0.0–0.1)
HCT: 28.3 % — ABNORMAL LOW (ref 34.8–46.6)
HGB: 9.4 g/dL — ABNORMAL LOW (ref 11.6–15.9)
MCHC: 33.3 g/dL (ref 31.5–36.0)
MCV: 83.2 fL (ref 79.5–101.0)
MONO#: 0.6 10*3/uL (ref 0.1–0.9)
MONO%: 11.6 % (ref 0.0–14.0)
NEUT%: 71.6 % (ref 38.4–76.8)
Platelets: 117 10*3/uL — ABNORMAL LOW (ref 145–400)
WBC: 5.2 10*3/uL (ref 3.9–10.3)
lymph#: 0.8 10*3/uL — ABNORMAL LOW (ref 0.9–3.3)

## 2013-06-15 MED ORDER — OXYCODONE HCL 5 MG PO TABS: 5.0000 mg | ORAL_TABLET | ORAL | Status: AC | PRN

## 2013-06-15 MED ORDER — OXYCODONE HCL ER 15 MG PO T12A: 15.0000 mg | EXTENDED_RELEASE_TABLET | Freq: Two times a day (BID) | ORAL | Status: AC

## 2013-06-15 MED ORDER — DEXAMETHASONE 4 MG PO TABS: 4.0000 mg | ORAL_TABLET | Freq: Three times a day (TID) | ORAL | Status: AC

## 2013-06-15 MED ORDER — POLYETHYLENE GLYCOL 3350 17 GM/SCOOP PO POWD: 1.0000 | Freq: Once | ORAL | Status: AC

## 2013-06-15 MED ORDER — DOCUSATE SODIUM 250 MG PO CAPS: 250.0000 mg | ORAL_CAPSULE | Freq: Two times a day (BID) | ORAL | Status: AC

## 2013-06-15 NOTE — Telephone Encounter (Signed)
Per MD this RN spoke with Gene informing him- MD would like to see pt today for IVF and to discuss results of scan.  Gene verbalized understanding - appointment made by this RN.

## 2013-06-15 NOTE — Progress Notes (Signed)
[facility]  [phone] [phone]  OFFICE PROGRESS NOTE   ID: Rebecca Pugh   DOB: 1950/06/25  MR#: [id number]  VHQ#:[id number]   PCP: [name], MD SU: [name], MD URO: Wendie Simmer. Nesi, MD  CHIEF COMPLAINT:  Metastatic Breast Cancer   HISTORY OF PRESENT ILLNESS: The patient and her husband, Rebecca Pugh, moved back to this area in 2007 from [facility] (Rebecca Pugh actually is a native of [place] and incidentally remains a Herbalist).  As part of moving boxes, [name] felt something under her left arm.  She brought it to [name] attention and although the patient states that she has always had some cyclical swelling of her left axilla during menstruation, [name] set the patient up for evaluation at [facility].  On 12-12-2005 the patient had a mammogram and ultrasound there and these showed no mass, distortion or microcalcifications in either breast however, in the left axilla there was a large macrolobulated mass and just above that there was a slightly prominent lymph node which did demonstrate a fatty hilum.  Physically on exam the mass was mobile and nontender and measured approximately 6.5 cm.  The ultrasound showed two small hypoechoic nodules in the left breast 3 cm. from the nipple measuring 7 mm. each and felt to be most likely benign however, the axillary mass, of course, was quite suspicious and biopsy was performed the same day. This showed ([id number] and [id number]) invasive breast cancer which was strongly ER positive at 76%, moderately PR positive at 25% with a very high proliferation marker at 66%.  HER-2/neu was 1+ and FISH showed no amplification with a ratio of 1.1.    With this information, the patient was referred to Dr. [name] and on 12-20-05 the patient had bilateral breast MRI's.  In the left breast there was a 5.8 cm. axillary mass and several abnormally enlarged lymph nodes.  There was a 7 mm. left supraclavicular  lymph node and the two nodules in the breast that had been noted by ultrasound were also noted by MRI measuring 1.1 and 0.7 cm.  In the right breast there were no abnormalities noted.  MR guided biopsies of the left breast masses were obtained on 12-27-05 and both showed high grade ductal carcinoma.  There was evidence of lymphovascular invasion and high grade ductal carcinoma in situ as well ([id number]).    [name] discussed the situation with [name] and they felt it would be useful to have a PET scan prior to definitive surgery.  This was obtained on 12-30-2005 and showed the primary axillary mass to have an SUV of 8.5.  The lymph nodes in the left axilla had SUV's in the 2.2 range, small focus of increased activity in the left breast had an SUV of 2.3.  There was also skin thickening with mildly increased diffuse FDG uptake within the skin suggesting an inflammatory breast cancer.  The neck, abdomen and  pelvis were negative.  In the lung windows, there were several scattered small areas of focal nodular density in the upper lobes with compressive atelectasis and this was felt to be benign.  Chest x-ray on 01-09-2006 was negative.  With this information, [name] proceeded, after appropriate discussion, to left modified radical mastectomy on 01-15-2006 and the insertion of a BARD port at the same time.  The final pathology ([id number]) showed the maximum tumor size to be 5 cm.  Margins were ample.  There was evidence of  lymphovascular invasion.  This infiltrating ductal carcinoma was a grade 3 of 3 and 11 out of 14 lymph nodes were involved.  There was evidence of extracapsular extension.    Her subsequent history is as detailed below.   INTERVAL HISTORY: Rebecca Pugh returns today for an unscheduled visit accompanied by her husband Rebecca Pugh. He called today to report that she was doing much worse, sleeping all the time, not eating or drinking much at all. She had her restaging MRI of the liver yesterday  and unfortunately it shows significant worsening of the cancer in her liver.  REVIEW OF SYSTEMS: Rebecca Pugh did well over the Christmas holidays, which included visits from both her children. She of course did not do any other cooking or cleaning up. As soon as they left home, she took to her bed and has been pretty much bed bound since. Her bedroom is in the upstairs portion of her to a level house. The bathroom is very close by. There have been no falls, but she does feel very weak. Food tastes terrible. If she forces herself to eat it just comes right back up. She has tried to take Reglan and occasionally Compazine for this. She just has no appetite. There is less trouble with fluids. She has pain in the right upper quadrant. It is significant although it was hard for her to give me a number. She is also moderately constipated. She has a bowel movement about every 3 days. They are not hard. There have been no unusual headaches, visual changes, jaundice, mouth sores or thrush him a cough, phlegm production, or pleurisy. Urine has been clear. A detailed review of systems today was otherwise stable    PAST MEDICAL HISTORY: Past Medical History  Diagnosis Date  . Hypertension   . Depression   . Hydronephrosis, bilateral   . Breast cancer metastasized to multiple sites POSITIVE METS IN 2012-----  ONCOLOGIST--  DR Darnelle Catalan--  CURRENTLY RECEIVING CHEMO  EVERY 3 WEEKS (STARTED 06-15-2012)    FIRST DX 01-15-2006, INVASIVE DUCTAL GRADE III/    S/P LEFT MASTECTOMY AND CHEMORADIATION (CHEMO COMPLETE NOV 2007 AND XRT COMPLETE FEB 2008)  . Acute renal failure   . Anemia   . Anxiety   . Cancer     PAST SURGICAL HISTORY: Past Surgical History  Procedure Laterality Date  . Orif ankle fracture  01/31/2012    Procedure: OPEN REDUCTION INTERNAL FIXATION (ORIF) ANKLE FRACTURE;  Surgeon: Loanne Drilling, MD;  Location: [facility];  Service: Orthopedics;  Laterality: Right;  . Port-a-cath placement  12-05-2010  .  Mastectomy modified radical Left 01-15-2006    W/ LYMPH NODE DISSECTIONS AND PORT-A-CATH PLACEMENT (REMOVAL 04-06-2007)  . Transthoracic echocardiogram  09-08-2012    MILD LVF/  NORMAL LVSF/ EF 60-65%  . Cholecystectomy  1977    AND APPENDECTOMY  . Tonsillectomy  AS CHILD  . Cystoscopy w/ ureteral stent placement Bilateral 09/11/2012    Procedure: CYSTOSCOPY WITH RETROGRADE PYELOGRAM/URETERAL STENT PLACEMENT;  Surgeon: Lindaann Slough, MD;  Location: [facility];  Service: Urology;  Laterality: Bilateral;  1 HR   . Transthoracic echocardiogram  09-08-2012    mild lvf/ lvsf normal/ ef 60-65%  . Cystoscopy with stent placement N/A 10/20/2012    Procedure: CYSTOSCOPY, bilateral retrogrades WITH bilateral  STENT RE PLACEMENTs;  Surgeon: Lindaann Slough, MD;  Location: [facility];  Service: Urology;  Laterality: N/A;  BILATERAL JJ STENT PLACEMENT   . Cystoscopy w/ ureteral stent placement Bilateral 01/15/2013  Procedure: CYSTOSCOPY WITH STENT REPLACEMENT;  Surgeon: Lindaann Slough, MD;  Location: [facility];  Service: Urology;  Laterality: Bilateral;  . Stent removal Bilateral 03/12/2013    Ureteral stent  . Appendectomy    . Skin biopsy      Simple keratoses  . Cystoscopy w/ ureteral stent placement Bilateral 03/16/2013    Procedure: CYSTOSCOPY WITH RETROGRADE PYELOGRAM/URETERAL STENT PLACEMENT;  Surgeon: Lindaann Slough, MD;  Location: [facility];  Service: Urology;  Laterality: Bilateral;     FAMILY HISTORY:  Family History  Problem Relation Age of Onset  . COPD Mother   . Cancer Mother     Bladder cancer  . Healthy Sister   The patient has no information regarding her father. She was brought up by her stepfather. The patient's mother died at the age of 36 with bladder cancer. The patient has one sister who is in good health. There is no history of breast or ovarian cancer in the family to the patient's knowledge.    GYNECOLOGIC HISTORY: She is  G2, P2, age of menses 31, age parity 27, age of menopause 54.   SOCIAL HISTORY: She and her husband, Rebecca Pugh, have been married 10 years. 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 She has two children, a daughter, [name], who lives in [place] and is a Investment banker, corporate, and a son, [name], also in [place], who works for [employer]. She has two grandchildren and three step-grandchildren from her own two children, [name] and [name], who are from her first marriage. Rebecca Pugh has one daughter from his first marriage and he has grandchildren through her. The patient was brought up as a Catholic but currently is not practicing. She and Rebecca Pugh are attending a [employer].     ADVANCED DIRECTIVES: The patient has a living will in place and should not be resuscitated in case of a terminal event   HEALTH MAINTENANCE:  (updated December 2014)  History  Substance Use Topics  . Smoking status: Former Smoker -- 20 years    Quit date: 01/31/1995  . Smokeless tobacco: Never Used  . Alcohol Use: Yes     Comment: Occasional     Colonoscopy:  Not on file  PAP: Not on file  Bone density: August 2012, "Normal"  Lipid panel: Not on file  No Known Allergies  Current Outpatient Prescriptions  Medication Sig Dispense Refill  . calcium citrate (CALCITRATE - DOSED IN MG ELEMENTAL CALCIUM) 950 MG tablet Take 1 tablet by mouth daily.       . cholecalciferol (VITAMIN D) 1000 UNITS tablet Take 3,000 Units by mouth daily.       Marland Kitchen everolimus (AFINITOR) 10 MG tablet Take 1 tablet (10 mg total) by mouth daily.  30 tablet  4  . exemestane (AROMASIN) 25 MG tablet Take 1 tablet (25 mg total) by mouth daily after breakfast.  90 tablet  4  . FLUoxetine (PROZAC) 40 MG capsule TAKE ONE CAPSULE BY MOUTH ONCE DAILY  30 capsule  6  . gabapentin (NEURONTIN) 300 MG capsule Take 1 capsule (300 mg total) by mouth at bedtime.  30 capsule  5  .  lapatinib (TYKERB) 250 MG tablet Take 4 tablets (1,000 mg total) by mouth daily.  120 tablet  3  . letrozole (FEMARA) 2.5 MG tablet       . lidocaine-prilocaine (EMLA) cream Apply topically as needed.  30 g  3  . loperamide (IMODIUM) 2 MG capsule Take 2 mg by mouth 4 (four) times daily as needed. diarrhea      . LORazepam (ATIVAN) 0.5 MG tablet Take 1 tablet (0.5 mg total) by mouth 2 (two) times daily as needed.  60 tablet  0  . metoCLOPramide (REGLAN) 10 MG tablet Take 0.5 tablets (5 mg total) by mouth 4 (four) times daily -  before meals and at bedtime.  120 tablet  12  . mirabegron ER (MYRBETRIQ) 25 MG TB24 tablet Take 1 tablet (25 mg total) by mouth daily.  30 tablet  0  . omeprazole (PRILOSEC) 40 MG capsule Take 1 capsule (40 mg total) by mouth at bedtime.  30 capsule  12  . ondansetron (ZOFRAN) 8 MG tablet Take 8 mg by mouth 2 (two) times daily as needed for nausea.      Marland Kitchen oxyCODONE (OXY IR/ROXICODONE) 5 MG immediate release tablet Take 1 tablet (5 mg total) by mouth every 6 (six) hours as needed for moderate pain or severe pain.  30 tablet  0  . predniSONE (DELTASONE) 5 MG tablet TAKE ONE TABLET BY MOUTH ONCE DAILY.  TAKE FOR 4 DAYS AFTER DISCONTINUING DEXAMETHASONE  30 tablet  1  . prochlorperazine (COMPAZINE) 10 MG tablet Take 1 tablet (10 mg total) by mouth every 6 (six) hours as needed.  30 tablet  3   No current facility-administered medications for this visit.   Facility-Administered Medications Ordered in Other Visits  Medication Dose Route Frequency Provider Last Rate Last Dose  . heparin lock flush 100 unit/mL  500 Units Intravenous Once Lowella Dell, MD      . sodium chloride 0.9 % injection 10 mL  10 mL Intravenous PRN Lowella Dell, MD          Objective: Middle-aged woman examined lying down on the examination table   Filed Vitals:   06/15/13 1416  BP: 95/65  Pulse: 92  Temp: 97.3 F (36.3 C)  Resp: 18  Body mass index is 19.15 kg/(m^2).  ECOG: 3 Filed  Weights   06/15/13 1416  Weight: 111 lb 9.6 oz (50.621 kg)   note that her weight was 129 pounds 2 months ago  Sclerae unicteric, pupils equal and reactive Oropharynx clear and slightly dry-- no thrush No cervical or supraclavicular adenopathy Lungs no rales or rhonchi, auscultated anterolaterally Heart regular rate and rhythm Abd soft, tenderness to mild to moderate palpation in the right upper quadrant, positive bowel sounds MSK no upper extremity lymphedema Neuro: nonfocal, well oriented, appears exhausted Breasts: Deferred  LAB RESULTS:  Lab Results  Component Value Date   WBC 3.8* 05/17/2013   NEUTROABS 3.1 05/17/2013   HGB 9.5* 05/17/2013   HCT 28.5* 05/17/2013   MCV 89.8 05/17/2013   PLT 123* 05/17/2013      Chemistry      Component Value Date/Time   NA 139 05/17/2013 1353   NA 134* 03/18/2013 0940   NA 142 11/05/2010 1036   K 3.5 05/17/2013 1353   K 3.6 03/18/2013 0940   K 4.5 11/05/2010 1036   CL 100 03/18/2013 0940   CL 99 11/30/2012 0835   CL 99 11/05/2010 1036   CO2 24 05/17/2013 1353   CO2 24 03/18/2013 0940   CO2 26 11/05/2010 1036   BUN 20.1 05/17/2013 1353   BUN 25* 03/18/2013 0940   BUN 14 11/05/2010 1036   CREATININE 1.7* 05/17/2013 1353   CREATININE 1.87* 03/18/2013 0940  CREATININE 0.8 11/05/2010 1036      Component Value Date/Time   CALCIUM 10.2 05/17/2013 1353   CALCIUM 8.2* 03/18/2013 0940   CALCIUM 8.8 11/05/2010 1036   ALKPHOS 220* 05/17/2013 1353   ALKPHOS 88 01/08/2012 0916   ALKPHOS 106* 11/05/2010 1036   AST 66* 05/17/2013 1353   AST 24 01/08/2012 0916   AST 27 11/05/2010 1036   ALT 35 05/17/2013 1353   ALT 27 01/08/2012 0916   ALT 35 11/05/2010 1036   BILITOT 0.89 05/17/2013 1353   BILITOT 0.9 01/08/2012 0916   BILITOT 0.80 11/05/2010 1036        STUDIES: Mr Liver Wo Contrast  06/14/2013   CLINICAL DATA:  Metastatic breast cancer. Liver metastasis and retroperitoneal metastasis.  EXAM: MR PELVIS LIMITED without CONTRAST  TECHNIQUE: Multiplanar,  multisequence MR imaging was performed without administration of intravenous contrast.  CONTRAST:  No contrast administered due to elevated creatinine  COMPARISON:  PET-CT scan 02/11/2013, MRI 03/25/2013  FINDINGS: Diffuse hepatic metastasis are increased in size compared to prior. Index lesion in the anterior right hepatic lobe measures 52 x 46 mm (image 13, series 3) increased from 30 mm x 31 mm on prior. Lesion in the left lateral hepatic lobe measures 48 x 51 mm compared to 32 x 32 mm on prior (image 18). Tumor burden is now severe with near confluence of many lesions. No biliary duct dilatation. Common bile duct is normal.  The lef tadrenal gland metastasis measures 52 by 39 mm (image 22, series 8) unchanged from 54 x 38 mm on prior remeasured. Periaortic adenopathy is similar. Example lymph node measures 32 mm x 22 mm anterior to the aorta and IVC (image 30, series 8) compared to 33 by 22 mm on prior. No new adenopathy.  The spleen is normal. The right adrenal gland and kidneys are normal. Mild bilateral hydronephrosis with renal stents in place is unchanged.  Lung bases are clear. Heart is grossly normal. No aggressive osseous lesion limited view of the skeleton.  IMPRESSION: 1. Marked progression of hepatic metastasis with interval enlargement of lesions which region year confluence. 2. Stable left adrenal metastasis 3. Stable retroperitoneal nodal metastasis. 4. Stable bilateral hydronephrosis with renal stents.   Electronically Signed   By: Genevive Bi M.D.   On: 06/14/2013 10:28    ASSESSMENT: 62 y.o.  [place] with stage IV breast cancer:  1. 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  2. Status post 4 cycles of adjuvant docetaxel, carboplatin and trastuzumab completed November 2007.  3. Postmastectomy radiation completed February 2008.  4. Anastrozole begun  February 2008 (the trastuzumab also was continued to complete a year), continued until June 2012.  5. 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 with left retrocrural and retroperitoneal adenopathy.   6. On tamoxifen/lapatinib/trastuzumab between June 2012 and October 2012.   7. Ixempra/lapatinib/trastuzumab started October 2012, the Ixempra discontinued in February 2013 due to peripheral neuropathy.   8. Continuing on lapatinib/trastuzumab with the addition of fulvestrant, first injections given on 07/31/2011. Lapatinib given at a dose of 1 g (4 tablets) daily, and trastuzumab given at 8 mg/kg every 4 weeks to coordinate with injection appointments.  9. Discontinued lapatinib and fulvestrant in December 2013.  10. Started q. three-week docetaxel/ trastuzumab/  pertuzumab, first dose given 06/15/2012.  Docetaxel given at a 50% dose reduction beginning with cycle 2 due to poor tolerance and severe neutropenia. Completed 6 cycles as of 09/28/2012.   11. Trastuzumab/pertuzumab continued  to June of 2014, with evidence of disease progression. Letrozole added 10/19/2012, discontinued with resumption of IV chemotherapy in July.  12 Bilateral hydronephrosis noted March 2014, bilateral ureteral stenting performed 09/11/2012 under Dr Brunilda Payor, stents replaced as per routine, most recently 03/15/2013  13Herpes Zoster June 2014, resolved.  14 TDM-1 started 11/30/2012, with 1 dose given.  An echocardiogram on 12/07/2012 showed a reduced ejection fraction of 40%, likely secondary to previous treatment with trastuzumab. Subsequently, TDM-1 was discontinued.  15 Started carboplatin/gemcitabine, both agents given on days one and 8 of each 21 day cycle, first dose on 12/21/2012.  Carboplatin given at an AUC of 2, the gemcitabine at 1000 mg per meter square.  Discontinued  October 2014 witrh evidence of progression  16 Resumed lapatinib August of 2014; most recent echocardiogram 03/26/2013 shows a well preserved ejection fraction.  This is due to be repeated in January 2015.   17. Exemestane and everolimus started October 2014 (the exemestane 03/30/2013, the everolimus 04/07/2013), discontinued 06/15/2013 with disease progression    PLAN:  Rebecca Pugh's situation is difficult. She has had chemotherapy with docetaxel, carboplatin, exam prior, and gemcitabine. She has not received anthracyclines because she is HER-2 positive and in that regard she has received trastuzumab, pertuzumab, lapatinib, and TDM 1. She has had problems tolerating the anti-HER-2 therapy. In terms of antiestrogen she has had anastrozole tamoxifen fulvestrant letrozole but more recently exemestane/ everolimus.  Certainly there are agents she has not had, including Abraxane, Cytoxan, Xeloda, and eribulin. She also had only one dose of TDM 1 because of a drop in her EF. With her very poor functional status at this point, as well as her poor liver and renal function, I think further treatment would only urge her rather than help her and might indeed shorten her life. My recommendation accordingly was that we forgo life-prolonging treatment and opted instead for best supportive care with the help of hospice. This was also Rebecca Pugh's physician, as she felt she could not take any more treatment at this point.  Accordingly we are stopping the everolimus, exemestane, and lapatinib. I am also stopping the prednisone. Instead she will start dexamethasone 4 mg twice daily. As far as the pain is concerned she will start OxyContin 15 mg twice daily and use oxycodone 5 mg as needed for breakthrough pain.   We discussed dietary issues and she understands when the liver is not doing well everything is going to taste bad and chest and have no appetite. I suggested she try some salty foods, some very sweet foods and then try to  add a little bit of vinegar to normal foods and see which one of those days changes is most agreeable to her. She is going to try a little eggnog with Bourbon or a similar boost plus rum preparation in the mid afternoon for extra calories. In any case I am encouraging her to drink about 2 quarts of fluid daily. That is much more important than anything she might be eating. I also suggested she take Reglan 3 times a day before meals.  The pain medicine is going to constipate her. She should be on MiraLAX daily and stool softeners twice daily and then on the third day she has not had a bowel movement use and he can have laxative  or enema that she likes so she does have a bowel movement. If she does not quite know what to do than she should call us.  I am hoping with these changes Rebecca Pugh will actually feel better in the next few days. She already has a living will in place and certainly she should not be resuscitated in case of a terminal event. She is scheduled to see me January 5 and she will keep that appointment if she thinks it would be useful for her. Otherwise she is already scheduled for stent exchange late next week and I could see her that day a distal CHOP. I have suggested her daughter for biggest plan to see her sometime it to late January. Rebecca Pugh will need some orientation and help beyond what I was able to give him today. That hopefully can be provided by hospice and that referral was placed today.  If Rebecca Pugh's situation becomes unsafe or too uncomfortable at home the understands we can always transfer her to [facility] in [place]. Rebecca Pugh and Rebecca Pugh know to call for any problems that may develop before her next visit here.          Lowella Dell, MD  06/15/2013  2:33 PM

## 2013-06-15 NOTE — Telephone Encounter (Signed)
Pt's husband, Gene,called stating concern due to pt's status. Per call he states " she will not call, but [name] told me if she wouldn't call, I should ".  Gene states noted weight lose of 12lbs per his scale. Charon daily food intake is less then 500 calories with fluid intake at " maybe 12 ounces ".  Tamirra's daily activity is primarily getting up to use the bathroom and then she is back in bed.  Per Gene " she just looks terrible and I am concerned that she is getting weaker ".  Return call for Gene is [phone].  This note will be given to MD for review for recommendations.

## 2013-06-21 ENCOUNTER — Other Ambulatory Visit: Payer: Self-pay | Admitting: Urology

## 2013-06-21 ENCOUNTER — Encounter (HOSPITAL_BASED_OUTPATIENT_CLINIC_OR_DEPARTMENT_OTHER): Payer: Self-pay | Admitting: *Deleted

## 2013-06-21 ENCOUNTER — Ambulatory Visit (HOSPITAL_BASED_OUTPATIENT_CLINIC_OR_DEPARTMENT_OTHER): Payer: PRIVATE HEALTH INSURANCE | Admitting: Anesthesiology

## 2013-06-21 ENCOUNTER — Encounter (HOSPITAL_BASED_OUTPATIENT_CLINIC_OR_DEPARTMENT_OTHER): Payer: PRIVATE HEALTH INSURANCE | Admitting: Anesthesiology

## 2013-06-21 ENCOUNTER — Encounter (HOSPITAL_BASED_OUTPATIENT_CLINIC_OR_DEPARTMENT_OTHER)
Admission: RE | Disposition: A | Discharge status: Home / Self Care | Payer: Self-pay | Source: Ambulatory Visit | Attending: Urology

## 2013-06-21 ENCOUNTER — Ambulatory Visit (HOSPITAL_BASED_OUTPATIENT_CLINIC_OR_DEPARTMENT_OTHER)
Admission: RE | Admit: 2013-06-21 | Discharge: 2013-06-22 | Disposition: A | Discharge status: Home / Self Care | Payer: PRIVATE HEALTH INSURANCE | Source: Ambulatory Visit | Attending: Urology | Admitting: Urology

## 2013-06-21 DIAGNOSIS — N1330 Unspecified hydronephrosis: Secondary | ICD-10-CM | POA: Insufficient documentation

## 2013-06-21 DIAGNOSIS — Z87891 Personal history of nicotine dependence: Secondary | ICD-10-CM | POA: Insufficient documentation

## 2013-06-21 DIAGNOSIS — N289 Disorder of kidney and ureter, unspecified: Secondary | ICD-10-CM | POA: Diagnosis present

## 2013-06-21 DIAGNOSIS — R339 Retention of urine, unspecified: Secondary | ICD-10-CM | POA: Insufficient documentation

## 2013-06-21 DIAGNOSIS — C50919 Malignant neoplasm of unspecified site of unspecified female breast: Secondary | ICD-10-CM | POA: Insufficient documentation

## 2013-06-21 DIAGNOSIS — I10 Essential (primary) hypertension: Secondary | ICD-10-CM | POA: Insufficient documentation

## 2013-06-21 DIAGNOSIS — Z79899 Other long term (current) drug therapy: Secondary | ICD-10-CM | POA: Insufficient documentation

## 2013-06-21 HISTORY — PX: CYSTOSCOPY WITH STENT PLACEMENT: SHX5790

## 2013-06-21 LAB — POCT I-STAT, CHEM 8
BUN: 43 mg/dL — ABNORMAL HIGH (ref 6–23)
CALCIUM ION: 1.47 mmol/L — AB (ref 1.13–1.30)
CHLORIDE: 92 meq/L — AB (ref 96–112)
Creatinine, Ser: 8.7 mg/dL — ABNORMAL HIGH (ref 0.50–1.10)
GLUCOSE: 134 mg/dL — AB (ref 70–99)
HCT: 29 % — ABNORMAL LOW (ref 36.0–46.0)
Hemoglobin: 9.9 g/dL — ABNORMAL LOW (ref 12.0–15.0)
Potassium: 4.2 mEq/L (ref 3.7–5.3)
Sodium: 123 mEq/L — ABNORMAL LOW (ref 137–147)
TCO2: 19 mmol/L (ref 0–100)

## 2013-06-21 SURGERY — CYSTOSCOPY, WITH STENT INSERTION
Anesthesia: General | Site: Ureter | Laterality: Bilateral

## 2013-06-21 MED ORDER — ONDANSETRON HCL 4 MG/2ML IJ SOLN
INTRAMUSCULAR | Status: DC | PRN
  Administered 2013-06-21: 4 mg via INTRAVENOUS

## 2013-06-21 MED ORDER — LORAZEPAM 0.5 MG PO TABS
0.5000 mg | ORAL_TABLET | Freq: Two times a day (BID) | ORAL | Status: DC | PRN
  Filled 2013-06-21: qty 1

## 2013-06-21 MED ORDER — PROPOFOL 10 MG/ML IV BOLUS
INTRAVENOUS | Status: DC | PRN
  Administered 2013-06-21: 140 mg via INTRAVENOUS

## 2013-06-21 MED ORDER — OXYBUTYNIN CHLORIDE 5 MG PO TABS
5.0000 mg | ORAL_TABLET | Freq: Three times a day (TID) | ORAL | Status: DC | PRN
Start: 2013-06-21 — End: 2013-06-22
  Administered 2013-06-21: 5 mg via ORAL
  Filled 2013-06-21: qty 1

## 2013-06-21 MED ORDER — ACETAMINOPHEN 325 MG PO TABS
650.0000 mg | ORAL_TABLET | ORAL | Status: DC | PRN
  Filled 2013-06-21: qty 2

## 2013-06-21 MED ORDER — FENTANYL CITRATE 0.05 MG/ML IJ SOLN
INTRAMUSCULAR | Status: DC | PRN
  Administered 2013-06-21: 50 ug via INTRAVENOUS

## 2013-06-21 MED ORDER — DEXAMETHASONE SODIUM PHOSPHATE 4 MG/ML IJ SOLN
INTRAMUSCULAR | Status: DC | PRN
  Administered 2013-06-21: 8 mg via INTRAVENOUS

## 2013-06-21 MED ORDER — LACTATED RINGERS IV SOLN
INTRAVENOUS | Status: DC
  Administered 2013-06-21: 15:00:00 via INTRAVENOUS
  Filled 2013-06-21: qty 1000

## 2013-06-21 MED ORDER — CEFAZOLIN SODIUM 1-5 GM-% IV SOLN
1.0000 g | INTRAVENOUS | Status: AC
  Administered 2013-06-21: 2 g via INTRAVENOUS
  Filled 2013-06-21: qty 50

## 2013-06-21 MED ORDER — GABAPENTIN 300 MG PO CAPS
300.0000 mg | ORAL_CAPSULE | Freq: Every day | ORAL | Status: DC
  Filled 2013-06-21: qty 1

## 2013-06-21 MED ORDER — LACTATED RINGERS IV SOLN
INTRAVENOUS | Status: DC
  Filled 2013-06-21: qty 1000

## 2013-06-21 MED ORDER — MEPERIDINE HCL 25 MG/ML IJ SOLN
6.2500 mg | INTRAMUSCULAR | Status: DC | PRN
  Filled 2013-06-21: qty 1

## 2013-06-21 MED ORDER — OXYCODONE HCL 5 MG PO TABS
5.0000 mg | ORAL_TABLET | ORAL | Status: DC | PRN
  Filled 2013-06-21: qty 1

## 2013-06-21 MED ORDER — FENTANYL CITRATE 0.05 MG/ML IJ SOLN
INTRAMUSCULAR | Status: AC
  Filled 2013-06-21: qty 2

## 2013-06-21 MED ORDER — METOCLOPRAMIDE HCL 5 MG PO TABS
5.0000 mg | ORAL_TABLET | Freq: Three times a day (TID) | ORAL | Status: DC
  Filled 2013-06-21: qty 1

## 2013-06-21 MED ORDER — CEFAZOLIN SODIUM 1-5 GM-% IV SOLN
1.0000 g | INTRAVENOUS | Status: DC
  Filled 2013-06-21: qty 50

## 2013-06-21 MED ORDER — FENTANYL CITRATE 0.05 MG/ML IJ SOLN
INTRAMUSCULAR | Status: AC
  Filled 2013-06-21: qty 4

## 2013-06-21 MED ORDER — VITAMIN D3 25 MCG (1000 UNIT) PO TABS
3000.0000 [IU] | ORAL_TABLET | Freq: Every day | ORAL | Status: DC
  Filled 2013-06-21: qty 3

## 2013-06-21 MED ORDER — OXYCODONE HCL ER 15 MG PO T12A
15.0000 mg | EXTENDED_RELEASE_TABLET | Freq: Two times a day (BID) | ORAL | Status: DC
  Administered 2013-06-21: 15 mg via ORAL
  Filled 2013-06-21: qty 1

## 2013-06-21 MED ORDER — LIDOCAINE HCL (CARDIAC) 20 MG/ML IV SOLN
INTRAVENOUS | Status: DC | PRN
  Administered 2013-06-21: 60 mg via INTRAVENOUS

## 2013-06-21 MED ORDER — ONDANSETRON HCL 8 MG PO TABS
8.0000 mg | ORAL_TABLET | Freq: Two times a day (BID) | ORAL | Status: DC | PRN
  Filled 2013-06-21: qty 1

## 2013-06-21 MED ORDER — LOPERAMIDE HCL 2 MG PO CAPS
2.0000 mg | ORAL_CAPSULE | Freq: Four times a day (QID) | ORAL | Status: DC | PRN
  Filled 2013-06-21: qty 1

## 2013-06-21 MED ORDER — FLUOXETINE HCL 40 MG PO CAPS
40.0000 mg | ORAL_CAPSULE | Freq: Every day | ORAL | Status: DC
Start: 2013-06-21 — End: 2013-06-22

## 2013-06-21 MED ORDER — CEFAZOLIN SODIUM-DEXTROSE 2-3 GM-% IV SOLR
2.0000 g | INTRAVENOUS | Status: DC
  Filled 2013-06-21: qty 50

## 2013-06-21 MED ORDER — MIDAZOLAM HCL 2 MG/2ML IJ SOLN
INTRAMUSCULAR | Status: AC
  Filled 2013-06-21: qty 2

## 2013-06-21 MED ORDER — MIRABEGRON ER 25 MG PO TB24
25.0000 mg | ORAL_TABLET | Freq: Every day | ORAL | Status: DC
  Filled 2013-06-21: qty 1

## 2013-06-21 MED ORDER — PROMETHAZINE HCL 25 MG/ML IJ SOLN
6.2500 mg | INTRAMUSCULAR | Status: DC | PRN
  Filled 2013-06-21: qty 1

## 2013-06-21 MED ORDER — SUCCINYLCHOLINE CHLORIDE 20 MG/ML IJ SOLN
INTRAMUSCULAR | Status: DC | PRN
  Administered 2013-06-21: 100 mg via INTRAVENOUS

## 2013-06-21 MED ORDER — ZOLPIDEM TARTRATE 5 MG PO TABS
ORAL_TABLET | ORAL | Status: AC
  Filled 2013-06-21: qty 1

## 2013-06-21 MED ORDER — MIDAZOLAM HCL 5 MG/5ML IJ SOLN
INTRAMUSCULAR | Status: DC | PRN
  Administered 2013-06-21: 2 mg via INTRAVENOUS

## 2013-06-21 MED ORDER — HYDROMORPHONE HCL PF 1 MG/ML IJ SOLN
0.5000 mg | INTRAMUSCULAR | Status: DC | PRN
  Filled 2013-06-21: qty 1

## 2013-06-21 MED ORDER — CALCIUM CITRATE 950 (200 CA) MG PO TABS
1.0000 | ORAL_TABLET | Freq: Every day | ORAL | Status: DC
  Filled 2013-06-21: qty 1

## 2013-06-21 MED ORDER — OXYBUTYNIN CHLORIDE 5 MG PO TABS
ORAL_TABLET | ORAL | Status: AC
  Filled 2013-06-21: qty 1

## 2013-06-21 MED ORDER — ZOLPIDEM TARTRATE 5 MG PO TABS
5.0000 mg | ORAL_TABLET | Freq: Every evening | ORAL | Status: DC | PRN
  Administered 2013-06-21: 5 mg via ORAL
  Filled 2013-06-21: qty 1

## 2013-06-21 MED ORDER — DEXAMETHASONE 4 MG PO TABS
4.0000 mg | ORAL_TABLET | Freq: Three times a day (TID) | ORAL | Status: DC
  Filled 2013-06-21: qty 1

## 2013-06-21 MED ORDER — SODIUM CHLORIDE 0.9 % IV SOLN
INTRAVENOUS | Status: DC
  Administered 2013-06-21: 21:00:00 via INTRAVENOUS
  Filled 2013-06-21: qty 1000

## 2013-06-21 MED ORDER — ONDANSETRON HCL 4 MG/2ML IJ SOLN
4.0000 mg | INTRAMUSCULAR | Status: DC | PRN
  Filled 2013-06-21: qty 2

## 2013-06-21 MED ORDER — POLYETHYLENE GLYCOL 3350 17 GM/SCOOP PO POWD
1.0000 | Freq: Once | ORAL | Status: DC
  Filled 2013-06-21: qty 255

## 2013-06-21 MED ORDER — PROCHLORPERAZINE MALEATE 10 MG PO TABS
10.0000 mg | ORAL_TABLET | Freq: Four times a day (QID) | ORAL | Status: DC | PRN
  Filled 2013-06-21: qty 1

## 2013-06-21 MED ORDER — STERILE WATER FOR IRRIGATION IR SOLN
Status: DC | PRN
  Administered 2013-06-21: 3000 mL

## 2013-06-21 MED ORDER — FENTANYL CITRATE 0.05 MG/ML IJ SOLN
25.0000 ug | INTRAMUSCULAR | Status: DC | PRN
  Administered 2013-06-21 – 2013-06-22 (×3): 25 ug via INTRAVENOUS
  Filled 2013-06-21: qty 1

## 2013-06-21 MED ORDER — DOCUSATE SODIUM 50 MG PO CAPS
250.0000 mg | ORAL_CAPSULE | Freq: Two times a day (BID) | ORAL | Status: DC
  Administered 2013-06-21: 23:00:00 250 mg via ORAL
  Filled 2013-06-21: qty 1

## 2013-06-21 SURGICAL SUPPLY — 19 items
BAG DRAIN URO-CYSTO SKYTR STRL (DRAIN) ×3 IMPLANT
BAG DRN UROCATH (DRAIN) ×1
CANISTER SUCT LVC 12 LTR MEDI- (MISCELLANEOUS) ×2 IMPLANT
CATH INTERMIT  6FR 70CM (CATHETERS) IMPLANT
CATH URET 5FR 28IN CONE TIP (BALLOONS)
CATH URET 5FR 70CM CONE TIP (BALLOONS) IMPLANT
CLOTH BEACON ORANGE TIMEOUT ST (SAFETY) ×3 IMPLANT
DRAPE CAMERA CLOSED 9X96 (DRAPES) ×2 IMPLANT
GLOVE BIO SURGEON STRL SZ7 (GLOVE) ×3 IMPLANT
GLOVE BIOGEL PI IND STRL 7.5 (GLOVE) IMPLANT
GLOVE BIOGEL PI INDICATOR 7.5 (GLOVE) ×4
GLOVE SURG SS PI 7.5 STRL IVOR (GLOVE) ×2 IMPLANT
GUIDEWIRE 0.038 PTFE COATED (WIRE) ×3 IMPLANT
GUIDEWIRE ANG ZIPWIRE 038X150 (WIRE) IMPLANT
GUIDEWIRE STR DUAL SENSOR (WIRE) ×2 IMPLANT
NS IRRIG 500ML POUR BTL (IV SOLUTION) IMPLANT
PACK CYSTOSCOPY (CUSTOM PROCEDURE TRAY) ×3 IMPLANT
STENT POLARIS LOOP 8FR X 24 CM (STENTS) ×4 IMPLANT
WATER STERILE IRR 3000ML UROMA (IV SOLUTION) ×2 IMPLANT

## 2013-06-21 NOTE — Op Note (Signed)
Rebecca Pugh is a 63 y.o.   06/21/2013  General   Preop diagnosis: Bilateral hydronephrosis. Obstructed double-J stents. Metastatic breast cancer.  Postop diagnosis: Same  Procedure done: Cystoscopy, bilateral double-J stents exchange  Surgeon: Charlene Brooke. [name]  Anesthesia: General  Indication: Patient is a 63 years old female with metastatic breast cancer. She has bilateral double-J stents that were replaced in September. She is scheduled for stents replacement in 4 days. However since last night she has not been able to urinate. Her creatinine was 2.9 on 06/16/2013 and is today 8.7. Renal ultrasound shows bilateral hydronephrosis. She is scheduled for stents exchange today.  Procedure: Patient was identified by her wrist band and proper timeout was taken.  Under general anesthesia she was prepped and draped and placed in the dorsolithotomy position. A panendoscope was inserted in the bladder. The loops of the Polaris stents were noted in the bladder. The loops of the right stent were grasped with a grasping forceps and pulled through the meatus. A sensor wire was then passed through the stent and advanced all the way up into the renal pelvis. The double-J stent was then removed. A #8 French - 24 Polaris stent was passed over the sensor wire. The proximal curl of the double-J stent is in the renal pelvis and the the distal loops are in the bladder.  The same procedure was carried out on the left side. The loops of the stent were pulled out through the urethral meatus. A sensor wire was passed through the double-J stent. The left stent was encrustated but the the sensor wire was advanced through the stent altered the renal pelvis and the stent removed. A #8 French-24 Polaris stent was passed over the sensor wire. The proximal curl of the double-J stent is in the renal pelvis and the loops of the stent are in the bladder.  The cystoscope was removed.  Patient tolerated the procedure well and left  the OR in satisfactory condition to postanesthesia care unit.  CC: Dr. Sarajane Jews Magrinat  EBL:   Needles, sponges count:

## 2013-06-21 NOTE — Anesthesia Postprocedure Evaluation (Signed)
Anesthesia Post Note  Patient: Rebecca Pugh  Procedure(s) Performed: Procedure(s) (LRB): CYSTOSCOPY WITH STENT REPLACEMENT (Bilateral)  Anesthesia type: General  Patient location: PACU  Post pain: Pain level controlled  Post assessment: Post-op Vital signs reviewed  Last Vitals: BP 141/84  Pulse 81  Temp(Src) 36.5 C (Oral)  Resp 17  Wt 111 lb (50.349 kg)  SpO2 100%  Post vital signs: Reviewed  Level of consciousness: sedated  Complications: No apparent anesthesia complications

## 2013-06-21 NOTE — Anesthesia Preprocedure Evaluation (Signed)
Anesthesia Evaluation  Patient identified by MRN, date of birth, ID band Patient awake  General Assessment Comment:  Hypertension     .  Depression     .  Hydronephrosis, bilateral     .  Breast cancer metastasized to multiple sites     Reviewed: Allergy & Precautions, H&P , NPO status , Patient's Chart, lab work & pertinent test results  Airway Mallampati: II TM Distance: >3 FB Neck ROM: Full    Dental no notable dental hx.    Pulmonary neg pulmonary ROS, former smoker,  breath sounds clear to auscultation  Pulmonary exam normal       Cardiovascular hypertension, negative cardio ROS  Rhythm:Regular Rate:Normal     Neuro/Psych PSYCHIATRIC DISORDERS Anxiety Depression negative neurological ROS     GI/Hepatic negative GI ROS, Neg liver ROS,   Endo/Other  negative endocrine ROS  Renal/GU Renal InsufficiencyRenal disease  negative genitourinary   Musculoskeletal negative musculoskeletal ROS (+)   Abdominal   Peds negative pediatric ROS (+)  Hematology negative hematology ROS (+)   Anesthesia Other Findings   Reproductive/Obstetrics negative OB ROS                           Anesthesia Physical  Anesthesia Plan  ASA: III  Anesthesia Plan: General   Post-op Pain Management:    Induction: Rapid sequence and Cricoid pressure planned  Airway Management Planned: Oral ETT  Additional Equipment:   Intra-op Plan:   Post-operative Plan: Extubation in OR  Informed Consent: I have reviewed the patients History and Physical, chart, labs and discussed the procedure including the risks, benefits and alternatives for the proposed anesthesia with the patient or authorized representative who has indicated his/her understanding and acceptance.   Dental advisory given  Plan Discussed with:   Anesthesia Plan Comments: (Last ate at 0830 but vomited most of it up. Needs RSI)         Anesthesia Quick Evaluation

## 2013-06-21 NOTE — Procedures (Signed)
Patient refuses to eat anything including soup

## 2013-06-21 NOTE — Transfer of Care (Signed)
Immediate Anesthesia Transfer of Care Note  Patient: Rebecca Pugh  Procedure(s) Performed: Procedure(s): CYSTOSCOPY WITH STENT REPLACEMENT (Bilateral)  Patient Location: PACU  Anesthesia Type:General  Level of Consciousness: sedated  Airway & Oxygen Therapy: Patient Spontanous Breathing and Patient connected to nasal cannula oxygen  Post-op Assessment: Report given to PACU RN, Post -op Vital signs reviewed and stable and Patient moving all extremities  Post vital signs: Reviewed and stable  Complications: No apparent anesthesia complications

## 2013-06-21 NOTE — Progress Notes (Signed)
Patient's husband gave her all her night medication including ativan and oxycontin

## 2013-06-21 NOTE — Anesthesia Procedure Notes (Signed)
Procedure Name: Intubation Date/Time: 06/21/2013 4:03 PM Performed by: Denna Haggard D Pre-anesthesia Checklist: Patient identified, Emergency Drugs available, Suction available and Patient being monitored Patient Re-evaluated:Patient Re-evaluated prior to inductionOxygen Delivery Method: Circle System Utilized Preoxygenation: Pre-oxygenation with 100% oxygen Intubation Type: IV induction Ventilation: Mask ventilation without difficulty Laryngoscope Size: Mac and 3 Grade View: Grade I Tube type: Oral Tube size: 7.0 mm Number of attempts: 1 Airway Equipment and Method: stylet and oral airway Placement Confirmation: ETT inserted through vocal cords under direct vision,  positive ETCO2 and breath sounds checked- equal and bilateral Secured at: 21 cm Tube secured with: Tape Dental Injury: Teeth and Oropharynx as per pre-operative assessment

## 2013-06-21 NOTE — H&P (Signed)
History of Present Illness    [name] has bilateral JJ stents for bilateral hydronephrosis secondary to metastatic breast cancer. She is scheduled for a stent exchange on Friday.  She has now discontinued her cancer treatment and is on hospice care.    Interval history: [name] presents today as she has been unable to void since yesterday evening. She reportedly drank about 40 oz yesterday and only had two or three small voids throughout the day. She had about 25 oz to drink this morning and has not been able to void. Nothing makes this better or worse. She currently has an urge to void. She denies any other associated symptoms. She has recently started taking OxyContin. Her bowels are regular however.   Surgical History Problems  1. History of Ankle Repair 2. History of Breast Surgery Mastectomy 3. History of Cholecystectomy 4. History of Cystoscopy With Insertion Of Ureteral Stent Bilateral 5. History of Cystoscopy With Insertion Of Ureteral Stent Bilateral 6. History of Cystoscopy With Insertion Of Ureteral Stent Bilateral 7. History of Cystoscopy With Insertion Of Ureteral Stent Bilateral 8. History of Tonsillectomy  Current Meds 1. Compazine TABS (Prochlorperazine Maleate);  Therapy: (Recorded:05Jan2015) to Recorded 2. Decadron 4 MG TABS;  Therapy: (Recorded:20Jun2014) to Recorded 3. LORazepam 0.5 MG Oral Tablet;  Therapy: (Recorded:20Mar2014) to Recorded 4. Myrbetriq 25 MG Oral Tablet Extended Release 24 Hour; Take 1 tablet daily;  Therapy: [id number] to (Evaluate:09May2015)  Requested for: [id number]; Last  Rx:10Nov2014 Ordered 5. OxyCODONE HCl - 5 MG Oral Capsule;  Therapy: (Recorded:20Jun2014) to Recorded 6. OxyCONTIN 15 MG TB12;  Therapy: (JJHERDEY:[id number]) to Recorded 7. PriLOSEC OTC 20 MG Oral Tablet Delayed Release;  Therapy: (Recorded:20Jun2014) to Recorded 8. PROzac CAPS (FLUoxetine HCl);  Therapy: (Recorded:20Mar2014) to Recorded 9. Reglan 10 MG Oral  Tablet (Metoclopramide HCl);  Therapy: (Recorded:20Jun2014) to Recorded  Allergies Medication  1. No Known Drug Allergies  Family History Problems  1. Family history of Family Health Status Number Of Children   1 son/ 1 daughter 2. Family history of Father Deceased At Age ____   lung ca 3. Family history of Mother Deceased At Age 91   COPD  Social History Problems  1. Denied: History of Alcohol Use 2. Caffeine Use   2 3. Marital History - Currently Married 4. Retired From Work 5. Tobacco use (305.1)   Smoked 1/2 pk for 16 yrs // quit 22 yrs ago  Review of Systems Genitourinary, constitutional and gastrointestinal system(s) were reviewed and pertinent findings if present are noted.  Genitourinary: incomplete emptying of bladder and initiating urination requires straining.  Gastrointestinal: abdominal pain.  Constitutional: feeling poorly (malaise), feeling tired (fatigue) and recent weight loss.    Physical Exam Constitutional: Well nourished and well developed . No acute distress.  ENT:. The ears and nose are normal in appearance.  Neck: The appearance of the neck is normal.  Pulmonary: No respiratory distress and normal respiratory rhythm and effort.  Cardiovascular: Heart rate and rhythm are normal.  Abdomen: The abdomen is mildly distended. Mild. Tenderness is present diffusely throughout the abdomen.  Genitourinary:  The urethra is normal in appearance.  Skin: no visible rash.  Neuro/Psych:. Mood and affect are appropriate.    Results/Data  The following images/tracing/specimen were independently visualized: . renal u/s: I have reviewed the films (report still pending). There is evidence of moderate bilateral hydronephrosis. What appears to be the bladder shows 280 cc. Stents not visualized.    Assessment Assessed  1. Incomplete bladder emptying (788.21) 2. Hydronephrosis,  left (591) 3. Hydronephrosis, right (591)  An attempt was made to catheterize Ms.  Wiker as her initial PVR showed 600+ cc. The catheter was inserted, but urine was not able to be obtained. She said she felt relief from this even though no urine was obtained. After discussing these findings with Dr. Janice Norrie, we ordered a stat renal u/s and BUN/creatinine. The ultrasound showed bilateral hydronephrosis. What appears to be the bladder showed a residual of about 280 cc. Unfortunately her lab work returned and showed a BUN of 42 and cr of 8.9. Dr. Janice Norrie came to the room to talk to her and she is going to be taken down to [facility] for a bilateral stent exchange to be performed in the next hour. The last time she had anything to eat or drink was 8 am.   Plan Incomplete bladder emptying  1. BUN & CREATININE; Status:Complete;   Done: [id number] 12:24PM 2. RENAL U/S COMPLETE; Status:Active; Requested GXQ:[id number];  3. VENIPUNCTURE; Status:Complete;   Done: [id number]  1. Cysto/ bilateral stent exchange today

## 2013-06-22 ENCOUNTER — Ambulatory Visit: Payer: Medicare Other | Admitting: Oncology

## 2013-06-22 LAB — BASIC METABOLIC PANEL
BUN: 50 mg/dL — ABNORMAL HIGH (ref 6–23)
CO2: 18 mEq/L — ABNORMAL LOW (ref 19–32)
Calcium: 10.5 mg/dL (ref 8.4–10.5)
Chloride: 87 mEq/L — ABNORMAL LOW (ref 96–112)
Creatinine, Ser: 7.44 mg/dL — ABNORMAL HIGH (ref 0.50–1.10)
GFR calc Af Amer: 6 mL/min — ABNORMAL LOW (ref >90)
GFR, EST NON AFRICAN AMERICAN: 5 mL/min — AB (ref >90)
Glucose, Bld: 87 mg/dL (ref 70–99)
POTASSIUM: 4.9 meq/L (ref 3.7–5.3)
SODIUM: 123 meq/L — AB (ref 137–147)

## 2013-06-22 MED ORDER — FENTANYL CITRATE 0.05 MG/ML IJ SOLN
INTRAMUSCULAR | Status: AC
  Filled 2013-06-22: qty 2

## 2013-06-22 NOTE — Progress Notes (Addendum)
Pt restless and having difficulty sleeping.  Pt c/o need to urinate.  Explained catheter use, adjusted strap on leg and observed urine flow to ensure that catheter was draining properly. UOP decreased, however.   Assisted pt to BR x 2 during night per pt's request. Pt passing small amt of gas.  Fentanyl 57mcg given x 2 to ease pt's discomfort.  Pt resting in bed presently.

## 2013-06-22 NOTE — Discharge Summary (Signed)
Physician Discharge Summary  Patient ID: Rebecca Pugh MRN: [id number] DOB/AGE: [birth year] 63 y.o.  Admit date: 06/21/2013 Discharge date: 06/22/2013  Admission Diagnoses: Bilateral hydronephrosis.  Metastatic breast cancer.  Renal insufficiency  Discharge Diagnoses:  Active Problems:   Renal insufficiency Bilateral hydronephrosis.  Obstructed JJ stents.  Metastatic breast cancer  Discharged Condition: Improved  Hospital Course: H/o bilateral hydronephrosis secondary to metastatic breast cancer. No urinary output since Sunday night.  Creatinine went up to 8.7.  JJ stents were replaced.  Creatinine is down to 7.4.  Urinary output: 350 ml.  Treatments: Cystoscopy, bilateral JJ stents exchange  Discharge Exam: Blood pressure 139/85, pulse 81, temperature 96.9 F (36.1 C), temperature source Oral, resp. rate 16, weight 50.349 kg (111 lb), SpO2 97.00%. Alert and oriented.  No abdominal pain.  Urine clear.  Disposition: 01-Home or Self Care   Future Appointments Provider Department Dept Phone   06/22/2013 11:30 AM Chauncey Cruel, MD [facility] [phone]       Medication List         calcium citrate 950 MG tablet  Commonly known as:  CALCITRATE - dosed in mg elemental calcium  Take 1 tablet by mouth daily.     cholecalciferol 1000 UNITS tablet  Commonly known as:  VITAMIN D  Take 3,000 Units by mouth daily.     dexamethasone 4 MG tablet  Commonly known as:  DECADRON  Take 1 tablet (4 mg total) by mouth 3 (three) times daily.     docusate sodium 250 MG capsule  Commonly known as:  COLACE  Take 1 capsule (250 mg total) by mouth 2 (two) times daily.     FLUoxetine 40 MG capsule  Commonly known as:  PROZAC  TAKE ONE CAPSULE BY MOUTH ONCE DAILY     gabapentin 300 MG capsule  Commonly known as:  NEURONTIN  Take 1 capsule (300 mg total) by mouth at bedtime.     loperamide 2 MG capsule  Commonly known as:  IMODIUM  Take 2 mg by mouth 4  (four) times daily as needed. diarrhea     LORazepam 0.5 MG tablet  Commonly known as:  ATIVAN  Take 1 tablet (0.5 mg total) by mouth 2 (two) times daily as needed.     metoCLOPramide 10 MG tablet  Commonly known as:  REGLAN  Take 0.5 tablets (5 mg total) by mouth 4 (four) times daily -  before meals and at bedtime.     mirabegron ER 25 MG Tb24 tablet  Commonly known as:  MYRBETRIQ  Take 1 tablet (25 mg total) by mouth daily.     omeprazole 40 MG capsule  Commonly known as:  PRILOSEC  Take 1 capsule (40 mg total) by mouth at bedtime.     ondansetron 8 MG tablet  Commonly known as:  ZOFRAN  Take 8 mg by mouth 2 (two) times daily as needed for nausea.     oxyCODONE 5 MG immediate release tablet  Commonly known as:  Oxy IR/ROXICODONE  Take 1 tablet (5 mg total) by mouth every 4 (four) hours as needed for moderate pain or severe pain.     OxyCODONE 15 mg T12a 12 hr tablet  Commonly known as:  OXYCONTIN  Take 1 tablet (15 mg total) by mouth every 12 (twelve) hours.     polyethylene glycol powder powder  Commonly known as:  MIRALAX  Take 255 g (1 Container total) by mouth once.     prochlorperazine  10 MG tablet  Commonly known as:  COMPAZINE  Take 1 tablet (10 mg total) by mouth every 6 (six) hours as needed.         Signed: Damari Suastegui-HENRY 06/22/2013, 9:13 AM

## 2013-06-22 NOTE — Progress Notes (Signed)
Dr. Janice Norrie into see, okay to be discharged home, does not have to stay to void.

## 2013-06-22 NOTE — Discharge Instructions (Addendum)
Post Anesthesia Home Care Instructions  Activity: Get plenty of rest for the remainder of the day. A responsible adult should stay with you for 24 hours following the procedure.  For the next 24 hours, DO NOT: -Drive a car -Operate machinery -Drink alcoholic beverages -Take any medication unless instructed by your physician -Make any legal decisions or sign important papers.  Meals: Start with liquid foods such as gelatin or soup. Progress to regular foods as tolerated. Avoid greasy, spicy, heavy foods. If nausea and/or vomiting occur, drink only clear liquids until the nausea and/or vomiting subsides. Call your physician if vomiting continues.  Special Instructions/Symptoms: Your throat may feel dry or sore from the anesthesia or the breathing tube placed in your throat during surgery. If this causes discomfort, gargle with warm salt water. The discomfort should disappear within 24 hours.      [facility] [phone] Post Ureteroscopy With or Without Stent Instructions  Definitions:  Ureter: The duct that transports urine from the kidney to the bladder. Stent:   A plastic hollow tube that is placed into the ureter, from the kidney to the bladder to prevent the ureter from swelling shut.  GENERAL INSTRUCTIONS:  Despite the fact that no skin incisions were used, the area around the ureter and bladder is raw and irritated. The stent is a foreign body which will further irritate the bladder wall. This irritation is manifested by increased frequency of urination, both day and night, and by an increase in the urge to urinate. In some, the urge to urinate is present almost always. Sometimes the urge is strong enough that you may not be able to stop yourself from urinating. The only real cure is to remove the stent and then give time for the bladder wall to heal which can't be done until the danger of the ureter swelling shut has passed, which varies.  You may see some  blood in your urine while the stent is in place and a few days afterwards. Do not be alarmed, even if the urine was clear for a while. Get off your feet and drink lots of fluids until clearing occurs. If you start to pass clots or don't improve, call us.  DIET: You may return to your normal diet immediately. Because of the raw surface of your bladder, alcohol, spicy foods, acid type foods and drinks with caffeine may cause irritation or frequency and should be used in moderation. To keep your urine flowing freely and to avoid constipation, drink plenty of fluids during the day ( 8-10 glasses ). Tip: Avoid cranberry juice because it is very acidic.  ACTIVITY: Your physical activity doesn't need to be restricted. However, if you are very active, you may see some blood in your urine. We suggest that you reduce your activity under these circumstances until the bleeding has stopped.  BOWELS: It is important to keep your bowels regular during the postoperative period. Straining with bowel movements can cause bleeding. A bowel movement every other day is reasonable. Use a mild laxative if needed, such as Milk of Magnesia 2-3 tablespoons, or 2 Dulcolax tablets. Call if you continue to have problems. If you have been taking narcotics for pain, before, during or after your surgery, you may be constipated. Take a laxative if necessary.   MEDICATION: You should resume your pre-surgery medications unless told not to. In addition you will often be given an antibiotic to prevent infection. These should be taken as prescribed until the bottles are finished unless   you are having an unusual reaction to one of the drugs.  PROBLEMS YOU SHOULD REPORT TO US: Fevers over 100.5 Fahrenheit. Heavy bleeding, or clots ( See above notes about blood in urine ). Inability to urinate. Drug reactions ( hives, rash, nausea, vomiting, diarrhea ). Severe burning or pain with urination that is not improving.  FOLLOW-UP: You will  need a follow-up appointment to monitor your progress. Call for this appointment at the number listed above. Usually the first appointment will be about three to fourteen days after your surgery.      

## 2013-06-23 ENCOUNTER — Encounter (HOSPITAL_BASED_OUTPATIENT_CLINIC_OR_DEPARTMENT_OTHER): Payer: Self-pay | Admitting: Urology

## 2013-06-25 ENCOUNTER — Ambulatory Visit (HOSPITAL_BASED_OUTPATIENT_CLINIC_OR_DEPARTMENT_OTHER): Admission: RE | Admit: 2013-06-25 | Payer: Medicare Other | Source: Ambulatory Visit | Admitting: Urology

## 2013-06-25 ENCOUNTER — Encounter (HOSPITAL_BASED_OUTPATIENT_CLINIC_OR_DEPARTMENT_OTHER): Admission: RE | Payer: Self-pay | Source: Ambulatory Visit

## 2013-06-25 ENCOUNTER — Other Ambulatory Visit: Payer: Self-pay | Admitting: Oncology

## 2013-06-25 SURGERY — CYSTOSCOPY, FLEXIBLE, WITH STENT REPLACEMENT
Anesthesia: General | Laterality: Bilateral

## 2013-06-28 ENCOUNTER — Telehealth: Payer: Self-pay | Admitting: Oncology

## 2013-06-28 NOTE — Telephone Encounter (Signed)
s/w pt dtr re appt for 1/29 @ 2:30pm.

## 2013-07-02 ENCOUNTER — Telehealth: Payer: Self-pay | Admitting: *Deleted

## 2013-07-12 ENCOUNTER — Other Ambulatory Visit: Payer: Self-pay | Admitting: Oncology

## 2013-07-15 ENCOUNTER — Ambulatory Visit: Payer: Medicare Other | Admitting: Oncology

## 2013-07-18 NOTE — Telephone Encounter (Signed)
This RN was called by [place] wanting to inform MD of current pt status due to noted decline.  Pt's symptoms are being managed well.  Pt is not very arousable and is having terminal agitation which is being managed by Dr Deatra Ina per hospice in [place].  Situation is hard on husband, he inquired this AM about benefit if pt was given IVF which Maudie Mercury discussed at this stage would probably cause more

## 2013-07-18 DEATH — deceased

## 2014-01-04 IMAGING — PT NM PET TUM IMG RESTAG (PS) SKULL BASE T - THIGH
1 of 6 series · 1 of 25 positions shown · non-contrast
Comparison: 11/17/2012

CLINICAL DATA: Subsequent treatment strategy for metastatic breast
cancer.

NUCLEAR MEDICINE PET SKULL BASE TO THIGH
Fasting Blood Glucose:  87
TECHNIQUE: 19.2 mCi F-18 FDG was injected intravenously. CT data
was obtained and used for attenuation correction and anatomic
localization only.  (This was not acquired as a diagnostic CT
examination.) Additional exam technical data entered on
technologist worksheet.

[Series 2: ct images · axial · 3.8mm · 0.98mm/px · 1 of 267 slices shown]
[im 267/267  brain]
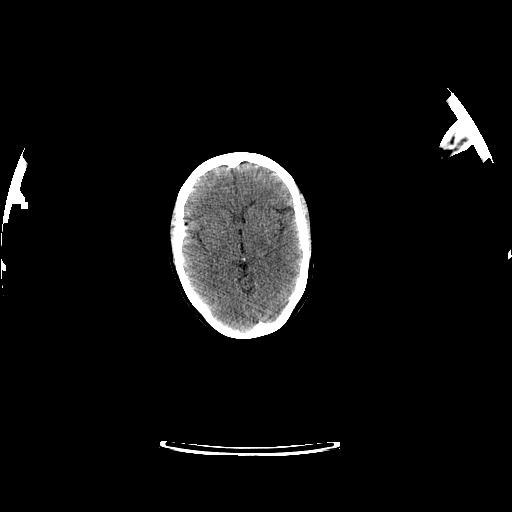

[1 of 25 positions shown; findings below may reference images not displayed]

FINDINGS: Neck: No hypermetabolic lymph nodes in the neck.

Small left supraclavicular nodes measuring up to 9 mm short axis
(series 2/image 45), improved, max SUV 2.4 (previously max SUV
4.8).

Chest:  Status post left mastectomy with left axillary lymph node
dissection.

Mild hypermetabolism in the medial left chest wall, max SUV
(PET image 63), without definite CT correlate.

No hypermetabolic mediastinal or hilar nodes.

Scarring/radiation changes in the anterior left upper lobe.  No
suspicious pulmonary nodules on the CT scan.

Right chest port.

Abdomen/Pelvis:  Widespread hepatic metastases, many of which are
new.  Representative lesions include:
--2.6 x 2.5 cm lesion in the anterior right hepatic dome (series
2/image 109) with max SUV 9.5, new
--2.7 x 1.9 cm lesion in the lateral segment left hepatic lobe
(series 2/image 111), max SUV 7.8, unchanged in size with prior max
SUV
3.0 x 1.9 cm lesion in the medial segment left hepatic lobe (series
2/image 123) with max SUV 9.5, new

No abnormal hypermetabolic activity within the pancreas, right
adrenal gland, or spleen.

5.9 x 5.1 cm left adrenal mass (series 2/image 131) with max SUV
11.1, previously 6.8 x 5.3 cm with max SUV 12.6.

Retroperitoneal nodal metastases, including:

--3.2 x 5.3 cm right para-aortic node (series 2/image 144) with max
SUV 9.6, previously 3.7 x 6.9 cm with max SUV
--3.3 x 4.5 cm left para-aortic nodal mass (series 2/image 158)
with max SUV 11.1, previously 6.6 x 3.8 cm with max SUV

7 mm short axis right inguinal node (series 2/image 220),
decreased, now non-FDG-avid with max SUV 1.4 (previously max SUV
7.5).

Bilateral double pigtail ureteral stents in satisfactory position.
No hydronephrosis.

Skeleton:  No focal hypermetabolic activity to suggest skeletal
metastasis.
IMPRESSION: Mixed response to therapy.

Progression of hepatic metastases, with index lesions as described
above, max SUV 9.5.

Nodal metastases in the left supraclavicular region,
retroperitoneum, and right inguinal region are improved.

Mild hypermetabolism in the medial left chest wall, max SUV 4.5,
without definite CT correlate.

## 2014-02-11 ENCOUNTER — Other Ambulatory Visit: Payer: Self-pay | Admitting: *Deleted
